# Patient Record
Sex: Female | Born: 1950 | Race: White | Hispanic: Yes | Marital: Single | Smoking: Never smoker
Health system: Southern US, Community
[De-identification: ages and names within clinical notes are randomized; demographics above are authoritative.]

## PROBLEM LIST (undated history)

## (undated) DIAGNOSIS — R7303 Prediabetes: Secondary | ICD-10-CM

## (undated) DIAGNOSIS — H269 Unspecified cataract: Secondary | ICD-10-CM

## (undated) DIAGNOSIS — E7800 Pure hypercholesterolemia, unspecified: Secondary | ICD-10-CM

## (undated) DIAGNOSIS — G4733 Obstructive sleep apnea (adult) (pediatric): Secondary | ICD-10-CM

## (undated) DIAGNOSIS — K5909 Other constipation: Secondary | ICD-10-CM

## (undated) DIAGNOSIS — K648 Other hemorrhoids: Secondary | ICD-10-CM

## (undated) DIAGNOSIS — K5790 Diverticulosis of intestine, part unspecified, without perforation or abscess without bleeding: Secondary | ICD-10-CM

## (undated) DIAGNOSIS — K759 Inflammatory liver disease, unspecified: Secondary | ICD-10-CM

## (undated) DIAGNOSIS — K5792 Diverticulitis of intestine, part unspecified, without perforation or abscess without bleeding: Secondary | ICD-10-CM

## (undated) DIAGNOSIS — K449 Diaphragmatic hernia without obstruction or gangrene: Secondary | ICD-10-CM

## (undated) DIAGNOSIS — K760 Fatty (change of) liver, not elsewhere classified: Secondary | ICD-10-CM

## (undated) DIAGNOSIS — K279 Peptic ulcer, site unspecified, unspecified as acute or chronic, without hemorrhage or perforation: Secondary | ICD-10-CM

## (undated) DIAGNOSIS — I10 Essential (primary) hypertension: Secondary | ICD-10-CM

## (undated) DIAGNOSIS — K219 Gastro-esophageal reflux disease without esophagitis: Secondary | ICD-10-CM

## (undated) DIAGNOSIS — G4730 Sleep apnea, unspecified: Secondary | ICD-10-CM

## (undated) DIAGNOSIS — M1990 Unspecified osteoarthritis, unspecified site: Secondary | ICD-10-CM

## (undated) DIAGNOSIS — K626 Ulcer of anus and rectum: Secondary | ICD-10-CM

## (undated) HISTORY — DX: Sleep apnea, unspecified: G47.30

## (undated) HISTORY — DX: Diverticulosis of intestine, part unspecified, without perforation or abscess without bleeding: K57.90

## (undated) HISTORY — DX: Obstructive sleep apnea (adult) (pediatric): G47.33

## (undated) HISTORY — DX: Fatty (change of) liver, not elsewhere classified: K76.0

## (undated) HISTORY — PX: ABDOMINAL HYSTERECTOMY: SHX81

## (undated) HISTORY — DX: Peptic ulcer, site unspecified, unspecified as acute or chronic, without hemorrhage or perforation: K27.9

## (undated) HISTORY — DX: Other constipation: K59.09

## (undated) HISTORY — DX: Unspecified cataract: H26.9

## (undated) HISTORY — PX: HEMORRHOID BANDING: SHX5850

## (undated) HISTORY — DX: Diverticulitis of intestine, part unspecified, without perforation or abscess without bleeding: K57.92

## (undated) HISTORY — DX: Gastro-esophageal reflux disease without esophagitis: K21.9

## (undated) HISTORY — DX: Diaphragmatic hernia without obstruction or gangrene: K44.9

## (undated) HISTORY — DX: Other hemorrhoids: K64.8

## (undated) HISTORY — DX: Unspecified osteoarthritis, unspecified site: M19.90

## (undated) HISTORY — DX: Ulcer of anus and rectum: K62.6

## (undated) HISTORY — PX: TUBAL LIGATION: SHX77

---

## 2003-10-01 ENCOUNTER — Ambulatory Visit (HOSPITAL_COMMUNITY): Admission: RE | Admit: 2003-10-01 | Discharge: 2003-10-01 | Payer: Self-pay | Admitting: Family Medicine

## 2003-10-14 ENCOUNTER — Ambulatory Visit: Payer: Self-pay | Admitting: Family Medicine

## 2003-10-15 ENCOUNTER — Ambulatory Visit: Payer: Self-pay | Admitting: *Deleted

## 2003-10-29 ENCOUNTER — Ambulatory Visit: Payer: Self-pay | Admitting: Family Medicine

## 2003-11-04 ENCOUNTER — Ambulatory Visit (HOSPITAL_COMMUNITY): Admission: RE | Admit: 2003-11-04 | Discharge: 2003-11-04 | Payer: Self-pay | Admitting: Family Medicine

## 2003-12-11 ENCOUNTER — Ambulatory Visit: Payer: Self-pay | Admitting: Family Medicine

## 2003-12-18 ENCOUNTER — Ambulatory Visit: Payer: Self-pay | Admitting: Family Medicine

## 2003-12-18 ENCOUNTER — Ambulatory Visit (HOSPITAL_COMMUNITY): Admission: RE | Admit: 2003-12-18 | Discharge: 2003-12-18 | Payer: Self-pay | Admitting: Family Medicine

## 2004-01-18 ENCOUNTER — Ambulatory Visit: Payer: Self-pay | Admitting: Family Medicine

## 2004-03-21 ENCOUNTER — Ambulatory Visit: Payer: Self-pay | Admitting: Family Medicine

## 2004-04-18 ENCOUNTER — Ambulatory Visit: Payer: Self-pay | Admitting: Family Medicine

## 2004-04-27 ENCOUNTER — Ambulatory Visit: Payer: Self-pay | Admitting: Family Medicine

## 2004-12-14 ENCOUNTER — Ambulatory Visit: Payer: Self-pay | Admitting: Internal Medicine

## 2005-01-01 ENCOUNTER — Emergency Department (HOSPITAL_COMMUNITY): Admission: EM | Admit: 2005-01-01 | Discharge: 2005-01-01 | Payer: Self-pay | Admitting: Emergency Medicine

## 2005-02-03 ENCOUNTER — Ambulatory Visit (HOSPITAL_COMMUNITY): Admission: RE | Admit: 2005-02-03 | Discharge: 2005-02-03 | Payer: Self-pay | Admitting: Family Medicine

## 2005-05-09 ENCOUNTER — Ambulatory Visit: Payer: Self-pay | Admitting: Family Medicine

## 2005-05-09 ENCOUNTER — Encounter (INDEPENDENT_AMBULATORY_CARE_PROVIDER_SITE_OTHER): Payer: Self-pay | Admitting: Family Medicine

## 2005-05-09 LAB — CONVERTED CEMR LAB: Pap Smear: NORMAL

## 2005-08-08 ENCOUNTER — Ambulatory Visit: Payer: Self-pay | Admitting: Family Medicine

## 2005-09-14 ENCOUNTER — Ambulatory Visit: Payer: Self-pay | Admitting: *Deleted

## 2005-12-05 ENCOUNTER — Ambulatory Visit: Payer: Self-pay | Admitting: Family Medicine

## 2006-01-26 ENCOUNTER — Ambulatory Visit: Payer: Self-pay | Admitting: Family Medicine

## 2006-03-09 ENCOUNTER — Ambulatory Visit: Payer: Self-pay | Admitting: Family Medicine

## 2006-05-19 ENCOUNTER — Emergency Department (HOSPITAL_COMMUNITY): Admission: EM | Admit: 2006-05-19 | Discharge: 2006-05-19 | Payer: Self-pay | Admitting: Emergency Medicine

## 2006-07-11 ENCOUNTER — Encounter (INDEPENDENT_AMBULATORY_CARE_PROVIDER_SITE_OTHER): Payer: Self-pay | Admitting: Family Medicine

## 2006-07-11 DIAGNOSIS — Z8619 Personal history of other infectious and parasitic diseases: Secondary | ICD-10-CM | POA: Insufficient documentation

## 2006-07-11 DIAGNOSIS — K219 Gastro-esophageal reflux disease without esophagitis: Secondary | ICD-10-CM

## 2006-07-11 DIAGNOSIS — E7800 Pure hypercholesterolemia, unspecified: Secondary | ICD-10-CM

## 2006-07-11 DIAGNOSIS — M545 Low back pain: Secondary | ICD-10-CM

## 2006-07-11 DIAGNOSIS — I10 Essential (primary) hypertension: Secondary | ICD-10-CM

## 2006-07-31 ENCOUNTER — Ambulatory Visit: Payer: Self-pay | Admitting: Family Medicine

## 2006-08-08 ENCOUNTER — Ambulatory Visit: Payer: Self-pay | Admitting: Family Medicine

## 2006-10-17 ENCOUNTER — Encounter (INDEPENDENT_AMBULATORY_CARE_PROVIDER_SITE_OTHER): Payer: Self-pay | Admitting: *Deleted

## 2007-01-15 ENCOUNTER — Ambulatory Visit: Payer: Self-pay | Admitting: Family Medicine

## 2007-01-15 LAB — CONVERTED CEMR LAB
ALT: 28 units/L (ref 0–35)
Cholesterol: 201 mg/dL — ABNORMAL HIGH (ref 0–200)
HDL: 54 mg/dL (ref >39)
LDL Cholesterol: 107 mg/dL — ABNORMAL HIGH (ref 0–99)
Total CHOL/HDL Ratio: 3.7
Triglycerides: 198 mg/dL — ABNORMAL HIGH (ref <150)
VLDL: 40 mg/dL (ref 0–40)

## 2007-01-16 ENCOUNTER — Ambulatory Visit (HOSPITAL_COMMUNITY): Admission: RE | Admit: 2007-01-16 | Discharge: 2007-01-16 | Payer: Self-pay | Admitting: Family Medicine

## 2007-07-11 ENCOUNTER — Ambulatory Visit: Payer: Self-pay | Admitting: Internal Medicine

## 2007-10-05 ENCOUNTER — Emergency Department (HOSPITAL_COMMUNITY): Admission: EM | Admit: 2007-10-05 | Discharge: 2007-10-05 | Payer: Self-pay | Admitting: Emergency Medicine

## 2007-12-03 ENCOUNTER — Ambulatory Visit: Payer: Self-pay | Admitting: Family Medicine

## 2007-12-13 ENCOUNTER — Encounter (INDEPENDENT_AMBULATORY_CARE_PROVIDER_SITE_OTHER): Payer: Self-pay | Admitting: Family Medicine

## 2007-12-13 ENCOUNTER — Ambulatory Visit: Payer: Self-pay | Admitting: Internal Medicine

## 2007-12-13 LAB — CONVERTED CEMR LAB
ALT: 28 units/L (ref 0–35)
AST: 20 units/L (ref 0–37)
Albumin: 4.7 g/dL (ref 3.5–5.2)
Alkaline Phosphatase: 71 units/L (ref 39–117)
BUN: 12 mg/dL (ref 6–23)
CO2: 25 meq/L (ref 19–32)
Calcium: 9.5 mg/dL (ref 8.4–10.5)
Chloride: 100 meq/L (ref 96–112)
Cholesterol: 173 mg/dL (ref 0–200)
Creatinine, Ser: 0.82 mg/dL (ref 0.40–1.20)
Glucose, Bld: 90 mg/dL (ref 70–99)
HDL: 61 mg/dL (ref >39)
LDL Cholesterol: 76 mg/dL (ref 0–99)
Potassium: 4.3 meq/L (ref 3.5–5.3)
Sodium: 139 meq/L (ref 135–145)
TSH: 1.517 microintl units/mL (ref 0.350–4.50)
Total Bilirubin: 0.5 mg/dL (ref 0.3–1.2)
Total CHOL/HDL Ratio: 2.8
Total Protein: 8.1 g/dL (ref 6.0–8.3)
Triglycerides: 180 mg/dL — ABNORMAL HIGH (ref <150)
VLDL: 36 mg/dL (ref 0–40)
Vit D, 1,25-Dihydroxy: 19 — ABNORMAL LOW (ref 30–89)

## 2008-03-19 ENCOUNTER — Ambulatory Visit (HOSPITAL_COMMUNITY): Admission: RE | Admit: 2008-03-19 | Discharge: 2008-03-19 | Payer: Self-pay | Admitting: Family Medicine

## 2008-03-26 ENCOUNTER — Encounter: Admission: RE | Admit: 2008-03-26 | Discharge: 2008-03-26 | Payer: Self-pay | Admitting: Family Medicine

## 2008-08-04 ENCOUNTER — Ambulatory Visit: Payer: Self-pay | Admitting: Family Medicine

## 2008-08-06 ENCOUNTER — Ambulatory Visit (HOSPITAL_COMMUNITY): Admission: RE | Admit: 2008-08-06 | Discharge: 2008-08-06 | Payer: Self-pay | Admitting: Family Medicine

## 2008-08-13 ENCOUNTER — Ambulatory Visit: Payer: Self-pay | Admitting: Family Medicine

## 2008-10-22 ENCOUNTER — Ambulatory Visit: Payer: Self-pay | Admitting: Family Medicine

## 2008-10-22 ENCOUNTER — Encounter (INDEPENDENT_AMBULATORY_CARE_PROVIDER_SITE_OTHER): Payer: Self-pay | Admitting: Family Medicine

## 2008-10-22 LAB — CONVERTED CEMR LAB
Basophils Absolute: 0.1 10*3/uL (ref 0.0–0.1)
Basophils Relative: 1 % (ref 0–1)
Cholesterol: 176 mg/dL (ref 0–200)
Eosinophils Absolute: 0.1 10*3/uL (ref 0.0–0.7)
Eosinophils Relative: 2 % (ref 0–5)
HCT: 41.5 % (ref 36.0–46.0)
HDL: 56 mg/dL (ref >39)
Hemoglobin: 13.5 g/dL (ref 12.0–15.0)
LDL Cholesterol: 84 mg/dL (ref 0–99)
Lymphocytes Relative: 39 % (ref 12–46)
Lymphs Abs: 2.8 10*3/uL (ref 0.7–4.0)
MCHC: 32.5 g/dL (ref 30.0–36.0)
MCV: 89.4 fL (ref 78.0–100.0)
Monocytes Absolute: 0.5 10*3/uL (ref 0.1–1.0)
Monocytes Relative: 7 % (ref 3–12)
Neutro Abs: 3.6 10*3/uL (ref 1.7–7.7)
Neutrophils Relative %: 51 % (ref 43–77)
Platelets: 242 10*3/uL (ref 150–400)
RBC: 4.64 M/uL (ref 3.87–5.11)
RDW: 13.5 % (ref 11.5–15.5)
Total CHOL/HDL Ratio: 3.1
Triglycerides: 181 mg/dL — ABNORMAL HIGH (ref <150)
VLDL: 36 mg/dL (ref 0–40)
Vit D, 25-Hydroxy: 28 ng/mL — ABNORMAL LOW (ref 30–89)
WBC: 7.1 10*3/uL (ref 4.0–10.5)

## 2008-11-17 ENCOUNTER — Ambulatory Visit: Payer: Self-pay | Admitting: Internal Medicine

## 2009-02-17 ENCOUNTER — Emergency Department (HOSPITAL_COMMUNITY): Admission: EM | Admit: 2009-02-17 | Discharge: 2009-02-17 | Payer: Self-pay | Admitting: Family Medicine

## 2009-03-05 ENCOUNTER — Ambulatory Visit: Payer: Self-pay | Admitting: Family Medicine

## 2009-03-05 LAB — CONVERTED CEMR LAB
ALT: 26 units/L (ref 0–35)
AST: 21 units/L (ref 0–37)
Albumin: 4.5 g/dL (ref 3.5–5.2)
Alkaline Phosphatase: 72 units/L (ref 39–117)
BUN: 14 mg/dL (ref 6–23)
Basophils Absolute: 0.1 10*3/uL (ref 0.0–0.1)
Basophils Relative: 1 % (ref 0–1)
CO2: 22 meq/L (ref 19–32)
Calcium: 9.5 mg/dL (ref 8.4–10.5)
Chloride: 102 meq/L (ref 96–112)
Creatinine, Ser: 0.8 mg/dL (ref 0.40–1.20)
Eosinophils Absolute: 0.2 10*3/uL (ref 0.0–0.7)
Eosinophils Relative: 2 % (ref 0–5)
Glucose, Bld: 104 mg/dL — ABNORMAL HIGH (ref 70–99)
HCT: 41.6 % (ref 36.0–46.0)
Hemoglobin: 13.7 g/dL (ref 12.0–15.0)
Lymphocytes Relative: 47 % — ABNORMAL HIGH (ref 12–46)
Lymphs Abs: 3.7 10*3/uL (ref 0.7–4.0)
MCHC: 32.9 g/dL (ref 30.0–36.0)
MCV: 88.1 fL (ref 78.0–100.0)
Monocytes Absolute: 0.5 10*3/uL (ref 0.1–1.0)
Monocytes Relative: 6 % (ref 3–12)
Neutro Abs: 3.5 10*3/uL (ref 1.7–7.7)
Neutrophils Relative %: 45 % (ref 43–77)
Platelets: 250 10*3/uL (ref 150–400)
Potassium: 4 meq/L (ref 3.5–5.3)
RBC: 4.72 M/uL (ref 3.87–5.11)
RDW: 12.5 % (ref 11.5–15.5)
Sed Rate: 30 mm/hr — ABNORMAL HIGH (ref 0–22)
Sodium: 138 meq/L (ref 135–145)
Total Bilirubin: 0.3 mg/dL (ref 0.3–1.2)
Total Protein: 7.9 g/dL (ref 6.0–8.3)
WBC: 7.9 10*3/uL (ref 4.0–10.5)

## 2009-03-10 ENCOUNTER — Ambulatory Visit (HOSPITAL_COMMUNITY): Admission: RE | Admit: 2009-03-10 | Discharge: 2009-03-10 | Payer: Self-pay | Admitting: Family Medicine

## 2009-03-12 ENCOUNTER — Ambulatory Visit: Payer: Self-pay | Admitting: Family Medicine

## 2009-04-19 ENCOUNTER — Ambulatory Visit: Payer: Self-pay | Admitting: Internal Medicine

## 2009-04-19 ENCOUNTER — Encounter (INDEPENDENT_AMBULATORY_CARE_PROVIDER_SITE_OTHER): Payer: Self-pay | Admitting: *Deleted

## 2009-04-19 DIAGNOSIS — R1031 Right lower quadrant pain: Secondary | ICD-10-CM

## 2009-04-19 DIAGNOSIS — R198 Other specified symptoms and signs involving the digestive system and abdomen: Secondary | ICD-10-CM

## 2009-04-19 DIAGNOSIS — K625 Hemorrhage of anus and rectum: Secondary | ICD-10-CM | POA: Insufficient documentation

## 2009-04-27 ENCOUNTER — Encounter (INDEPENDENT_AMBULATORY_CARE_PROVIDER_SITE_OTHER): Payer: Self-pay | Admitting: *Deleted

## 2009-04-27 ENCOUNTER — Ambulatory Visit: Payer: Self-pay | Admitting: Internal Medicine

## 2009-04-27 DIAGNOSIS — K626 Ulcer of anus and rectum: Secondary | ICD-10-CM

## 2009-04-27 HISTORY — DX: Ulcer of anus and rectum: K62.6

## 2009-04-27 HISTORY — PX: COLONOSCOPY: SHX174

## 2009-05-03 ENCOUNTER — Encounter: Payer: Self-pay | Admitting: Internal Medicine

## 2009-07-28 ENCOUNTER — Telehealth: Payer: Self-pay | Admitting: Internal Medicine

## 2009-07-28 DIAGNOSIS — R1011 Right upper quadrant pain: Secondary | ICD-10-CM | POA: Insufficient documentation

## 2009-07-30 ENCOUNTER — Ambulatory Visit (HOSPITAL_COMMUNITY): Admission: RE | Admit: 2009-07-30 | Discharge: 2009-07-30 | Payer: Self-pay | Admitting: Internal Medicine

## 2009-10-01 ENCOUNTER — Encounter (INDEPENDENT_AMBULATORY_CARE_PROVIDER_SITE_OTHER): Payer: Self-pay | Admitting: Internal Medicine

## 2009-10-01 ENCOUNTER — Ambulatory Visit: Payer: Self-pay | Admitting: Family Medicine

## 2009-10-01 LAB — CONVERTED CEMR LAB
ALT: 20 units/L (ref 0–35)
AST: 21 units/L (ref 0–37)
Albumin: 4.4 g/dL (ref 3.5–5.2)
Alkaline Phosphatase: 64 units/L (ref 39–117)
BUN: 13 mg/dL (ref 6–23)
Basophils Absolute: 0.1 10*3/uL (ref 0.0–0.1)
Basophils Relative: 1 % (ref 0–1)
Bilirubin, Direct: 0.1 mg/dL (ref 0.0–0.3)
CO2: 25 meq/L (ref 19–32)
Calcium: 9.6 mg/dL (ref 8.4–10.5)
Chloride: 103 meq/L (ref 96–112)
Creatinine, Ser: 0.93 mg/dL (ref 0.40–1.20)
Eosinophils Absolute: 0.1 10*3/uL (ref 0.0–0.7)
Eosinophils Relative: 2 % (ref 0–5)
Glucose, Bld: 110 mg/dL — ABNORMAL HIGH (ref 70–99)
HCT: 40.7 % (ref 36.0–46.0)
Hemoglobin: 13.1 g/dL (ref 12.0–15.0)
Indirect Bilirubin: 0.4 mg/dL (ref 0.0–0.9)
Lymphocytes Relative: 37 % (ref 12–46)
Lymphs Abs: 2.9 10*3/uL (ref 0.7–4.0)
MCHC: 32.2 g/dL (ref 30.0–36.0)
MCV: 88.7 fL (ref 78.0–100.0)
Monocytes Absolute: 0.4 10*3/uL (ref 0.1–1.0)
Monocytes Relative: 5 % (ref 3–12)
Neutro Abs: 4.5 10*3/uL (ref 1.7–7.7)
Neutrophils Relative %: 56 % (ref 43–77)
Platelets: 235 10*3/uL (ref 150–400)
Potassium: 4.2 meq/L (ref 3.5–5.3)
RBC: 4.59 M/uL (ref 3.87–5.11)
RDW: 13.7 % (ref 11.5–15.5)
Sodium: 140 meq/L (ref 135–145)
Total Bilirubin: 0.5 mg/dL (ref 0.3–1.2)
Total Protein: 7.6 g/dL (ref 6.0–8.3)
WBC: 8.1 10*3/uL (ref 4.0–10.5)

## 2009-10-07 ENCOUNTER — Emergency Department (HOSPITAL_COMMUNITY): Admission: EM | Admit: 2009-10-07 | Discharge: 2009-10-07 | Payer: Self-pay | Admitting: Family Medicine

## 2010-02-01 ENCOUNTER — Encounter (INDEPENDENT_AMBULATORY_CARE_PROVIDER_SITE_OTHER): Payer: Self-pay | Admitting: Family Medicine

## 2010-02-01 LAB — CONVERTED CEMR LAB
ALT: 21 units/L (ref 0–35)
Cholesterol: 210 mg/dL — ABNORMAL HIGH (ref 0–200)
HDL: 65 mg/dL (ref >39)
LDL Cholesterol: 120 mg/dL — ABNORMAL HIGH (ref 0–99)
TSH: 1.397 microintl units/mL (ref 0.350–4.500)
Total CHOL/HDL Ratio: 3.2
Triglycerides: 126 mg/dL (ref <150)
VLDL: 25 mg/dL (ref 0–40)

## 2010-03-02 NOTE — Letter (Signed)
Summary: [facility] Instructions  [facility]  [address], [place] [address]   Phone: [phone]  Fax: [phone]       Julie Dougherty    March 24, 1950    MRN: [id number]      Procedure Day Dorna Bloom: Julie Dougherty, 04/27/09     Arrival Time: 10:30 AM      Procedure Time: 11:30 AM    Location of Procedure:                    _X_   Endoscopy Center (4th Floor)                      PREPARATION FOR COLONOSCOPY WITH MOVIPREP   Starting 5 days prior to your procedure 04/22/09 do not eat nuts, seeds, popcorn, corn, beans, peas,  salads, or any raw vegetables.  Do not take any fiber supplements (e.g. Metamucil, Citrucel, and Benefiber).  THE DAY BEFORE YOUR PROCEDURE         MONDAY, 04/26/09  1.  Drink clear liquids the entire day-NO SOLID FOOD  2.  Do not drink anything colored red or purple.  Avoid juices with pulp.  No orange juice.  3.  Drink at least 64 oz. (8 glasses) of fluid/clear liquids during the day to prevent dehydration and help the prep work efficiently.  CLEAR LIQUIDS INCLUDE: Water Jello Ice Popsicles Tea (sugar ok, no milk/cream) Powdered fruit flavored drinks Coffee (sugar ok, no milk/cream) Gatorade Juice: apple, white grape, white cranberry  Lemonade Clear bullion, consomm, broth Carbonated beverages (any kind) Strained chicken noodle soup Hard Candy                             4.  In the morning, mix first dose of MoviPrep solution:    Empty 1 Pouch A and 1 Pouch B into the disposable container    Add lukewarm drinking water to the top line of the container. Mix to dissolve    Refrigerate (mixed solution should be used within 24 hrs)  5.  Begin drinking the prep at 5:00 p.m. The MoviPrep container is divided by 4 marks.   Every 15 minutes drink the solution down to the next mark (approximately 8 oz) until the full liter is complete.   6.  Follow completed prep with 16 oz of clear liquid of your choice (Nothing red or purple).   Continue to drink clear liquids until bedtime.  7.  Before going to bed, mix second dose of MoviPrep solution:    Empty 1 Pouch A and 1 Pouch B into the disposable container    Add lukewarm drinking water to the top line of the container. Mix to dissolve    Refrigerate  THE DAY OF YOUR PROCEDURE      TUESDAY, 04/27/09  Beginning at 6:30a.m. (5 hours before procedure):         1. Every 15 minutes, drink the solution down to the next mark (approx 8 oz) until the full liter is complete.  2. Follow completed prep with 16 oz. of clear liquid of your choice.    3. You may drink clear liquids until 9:30 AM (2 HOURS BEFORE PROCEDURE).   MEDICATION INSTRUCTIONS  Unless otherwise instructed, you should take regular prescription medications with a small sip of water   as early as possible the morning of your procedure.       OTHER INSTRUCTIONS  You will need a responsible adult at least 59 years of age to accompany you and drive you home.   This person must remain in the waiting room during your procedure.  Wear loose fitting clothing that is easily removed.  Leave jewelry and other valuables at home.  However, you may wish to bring a book to read or  an iPod/MP3 player to listen to music as you wait for your procedure to start.  Remove all body piercing jewelry and leave at home.  Total time from sign-in until discharge is approximately 2-3 hours.  You should go home directly after your procedure and rest.  You can resume normal activities the  day after your procedure.  The day of your procedure you should not:   Drive   Make legal decisions   Operate machinery   Drink alcohol   Return to work  You will receive specific instructions about eating, activities and medications before you leave.  The above instructions have been reviewed and explained to me by   _______________________  I fully understand and can verbalize these instructions  _____________________________ Date _________

## 2010-03-02 NOTE — Letter (Signed)
Summary: Patient Notice- Colon Biospy Results  [facility]  [address] [place], [place] [address]   Phone: [phone]  Fax: [phone]        May 03, 2009 MRN: [id number]    Julie Dougherty [address] [place], [place]  [address]    Dear [name],  I am pleased to inform you that the biopsies taken during your recent colonoscopy did not show any evidence of cancer upon pathologic examination.  The biopsy demonstrated inflammation changes in your rectum that are related to your constipation and straining to stool. Increasing intake of dietary fiber and using MiraLax as recommended should help this.   You should have a repeat colonoscopy examination to screen for colorectal cancer in 10 years.  In addition to repeating colonoscopy, changing health habits may reduce your risk of having colon polyps and possibly, colon cancer. You may lower your risk of future polyps and colon cancer by adopting healthy habits such as not smoking or using tobacco (if you do), being physically active, losing weight (if overweight), and eating a diet which includes fruits and vegetables and limits red meat.  Please call us if you are having persistent problems or have questions about your condition that have not been fully answered at this time.  Sincerely,  Iva Boop MD, [facility]   This letter has been electronically signed by your physician.  Appended Document: Patient Notice- Colon Biospy Results Letter mailed 4.5.11

## 2010-03-02 NOTE — Progress Notes (Signed)
Summary: triage  Phone Note Call from Patient   Caller: daughter in law ES Call For: Leone Payor Reason for Call: Talk to Nurse Summary of Call: Patient wants to know if she can get that ultrasound schedued that Dr Leone Payor had mentioned to her, states that she is still having right side pain that radiates to her back and she has been  doing the miralax and diet he had told her. Initial call taken by: Leanor Kail [facility],  July 28, 2009 8:49 AM  Follow-up for Phone Call        Dr Leone Payor per your colon note you were going to consider [place].  Please advise if ok to set up.   Follow-up by: Darcey Nora RN, CGRN,  July 28, 2009 9:00 AM  Additional Follow-up for Phone Call Additional follow up Details #1::        schedule abdominal US re: RUQ pain Additional Follow-up by: Iva Boop MD, [facility],  July 28, 2009 10:42 AM  New Problems: RUQ PAIN (ICD-789.01)   Additional Follow-up for Phone Call Additional follow up Details #2::    Patient  is scheduled for 07/30/09 8:00 .  ES is aware. Follow-up by: Darcey Nora RN, CGRN,  July 28, 2009 10:53 AM  New Problems: RUQ PAIN (ICD-789.01)

## 2010-03-02 NOTE — Procedures (Signed)
Summary: Colonoscopy  Patient: Julie Dougherty Note: All result statuses are Final unless otherwise noted.  Tests: (1) Colonoscopy (COL)   COL Colonoscopy           DONE     [facility]     [address]     [place], [place]  [address]           COLONOSCOPY PROCEDURE REPORT           PATIENT:  [name], [name]  MR#:  [id number]     BIRTHDATE:  30-Oct-1950, 59 yrs. old  GENDER:  female     ENDOSCOPIST:  Iva Boop, MD, [facility]     REF. BY:     PROCEDURE DATE:  04/27/2009     PROCEDURE:  Colonoscopy with biopsy     ASA CLASS:  Class II     INDICATIONS:  rectal bleeding, change in bowel habits     MEDICATIONS:   Fentanyl 50 mcg IV, Versed 5 mg IV           DESCRIPTION OF PROCEDURE:   After the risks benefits and     alternatives of the procedure were thoroughly explained, informed     consent was obtained.  Digital rectal exam was performed and     revealed no abnormalities.   The LB CF-[id number] endoscope     was introduced through the anus and advanced to the cecum, which     was identified by both the appendix and ileocecal valve, without     limitations.  The quality of the prep was excellent, using     MoviPrep.  The instrument was then slowly withdrawn as the colon     was fully examined. Insertion: 2:42 minutes Withdrawal: 8:46     minutes.     <<PROCEDUREIMAGES>>           FINDINGS:  Moderate diverticulosis was found in the sigmoid colon.     Abnormal appearing mucosa in the rectum. Irregular mucosa at     rectal-hemorrhoidal interface seen on retroflex. With standard     forceps, biopsy was obtained and sent to pathology.  This was     otherwise a normal examination of the colon.   Retroflexed views     in the rectum revealed internal hemorrhoids.   The scope was then     withdrawn from the patient and the procedure completed.           COMPLICATIONS:  None     ENDOSCOPIC IMPRESSION:     1) Moderate diverticulosis in the sigmoid colon     2) Abnormal  mucosa in the rectum - irregularity at     rectal-hemorrhoidal interface - biopsied     3) Internal hemorrhoids     4) Otherwise normal examination, excellent prep           RECOMMENDATIONS:     1) high fiber diet     2) MiraLax if high fiber is not helpful.     3) she could need a gallbladder ultrasound - await pathology I     will notify.     4) she may bleed a bit more than usual some from the rectal biopsy           REPEAT EXAM:  In for Colonoscopy, pending biopsy results.           Iva Boop, MD, [facility]           CC:  Dow Adolph, MD     The Patient           n.     eSIGNED:   Iva Boop at 04/27/2009 12:05 PM           Leta Jungling, [id number]  Note: An exclamation mark (!) indicates a result that was not dispersed into the flowsheet. Document Creation Date: 04/27/2009 12:06 PM _______________________________________________________________________  (1) Order result status: Final Collection or observation date-time: 04/27/2009 11:55 Requested date-time:  Receipt date-time:  Reported date-time:  Referring Physician:   Ordering Physician: Stan Head [id number]) Specimen Source:  Source: Launa Grill Order Number: [id number] Lab site:   Appended Document: Colonoscopy     Procedures Next Due Date:    Colonoscopy: 05/2019

## 2010-03-02 NOTE — Assessment & Plan Note (Signed)
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 She describes years of RUQ-RLQ pain radiating to back She also has thin bowel movements x yrs and bleeding x few yrs. Red blood into the toilet. Associates problems with prior hysterectomy, at least some. Also describes "alot of gas".  daughter in law interprets Jorene Guest)   GI Review of Systems    Reports abdominal pain and  bloating.     Location of  Abdominal pain: right side.    Denies acid reflux, belching, chest pain, dysphagia with liquids, dysphagia with solids, heartburn, loss of appetite, nausea, vomiting, vomiting blood, weight loss, and  weight gain.      Reports change in bowel habits and  rectal bleeding.     Denies anal fissure, black tarry stools, constipation, diarrhea, diverticulosis, fecal incontinence, heme positive stool, hemorrhoids, irritable bowel syndrome, jaundice, light color stool, liver problems, and  rectal pain.    Current Medications (verified): 1)  Protonix 40 Mg Tbec (Pantoprazole Sodium) .... One Tablet By Mouth Two Times A Day 2)  Avapro 150 Mg Tabs (Irbesartan) .Marland Kitchen.. 1 By Mouth Once Daily 3)  Zetia 10 Mg Tabs (Ezetimibe) .Marland Kitchen.. 1 By Mouth Every Pm 4)  Aspirin 325 Mg  Tabs (Aspirin) .... One Tablet By Mouth Once Daily 5)  Niaspan 1000 Mg Cr-Tabs (Niacin (Antihyperlipidemic)) .... One Tablet By Mouth At Bedtime  Allergies (verified): 1)  ! * Zocor, Pravastatin  Past History:  Past Medical History: Hyperlipidemia Hypertension Peptic Ulcer Disease  Past Surgical History: Reviewed history from 07/11/2006 and no changes required. S/P Hysterectomy (fibroids)   (1996)  Family History: No  FH of Colon Cancer:  Social History: Occupation: Part Time custodial Widowed  Three childern  Patient has never smoked.  Alcohol Use - no Daily Caffeine Use: 4 daily  Illicit Drug Use - no Smoking Status:  never Drug Use:  no  Review of Systems       The patient complains of back pain, change in vision, and cough.         All other ROS negative except as per HPI.   Vital Signs:  Patient profile:   60 year old female Height:      62 inches Weight:      191 pounds BMI:     35.06 BSA:     1.88 Pulse rate:   76 / minute Pulse rhythm:   regular BP sitting:   132 / 84  (left arm) Cuff size:   regular  Vitals Entered By: Ok Anis CMA (April 19, 2009 3:54 PM)  Physical Exam  General:  obese.   Eyes:  PERRLA, no icterus. Mouth:  No deformity or lesions, . Neck:  Supple; no masses or thyromegaly. Lungs:  Clear throughout to auscultation. Heart:  Regular rate and rhythm; no murmurs, rubs,  or bruits. Abdomen:  obese, soft and nontender without HSM/mass Rectal:  deferred until time of colonoscopy.   Extremities:  No clubbing, cyanosis, edema or deformities noted. Cervical Nodes:  No significant cervical or supraclavicular adenopathy.    Impression & Recommendations:  Problem # 1:  RECTAL BLEEDING (ICD-569.3) Assessment New Etilogy unclear. Ano-rectal likely but colorectal neoplasia is possible. AVM's also.  Has never had a colonoscopy. Orders: Colonoscopy (Colon)  Problem # 2:  CHANGE IN BOWELS (ZOX-096.04) Assessment: New Relatively chronic, which is reassuring, but no prior colonoscopy. Orders: Colonoscopy (Colon)  Problem # 3:  ABDOMINAL PAIN, RIGHT LOWER QUADRANT (ICD-789.03) Assessment: New ? relation to lumbar discs chronic and daily some better w bms so GI etiology also possible await colonoscopy ? antispasmodic needed  Patient Instructions: 1)  We will see you at your procedure on 04/27/09. 2)  The medication list was reviewed and reconciled.  All  changed / newly prescribed medications were explained.  A complete medication list was provided to the patient / caregiver. Prescriptions: MOVIPREP 100 GM  SOLR (PEG-KCL-NACL-NASULF-NA ASC-C) As per prep instructions.  #1 x 0   Entered by:   Francee Piccolo CMA (AAMA)   Authorized by:   Iva Boop MD, [facility]   Signed by:   Francee Piccolo CMA (AAMA) on 04/19/2009   Method used:   Samples Given   RxID:   [id number]

## 2010-03-02 NOTE — Letter (Signed)
Summary: Out of Work  [facility]  [address] [place], [place] [address]   Phone: [phone]  Fax: [phone]    April 27, 2009   Employee:  HERAN CAMPAU    To Whom It May Concern:   For Medical reasons, please excuse the above named employee from work for the following dates:  Start:   April 27, 2009  End:   April 28, 2009  Ms. Raoul Pitch may return to work on April 28, 2009.  If you need additional information, please feel free to contact our office.         Sincerely,    Francee Piccolo CMA (AAMA)

## 2010-04-18 LAB — WOUND CULTURE

## 2010-09-21 ENCOUNTER — Other Ambulatory Visit: Payer: Self-pay | Admitting: Internal Medicine

## 2010-09-21 DIAGNOSIS — R109 Unspecified abdominal pain: Secondary | ICD-10-CM

## 2010-09-26 ENCOUNTER — Ambulatory Visit (HOSPITAL_COMMUNITY)
Admission: RE | Admit: 2010-09-26 | Discharge: 2010-09-26 | Disposition: A | Discharge status: Home / Self Care | Payer: Self-pay | Source: Ambulatory Visit | Attending: Internal Medicine | Admitting: Internal Medicine

## 2010-09-26 ENCOUNTER — Encounter (HOSPITAL_COMMUNITY): Payer: Self-pay

## 2010-09-26 DIAGNOSIS — K5730 Diverticulosis of large intestine without perforation or abscess without bleeding: Secondary | ICD-10-CM | POA: Insufficient documentation

## 2010-09-26 DIAGNOSIS — K449 Diaphragmatic hernia without obstruction or gangrene: Secondary | ICD-10-CM | POA: Insufficient documentation

## 2010-09-26 DIAGNOSIS — R109 Unspecified abdominal pain: Secondary | ICD-10-CM | POA: Insufficient documentation

## 2010-09-26 DIAGNOSIS — R1011 Right upper quadrant pain: Secondary | ICD-10-CM | POA: Insufficient documentation

## 2010-09-26 HISTORY — DX: Pure hypercholesterolemia, unspecified: E78.00

## 2010-09-26 HISTORY — DX: Essential (primary) hypertension: I10

## 2010-09-26 MED ORDER — IOHEXOL 300 MG/ML  SOLN
80.0000 mL | Freq: Once | INTRAMUSCULAR | Status: AC | PRN
  Administered 2010-09-26: 80 mL via INTRAVENOUS

## 2011-03-28 ENCOUNTER — Encounter (HOSPITAL_COMMUNITY): Payer: Self-pay | Admitting: *Deleted

## 2011-03-28 ENCOUNTER — Emergency Department (HOSPITAL_COMMUNITY): Payer: Self-pay

## 2011-03-28 ENCOUNTER — Emergency Department (INDEPENDENT_AMBULATORY_CARE_PROVIDER_SITE_OTHER)
Admission: EM | Admit: 2011-03-28 | Discharge: 2011-03-28 | Disposition: A | Discharge status: Nursing Home (Includes ICF) | Payer: Self-pay | Source: Home / Self Care | Attending: Family Medicine | Admitting: Family Medicine

## 2011-03-28 ENCOUNTER — Emergency Department (HOSPITAL_COMMUNITY)
Admission: EM | Admit: 2011-03-28 | Discharge: 2011-03-28 | Disposition: A | Discharge status: Home / Self Care | Payer: Self-pay | Attending: Emergency Medicine | Admitting: Emergency Medicine

## 2011-03-28 ENCOUNTER — Encounter (HOSPITAL_COMMUNITY): Payer: Self-pay

## 2011-03-28 DIAGNOSIS — E7800 Pure hypercholesterolemia, unspecified: Secondary | ICD-10-CM | POA: Insufficient documentation

## 2011-03-28 DIAGNOSIS — K5732 Diverticulitis of large intestine without perforation or abscess without bleeding: Secondary | ICD-10-CM | POA: Insufficient documentation

## 2011-03-28 DIAGNOSIS — K5792 Diverticulitis of intestine, part unspecified, without perforation or abscess without bleeding: Secondary | ICD-10-CM

## 2011-03-28 DIAGNOSIS — R1032 Left lower quadrant pain: Secondary | ICD-10-CM

## 2011-03-28 DIAGNOSIS — I10 Essential (primary) hypertension: Secondary | ICD-10-CM | POA: Insufficient documentation

## 2011-03-28 DIAGNOSIS — Z79899 Other long term (current) drug therapy: Secondary | ICD-10-CM | POA: Insufficient documentation

## 2011-03-28 LAB — URINALYSIS, ROUTINE W REFLEX MICROSCOPIC
Bilirubin Urine: NEGATIVE
Glucose, UA: NEGATIVE mg/dL
Hgb urine dipstick: NEGATIVE
Ketones, ur: NEGATIVE mg/dL
Nitrite: NEGATIVE
Protein, ur: NEGATIVE mg/dL
Specific Gravity, Urine: 1.009 (ref 1.005–1.030)
Urobilinogen, UA: 0.2 mg/dL (ref 0.0–1.0)
pH: 7 (ref 5.0–8.0)

## 2011-03-28 LAB — COMPREHENSIVE METABOLIC PANEL
ALT: 18 U/L (ref 0–35)
AST: 19 U/L (ref 0–37)
Albumin: 3.8 g/dL (ref 3.5–5.2)
Alkaline Phosphatase: 72 U/L (ref 39–117)
BUN: 13 mg/dL (ref 6–23)
CO2: 26 mEq/L (ref 19–32)
Calcium: 9.9 mg/dL (ref 8.4–10.5)
Chloride: 102 mEq/L (ref 96–112)
Creatinine, Ser: 0.83 mg/dL (ref 0.50–1.10)
GFR calc Af Amer: 88 mL/min — ABNORMAL LOW (ref >90)
GFR calc non Af Amer: 76 mL/min — ABNORMAL LOW (ref >90)
Glucose, Bld: 98 mg/dL (ref 70–99)
Potassium: 4 mEq/L (ref 3.5–5.1)
Sodium: 138 mEq/L (ref 135–145)
Total Bilirubin: 0.3 mg/dL (ref 0.3–1.2)
Total Protein: 8.1 g/dL (ref 6.0–8.3)

## 2011-03-28 LAB — URINE MICROSCOPIC-ADD ON

## 2011-03-28 LAB — CBC
HCT: 38.5 % (ref 36.0–46.0)
Hemoglobin: 12.7 g/dL (ref 12.0–15.0)
MCH: 29.1 pg (ref 26.0–34.0)
MCHC: 33 g/dL (ref 30.0–36.0)
MCV: 88.1 fL (ref 78.0–100.0)
Platelets: 230 10*3/uL (ref 150–400)
RBC: 4.37 MIL/uL (ref 3.87–5.11)
RDW: 13 % (ref 11.5–15.5)
WBC: 8.8 10*3/uL (ref 4.0–10.5)

## 2011-03-28 LAB — DIFFERENTIAL
Basophils Absolute: 0.1 10*3/uL (ref 0.0–0.1)
Basophils Relative: 1 % (ref 0–1)
Eosinophils Absolute: 0.2 10*3/uL (ref 0.0–0.7)
Eosinophils Relative: 2 % (ref 0–5)
Lymphocytes Relative: 31 % (ref 12–46)
Lymphs Abs: 2.7 10*3/uL (ref 0.7–4.0)
Monocytes Absolute: 0.7 10*3/uL (ref 0.1–1.0)
Monocytes Relative: 8 % (ref 3–12)
Neutro Abs: 5.1 10*3/uL (ref 1.7–7.7)
Neutrophils Relative %: 58 % (ref 43–77)

## 2011-03-28 LAB — POCT URINALYSIS DIP (DEVICE)
Glucose, UA: NEGATIVE mg/dL
Nitrite: NEGATIVE
Protein, ur: NEGATIVE mg/dL
Urobilinogen, UA: 0.2 mg/dL (ref 0.0–1.0)

## 2011-03-28 MED ORDER — CIPROFLOXACIN HCL 500 MG PO TABS: 500.0000 mg | ORAL_TABLET | Freq: Two times a day (BID) | ORAL | Status: AC

## 2011-03-28 MED ORDER — SODIUM CHLORIDE 0.9 % IV BOLUS (SEPSIS)
1000.0000 mL | Freq: Once | INTRAVENOUS | Status: AC
  Administered 2011-03-28: 1000 mL via INTRAVENOUS

## 2011-03-28 MED ORDER — METRONIDAZOLE IN NACL 5-0.79 MG/ML-% IV SOLN
500.0000 mg | Freq: Once | INTRAVENOUS | Status: AC
  Administered 2011-03-28: 500 mg via INTRAVENOUS
  Filled 2011-03-28: qty 100

## 2011-03-28 MED ORDER — METRONIDAZOLE 500 MG PO TABS: 500.0000 mg | ORAL_TABLET | Freq: Two times a day (BID) | ORAL | Status: AC

## 2011-03-28 MED ORDER — IOHEXOL 300 MG/ML  SOLN
80.0000 mL | Freq: Once | INTRAMUSCULAR | Status: AC | PRN
  Administered 2011-03-28: 80 mL via INTRAVENOUS

## 2011-03-28 MED ORDER — CIPROFLOXACIN IN D5W 400 MG/200ML IV SOLN
400.0000 mg | Freq: Once | INTRAVENOUS | Status: AC
  Administered 2011-03-28: 400 mg via INTRAVENOUS
  Filled 2011-03-28: qty 200

## 2011-03-28 MED ORDER — HYDROCODONE-ACETAMINOPHEN 5-325 MG PO TABS: 1.0000 | ORAL_TABLET | Freq: Four times a day (QID) | ORAL | Status: DC | PRN

## 2011-03-28 NOTE — ED Provider Notes (Signed)
Medical screening examination/treatment/procedure(s) were performed by non-physician practitioner and as supervising physician I was immediately available for consultation/collaboration.   Glynn Octave, MD 03/28/11 [phone]

## 2011-03-28 NOTE — ED Provider Notes (Signed)
History     CSN: [id number]  Arrival date & time 03/28/11  4098   First MD Initiated Contact with Patient 03/28/11 0935      Chief Complaint  Patient presents with  . Abdominal Pain    (Consider location/radiation/quality/duration/timing/severity/associated sxs/prior treatment) Patient is a 61 y.o. female presenting with abdominal pain. The history is provided by the patient and a relative. The history is limited by a language barrier. Language Interpreter Used: daughter translated,  Abdominal Pain The primary symptoms of the illness include abdominal pain. The primary symptoms of the illness do not include fever, nausea, vomiting, diarrhea, dysuria, vaginal discharge or vaginal bleeding. The current episode started 2 days ago. The onset of the illness was sudden. The problem has been gradually worsening.  The illness is associated with recent travel. The patient states that she believes she is currently not pregnant. Risk factors: s/p tubal lig, and hysterectomy., still with ovaries. Additional symptoms associated with the illness include chills. Symptoms associated with the illness do not include anorexia, constipation, urgency, hematuria, frequency or back pain.    Past Medical History  Diagnosis Date  . Hypertension   . High cholesterol   . S/P partial hysterectomy     Past Surgical History  Procedure Date  . Abdominal hysterectomy     No family history on file.  History  Substance Use Topics  . Smoking status: Never Smoker   . Smokeless tobacco: Not on file  . Alcohol Use: No    OB History    Grav Para Term Preterm Abortions TAB SAB Ect Mult Living                  Review of Systems  Constitutional: Positive for chills. Negative for fever.  HENT: Negative.   Gastrointestinal: Positive for abdominal pain. Negative for nausea, vomiting, diarrhea, constipation and anorexia.  Genitourinary: Negative for dysuria, urgency, frequency, hematuria, vaginal bleeding and  vaginal discharge.  Musculoskeletal: Negative for back pain.    Allergies  Review of patient's allergies indicates no known allergies.  Home Medications   Current Outpatient Rx  Name Route Sig Dispense Refill  . ASPIRIN 325 MG PO TABS Oral Take 325 mg by mouth daily.    Marland Kitchen EZETIMIBE 10 MG PO TABS Oral Take 10 mg by mouth daily.    . IRBESARTAN 150 MG PO TABS Oral Take 150 mg by mouth at bedtime.    Marland Kitchen NIACIN ER (ANTIHYPERLIPIDEMIC) 1000 MG PO TBCR Oral Take 1,000 mg by mouth at bedtime.    Marland Kitchen PANTOPRAZOLE SODIUM 40 MG PO TBEC Oral Take 40 mg by mouth daily.    Marland Kitchen PRAVASTATIN SODIUM 20 MG PO TABS Oral Take 20 mg by mouth daily.      BP 138/96  Pulse 74  Temp(Src) 97.7 F (36.5 C) (Oral)  Resp 16  SpO2 98%  Physical Exam  Nursing note and vitals reviewed. Constitutional: She appears well-developed and well-nourished.  HENT:  Head: Normocephalic.  Neck: Normal range of motion. Neck supple.  Cardiovascular: Normal rate, regular rhythm, normal heart sounds and intact distal pulses.   Pulmonary/Chest: Effort normal and breath sounds normal.  Abdominal: Soft. Normal appearance and bowel sounds are normal. She exhibits no distension and no mass. There is no hepatosplenomegaly. There is tenderness in the left lower quadrant. There is no rebound, no guarding and no CVA tenderness. No hernia.    Lymphadenopathy:    She has no cervical adenopathy.    ED Course  Procedures (including  critical care time)  Labs Reviewed  POCT URINALYSIS DIP (DEVICE) - Abnormal; Notable for the following:    Leukocytes, UA SMALL (*) Biochemical Testing Only. Please order routine urinalysis from main lab if confirmatory testing is needed.   All other components within normal limits   No results found.   1. Abdominal pain, left lower quadrant       MDM  U/a neg.        Barkley Bruns, MD 03/28/11 1024

## 2011-03-28 NOTE — ED Notes (Signed)
Pt d/c home in NAD. Pt ambulated with quick steady gait.  

## 2011-03-28 NOTE — ED Notes (Signed)
C/o aching pain to LLQ abdomen since Sunday pm.  Denies n/v, diarrhea, constipation, fever or dysuria.  States urine has been dark.  Pt states last BM was yesterday and it was normal.  Reports she has had bloody stools for approx 3 years and has been seeing her PCP for this- states they have told her she has small hemorrhoid.

## 2011-03-28 NOTE — ED Notes (Signed)
CT notified that pt finished contrast dye

## 2011-03-28 NOTE — ED Provider Notes (Signed)
History     CSN: [id number]  Arrival date & time 03/28/11  1038   First MD Initiated Contact with Patient 03/28/11 1045      Chief Complaint  Patient presents with  . Abdominal Pain    (Consider location/radiation/quality/duration/timing/severity/associated sxs/prior treatment) HPI  Past Medical History  Diagnosis Date  . Hypertension   . High cholesterol   . S/P partial hysterectomy     Past Surgical History  Procedure Date  . Abdominal hysterectomy     No family history on file.  History  Substance Use Topics  . Smoking status: Never Smoker   . Smokeless tobacco: Not on file  . Alcohol Use: Yes    OB History    Grav Para Term Preterm Abortions TAB SAB Ect Mult Living                  Review of Systems  Allergies  Review of patient's allergies indicates no known allergies.  Home Medications   Current Outpatient Rx  Name Route Sig Dispense Refill  . ASPIRIN EC 325 MG PO TBEC Oral Take 325 mg by mouth daily.    Marland Kitchen EZETIMIBE 10 MG PO TABS Oral Take 10 mg by mouth daily.    . IRBESARTAN 150 MG PO TABS Oral Take 150 mg by mouth at bedtime.    Marland Kitchen NIACIN ER (ANTIHYPERLIPIDEMIC) 1000 MG PO TBCR Oral Take 1,000 mg by mouth at bedtime.    Marland Kitchen PANTOPRAZOLE SODIUM 40 MG PO TBEC Oral Take 40 mg by mouth daily.    Marland Kitchen PRAVASTATIN SODIUM 20 MG PO TABS Oral Take 20 mg by mouth daily.    Marland Kitchen CIPROFLOXACIN HCL 500 MG PO TABS Oral Take 1 tablet (500 mg total) by mouth 2 (two) times daily. 20 tablet 0  . HYDROCODONE-ACETAMINOPHEN 5-325 MG PO TABS Oral Take 1 tablet by mouth every 6 (six) hours as needed for pain. 15 tablet 0  . METRONIDAZOLE 500 MG PO TABS Oral Take 1 tablet (500 mg total) by mouth 2 (two) times daily. 20 tablet 0    BP 115/73  Pulse 82  Temp(Src) 98 F (36.7 C) (Oral)  Resp 18  SpO2 99%  Physical Exam  ED Course  Procedures (including critical care time)  Labs Reviewed  COMPREHENSIVE METABOLIC PANEL - Abnormal; Notable for the following:    GFR  calc non Af Amer 76 (*)    GFR calc Af Amer 88 (*)    All other components within normal limits  URINALYSIS, ROUTINE W REFLEX MICROSCOPIC - Abnormal; Notable for the following:    Leukocytes, UA TRACE (*)    All other components within normal limits  URINE MICROSCOPIC-ADD ON - Abnormal; Notable for the following:    Squamous Epithelial / LPF FEW (*)    All other components within normal limits  CBC  DIFFERENTIAL  LAB REPORT - SCANNED   No results found.   1. Diverticulitis       MDM          Barkley Bruns, MD 03/31/11 [phone]

## 2011-03-28 NOTE — ED Notes (Signed)
Provider at bedside

## 2011-03-28 NOTE — ED Notes (Signed)
Patient was transferred from urgent care for further eval.Patient reported to have onset of left lower abd pain on Sunday.  Patient denies n/v/d.  Denies diff voiding.  Reports her pain has worsened.

## 2011-03-28 NOTE — ED Notes (Signed)
Given pt sandwich and drink per provider Ebbie Ridge

## 2011-03-28 NOTE — ED Provider Notes (Signed)
History     CSN: [id number]  Arrival date & time 03/28/11  1038   First MD Initiated Contact with Patient 03/28/11 1045      Chief Complaint  Patient presents with  . Abdominal Pain    (Consider location/radiation/quality/duration/timing/severity/associated sxs/prior treatment) HPI Patient is a 61 yo hispanic female who presents with a 2 day history of left sided abdominal pain. The patient cannot speak english and the daughter in law is present and interprets. The pain is described as a heavy pressure that started suddenly on Sunday evening after returning from a trip to [place] and has been constant ever since. She has never had a problem like this before. She has taken ibuprofen 400 mg for pain with little relief. She states that the pain is worse when she moves around and is on her whole left side and left inguinal area.  Her last bowel movement was yesterday and it was normal. She reports a longstanding history of blood in her stool from hemorroids. Her last colonoscopy was approx 2 years ago with no significant findings. No family history of colon cancer or diverticulitis. She reports chills since Sunday but denies fevers and sweats. She denies chest pain, abdominal pain, dizziness, lightheadedness, N/V/D. She currently takes medication for HTN, hyperlipidemia. She no known drug allergies.  Past Medical History  Diagnosis Date  . Hypertension   . High cholesterol   . S/P partial hysterectomy     Past Surgical History  Procedure Date  . Abdominal hysterectomy     No family history on file.  History  Substance Use Topics  . Smoking status: Never Smoker   . Smokeless tobacco: Not on file  . Alcohol Use: Yes    OB History    Grav Para Term Preterm Abortions TAB SAB Ect Mult Living                  Review of Systems All pertinent positives and negatives reviewed in the history of present illness  Allergies  Review of patient's allergies indicates no known  allergies.  Home Medications   Current Outpatient Rx  Name Route Sig Dispense Refill  . ASPIRIN EC 325 MG PO TBEC Oral Take 325 mg by mouth daily.    Marland Kitchen EZETIMIBE 10 MG PO TABS Oral Take 10 mg by mouth daily.    . IRBESARTAN 150 MG PO TABS Oral Take 150 mg by mouth at bedtime.    Marland Kitchen NIACIN ER (ANTIHYPERLIPIDEMIC) 1000 MG PO TBCR Oral Take 1,000 mg by mouth at bedtime.    Marland Kitchen PANTOPRAZOLE SODIUM 40 MG PO TBEC Oral Take 40 mg by mouth daily.    Marland Kitchen PRAVASTATIN SODIUM 20 MG PO TABS Oral Take 20 mg by mouth daily.      BP 117/83  Pulse 84  Temp(Src) 98 F (36.7 C) (Oral)  Resp 16  SpO2 100%  Physical Exam  Constitutional: She is oriented to person, place, and time. She appears well-developed and well-nourished. No distress.  Cardiovascular: Normal rate, regular rhythm and normal heart sounds.   Pulmonary/Chest: Effort normal and breath sounds normal. No respiratory distress.  Abdominal: Soft. Bowel sounds are normal. She exhibits no distension and no mass. There is tenderness (LUQ, LLQ). There is no rebound and no guarding.  Neurological: She is alert and oriented to person, place, and time.  Skin: Skin is warm and dry. She is not diaphoretic.    ED Course  Procedures (including critical care time)    I spoke  with Dr. Delle Reining about the patient and he would like to follow up with her in his office.    The patient has been stable here in the ER and has been able to eat and drink.   MDM  MDM Reviewed: previous chart, vitals and nursing note Reviewed previous: labs Interpretation: labs and CT scan Consults: gastrointestinal           Carlyle Dolly, PA-C 03/28/11 1611

## 2011-04-17 ENCOUNTER — Encounter: Payer: Self-pay | Admitting: *Deleted

## 2011-04-18 ENCOUNTER — Ambulatory Visit: Payer: Self-pay | Admitting: Internal Medicine

## 2011-06-02 ENCOUNTER — Telehealth: Payer: Self-pay | Admitting: Internal Medicine

## 2011-06-02 NOTE — Telephone Encounter (Signed)
Patient saw [facility] today for abdominal pain and was given two antibiotics. Patient's daughter in law wants her to be seen before 06/29/11. Moved OV to 06/06/11 at 1:30 PM. She will have patient take antibiotics and understands to go to ED if has fever or gets worse.

## 2011-06-02 NOTE — Telephone Encounter (Signed)
Ok - she had diverticulitis in Feb and probably has a recurrence

## 2011-06-06 ENCOUNTER — Ambulatory Visit (INDEPENDENT_AMBULATORY_CARE_PROVIDER_SITE_OTHER)
Admission: RE | Admit: 2011-06-06 | Discharge: 2011-06-06 | Disposition: A | Discharge status: Home / Self Care | Payer: Medicaid Other | Source: Ambulatory Visit | Attending: Internal Medicine | Admitting: Internal Medicine

## 2011-06-06 ENCOUNTER — Other Ambulatory Visit (INDEPENDENT_AMBULATORY_CARE_PROVIDER_SITE_OTHER): Payer: Medicaid Other

## 2011-06-06 ENCOUNTER — Ambulatory Visit (INDEPENDENT_AMBULATORY_CARE_PROVIDER_SITE_OTHER): Payer: Medicaid Other | Admitting: Internal Medicine

## 2011-06-06 ENCOUNTER — Other Ambulatory Visit: Payer: Self-pay | Admitting: Internal Medicine

## 2011-06-06 ENCOUNTER — Encounter: Payer: Self-pay | Admitting: Internal Medicine

## 2011-06-06 ENCOUNTER — Telehealth: Payer: Self-pay | Admitting: *Deleted

## 2011-06-06 VITALS — BP 120/80 | HR 110 | Ht 62.0 in | Wt 189.9 lb

## 2011-06-06 DIAGNOSIS — R112 Nausea with vomiting, unspecified: Secondary | ICD-10-CM

## 2011-06-06 DIAGNOSIS — K5732 Diverticulitis of large intestine without perforation or abscess without bleeding: Secondary | ICD-10-CM

## 2011-06-06 DIAGNOSIS — K5792 Diverticulitis of intestine, part unspecified, without perforation or abscess without bleeding: Secondary | ICD-10-CM

## 2011-06-06 LAB — CBC WITH DIFFERENTIAL/PLATELET
Basophils Relative: 0.7 % (ref 0.0–3.0)
Eosinophils Absolute: 0 10*3/uL (ref 0.0–0.7)
Hemoglobin: 14.1 g/dL (ref 12.0–15.0)
Lymphocytes Relative: 20.6 % (ref 12.0–46.0)
MCHC: 33.3 g/dL (ref 30.0–36.0)
Monocytes Relative: 8.5 % (ref 3.0–12.0)
Neutrophils Relative %: 70 % (ref 43.0–77.0)
RBC: 4.79 Mil/uL (ref 3.87–5.11)
WBC: 12 10*3/uL — ABNORMAL HIGH (ref 4.5–10.5)

## 2011-06-06 LAB — COMPREHENSIVE METABOLIC PANEL
ALT: 53 U/L — ABNORMAL HIGH (ref 0–35)
AST: 44 U/L — ABNORMAL HIGH (ref 0–37)
Albumin: 4.1 g/dL (ref 3.5–5.2)
BUN: 13 mg/dL (ref 6–23)
CO2: 24 mEq/L (ref 19–32)
Calcium: 9.4 mg/dL (ref 8.4–10.5)
Chloride: 101 mEq/L (ref 96–112)
GFR: 60.77 mL/min (ref >60.00)
Potassium: 4.2 mEq/L (ref 3.5–5.1)

## 2011-06-06 MED ORDER — AMOXICILLIN-POT CLAVULANATE 875-125 MG PO TABS: 1.0000 | ORAL_TABLET | Freq: Two times a day (BID) | ORAL | Status: DC

## 2011-06-06 MED ORDER — HYDROCODONE-ACETAMINOPHEN 5-325 MG PO TABS: 1.0000 | ORAL_TABLET | Freq: Four times a day (QID) | ORAL | Status: AC | PRN

## 2011-06-06 MED ORDER — IOHEXOL 300 MG/ML  SOLN
100.0000 mL | Freq: Once | INTRAMUSCULAR | Status: AC | PRN
  Administered 2011-06-06: 100 mL via INTRAVENOUS

## 2011-06-06 MED ORDER — PROMETHAZINE HCL 25 MG PO TABS: 25.0000 mg | ORAL_TABLET | Freq: Four times a day (QID) | ORAL | Status: DC | PRN

## 2011-06-06 NOTE — Progress Notes (Signed)
Quick Note:  I called results to her daughter.  Will rx promethazine for nausea and vomiting and plan is to change treatment to Augmentin 875 mg bid x 10 days. Rx sent.  She needs follow-up with me later in May ______

## 2011-06-06 NOTE — Patient Instructions (Addendum)
Your physician has requested that you go to the basement for lab work before leaving today.   You have been scheduled for a CT scan of the abdomen and pelvis at [facility] ([address]---this is in the same building as [facility]).   You are scheduled on 06/06/2011 at 2:30pm. You should arrive 15 minutes prior to your appointment time for registration. Please follow the written instructions below on the day of your exam:  WARNING: IF YOU ARE ALLERGIC TO IODINE/X-RAY DYE, PLEASE NOTIFY RADIOLOGY IMMEDIATELY AT [phone]! YOU WILL BE GIVEN A 13 HOUR PREMEDICATION PREP.  1) Do not eat or drink anything after  (4 hours prior to your test) 2) You have been given 2 bottles of oral contrast to drink. The solution may taste  better if refrigerated, but do NOT add ice or any other liquid to this solution. Shake  well before drinking.    Drink 1 bottle of contrast @ 12:30pm (2 hours prior to your exam)  Drink 1 bottle of contrast @ 1:30pm (1 hour prior to your exam)  You may take any medications as prescribed with a small amount of water except for the following: Metformin, Glucophage, Glucovance, Avandamet, Riomet, Fortamet, Actoplus Met, Janumet, Glumetza or Metaglip. The above medications must be held the day of the exam AND 48 hours after the exam.  The purpose of you drinking the oral contrast is to aid in the visualization of your intestinal tract. The contrast solution may cause some diarrhea. Before your exam is started, you will be given a small amount of fluid to drink. Depending on your individual set of symptoms, you may also receive an intravenous injection of x-ray contrast/dye. Plan on being at [facility] for 30 minutes or long, depending on the type of exam you are having performed.  If you have any questions regarding your exam or if you need to reschedule, you may call the CT department at [phone] between the hours of 8:00 am and 5:00 pm,  Monday-Friday.  ________________________________________________________________________

## 2011-06-06 NOTE — Progress Notes (Signed)
  Subjective:    Patient ID: Julie Dougherty, female    DOB: 07/03/1950, 61 y.o.   MRN: [id number]  HPI This middle-aged Hispanic woman is here with her daughter-in-law, because of her current left lower quadrant pain. I know her from previous colonoscopy. She had diverticulitis diagnosed by CT scan in February 2013. An ER visit then, she was treated with Cipro and metronidazole and had resolution. There was diverticulitis at the sigmoid descending colon junction. Last week she developed recurrent left lower quadrant pain similar. She was started on Cipro and metronidazole in May 3. She's had the onset of nausea and vomiting since starting the antibiotics. The pain may be a bit more intense in February. There were some urinary frequency associated with this. She has had subjective fevers and chills. She may be a little bit better with respect to pain but overall is not improving that much. He is having difficulty keeping all food down no has kept her medicine down I believe. Bowel movements, are small, she has chronic constipation and chronic recurrent rectal bleeding. 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 He does strain to stool quite a bit.  Medications, allergies, past medical history, past surgical history, family history and social history are reviewed and updated in the EMR.   Review of Systems As per history of present illness.    Objective:   Physical Exam General:  NAD Eyes:   anicteric Lungs:  clear Heart:  S1S2 no rubs, murmurs or gallops Abdomen:  soft and moderately tender in the left lower quadrant and suprapubic area with slight guarding no rebound, BS+ Ext:   no edema    Data Reviewed:  February CT scan of abdomen and pelvis [facility] primary care note       Assessment & Plan:   1. Diverticulitis   I think she has a recurrent problem with this. Because of the signs and symptoms, I ordered a CT  scan of the abdomen and pelvis to look for possible abscess. She may need a change in her antibiotics. Followup will be arranged pending the results of the scan and clinical course.   2. Nausea and vomiting in adult   Question if related to the metronidazole or related to the abdominal pain and presumed diverticulitis. Hold metronidazole. Await CT scanning. Full liquid diet.    She also has chronic constipation, I think she'll need something like MiraLax on a regular basis, the contact was after she completes therapy for diverticulitis. There is also rectal bleeding and known hemorrhoids and solitary rectal ulcer syndrome. Treatment of the constipation and straining should help that.   CC: Yisroel Ramming, MD

## 2011-06-06 NOTE — Telephone Encounter (Signed)
Spoke with Rose and gave her Dr. Marvell Fuller recommendations.

## 2011-06-06 NOTE — Telephone Encounter (Signed)
Spoke with patient's family and patient is vomiting up the contrast. Spoke with Dr. Leone Payor and he wants to go ahead with the CT. He would like to have rectal contrast given if possible. He is checking her sigmoid diverticulitis. Call in to [place] [facility] and patient's family aware.

## 2011-06-29 ENCOUNTER — Ambulatory Visit: Payer: Self-pay | Admitting: Internal Medicine

## 2011-06-30 ENCOUNTER — Encounter: Payer: Self-pay | Admitting: Internal Medicine

## 2011-06-30 ENCOUNTER — Ambulatory Visit (INDEPENDENT_AMBULATORY_CARE_PROVIDER_SITE_OTHER): Payer: Medicaid Other | Admitting: Internal Medicine

## 2011-06-30 VITALS — BP 118/60 | HR 92 | Ht 62.0 in | Wt 185.0 lb

## 2011-06-30 DIAGNOSIS — K5900 Constipation, unspecified: Secondary | ICD-10-CM | POA: Diagnosis not present

## 2011-06-30 DIAGNOSIS — K648 Other hemorrhoids: Secondary | ICD-10-CM | POA: Diagnosis not present

## 2011-06-30 DIAGNOSIS — K5732 Diverticulitis of large intestine without perforation or abscess without bleeding: Secondary | ICD-10-CM

## 2011-06-30 DIAGNOSIS — K5792 Diverticulitis of intestine, part unspecified, without perforation or abscess without bleeding: Secondary | ICD-10-CM

## 2011-06-30 HISTORY — DX: Other hemorrhoids: K64.8

## 2011-06-30 MED ORDER — POLYETHYLENE GLYCOL 3350 17 GM/SCOOP PO POWD: 17.0000 g | Freq: Every day | ORAL | Status: AC

## 2011-06-30 MED ORDER — HYDROCORTISONE ACETATE 25 MG RE SUPP: 25.0000 mg | Freq: Every day | RECTAL | Status: AC

## 2011-06-30 NOTE — Progress Notes (Signed)
  Subjective:    Patient ID: Julie Dougherty, female    DOB: [birth year], 61 y.o.   MRN: [id number]  HPI The patient is a middle-aged woman from the [place] and returns with an interpreter. She was recently treated for a recurrent episode of diverticulitis, confirmed by CT scan, in the sigmoid colon. She's through with her antibiotic treatment. She is feeling better. However she still has symptoms.  She remains constipated, only moving her bowels every few days. When she has a large bowel movement and strains to stool she will see red blood. This is into the commode and on the toilet paper. She has known hemorrhoids from colonoscopy in 2011. There is also rectal pain when she defecates.  She still has some left lower quadrant pain but it is much better, when she bends over it occurs, it is clearly better when she has more regular bowel movements. Toco Magnesia has helped her bowel movements as has MiraLax though she is not using these regularly at this time. She is still on a restricted diet and is really only been eating soups according to her daughter-in-law.  Medications, allergies, past medical history, past surgical history, family history and social history are reviewed and updated in the EMR.   Review of Systems As above    Objective:   Physical Exam Well-developed elderly Hispanic woman in no acute distress, overweight.  Rectal exam with female staff present she is a small anal tag. There is normal resting sphincter tone. There is no rectal mass or rectocele. There is a good voluntary squeeze and there is a normal simulated defecation with appropriate consent and abdominal wall contraction.  Anoscopy is performed with female staff present. There are swollen inflamed internal hemorrhoids in the anal canal.  Abdominal exam shows that she has a soft and nontender, exam without mass.     Assessment & Plan:   1. Diverticulitis large intestine   I believe this is resolved. She  has had at least 2 episodes now. She may liberalize her diet with high fiber. Observe for recurrence. Her daughter-in-law informs that the patient hopes to go to the [place] later this summer, I think it would probably send her with a prescription of antibiotics to have just in case she has a recurrent attack. She could self treat.   2. Constipation  I think this is probably related to her significant diverticulosis. Start MiraLax 1 dose every day. High fiber diet.   3. Hemorrhoids, internal, with bleeding   Hydrocortisone suppository nightly, for 7 days and then as needed. Controlling bowel movement pattern with MiraLax should make a difference as well.    I have provided Spanish handouts on high fiber diet, diverticulosis and hemorrhoids.  CC: Yisroel Ramming, MD

## 2011-06-30 NOTE — Patient Instructions (Addendum)
We have sent the following medications to your pharmacy for you to pick up at your convenience: Rectal suppositories, use these for 1 week every night and then as needed.  Today we are giving you samples of Miralax, use this every day. This is over the counter. Use the coupons given today.  We are giving you information on Hemorrhoids and also on high fiber diet choices.  Follow-up with [place] in July 2013.

## 2011-07-17 ENCOUNTER — Telehealth: Payer: Self-pay | Admitting: Internal Medicine

## 2011-07-17 MED ORDER — AMOXICILLIN-POT CLAVULANATE 875-125 MG PO TABS: 1.0000 | ORAL_TABLET | Freq: Two times a day (BID) | ORAL | Status: AC

## 2011-07-17 NOTE — Telephone Encounter (Signed)
Dr Leone Payor she has follow up with you on 08/07/11.  Does she need to be seen prior to going to the [place] to take care of her sick father.  You stated in your note you would send a rx for diverticulitis for her to have on hand to travel.  Is it ok to cancel or do you want me to work her in the office one day next week.  She is very [place]

## 2011-07-17 NOTE — Telephone Encounter (Signed)
If she feels ok its ok for her to travel. I sent an Augmetin Rx into pharmacy for her to pick up.  If there are ?'s about if she is ok Amy could check her this week.

## 2011-07-18 ENCOUNTER — Ambulatory Visit (INDEPENDENT_AMBULATORY_CARE_PROVIDER_SITE_OTHER): Payer: Medicaid Other | Admitting: Physician Assistant

## 2011-07-18 ENCOUNTER — Encounter: Payer: Self-pay | Admitting: Physician Assistant

## 2011-07-18 VITALS — BP 120/70 | HR 66 | Ht 60.0 in | Wt 184.2 lb

## 2011-07-18 DIAGNOSIS — K5732 Diverticulitis of large intestine without perforation or abscess without bleeding: Secondary | ICD-10-CM | POA: Diagnosis not present

## 2011-07-18 DIAGNOSIS — K5792 Diverticulitis of intestine, part unspecified, without perforation or abscess without bleeding: Secondary | ICD-10-CM

## 2011-07-18 MED ORDER — ALIGN PO CAPS: 1.0000 | ORAL_CAPSULE | Freq: Every day | ORAL | Status: DC

## 2011-07-18 NOTE — Progress Notes (Signed)
Subjective:    Patient ID: Julie Dougherty, female    DOB: 14-Jul-1950, 61 y.o.   MRN: [id number]  HPI Julie Dougherty is a 61 year old Hispanic female known to Dr. Leone Payor who has history of diverticular disease and diverticulitis. She was recently seen by Dr. Leone Payor on 06/30/2011 at that time she was getting over an episode of diverticulitis. Apparently she had initially been treated with Cipro and Flagyl which made her very nauseated and she was switched to Augmentin 875 twice daily and tolerated that much better. She says at this time she is feeling significantly better but still has some mild discomfort in the left lower quadrant which has been intermittent. He has no complaint of fever or chills her appetite has been fine and she's been eating well. She is having normal bowel movements and no discomfort with bowel movements. He has not had any bleeding. She is concerned because she is anticipating a trip to the [place] within the next week. Her father is ill and she may be gone for 1-2 months.    Review of Systems  Constitutional: Negative.   Eyes: Negative.   Respiratory: Negative.   Cardiovascular: Negative.   Gastrointestinal: Positive for abdominal pain.  Genitourinary: Negative.   Musculoskeletal: Negative.   Neurological: Negative.   Hematological: Negative.   Psychiatric/Behavioral: Negative.    Outpatient Prescriptions Prior to Visit  Medication Sig Dispense Refill  . aspirin EC 325 MG tablet Take 325 mg by mouth daily.      Marland Kitchen ezetimibe (ZETIA) 10 MG tablet Take 10 mg by mouth daily.      . irbesartan (AVAPRO) 150 MG tablet Take 150 mg by mouth at bedtime.      . niacin (NIASPAN) 1000 MG CR tablet Take 1,000 mg by mouth at bedtime.      . pantoprazole (PROTONIX) 40 MG tablet Take 40 mg by mouth daily.      . pravastatin (PRAVACHOL) 20 MG tablet Take 20 mg by mouth daily.      . promethazine (PHENERGAN) 25 MG tablet Take 25 mg by mouth every 6 (six) hours as needed.      Marland Kitchen  amoxicillin-clavulanate (AUGMENTIN) 875-125 MG per tablet Take 1 tablet by mouth 2 (two) times daily. Start if symptoms of diverticulitis return and notify Dr. Leone Payor if so.Take with food.  20 tablet  0   Allergies  Allergen Reactions  . Ciprofloxacin     nausea  . Flagyl (Metronidazole)     nausea  . Statins    Patient Active Problem List  Diagnosis  . HYPERLIPIDEMIA  . HYPERTENSION, ESSENTIAL NOS  . GERD  . ABDOMINAL PAIN, RIGHT LOWER QUADRANT  . HEPATITIS B, HX OF  . Diverticulitis  . Hemorrhoids, internal, with bleeding   History   Social History  . Marital Status: Single    Spouse Name: N/A    Number of Children: 3  . Years of Education: N/A   Occupational History  . Not on file.   Social History Main Topics  . Smoking status: Never Smoker   . Smokeless tobacco: Never Used  . Alcohol Use: No  . Drug Use: No  . Sexually Active: Not on file   Other Topics Concern  . Not on file   Social History Narrative  . No narrative on file      Objective:   Physical Exam well-developed Hispanic female in no acute distress, accompanied by a family member and an interpreter. Blood pressure 120/70 pulse 66 height 5  foot weight 184. HEENT; nontraumatic normocephalic EOMI PERRLA sclera anicteric, Supple no JVD, Cardiovascular; regular rate and rhythm with S1-S2 no murmur gallop, Pulmonary; clear bilaterally, Abdomen soft she is very mildly tender in the left lower quadrant there is no guarding no rebound no palpable mass or hepatosplenomegaly bowel sounds are active, Rectal; exam not done, Extremities; no clubbing cyanosis or edema skin warm dry, Psych; mood and affect normal and appropriate        Assessment & Plan:  #60 61 year old female with recent episode of diverticulitis improved but not completely resolved with mild residual left lower quadrant discomfort. Plan; refill Augmentin 875 mg by mouth twice daily and patient advised to take for another 7 days. We  filled her  prescription for 60 tablets so that she will have medication available should she have another episode of diverticulitis while she is out of the country. She is advised that should she have a recurrent episode to start back on the Augmentin 875 mg  twice daily for 10 days. Start Align ,one  by mouth daily x30 days, samples were given. She will followup with Dr. Leone Payor as needed.

## 2011-07-18 NOTE — Telephone Encounter (Signed)
Patient is still having some pain.  She will come in and see Mike Gip PA today.

## 2011-07-18 NOTE — Patient Instructions (Addendum)
We sent prescription for Augmentin to [facility]. Take as directed.  If the pain comes back resetart the Augmention twice daily for 10 days. We gave samples of Align, 1 capsule daily for 28 days.

## 2011-07-19 NOTE — Progress Notes (Signed)
i agree with the plan outlined in this note 

## 2011-08-07 ENCOUNTER — Ambulatory Visit: Payer: Self-pay | Admitting: Internal Medicine

## 2011-10-25 ENCOUNTER — Emergency Department (INDEPENDENT_AMBULATORY_CARE_PROVIDER_SITE_OTHER)
Admission: EM | Admit: 2011-10-25 | Discharge: 2011-10-25 | Disposition: A | Discharge status: Home / Self Care | Payer: Medicaid Other | Source: Home / Self Care

## 2011-10-25 ENCOUNTER — Encounter (HOSPITAL_COMMUNITY): Payer: Self-pay | Admitting: Emergency Medicine

## 2011-10-25 DIAGNOSIS — L0291 Cutaneous abscess, unspecified: Secondary | ICD-10-CM

## 2011-10-25 DIAGNOSIS — L0390 Cellulitis, unspecified: Secondary | ICD-10-CM

## 2011-10-25 MED ORDER — HYDROCODONE-ACETAMINOPHEN 5-325 MG PO TABS: 1.0000 | ORAL_TABLET | ORAL | Status: DC | PRN

## 2011-10-25 MED ORDER — SULFAMETHOXAZOLE-TRIMETHOPRIM 800-160 MG PO TABS: 1.0000 | ORAL_TABLET | Freq: Two times a day (BID) | ORAL | Status: DC

## 2011-10-25 NOTE — ED Notes (Signed)
Abscess left axilla.  History of abscess >20 years ago.  Patient noticed this abscess 15 days ago.  Used vicks on site.

## 2011-10-25 NOTE — ED Provider Notes (Addendum)
History     CSN: [id number]  Arrival date & time 10/25/11  4098   None     Chief Complaint  Patient presents with  . Abscess    (Consider location/radiation/quality/duration/timing/severity/associated sxs/prior treatment) HPI Comments: 61-year-old Spanish female presents with a painful growing lesion in the left axilla started 2 weeks ago. Initially started as a pimple that drained a small amount of pus then closed. Following that the current structure beginning to grow larger.is approximately 7 cm x 4 cm in the left axilla. Is a 1 cm annular red lesion raised above the induration. The induration is the structure that it extends 7 cm.it is very tender. Denies associated systemic symptoms , no fever   Past Medical History  Diagnosis Date  . Hypertension   . High cholesterol   . Internal hemorrhoids   . Diverticulosis   . Hiatal hernia   . PUD (peptic ulcer disease)   . GERD (gastroesophageal reflux disease)   . Chronic constipation   . Diverticulitis     Past Surgical History  Procedure Date  . Abdominal hysterectomy   . Colonoscopy 04/27/09     POLYPOID FRAGMENT OF COLONIC MUCOSA WITH SURFACE    Family History  Problem Relation Age of Onset  . Colon cancer Neg Hx     History  Substance Use Topics  . Smoking status: Never Smoker   . Smokeless tobacco: Never Used  . Alcohol Use: No    OB History    Grav Para Term Preterm Abortions TAB SAB Ect Mult Living                  Review of Systems  Constitutional: Negative for fever, chills and activity change.  HENT: Negative.   Respiratory: Negative.   Cardiovascular: Negative.   Musculoskeletal: Negative.   Skin: Negative for pallor and rash.       As per history of present illness  Neurological: Negative.     Allergies  Ciprofloxacin; Flagyl; and Statins  Home Medications   Current Outpatient Rx  Name Route Sig Dispense Refill  . ASPIRIN EC 325 MG PO TBEC Oral Take 325 mg by mouth daily.    Marland Kitchen ALIGN PO  CAPS Oral Take 1 capsule by mouth daily. 28 capsule 0    Lot # [id number] Exp date: 10/2011  . EZETIMIBE 10 MG PO TABS Oral Take 10 mg by mouth daily.    Marland Kitchen HYDROCODONE-ACETAMINOPHEN 5-325 MG PO TABS Oral Take 1 tablet by mouth every 4 (four) hours as needed for pain. 12 tablet 0  . IRBESARTAN 150 MG PO TABS Oral Take 150 mg by mouth at bedtime.    Marland Kitchen NIACIN ER (ANTIHYPERLIPIDEMIC) 1000 MG PO TBCR Oral Take 1,000 mg by mouth at bedtime.    Marland Kitchen NIASPAN PO Oral Take 325 mg by mouth daily.    Marland Kitchen PANTOPRAZOLE SODIUM 40 MG PO TBEC Oral Take 40 mg by mouth daily.    Marland Kitchen PRAVASTATIN SODIUM 20 MG PO TABS Oral Take 20 mg by mouth daily.    Marland Kitchen PROMETHAZINE HCL 25 MG PO TABS Oral Take 25 mg by mouth every 6 (six) hours as needed.    . SULFAMETHOXAZOLE-TRIMETHOPRIM 800-160 MG PO TABS Oral Take 1 tablet by mouth 2 (two) times daily. X 7 days 14 tablet 0    BP 161/81  Pulse 63  Temp 97.6 F (36.4 C) (Oral)  Resp 20  SpO2 98%  Physical Exam  Constitutional: She is oriented to person, place, and  time. She appears well-developed and well-nourished. No distress.  HENT:  Head: Normocephalic and atraumatic.  Eyes: EOM are normal. Pupils are equal, round, and reactive to light.  Neck: Normal range of motion. Neck supple.  Musculoskeletal: Normal range of motion. She exhibits no edema.  Lymphadenopathy:    She has no cervical adenopathy.  Neurological: She is alert and oriented to person, place, and time. No cranial nerve deficit.  Skin: Skin is warm and dry.       As described in history of present illness there is a a structure that appears to be an abscess located in the left axilla. There is underlying induration and a small area of erythema above the induration. No current drainage, very tender    ED Course  INCISION AND DRAINAGE Date/Time: 10/25/2011 9:57 AM Performed by: Phineas Real, Caera Enwright Authorized by: Phineas Real, Jabron Weese Consent: Verbal consent obtained. Risks and benefits: risks, benefits and alternatives were  discussed Consent given by: patient Patient understanding: patient states understanding of the procedure being performed Type: abscess Body area: upper extremity Location details: left arm Anesthesia: local infiltration Local anesthetic: lidocaine 2% with epinephrine Patient sedated: no Incision type: single straight Complexity: simple Drainage: purulent Drainage amount: scant Wound treatment: wound left open Patient tolerance: Patient tolerated the procedure well with no immediate complications. Comments: Recheck with her doctor in 7-10 days, may return. Marland Kitchen Keep clean and dry for 24 hrs then can shower but keep water out of wound.    (including critical care time)   Labs Reviewed  CULTURE, ROUTINE-ABSCESS   No results found.   1. Cellulitis and abscess       MDM  Septra DS twice a day for 7 days Norco 5 mg one every 4 hours when necessary pain. On Culture obtained. Although the underlying induration is proximally 7 cm across there was only one drop of pus exuding from the wound. The underlying structures were not consistent with the usual type of aggressive MRSA abscess. It was more like a solid structure. If this lesion does not resolve within 10 days or so it is recommended that she followup with a surgeon or her doctor for further evaluation. This was explained to the significant other and he made the interpretation to the patient.        Hayden Rasmussen, NP 10/25/11 1000  Hayden Rasmussen, NP 10/27/11 2137

## 2011-10-26 NOTE — ED Provider Notes (Signed)
Medical screening examination/treatment/procedure(s) were performed by non-physician practitioner and as supervising physician I was immediately available for consultation/collaboration.   [facility]; MD   Sharin Grave, MD 10/26/11 1228

## 2011-10-28 LAB — CULTURE, ROUTINE-ABSCESS

## 2011-10-28 NOTE — ED Provider Notes (Signed)
Medical screening examination/treatment/procedure(s) were performed by resident physician or non-physician practitioner and as supervising physician I was immediately available for consultation/collaboration.   KINDL,JAMES DOUGLAS MD.    James D Kindl, MD 10/28/11 0902 

## 2011-11-08 ENCOUNTER — Telehealth (HOSPITAL_COMMUNITY): Payer: Self-pay | Admitting: *Deleted

## 2011-11-08 NOTE — ED Notes (Signed)
Abscess culture L axilla:  Few MRSA. Pt. adequately treated with Septra DS. I called pt.  Pt. does not speak [place]. I got pt.'s permission to give results to her daughter-in-law. She verified pt. X 2 and was given result.  I told her that she was  adequately treated with Septra and gave her the MRSA instructions. She voiced understanding and said she would tell her mother in law. Vassie Moselle 11/08/2011

## 2011-11-13 ENCOUNTER — Ambulatory Visit (INDEPENDENT_AMBULATORY_CARE_PROVIDER_SITE_OTHER): Payer: Self-pay | Admitting: Physician Assistant

## 2011-11-13 ENCOUNTER — Encounter: Payer: Self-pay | Admitting: Physician Assistant

## 2011-11-13 ENCOUNTER — Other Ambulatory Visit (INDEPENDENT_AMBULATORY_CARE_PROVIDER_SITE_OTHER): Payer: Medicaid Other

## 2011-11-13 VITALS — BP 118/74 | HR 104 | Ht 60.0 in | Wt 180.0 lb

## 2011-11-13 DIAGNOSIS — K5792 Diverticulitis of intestine, part unspecified, without perforation or abscess without bleeding: Secondary | ICD-10-CM

## 2011-11-13 DIAGNOSIS — K5732 Diverticulitis of large intestine without perforation or abscess without bleeding: Secondary | ICD-10-CM

## 2011-11-13 DIAGNOSIS — R1032 Left lower quadrant pain: Secondary | ICD-10-CM

## 2011-11-13 LAB — CBC WITH DIFFERENTIAL/PLATELET
Basophils Absolute: 0 10*3/uL (ref 0.0–0.1)
HCT: 40.7 % (ref 36.0–46.0)
Lymphs Abs: 2.2 10*3/uL (ref 0.7–4.0)
MCV: 90 fl (ref 78.0–100.0)
Monocytes Absolute: 0.8 10*3/uL (ref 0.1–1.0)
Monocytes Relative: 6.9 % (ref 3.0–12.0)
Neutrophils Relative %: 73.7 % (ref 43.0–77.0)
Platelets: 252 10*3/uL (ref 150.0–400.0)
RDW: 13.5 % (ref 11.5–14.6)

## 2011-11-13 LAB — BASIC METABOLIC PANEL
Calcium: 9.3 mg/dL (ref 8.4–10.5)
GFR: 68.62 mL/min (ref >60.00)
Sodium: 139 mEq/L (ref 135–145)

## 2011-11-13 NOTE — Patient Instructions (Addendum)
Please stay on Augmentin twice daily for fourteen days   Please take Tylenol every six hours as needed for pain   Please call Wednesday morning and tell Esmeralda and tell her how you feel, if you are not better then we will schedule a CT scan   Your physician has requested that you go to the basement for lab work before leaving today

## 2011-11-13 NOTE — Progress Notes (Signed)
Subjective:    Patient ID: Julie Dougherty, female    DOB: [birth year], 61 y.o.   MRN: [id number]  HPI Julie Dougherty is a pleasant 61 year old Hispanic female known to Dr. Leone Payor with history of diverticular disease and GERD she was last seen in the office in June of 2013 and at that time had been treated for an episode of diverticulitis initially with Cipro and Flagyl she did not tolerate and was then switched to Augmentin 875 twice daily. She seemed to tolerate this better and her symptoms resolved. She last had colonoscopy in March of 2011 noted to have moderate sigmoid diverticular disease and also irregular rectal mucosa which was biopsied improving to be mucosal prolapse. She says she's been doing well until about 3 days ago she developed recurrent left lower quadrant pain radiating around into her back and down into her leg. Says the pain has been fairly constant but not severe. She has not had any fever but has had chills no nausea or vomiting no dysuria urgency or frequency. She has had some constipation symptoms. She says this pain feels very similar to her prior diverticulitis  She had Augmentin at home from her previous episode and started on this 48 hours ago. She says she really does not feel better; certainly no worse.    Review of Systems  Constitutional: Positive for appetite change.  HENT: Negative.   Eyes: Negative.   Respiratory: Negative.   Cardiovascular: Negative.   Gastrointestinal: Positive for abdominal pain and abdominal distention.  Genitourinary: Negative.   Musculoskeletal: Positive for back pain.  Neurological: Negative.   Hematological: Negative.   Psychiatric/Behavioral: Negative.    Outpatient Prescriptions Prior to Visit  Medication Sig Dispense Refill  . ezetimibe (ZETIA) 10 MG tablet Take 10 mg by mouth daily.      . irbesartan (AVAPRO) 150 MG tablet Take 150 mg by mouth at bedtime.      . niacin (NIASPAN) 1000 MG CR tablet Take 1,000 mg by mouth at bedtime.        . pantoprazole (PROTONIX) 40 MG tablet Take 40 mg by mouth daily.      Marland Kitchen aspirin EC 325 MG tablet Take 325 mg by mouth daily.      . bifidobacterium infantis (ALIGN) capsule Take 1 capsule by mouth daily.  28 capsule  0  . HYDROcodone-acetaminophen (NORCO/VICODIN) 5-325 MG per tablet Take 1 tablet by mouth every 4 (four) hours as needed for pain.  12 tablet  0  . Niacin, Antihyperlipidemic, (NIASPAN PO) Take 325 mg by mouth daily.      . pravastatin (PRAVACHOL) 20 MG tablet Take 20 mg by mouth daily.      . promethazine (PHENERGAN) 25 MG tablet Take 25 mg by mouth every 6 (six) hours as needed.      . sulfamethoxazole-trimethoprim (SEPTRA DS) 800-160 MG per tablet Take 1 tablet by mouth 2 (two) times daily. X 7 days  14 tablet  0       Allergies  Allergen Reactions  . Ciprofloxacin     nausea  . Flagyl (Metronidazole)     nausea  . Statins    Patient Active Problem List  Diagnosis  . HYPERLIPIDEMIA  . HYPERTENSION, ESSENTIAL NOS  . GERD  . ABDOMINAL PAIN, RIGHT LOWER QUADRANT  . HEPATITIS B, HX OF  . Diverticulitis  . Hemorrhoids, internal, with bleeding   History   Social History  . Marital Status: Single    Spouse Name: N/A    Number  of Children: 3  . Years of Education: N/A   Occupational History  . Not on file.   Social History Main Topics  . Smoking status: Former Games developer  . Smokeless tobacco: Never Used   Comment: "when she was young"  . Alcohol Use: No  . Drug Use: No  . Sexually Active: Not on file   Other Topics Concern  . Not on file   Social History Narrative  . No narrative on file    Objective:   Physical Exam well-developed Hispanic female in no acute distress, accompanied by an interpreter blood pressure 118/74 pulse 104. HEENT; nontraumatic normocephalic EOMI PERRLA sclera, Neck;Supple no JVD, Cardiovascular; regular rate and rhythm with S1-S2 no murmur or gallop, Pulmonary; clear bilaterally, Abdomen ;soft bowel sounds are present she is  tender in the left mid quadrant and left lower quadrant no guarding no rebound no palpable mass or hepatosplenomegaly, Rectal; not done, Extremities; no clubbing cyanosis or edema, Psych; mood and affect normal and appropriate.        Assessment & Plan:  #80 61 year old female with recurrent sigmoid diverticulitis  Plan; continue Augmentin 875 mg by mouth twice daily to complete a 14 day course Offered an analgesic but she will use Tylenol as needed for pain Check CBC and BMET Lite more frequent feedings and push fluids She will call back in 48 hours with a progress report and at that time if no better we'll obtain CT of the abdomen and pelvis.

## 2011-11-13 NOTE — Progress Notes (Signed)
Agree with Ms. Esterwood's assessment and plan. Carl E. Gessner, MD, FACG   

## 2011-11-16 ENCOUNTER — Telehealth: Payer: Self-pay | Admitting: *Deleted

## 2011-11-16 ENCOUNTER — Other Ambulatory Visit: Payer: Self-pay | Admitting: *Deleted

## 2011-11-16 DIAGNOSIS — M549 Dorsalgia, unspecified: Secondary | ICD-10-CM

## 2011-11-16 DIAGNOSIS — R109 Unspecified abdominal pain: Secondary | ICD-10-CM

## 2011-11-16 DIAGNOSIS — K5790 Diverticulosis of intestine, part unspecified, without perforation or abscess without bleeding: Secondary | ICD-10-CM

## 2011-11-16 NOTE — Telephone Encounter (Signed)
You have been scheduled for a CT scan of the abdomen and pelvis at [facility] ([address]---this is in the same building as Architectural technologist).   You are scheduled on 11-17-2011 at 230 pm. You should arrive 15 minutes prior to your appointment time for registration. Please follow the written instructions below on the day of your exam:  WARNING: IF YOU ARE ALLERGIC TO IODINE/X-RAY DYE, PLEASE NOTIFY RADIOLOGY IMMEDIATELY AT [phone]! YOU WILL BE GIVEN A 13 HOUR PREMEDICATION PREP.  1) Do not eat or drink anything after 10:30 am (4 hours prior to your test) 2) You have been given 2 bottles of oral contrast to drink. The solution may taste better if refrigerated, but do NOT add ice or any other liquid to this solution. Shake well before drinking.    Drink 1 bottle of contrast @ 12:30pm (2 hours prior to your exam)  Drink 1 bottle of contrast @ 1:30 pm (1 hour prior to your exam)  You may take any medications as prescribed with a small amount of water except for the following: Metformin, Glucophage, Glucovance, Avandamet, Riomet, Fortamet, Actoplus Met, Janumet, Glumetza or Metaglip. The above medications must be held the day of the exam AND 48 hours after the exam.  The purpose of you drinking the oral contrast is to aid in the visualization of your intestinal tract. The contrast solution may cause some diarrhea. Before your exam is started, you will be given a small amount of fluid to drink. Depending on your individual set of symptoms, you may also receive an intravenous injection of x-ray contrast/dye. Plan on being at [facility] for 30 minutes or long, depending on the type of exam you are having performed.  If you have any questions regarding your exam or if you need to reschedule, you may call the CT department at [phone] between the hours of 8:00 am and 5:00 pm,  Monday-Friday.  _________________________________________________________________________________________________________________________________________  This is Julie Dougherty's mother in law. Julie Dougherty was notified and verbalized understanding of the above appointment for patient because patient needs interpretation

## 2011-11-17 ENCOUNTER — Ambulatory Visit (INDEPENDENT_AMBULATORY_CARE_PROVIDER_SITE_OTHER)
Admission: RE | Admit: 2011-11-17 | Discharge: 2011-11-17 | Disposition: A | Discharge status: Home / Self Care | Payer: Self-pay | Source: Ambulatory Visit | Attending: Physician Assistant | Admitting: Physician Assistant

## 2011-11-17 DIAGNOSIS — R109 Unspecified abdominal pain: Secondary | ICD-10-CM

## 2011-11-17 DIAGNOSIS — K5730 Diverticulosis of large intestine without perforation or abscess without bleeding: Secondary | ICD-10-CM

## 2011-11-17 DIAGNOSIS — K5790 Diverticulosis of intestine, part unspecified, without perforation or abscess without bleeding: Secondary | ICD-10-CM

## 2011-11-17 DIAGNOSIS — M549 Dorsalgia, unspecified: Secondary | ICD-10-CM

## 2011-11-17 MED ORDER — IOHEXOL 300 MG/ML  SOLN
100.0000 mL | Freq: Once | INTRAMUSCULAR | Status: AC | PRN
  Administered 2011-11-17: 100 mL via INTRAVENOUS

## 2011-11-22 ENCOUNTER — Encounter (HOSPITAL_COMMUNITY): Payer: Self-pay | Admitting: Emergency Medicine

## 2011-11-22 ENCOUNTER — Emergency Department (HOSPITAL_COMMUNITY)
Admission: EM | Admit: 2011-11-22 | Discharge: 2011-11-23 | Disposition: A | Discharge status: Home / Self Care | Payer: Medicaid Other | Attending: Emergency Medicine | Admitting: Emergency Medicine

## 2011-11-22 DIAGNOSIS — Z79899 Other long term (current) drug therapy: Secondary | ICD-10-CM | POA: Insufficient documentation

## 2011-11-22 DIAGNOSIS — R109 Unspecified abdominal pain: Secondary | ICD-10-CM | POA: Insufficient documentation

## 2011-11-22 DIAGNOSIS — L509 Urticaria, unspecified: Secondary | ICD-10-CM | POA: Insufficient documentation

## 2011-11-22 DIAGNOSIS — K219 Gastro-esophageal reflux disease without esophagitis: Secondary | ICD-10-CM | POA: Insufficient documentation

## 2011-11-22 DIAGNOSIS — K5730 Diverticulosis of large intestine without perforation or abscess without bleeding: Secondary | ICD-10-CM | POA: Insufficient documentation

## 2011-11-22 DIAGNOSIS — E7800 Pure hypercholesterolemia, unspecified: Secondary | ICD-10-CM | POA: Insufficient documentation

## 2011-11-22 DIAGNOSIS — Z7982 Long term (current) use of aspirin: Secondary | ICD-10-CM | POA: Insufficient documentation

## 2011-11-22 DIAGNOSIS — I10 Essential (primary) hypertension: Secondary | ICD-10-CM | POA: Insufficient documentation

## 2011-11-22 DIAGNOSIS — Z8711 Personal history of peptic ulcer disease: Secondary | ICD-10-CM | POA: Insufficient documentation

## 2011-11-22 DIAGNOSIS — Z87891 Personal history of nicotine dependence: Secondary | ICD-10-CM | POA: Insufficient documentation

## 2011-11-22 DIAGNOSIS — K5900 Constipation, unspecified: Secondary | ICD-10-CM | POA: Insufficient documentation

## 2011-11-22 DIAGNOSIS — K7689 Other specified diseases of liver: Secondary | ICD-10-CM | POA: Insufficient documentation

## 2011-11-22 LAB — POCT I-STAT, CHEM 8
BUN: 8 mg/dL (ref 6–23)
Chloride: 103 mEq/L (ref 96–112)
Sodium: 141 mEq/L (ref 135–145)
TCO2: 26 mmol/L (ref 0–100)

## 2011-11-22 LAB — CBC
Hemoglobin: 12.8 g/dL (ref 12.0–15.0)
RBC: 4.41 MIL/uL (ref 3.87–5.11)

## 2011-11-22 MED ORDER — FAMOTIDINE IN NACL 20-0.9 MG/50ML-% IV SOLN
20.0000 mg | Freq: Once | INTRAVENOUS | Status: AC
  Administered 2011-11-22: 20 mg via INTRAVENOUS
  Filled 2011-11-22: qty 50

## 2011-11-22 MED ORDER — SODIUM CHLORIDE 0.9 % IV SOLN
Freq: Once | INTRAVENOUS | Status: AC
  Administered 2011-11-22: via INTRAVENOUS

## 2011-11-22 NOTE — ED Notes (Signed)
Pt states she also has bleeding when she gores to the bathroom.  Pt states she has a hx of diverticulitis and hemroids.

## 2011-11-22 NOTE — ED Provider Notes (Signed)
History     CSN: [id number]  Arrival date & time 11/22/11  2158   First MD Initiated Contact with Patient 11/22/11 2225      Chief Complaint  Patient presents with  . Urticaria    (Consider location/radiation/quality/duration/timing/severity/associated sxs/prior treatment) HPI Comments: Or patient reports, that she's been taking Augmentin for a diverticular flare for the past, week.  She has CT scan on Monday, which showed diverticular disease without abscess.  She reports she still having some blood in her stool, but presents tonight with urticaria, which has been a problem for her in the past.  She has never been worked up for an allergic reaction.  She spontaneously will develop hives were resolved with alcohol and they will resolve.  Tonight.  They're lasting longer than normal, and she developed a chill.  She also reports, that she has some discomfort, when she urinates on the left side, where she has diverticular pain  Patient is a 61 y.o. female presenting with urticaria. The history is provided by the patient and a relative.  Urticaria This is a recurrent problem. The current episode started today. The problem occurs intermittently. Associated symptoms include abdominal pain, chills and a rash. Pertinent negatives include no coughing, fever, nausea or vomiting.    Past Medical History  Diagnosis Date  . Hypertension   . High cholesterol   . Internal hemorrhoids   . Diverticulosis   . Hiatal hernia   . PUD (peptic ulcer disease)   . GERD (gastroesophageal reflux disease)   . Chronic constipation   . Diverticulitis   . Fatty liver     Past Surgical History  Procedure Date  . Abdominal hysterectomy   . Colonoscopy 04/27/09     POLYPOID FRAGMENT OF COLONIC MUCOSA WITH SURFACE  . Tubal ligation     Family History  Problem Relation Age of Onset  . Colon cancer Neg Hx   . Diabetes Sister     History  Substance Use Topics  . Smoking status: Former Games developer  .  Smokeless tobacco: Never Used   Comment: "when she was young"  . Alcohol Use: No    OB History    Grav Para Term Preterm Abortions TAB SAB Ect Mult Living                  Review of Systems  Constitutional: Positive for chills. Negative for fever.  Respiratory: Negative for cough and shortness of breath.   Gastrointestinal: Positive for abdominal pain and blood in stool. Negative for nausea and vomiting.  Skin: Positive for rash. Negative for wound.  Neurological: Negative for dizziness.    Allergies  Ciprofloxacin; Flagyl; and Statins  Home Medications   Current Outpatient Rx  Name Route Sig Dispense Refill  . AMOXICILLIN-POT CLAVULANATE 875-125 MG PO TABS Oral Take 1 tablet by mouth 2 (two) times daily.    . ASPIRIN EC 81 MG PO TBEC Oral Take 81 mg by mouth daily.    Marland Kitchen EZETIMIBE 10 MG PO TABS Oral Take 10 mg by mouth daily.    . IRBESARTAN 150 MG PO TABS Oral Take 150 mg by mouth daily.     Marland Kitchen NIACIN ER (ANTIHYPERLIPIDEMIC) 1000 MG PO TBCR Oral Take 1,000 mg by mouth daily.     Marland Kitchen PANTOPRAZOLE SODIUM 40 MG PO TBEC Oral Take 40 mg by mouth daily.    Marland Kitchen FAMOTIDINE 40 MG PO TABS Oral Take 1 tablet (40 mg total) by mouth 2 (two) times daily as  needed for heartburn. 30 tablet 0    Hives/flushing    BP 129/72  Pulse 114  Temp 98.2 F (36.8 C) (Oral)  Resp 24  SpO2 100%  Physical Exam  Constitutional: She is oriented to person, place, and time. She appears well-developed.  HENT:  Head: Normocephalic.  Eyes: Pupils are equal, round, and reactive to light.  Neck: Normal range of motion.  Cardiovascular: Regular rhythm.  Tachycardia present.   Pulmonary/Chest: Effort normal. No respiratory distress. She has no wheezes.  Abdominal: Soft. Bowel sounds are normal. She exhibits no distension. There is tenderness in the left upper quadrant and left lower quadrant.  Musculoskeletal: She exhibits no edema and no tenderness.  Neurological: She is alert and oriented to person, place,  and time.  Skin: Rash noted.    ED Course  Procedures (including critical care time)  Labs Reviewed  URINALYSIS, ROUTINE W REFLEX MICROSCOPIC - Abnormal; Notable for the following:    Leukocytes, UA SMALL (*)     All other components within normal limits  URINE MICROSCOPIC-ADD ON - Abnormal; Notable for the following:    Squamous Epithelial / LPF FEW (*)     All other components within normal limits  CBC  POCT I-STAT, CHEM 8   No results found.   1. Full body hives   2. Abdominal pain       MDM           Arman Filter, NP 11/23/11 [phone]

## 2011-11-22 NOTE — ED Notes (Addendum)
Pt present to the ED complains of chest pain that presents as a burning sensation.  She states that she becomes red and diaphoretic several times in the last 24 hours.  Pt has high blood pressure. Upon further investigation patient states that she has diverticulitis and has been on an antibiotic regiment and that this coincides with the appearance of her rash and chills.

## 2011-11-23 LAB — URINALYSIS, ROUTINE W REFLEX MICROSCOPIC
Glucose, UA: NEGATIVE mg/dL
Specific Gravity, Urine: 1.013 (ref 1.005–1.030)
pH: 7 (ref 5.0–8.0)

## 2011-11-23 LAB — URINE MICROSCOPIC-ADD ON

## 2011-11-23 MED ORDER — FAMOTIDINE 40 MG PO TABS: 40.0000 mg | ORAL_TABLET | Freq: Two times a day (BID) | ORAL | Status: DC | PRN

## 2011-11-23 NOTE — ED Provider Notes (Signed)
Medical screening examination/treatment/procedure(s) were performed by non-physician practitioner and as supervising physician I was immediately available for consultation/collaboration.   [employer] L Linette Gunderson, MD 11/23/11 0634 

## 2012-01-30 ENCOUNTER — Telehealth: Payer: Self-pay | Admitting: Internal Medicine

## 2012-01-30 DIAGNOSIS — K921 Melena: Secondary | ICD-10-CM

## 2012-01-30 NOTE — Telephone Encounter (Signed)
CBC later today or earlier on 1/2 so have before visit

## 2012-01-30 NOTE — Telephone Encounter (Signed)
Patient has upper abdominal pain and black stools for 3 days.  She thought this was her diverticulitis and old food so she started herself on antibiotics she had on hand.  i spoke with her daughter-in-law Es and she is advised to have her double up on omeprazole and come for an appt on 02/01/11 1:45 with Dr. Leone Payor.  She denies recent bismuth products or iron supplements.  She does not feel lightheaded or dizzy.  Dr. Leone Payor do you have any additional orders until Thursday?

## 2012-01-30 NOTE — Telephone Encounter (Signed)
Es will bring her for CBC today

## 2012-02-01 ENCOUNTER — Encounter: Payer: Self-pay | Admitting: Internal Medicine

## 2012-02-01 ENCOUNTER — Ambulatory Visit (INDEPENDENT_AMBULATORY_CARE_PROVIDER_SITE_OTHER): Payer: Medicaid Other | Admitting: Internal Medicine

## 2012-02-01 ENCOUNTER — Other Ambulatory Visit (INDEPENDENT_AMBULATORY_CARE_PROVIDER_SITE_OTHER): Payer: Self-pay

## 2012-02-01 VITALS — BP 114/80 | HR 72 | Ht 60.0 in | Wt 186.4 lb

## 2012-02-01 DIAGNOSIS — K648 Other hemorrhoids: Secondary | ICD-10-CM

## 2012-02-01 DIAGNOSIS — R195 Other fecal abnormalities: Secondary | ICD-10-CM

## 2012-02-01 DIAGNOSIS — K5900 Constipation, unspecified: Secondary | ICD-10-CM

## 2012-02-01 DIAGNOSIS — K5732 Diverticulitis of large intestine without perforation or abscess without bleeding: Secondary | ICD-10-CM

## 2012-02-01 DIAGNOSIS — K921 Melena: Secondary | ICD-10-CM

## 2012-02-01 LAB — CBC WITH DIFFERENTIAL/PLATELET
Basophils Absolute: 0.1 10*3/uL (ref 0.0–0.1)
Hemoglobin: 13 g/dL (ref 12.0–15.0)
Lymphocytes Relative: 44.8 % (ref 12.0–46.0)
Monocytes Relative: 8.4 % (ref 3.0–12.0)
Neutro Abs: 3 10*3/uL (ref 1.4–7.7)
RBC: 4.45 Mil/uL (ref 3.87–5.11)
RDW: 13 % (ref 11.5–14.6)

## 2012-02-01 MED ORDER — POLYETHYLENE GLYCOL 3350 17 GM/SCOOP PO POWD: 17.0000 g | Freq: Every day | ORAL | Status: DC

## 2012-02-01 MED ORDER — HYDROCORTISONE 2.5 % RE CREA: TOPICAL_CREAM | Freq: Two times a day (BID) | RECTAL | Status: DC

## 2012-02-01 MED ORDER — AMOXICILLIN-POT CLAVULANATE 875-125 MG PO TABS: 1.0000 | ORAL_TABLET | Freq: Two times a day (BID) | ORAL | Status: DC

## 2012-02-01 NOTE — Patient Instructions (Addendum)
Hemorroides  (Hemorrhoids) Las hemorroides son venas agrandadas (dilatadas) alrededor del recto. Hay 2 tipos de hemorroides, que se determina por su ubicacin. Las hemorroides internas, que son las que se encuentran dentro del recto.Generalmente no duelen, Charity fundraiser.Sin embargo, pueden hincharse y salir por el recto, entonces se irritan y duelen. Las hemorroides externas incluyen las venas externas del ano, y se sienten como un bulto duro y que duele, cerca del ano.Muchas veces pican, y pueden romperse y Geophysicist/field seismologist. En algunos casos se forman cogulos en las venas. Esto hace que se hinchen y duelan. Esto se suele denominar trombosis hemorroidal.  CAUSAS  Las causas de hemorroides son:   Vanetta Mulders. El embarazo aumenta la presin en las venas hemorroidales.  Constipacin.  Dificultad para mover el intestino.  Obesidad.  Levantar pesas u otras actividades que impliquen esfuerzo. TRATAMIENTO  La mayora de los casos de hemorroides mejoran en 1 a 2 semanas. Sin embargo, si los sntomas no mejoran o tiene Runner, broadcasting/film/video, el mdico puede Education officer, environmental un procedimiento para disminuir las hemorroides o extirparlas completamente.Los tratamientos posibles son:   Abigail Butts con Neomia Dear banda de goma. Se coloca una banda de goma en la base de la hemorroides para cortar la circulacin.  Escleroterapia Se inyecta una sustancia qumica para disminuir el tamao de la hemorroides.  Terapia con luz infrarroja. Se utiliza un instrumento para quemar la hemorroides.  Hemorroidectoma. Es la remocin [place] de la hemorroides. INSTRUCCIONES PARA EL CUIDADO EN EL HOGAR   Agregue fibra a su dieta. Consulte con su mdico acerca del uso de suplementos con fibras.  Beba gran cantidad de lquido para mantener la orina de tono claro o color amarillo plido.  Haga ejercicios regularmente.  Vaya al bao cuando sienta la necesidad de mover el intestino. No espere.  Evite hacer fuerza al mover el  intestino.  Mantenga la zona anal limpia y seca.  Solo tome medicamentos que se pueden comprar sin receta o recetados para Chief Technology Officer, Dentist o fiebre, como le indica el mdico. Si la hemorroides est trombosada:   Tome baos de asiento calientes durante 20 a 30 minutos, 3 a 4 veces por da.  Si le duele y est hinchada, coloque compresas con hielo en la zona, segn la tolerancia. Usar las compresas de [employer] baos de asiento puede ser [place]. Llene una bolsa plstica con hielo. Coloque una toalla entre la bolsa de hielo y la piel.  Puede usar o Contractor segn las indicaciones algunas cremas especiales y supositorios.  No utilice una almohada en forma de aro ni se siente en el inodoro durante perodos prolongados. Esto aumenta la afluencia de sangre y Chief Technology Officer. SOLICITE ATENCIN MDICA SI:   Aumenta el dolor y la hinchazn, y no puede controlarlo con Tourist information centre manager.  Tiene un sangrado que no puede parar.  No puede mover el intestino.  Siente dolor o tiene inflamacin fuera de la zona de las hemorroides.  Tiene escalofros o una temperatura oral mayor a 102 F (38.9 C). ASEGRESE DE QUE:   Comprende estas instrucciones.  Controlar su enfermedad.  Solicitar ayuda de inmediato si no mejora o si empeora. Document Released: 01/16/2005 Document Revised: 07/18/2011 [facility] Patient Information 2013 [place], [place].  Diverticulitis (Diverticulitis)  Un divertculo es una pequea bolsa o saco en el colon. La diverticulosis es la presencia de divertculos en el colon. Es la irritacin (inflamacin) o infeccin de los divertculos.  CAUSAS Ciertos grmenes (bacterias) colonizan el colon y los divertculos. Si algunas partculas de alimento obstruyen  la pequea abertura de un divertculo, las bacterias que existen en su interior se multiplican y causan un aumento de la presin. Esto produce la infeccin y la inflamacin denominada diverticulitis. SNTOMAS  Dolor y Associate Professor  en el vientre (abdomen). Por lo general el dolor se ubica en la zona inferior izquierda del abdomen. Sin embargo, podra Sales promotion account executive.  Grant Ruts.  Hinchazn.  Ganas de vomitar (nuseas).  Vmitos.  Heces anormales. DIAGNSTICO La historia clnica y el examen fsico ayudarn a diagnosticar la enfermedad. Ser necesario realizar otras pruebas ya que muchas cosas pueden causar dolor abdominal. Estas pruebas son:  Arna Snipe.  Pruebas de [place].  Radiografas de abdomen.  Tomografas computarizadas de abdomen. En algunos casos se indicar ciruga para determinar si los sntomas se deben a diverticulitis o a otros trastornos. TRATAMIENTO En la mayor parte de los casos se trata sin [place]. El tratamiento incluye:  Reposo del intestino restringiendo la alimentacin slo a lquidos durante [employer]. Cuando mejore, deber seguir una dieta baja en fibras.  Si est deshidratado le administrarn lquidos por va endovenosa.  Le indicarn antibiticos para tratar la infeccin.  Medicamentos para Chief Technology Officer y las nuseas en caso de ser necesario.  Se indica la ciruga si el divertculo inflamado se ha perforado. INSTRUCCIONES PARA EL CUIDADO DOMICILIARIO  Siga una dieta lquida (caldo, t o agua durante el tiempo que se lo indique su mdico). Podr gradualmente comenzar una dieta baja en fibras segn lo que tolere. Una dieta baja en fibras es la que contiene menos de 10 gramos de [place]. Elija entre los alimentos a continuacin para reducir la cantidad de fibra de la dieta:  Pan blanco, cereales, arroz y pastas.  Nils Pyle y vegetales cocidos o frutas frescas blandas y vegetales sin piel.  Carne molida o un bife tierno bien cocido, jamn, ternera, cordero, cerdo o aves.  Huevos y frutos de mar.  Luego que los sntomas de la diverticulitis hayan mejorado, el mdico podr indicarle una dieta con ms cantidad [place]. Este tipo de dieta incluye 14 gramos de fibra  cada 1000 caloras consumidas. Para una dieta estndar de 2000 caloras se necesitan 28 gramos de [place]. Siga las pautas para esta dieta para aumentar la ingesta de [place]. Es importante que aumente lentamente la cantidad de fibra para evitar los gases, la constipacin y la hinchazn.  Elija panes, cereales, pastas con salvado y Cristino Martes.  Elija frutas y vegetales frescos con su piel. No cocine demasiado los vegetales porque cunto ms los cocine, ms fibra pierden.  Consuma ms frutos secos, semillas, legumbres, arvejas secas, lentejas.  Busque en las etiquetas de Informacin Nutricional, los productos que tengan ms de 3 gramos de fibra por porcin.  Tome todos los medicamentos tal como se lo indic el profesional que lo asiste.  Si el mdico le ha dado fecha para una visita de control, es importante que concurra. No cumplir con este control puede dar como resultado que el dao, el dolor o la discapacidad sean permanentes(crnicos) . Si hay algn problema para cumplir con los controles, comunquelo a su mdico. SOLICITE ATENCIN MDICA SI:  El dolor no desaparece segn lo esperado.  No puede pasar de la dieta lquida.  Los movimientos intestinales no vuelven a la normalidad. SOLICITE ATENCIN MDICA DE INMEDIATO SI:  El dolor se agrava.  Usted tiene una temperatura oral de ms de 102 F (38.9 C) y no puede controlarla con medicamentos.  Presenta vmitos repetidas veces.  Observa la  materia fecal tiene aspecto negro alquitranado o contiene sangre de color rojo brillante.  Si los sntomas que lo trajeron a Stage manager o no mejoran. EST SEGURO QUE:   Comprende las instrucciones para el alta mdica.  Controlar su enfermedad.  Solicitar atencin mdica de inmediato segn las indicaciones. Document Released: 10/26/2004 Document Revised: 04/10/2011 [facility] Patient Information 2013 [place], [place].  Finish current course of Augmentin.  We have sent a refill to your  pharmacy to have if you need it.  If you have to take this call and let us know.  Start Miralax, rx sent to pharmacy.  Thank you for choosing me and [facility].  Iva Boop, M.D., [facility]

## 2012-02-01 NOTE — Progress Notes (Signed)
  Subjective:    Patient ID: Julie Dougherty, female    DOB: [birth year], 62 y.o.   MRN: [id number]  HPI Recurrent LLQ pain like diverticulitis Dark stools and some rectal bleeding also Started Augmentin which was on hand and better   Review of Systems L sciatica also, increased    Objective:   Physical Exam General:  NAD Eyes:   anicteric Lungs:  clear Heart:  S1S2 no rubs, murmurs or gallops Abdomen:  soft and mildly tender LLQ, BS+ Ext:   no edema Musck  + straight leg raise on left    Data Reviewed:   Lab Results  Component Value Date   WBC 7.0 02/01/2012   HGB 13.0 02/01/2012   HCT 38.6 02/01/2012   MCV 86.8 02/01/2012   PLT 251.0 02/01/2012          Assessment & Plan:   1. Diverticulitis large intestine   2. Hemorrhoids, internal, with bleeding   3. Constipation    1. Finish Augmentin 2. Refill of Augmentin to have on hand 3. Miralax qd 4. Proctocream HC 5. See me 1 month 6. Did discuss possible elective colon resection but not interested at this time

## 2012-03-04 ENCOUNTER — Ambulatory Visit: Payer: Medicaid Other | Admitting: Internal Medicine

## 2012-05-10 ENCOUNTER — Ambulatory Visit (INDEPENDENT_AMBULATORY_CARE_PROVIDER_SITE_OTHER): Payer: Self-pay | Admitting: Internal Medicine

## 2012-05-10 ENCOUNTER — Encounter: Payer: Self-pay | Admitting: Internal Medicine

## 2012-05-10 VITALS — BP 128/64 | HR 86 | Ht 60.0 in | Wt 189.0 lb

## 2012-05-10 DIAGNOSIS — M1990 Unspecified osteoarthritis, unspecified site: Secondary | ICD-10-CM

## 2012-05-10 DIAGNOSIS — R0789 Other chest pain: Secondary | ICD-10-CM

## 2012-05-10 DIAGNOSIS — K5732 Diverticulitis of large intestine without perforation or abscess without bleeding: Secondary | ICD-10-CM

## 2012-05-10 DIAGNOSIS — R071 Chest pain on breathing: Secondary | ICD-10-CM

## 2012-05-10 MED ORDER — POLYETHYLENE GLYCOL 3350 17 GM/SCOOP PO POWD: 17.0000 g | Freq: Every day | ORAL | Status: DC

## 2012-05-10 MED ORDER — NAPROXEN 500 MG PO TABS: 500.0000 mg | ORAL_TABLET | Freq: Two times a day (BID) | ORAL | Status: DC

## 2012-05-10 MED ORDER — AMOXICILLIN-POT CLAVULANATE 875-125 MG PO TABS: 1.0000 | ORAL_TABLET | Freq: Two times a day (BID) | ORAL | Status: AC

## 2012-05-10 NOTE — Patient Instructions (Addendum)
We have sent the following medications to your pharmacy for you to pick up at your convenience: Naprosyn ( Take for 2 weeks if better may stop if not take for another 2 weeks),  miralax   Today we are giving you a printed rx for Augmentin.  Thank you for choosing me and  Gastroenterology.  Iva Boop, M.D., [facility]

## 2012-05-10 NOTE — Progress Notes (Signed)
  Subjective:    Patient ID: Julie Dougherty, female    DOB: 08-Mar-1950, 62 y.o.   MRN: [id number]  HPI The patient presents with an interpreter and her daughter-in-law. She's had several days of bilateral upper quadrant flank pain worse on the right. She feels like her side is more distended than the left. She is moving her bowels well wheezing MiraLax. Her diverticulitis has not flared again. No Nausea or vomiting. No injury or trauma. No urinary problem He does still have occasional rectal bleeding with known hemorrhoids.  Medications, allergies, past medical history, past surgical history, family history and social history are reviewed and updated in the EMR.  Review of Systems As above    Objective:   Physical Exam General:  NAD Eyes:   anicteric Lungs:  clear  Abdomen:  soft and nontender, BS+  chest wall is tender laterally, she has bilateral right greater than left CVA tenderness as well. Ext:   no edema       Assessment & Plan:   1. Chest wall pain   2. Osteoarthritis   3. Diverticulitis of colon without hemorrhage    1. The proximal and 500 mg twice a day for chest wall pain 2-4 weeks therapy 2. Prescription for Augmentin 875 twice a day #20 to have on hand in case her diverticulitis returns. She knows to call me if she needs to take this medication. 3. Return visit as needed otherwise.

## 2013-01-03 ENCOUNTER — Ambulatory Visit (INDEPENDENT_AMBULATORY_CARE_PROVIDER_SITE_OTHER): Payer: Medicaid Other | Admitting: Physician Assistant

## 2013-01-03 ENCOUNTER — Telehealth: Payer: Self-pay | Admitting: Internal Medicine

## 2013-01-03 ENCOUNTER — Other Ambulatory Visit (INDEPENDENT_AMBULATORY_CARE_PROVIDER_SITE_OTHER): Payer: Medicaid Other

## 2013-01-03 ENCOUNTER — Encounter: Payer: Self-pay | Admitting: *Deleted

## 2013-01-03 VITALS — BP 116/72 | HR 84 | Ht 62.0 in | Wt 196.0 lb

## 2013-01-03 DIAGNOSIS — K648 Other hemorrhoids: Secondary | ICD-10-CM

## 2013-01-03 DIAGNOSIS — K5732 Diverticulitis of large intestine without perforation or abscess without bleeding: Secondary | ICD-10-CM

## 2013-01-03 DIAGNOSIS — R109 Unspecified abdominal pain: Secondary | ICD-10-CM

## 2013-01-03 LAB — CBC WITH DIFFERENTIAL/PLATELET
Basophils Relative: 1 % (ref 0.0–3.0)
Eosinophils Relative: 1.6 % (ref 0.0–5.0)
HCT: 39.6 % (ref 36.0–46.0)
Hemoglobin: 13.1 g/dL (ref 12.0–15.0)
Lymphs Abs: 2.9 10*3/uL (ref 0.7–4.0)
Monocytes Relative: 8 % (ref 3.0–12.0)
Neutro Abs: 4.3 10*3/uL (ref 1.4–7.7)
RBC: 4.55 Mil/uL (ref 3.87–5.11)
RDW: 13.4 % (ref 11.5–14.6)
WBC: 8.1 10*3/uL (ref 4.5–10.5)

## 2013-01-03 MED ORDER — AMOXICILLIN-POT CLAVULANATE 875-125 MG PO TABS: 1.0000 | ORAL_TABLET | Freq: Two times a day (BID) | ORAL | Status: DC

## 2013-01-03 MED ORDER — HYDROCORTISONE ACETATE 25 MG RE SUPP: 25.0000 mg | Freq: Every day | RECTAL | Status: DC

## 2013-01-03 NOTE — Telephone Encounter (Signed)
Patient's daughter in law reports abdominal pain for several days.  They will come in and see Amy Esterwood PA today at 3:00

## 2013-01-03 NOTE — Patient Instructions (Addendum)
We have sent the following medications to your pharmacy for you to pick up at your convenience: Augmentin, Anusol  Make sure to take Augmentin with food Take tylenol as needed Your physician has requested that you go to the basement for the following lab work before leaving today: CBC  Call if no better by Wednesday. CC:  Yisroel Ramming MD

## 2013-01-03 NOTE — Progress Notes (Signed)
Subjective:    Patient ID: Julie Dougherty, female    DOB: [birth year], 62 y.o.   MRN: [id number]  HPI  Raelynne is a pleasant 62 year old Hispanic female known to Dr. Leone Payor. She does not speak much English and her daughter-in-law interprets  for her.She has history of diverticulosis and prior episodes of diverticulitis. History is also positive for hypertension, hyperlipidemia, and internal hemorrhoids. She last underwent colonoscopy in March of 2011 with finding of moderate diverticulosis and irregular rectal mucosa as well as internal hemorrhoids. 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 Patient comes in today with complaints of onset of left-sided abdominal pain on Sunday, November 30. She says she is hurting up higher in her left side than she usually does with diverticulitis. Pain has persisted over the past 6 days and has been associated with bloating and a decrease in appetite. She has had some mild nausea no vomiting. She apparently started seeing bright red blood in her stool on Sunday and saw blood with bowel movements over the next 3 days. She says yesterday and today she has not noticed any blood. She has had some mild internal rectal discomfort with bowel movements. It is difficult to get a sense of how much blood she's passing but she has  seen bright red blood on the tissue and in the commode. Again no blood over the past 2 daysThere's been no fever or chills.  Review of Systems  Constitutional: Positive for appetite change.  HENT: Negative.   Eyes: Negative.   Respiratory: Negative.   Cardiovascular: Negative.   Gastrointestinal: Positive for abdominal pain, blood in stool, abdominal distention and rectal pain.  Endocrine: Negative.   Genitourinary: Negative.   Musculoskeletal: Negative.   Allergic/Immunologic: Negative.   Neurological: Negative.   Hematological: Negative.   Psychiatric/Behavioral: Negative.    Outpatient  Prescriptions Prior to Visit  Medication Sig Dispense Refill  . aspirin EC 81 MG tablet Take 81 mg by mouth daily.      . hydrocortisone (ANUSOL-HC) 2.5 % rectal cream Place rectally 2 (two) times daily as needed. Use regularly for 1 week then as needed      . irbesartan (AVAPRO) 150 MG tablet Take 150 mg by mouth daily.       Marland Kitchen losartan (COZAAR) 100 MG tablet Take 100 mg by mouth daily.      . naproxen (NAPROSYN) 500 MG tablet Take 1 tablet (500 mg total) by mouth 2 (two) times daily with a meal.  60 tablet  0  . pantoprazole (PROTONIX) 40 MG tablet Take 40 mg by mouth daily.      . polyethylene glycol powder (MIRALAX) powder Take 17 g by mouth daily.  255 g  11  . pravastatin (PRAVACHOL) 20 MG tablet Take 20 mg by mouth daily.       No facility-administered medications prior to visit.   Allergies  Allergen Reactions  . Ciprofloxacin Hives    nausea  . Flagyl [Metronidazole] Hives    nausea      Patient Active Problem List   Diagnosis Date Noted  . Hemorrhoids, internal, with bleeding 06/30/2011  . Diverticulitis 06/06/2011  . ABDOMINAL PAIN, RIGHT LOWER QUADRANT 04/19/2009  . HYPERLIPIDEMIA 07/11/2006  . HYPERTENSION, ESSENTIAL NOS 07/11/2006  . GERD 07/11/2006  . HEPATITIS B, HX OF 07/11/2006   History  Substance Use Topics  . Smoking status: Former Games developer  . Smokeless tobacco: Never Used     Comment: "when  she was young"  . Alcohol Use: No   family history includes Diabetes in her sister. There is no history of Colon cancer.    . Objective:   Physical Exam  Wd older Hispanic female in no acute distress, pleasant- her daughter-in-law interprets. Blood pressure 116/72 pulse 84 height 5 foot 2 weight 196. HEENT;nontraumatic normocephalic EOMI PERRLA sclera anicteric, Supple; no JVD, Cardiovascular; regular rate and rhythm with S1-S2 no murmur or gallop, Pulmonary; clear bilaterally, Abdomen;  Large, soft, she is tender in the left upper quadrant and left lower quadrant is  no guarding or rebound no palpable mass or hepatosplenomegaly bowel sounds are active, Rectal ;exam shows an external hemorrhoidal tag and on anoscopy he has a friable internal hemorrhoid with slight oozing of heme, Extremities; no clubbing cyanosis or edema skin warm and dry, Psych ;mood and affect normal and appropriate        Assessment & Plan:  #76  62 year old Hispanic female with recurrent left-sided abdominal pain x6 days. Patient has also been seen bright red blood with her bowel movements History is somewhat difficult due to language barrier, I do not think she has had an episode of ischemic colitis though that is possible History exam or consistent with recurrent diverticulitis and bleeding secondary to internal hemorrhoids.  Plan; CBC today Patient's daughter-in-law is cautioned to call should she have recurrent bleeding especially in larger amounts. Start Augmentin 875 twice daily with food x10 days Tylenol as needed for abdominal pain Anusol-HC suppositories each bedtime x7 days then as needed for recurrent bleeding. Follow up with Dr. Leone Payor is needed, patient and daughter in law aware to call if she's not significantly improved in the next 4-5 days.

## 2013-01-03 NOTE — Progress Notes (Signed)
Agree with Ms. Esterwood's assessment and plan. Korbyn Chopin E. Aretha Levi, MD, FACG   

## 2013-03-07 ENCOUNTER — Emergency Department (HOSPITAL_COMMUNITY)
Admission: EM | Admit: 2013-03-07 | Discharge: 2013-03-07 | Disposition: A | Discharge status: Home / Self Care | Payer: Medicaid Other | Source: Home / Self Care

## 2013-03-07 ENCOUNTER — Encounter (HOSPITAL_COMMUNITY): Payer: Self-pay | Admitting: Emergency Medicine

## 2013-03-07 DIAGNOSIS — J111 Influenza due to unidentified influenza virus with other respiratory manifestations: Secondary | ICD-10-CM

## 2013-03-07 DIAGNOSIS — R509 Fever, unspecified: Secondary | ICD-10-CM

## 2013-03-07 MED ORDER — ACETAMINOPHEN 325 MG PO TABS
ORAL_TABLET | ORAL | Status: AC
  Filled 2013-03-07: qty 3

## 2013-03-07 MED ORDER — ACETAMINOPHEN 325 MG PO TABS
650.0000 mg | ORAL_TABLET | Freq: Once | ORAL | Status: AC
  Administered 2013-03-07: 1000 mg via ORAL

## 2013-03-07 MED ORDER — OSELTAMIVIR PHOSPHATE 75 MG PO CAPS: 75.0000 mg | ORAL_CAPSULE | Freq: Two times a day (BID) | ORAL | Status: DC

## 2013-03-07 NOTE — Discharge Instructions (Signed)
Julie Dougherty, Adultos  (Fever, Adult) La fiebre es la temperatura superior a la normal del cuerpo. En el adulto, una temperatura normal es de alrededor de 98,6 F (37 C). Cuando la temperatura es de 100,4 F (38 C) o ms, se considera fiebre. Una fiebre entre leve y moderada generalmente no presenta efectos a largo plazo y no requiere TEFL teachertratamiento. Una fiebre extrema (mayor o igual a 106 F o 41,1 C) puede causar convulsiones. La sudoracin que ocurre en la fiebre repetida o prolongada puede causar deshidratacin. Los MGM MIRAGEancianos pueden presentar confusin Energy Transfer Partnersdurante los episodios de Modalefiebre.  La medicin de la temperatura puede variar con:   La edad.  El momento del da.  El modo en que se mide (boca, axila, recto u odo). La fiebre se confirma tomando la temperatura con un termmetro. La temperatura puede tomarse de diferentes modos. Algunos mtodos son precisos y otros no lo son.   Generalmente se toma la temperatura oral. Los termmetros electrnicos son rpidos y Insurance claims handlerprecisos.  La temperatura tomada en el odo solo ser precisa si el termmetro se ubica segn las indicaciones del fabricante.  La temperatura rectal es precisa y se toma en aquellos adultos que tienen una enfermedad en la que no puede tomarse la temperatura oral.  La temperatura que se toma debajo del brazo Administrator, Civil Service(axilar) no es precisa y no se recomienda. La fiebre es un sntoma, no es una enfermedad.  CAUSAS   Generalmente las infecciones causan fiebre.  Las causas de fiebre sin infeccin pueden ser:  General Electriclgunos casos de artritis.  Ciertas enfermedades de la glndula tiroides o suprarrenal.  Algunas enfermedades del sistema inmunolgico.  Ciertos tipos de cncer.  La reaccin a un medicamento.  Consumir dosis elevadas de ciertas drogas como la metamfetamina.  Deshidratacin.  Exposicin a temperaturas elevadas en el exterior o en un ambiente.  En ocasiones, la causa de la fiebre no puede determinarse. En estos casos se denomina  "fiebre de origen desconocido" (FOD).  Algunas situaciones pueden llevar a un aumento temporario de Therapist, nutritionalla temperatura corporal, que puede desaparecer sin tratamiento. Algunos ejemplos son:  El parto  Bos[place] and Herzegovinana ciruga.  Actividad fsica intensa. INSTRUCCIONES PARA EL CUIDADO EN EL HOGAR   CenterPoint Energyome los medicamentos adecuados para la fiebre. Siga atentamente las instrucciones relacionadas con la dosis. Si utiliza acetaminofeno para bajar la fiebre, tenga la precaucin de Automotive engineerevitar tomar otros medicamentos que tambin contengan acetaminofeno. Los menores de 19 aos no deben tomar aspirina para Oncologistbajar la fiebre. Hay una asociacin con el sndrome de Re[place]. El sndrome de [place] es una enfermedad rara pero potencialmente fatal.  Si sufre una infeccin y le han recetado antibiticos, tmelos como se le ha indicado. Tmelos todos, aunque se sienta mejor.  Descanse todo lo que pueda.  Mantenga una adecuada ingesta de lquidos. Para evitar la deshidratacin durante una enfermedad con fiebre prolongada o recurrente, puede necesitar tomar lquidos extra.Debe ingerir la cantidad de lquido suficiente como para Pharmacologistmantener la orina de tono claro o color amarillo plido.  Pasar una esponja o un bao con agua a temperatura ambiente puede ayudar a reducir Therapist, nutritionalla temperatura corporal. No use agua con hielo ni pase esponjas con alcohol fino.  Vstase de manera cmoda pero no se abrigue mucho. SOLICITE ATENCIN MDICA SI:   No puede retener lquidos.  Tiene vmitos o diarrea.  No mejora, aunque sea parcialmente, luego de 3 das.  Desarrolla nuevos e inexplicables sntomas o problemas. SOLICITE ATENCIN MDICA DE INMEDIATO SI:   Le falta el aire o tiene dificultad  para respirar.  Se siente cada vez ms dbil.  Se siente mareado o se desmaya.  Tiene mucha sed, orina poco o no orina en absoluto.  Siente nuevos dolores que no haba sentido antes (como dolor de [place], [place], pecho, espalda o abdomen).  Tiene vmitos  persistentes y diarrea durante ms de 1  2 das.  Siente rigidez en el cuello o sus ojos tienen sensibilidad a Naval architect.  Aparece una erupcin cutnea.  Tiene fiebre o sntomas que persisten durante ms de 2  3 das.  Tiene fiebre y los sntomas empeoran de manera repentina. ASEGRESE DE QUE:   Comprende estas instrucciones.  Controlar su enfermedad.  Solicitar ayuda de inmediato si no mejora o si empeora. Document Released: 10/26/2004 Document Revised: 04/10/2011 [facility] Patient Information 2014 [place], [place].  Gripe en el adulto  (Influenza, Adult)  La gripe es una infeccin viral del tracto respiratorio. Ocurre con ms frecuencia en los meses de invierno, ya que las personas pasan ms tiempo en contacto cercano. La gripe puede enfermarlo considerablemente. Se transmite de [place] persona a otra (es contagiosa). CAUSAS  La causa es un virus que infecta el tracto respiratorio. Puede contagiarse el virus al aspirar las gotitas que una persona infectada elimina al toser o Brewing technologist. Tambin puede contagiarse al tocar algo que fue recientemente contaminado con el virus y [employer] mano a la boca, la nariz o los ojos.  SNTOMAS  Los sntomas pueden durar entre 4 y 39 das y pueden ser:   Cristy Hilts.  Escalofros.  Dolor de [place], dolores en el cuerpo y musculares.  Dolor de Investment banker, operational.  Molestias en el pecho y tos.  Prdida del apetito.  Debilidad o cansancio.  Mareos.  Nuseas o vmitos. DIAGNSTICO  El diagnstico se realiza segn su historia clnica y el examen fsico. Es necesario realizar un anlisis de secreciones de la nariz y la garganta para confirmar el diagnstico.  RIESGOS Y COMPLICACIONES  Tendr mayor riesgo de sufrir un resfro grave si consume cigarrillos, es diabtico, sufre una enfermedad cardaca (como insuficiencia cardaca) o pulmonar crnica (como asma) o si tiene debilitado el sistema inmunolgico. Los ancianos y las mujeres embarazadas tienen  ms riesgo de sufrir infecciones graves. La complicacin ms frecuente de la gripe es la infeccin pulmonar (pneumonia). En algunos casos esta complicacin puede requerir asistencia mdica de emergencia y puede poner en peligro su vida.  PREVENCIN  La vacunacin anual contra la gripe es la mejor manera de evitar enfermarse. Se recomienda ahora de manera rutinaria una vacuna anual contra la gripe a todos los [employer].  TRATAMIENTO  En los casos leves, la gripe se cura sin tratamiento. El tratamiento est dirigido a Herbalist sntomas. En los casos ms graves, el mdico podr recetar medicamentos antivirales para acortar el curso de la enfermedad. Los antibiticos no son eficaces, ya que esta infeccin la causa un virus y no una bacteria.  INSTRUCCIONES PARA EL CUIDADO EN EL HOGAR   Slo tome medicamentos de venta libre o recetados para Conservation officer, historic buildings, Tree surgeon o fiebre, segn las indicaciones del mdico.  Utilice un humidificador de niebla fra para Museum/gallery exhibitions officer respiracin.  Haga reposo hasta que la temperatura sea normal: Generalmente esto lleva entre 3 y 4 das.  Debe ingerir gran cantidad de lquido para mantener la orina de tono claro o color amarillo plido.  Reunion su boca y su nariz al toser o Brewing technologist, y Autoliv las manos muy bien para evitar que se propague el virus.  Foy [place]  en su casa y no concurra al [place] o a la escuela hasta que la fiebre haya desaparecido al menos por 1 da completo. SOLICITE ATENCIN MDICA SI:   Siente dolor en el pecho o una tos profunda que empeora o produce ms mucosidad.  Tiene nuseas, vmitos o diarrea. SOLICITE ATENCIN [place] DE INMEDIATO SI:   Tiene dificultad para respirar, le falta la respiracin o la piel o las uas se tornan azulados.  Presenta dolor intenso o rigidez en el cuello.  Comienza a sentir dolor de cabeza, en el rostro o en los odos.  Tiene fiebre recurrente o esta empeora.  Presenta nuseas o vmitos que no  puede controlar. ASEGRESE DE QUE:   Comprende estas instrucciones.  Controlar su enfermedad.  Solicitar ayuda de inmediato si no mejora o si empeora. Document Released: 10/26/2004 Document Revised: 07/18/2011 [facility] Patient Information 2014 [place], [place].   May use Delsym as directed for cough, drink a lot of fluids and get some rest. Motrin 800mg  every 8 hours for fever. If worsens please f/u.

## 2013-03-07 NOTE — ED Notes (Signed)
Reported fever , ST, body aches, congestion since yesterday; several others in home ill w similar symptoms

## 2013-03-07 NOTE — ED Provider Notes (Signed)
CSN: [id number]     Arrival date & time 03/07/13  1848 History   None    Chief Complaint  Patient presents with  . Fever   (Consider location/radiation/quality/duration/timing/severity/associated sxs/prior Treatment) HPI Comments: Patient's daughter reports an acute onset of high fevers (103) severe body aches, dry cough and fatigue that started abruptly last evening. No head congestion, rhinnorhea, abdominal pain or N, V.   Patient is a 63 y.o. female presenting with fever. The history is provided by a relative. The history is limited by a language barrier.  Fever   Past Medical History  Diagnosis Date  . Hypertension   . High cholesterol   . Internal hemorrhoids   . Diverticulosis   . Hiatal hernia   . PUD (peptic ulcer disease)   . GERD (gastroesophageal reflux disease)   . Chronic constipation   . Diverticulitis   . Fatty liver   . 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 Abdominal hysterectomy    . Colonoscopy  04/27/09     POLYPOID FRAGMENT OF COLONIC MUCOSA WITH SURFACE  . Tubal ligation     Family History  Problem Relation Age of Onset  . Colon cancer Neg Hx   . Diabetes Sister    History  Substance Use Topics  . Smoking status: Former Research scientist (life sciences)  . Smokeless tobacco: Never Used     Comment: "when she was young"  . Alcohol Use: No   OB History   Grav Para Term Preterm Abortions TAB SAB Ect Mult Living                 Review of Systems  Constitutional: Positive for fever.  All other systems reviewed and are negative.    Allergies  Ciprofloxacin and Flagyl  Home Medications   Current Outpatient Rx  Name  Route  Sig  Dispense  Refill  . aspirin EC 81 MG tablet   Oral   Take 81 mg by mouth daily.         Marland Kitchen losartan (COZAAR) 100 MG tablet   Oral   Take 100 mg by mouth daily.         .  pantoprazole (PROTONIX) 40 MG tablet   Oral   Take 40 mg by mouth daily.         . polyethylene glycol powder (MIRALAX) powder   Oral   Take 17 g by mouth daily.   255 g   11   . pravastatin (PRAVACHOL) 20 MG tablet   Oral   Take 20 mg by mouth daily.         Marland Kitchen amoxicillin-clavulanate (AUGMENTIN) 875-125 MG per tablet   Oral   Take 1 tablet by mouth 2 (two) times daily.   20 tablet   0   . hydrocortisone (ANUSOL-HC) 2.5 % rectal cream   Rectal   Place rectally 2 (two) times daily as needed. Use regularly for 1 week then as needed         . hydrocortisone (ANUSOL-HC) 25 MG suppository   Rectal   Place 1 suppository (25 mg total) rectally at bedtime. For 7 days then as needed   30 suppository   1   . irbesartan (AVAPRO) 150 MG tablet   Oral   Take 150 mg by mouth daily.          . naproxen (NAPROSYN) 500 MG tablet  Oral   Take 1 tablet (500 mg total) by mouth 2 (two) times daily with a meal.   60 tablet   0   . oseltamivir (TAMIFLU) 75 MG capsule   Oral   Take 1 capsule (75 mg total) by mouth every 12 (twelve) hours.   10 capsule   0    BP 148/97  Pulse 140  Temp(Src) 103 F (39.4 C) (Oral)  Resp 22  SpO2 98% Physical Exam  Nursing note and vitals reviewed. Constitutional: She is oriented to person, place, and time. She appears well-developed and well-nourished. No distress.  Appears fatigued and ill  HENT:  Head: Normocephalic and atraumatic.  Right Ear: External ear normal.  Left Ear: External ear normal.  Mouth/Throat: Oropharynx is clear and moist. No oropharyngeal exudate.  Eyes: Pupils are equal, round, and reactive to light.  Neck: Normal range of motion.  Cardiovascular: Normal rate, regular rhythm and normal heart sounds.   Pulmonary/Chest: Effort normal and breath sounds normal. No respiratory distress. She has no wheezes. She has no rales.  Lymphadenopathy:    She has no cervical adenopathy.  Neurological: She is alert and oriented  to person, place, and time. No cranial nerve deficit. Coordination normal.  Skin: Skin is warm and dry. She is not diaphoretic.  Psychiatric: Her behavior is normal.    ED Course  Procedures (including critical care time) Labs Review Labs Reviewed - No data to display Imaging Review No results found.    MDM   1. Influenza   2. Fever    Tamiflu window. Symptomatic care. F/U if worsens.     Bjorn Pippin, PA-C 03/07/13 2017

## 2013-03-07 NOTE — ED Provider Notes (Signed)
Medical screening examination/treatment/procedure(s) were performed by non-physician practitioner and as supervising physician I was immediately available for consultation/collaboration.  Philipp Deputy, M.D.   Harden Mo, MD 03/07/13 2108

## 2013-10-16 DIAGNOSIS — Z1231 Encounter for screening mammogram for malignant neoplasm of breast: Secondary | ICD-10-CM | POA: Diagnosis not present

## 2013-10-21 DIAGNOSIS — N6009 Solitary cyst of unspecified breast: Secondary | ICD-10-CM | POA: Diagnosis not present

## 2013-10-21 DIAGNOSIS — N63 Unspecified lump in unspecified breast: Secondary | ICD-10-CM | POA: Diagnosis not present

## 2014-01-19 DIAGNOSIS — H01025 Squamous blepharitis left lower eyelid: Secondary | ICD-10-CM | POA: Diagnosis not present

## 2014-01-19 DIAGNOSIS — H2513 Age-related nuclear cataract, bilateral: Secondary | ICD-10-CM | POA: Diagnosis not present

## 2014-01-19 DIAGNOSIS — H35341 Macular cyst, hole, or pseudohole, right eye: Secondary | ICD-10-CM | POA: Diagnosis not present

## 2014-01-19 DIAGNOSIS — H01021 Squamous blepharitis right upper eyelid: Secondary | ICD-10-CM | POA: Diagnosis not present

## 2014-01-19 DIAGNOSIS — H01022 Squamous blepharitis right lower eyelid: Secondary | ICD-10-CM | POA: Diagnosis not present

## 2014-01-19 DIAGNOSIS — H5711 Ocular pain, right eye: Secondary | ICD-10-CM | POA: Diagnosis not present

## 2014-01-19 DIAGNOSIS — H01024 Squamous blepharitis left upper eyelid: Secondary | ICD-10-CM | POA: Diagnosis not present

## 2014-01-21 ENCOUNTER — Encounter (HOSPITAL_COMMUNITY): Payer: Self-pay | Admitting: Emergency Medicine

## 2014-01-21 ENCOUNTER — Emergency Department (HOSPITAL_COMMUNITY): Payer: Medicare Other

## 2014-01-21 ENCOUNTER — Emergency Department (INDEPENDENT_AMBULATORY_CARE_PROVIDER_SITE_OTHER)
Admission: EM | Admit: 2014-01-21 | Discharge: 2014-01-21 | Disposition: A | Discharge status: Home / Self Care | Payer: Medicare Other | Source: Home / Self Care | Attending: Emergency Medicine | Admitting: Emergency Medicine

## 2014-01-21 ENCOUNTER — Emergency Department (HOSPITAL_COMMUNITY)
Admission: EM | Admit: 2014-01-21 | Discharge: 2014-01-21 | Disposition: A | Discharge status: Home / Self Care | Payer: Medicare Other | Attending: Emergency Medicine | Admitting: Emergency Medicine

## 2014-01-21 ENCOUNTER — Encounter (HOSPITAL_COMMUNITY): Payer: Self-pay | Admitting: *Deleted

## 2014-01-21 DIAGNOSIS — K219 Gastro-esophageal reflux disease without esophagitis: Secondary | ICD-10-CM | POA: Diagnosis not present

## 2014-01-21 DIAGNOSIS — K648 Other hemorrhoids: Secondary | ICD-10-CM | POA: Diagnosis not present

## 2014-01-21 DIAGNOSIS — K5900 Constipation, unspecified: Secondary | ICD-10-CM | POA: Diagnosis not present

## 2014-01-21 DIAGNOSIS — Z791 Long term (current) use of non-steroidal anti-inflammatories (NSAID): Secondary | ICD-10-CM | POA: Diagnosis not present

## 2014-01-21 DIAGNOSIS — Z8711 Personal history of peptic ulcer disease: Secondary | ICD-10-CM | POA: Diagnosis not present

## 2014-01-21 DIAGNOSIS — Z7982 Long term (current) use of aspirin: Secondary | ICD-10-CM | POA: Insufficient documentation

## 2014-01-21 DIAGNOSIS — R079 Chest pain, unspecified: Secondary | ICD-10-CM

## 2014-01-21 DIAGNOSIS — Z87891 Personal history of nicotine dependence: Secondary | ICD-10-CM | POA: Insufficient documentation

## 2014-01-21 DIAGNOSIS — Z79899 Other long term (current) drug therapy: Secondary | ICD-10-CM | POA: Diagnosis not present

## 2014-01-21 DIAGNOSIS — R0789 Other chest pain: Secondary | ICD-10-CM | POA: Insufficient documentation

## 2014-01-21 DIAGNOSIS — Z792 Long term (current) use of antibiotics: Secondary | ICD-10-CM | POA: Insufficient documentation

## 2014-01-21 DIAGNOSIS — I10 Essential (primary) hypertension: Secondary | ICD-10-CM | POA: Diagnosis not present

## 2014-01-21 DIAGNOSIS — E780 Pure hypercholesterolemia: Secondary | ICD-10-CM | POA: Diagnosis not present

## 2014-01-21 LAB — I-STAT TROPONIN, ED
Troponin i, poc: 0 ng/mL (ref 0.00–0.08)
Troponin i, poc: 0 ng/mL (ref 0.00–0.08)

## 2014-01-21 LAB — BASIC METABOLIC PANEL
Anion gap: 6 (ref 5–15)
BUN: 9 mg/dL (ref 6–23)
CO2: 30 mmol/L (ref 19–32)
CREATININE: 0.94 mg/dL (ref 0.50–1.10)
Calcium: 9.2 mg/dL (ref 8.4–10.5)
Chloride: 102 mEq/L (ref 96–112)
GFR calc non Af Amer: 64 mL/min — ABNORMAL LOW (ref >90)
GFR, EST AFRICAN AMERICAN: 74 mL/min — AB (ref >90)
Glucose, Bld: 121 mg/dL — ABNORMAL HIGH (ref 70–99)
Potassium: 4.4 mmol/L (ref 3.5–5.1)
Sodium: 138 mmol/L (ref 135–145)

## 2014-01-21 LAB — CBC
HCT: 37.7 % (ref 36.0–46.0)
Hemoglobin: 12.2 g/dL (ref 12.0–15.0)
MCH: 28.1 pg (ref 26.0–34.0)
MCHC: 32.4 g/dL (ref 30.0–36.0)
MCV: 86.9 fL (ref 78.0–100.0)
Platelets: 231 10*3/uL (ref 150–400)
RBC: 4.34 MIL/uL (ref 3.87–5.11)
RDW: 13 % (ref 11.5–15.5)
WBC: 6.1 10*3/uL (ref 4.0–10.5)

## 2014-01-21 MED ORDER — ASPIRIN 81 MG PO CHEW: 324.0000 mg | CHEWABLE_TABLET | Freq: Once | ORAL | Status: DC

## 2014-01-21 MED ORDER — MORPHINE SULFATE 4 MG/ML IJ SOLN
4.0000 mg | Freq: Once | INTRAMUSCULAR | Status: AC
  Administered 2014-01-21: 4 mg via INTRAVENOUS
  Filled 2014-01-21: qty 1

## 2014-01-21 MED ORDER — IBUPROFEN 800 MG PO TABS: 800.0000 mg | ORAL_TABLET | Freq: Three times a day (TID) | ORAL | Status: DC | PRN

## 2014-01-21 MED ORDER — HYDROCODONE-ACETAMINOPHEN 5-325 MG PO TABS: 1.0000 | ORAL_TABLET | Freq: Four times a day (QID) | ORAL | Status: DC | PRN

## 2014-01-21 MED ORDER — ASPIRIN 81 MG PO CHEW
CHEWABLE_TABLET | ORAL | Status: AC
  Filled 2014-01-21: qty 3

## 2014-01-21 MED ORDER — ASPIRIN 81 MG PO CHEW
243.0000 mg | CHEWABLE_TABLET | Freq: Once | ORAL | Status: AC
  Administered 2014-01-21: 243 mg via ORAL

## 2014-01-21 NOTE — Discharge Instructions (Signed)
Return here as needed.  Follow-up with the cardiologist provided as needed.  Your testing here today was normal

## 2014-01-21 NOTE — ED Notes (Signed)
See Suzanne's Y, note

## 2014-01-21 NOTE — ED Provider Notes (Signed)
CSN: [id number]     Arrival date & time 01/21/14  1548 History   First MD Initiated Contact with Patient 01/21/14 1618     Chief Complaint  Patient presents with  . Chest Pain   (Consider location/radiation/quality/duration/timing/severity/associated sxs/prior Treatment) HPI Comments: PCP: Dr. Jimmye Norman No reported personal or family history of heart disease  Patient is a 63 y.o. female presenting with chest pain. The history is provided by the patient and a relative. The history is limited by a language barrier. A language interpreter was used.  Chest Pain Pain location:  Substernal area Pain quality: sharp   Pain radiates to:  R jaw, R shoulder, R arm and mid back Pain radiates to the back: yes   Pain severity:  Moderate Onset quality:  Gradual Duration:  24 hours Timing:  Intermittent Progression:  Waxing and waning Chronicity:  New Relieved by: cannot identify alleviating factors. Exacerbated by: +lying supine. Associated symptoms: back pain and shortness of breath   Associated symptoms: no abdominal pain, no anorexia, no anxiety, no claudication, no cough, no diaphoresis, no dizziness, no dysphagia, no fatigue, no fever, no headache, no heartburn, no lower extremity edema, no nausea, no near-syncope, no numbness, no orthopnea, no palpitations, no PND, no syncope, not vomiting and no weakness   Risk factors: high cholesterol and hypertension   Risk factors: no smoking     Past Medical History  Diagnosis Date  . Hypertension   . High cholesterol   . Internal hemorrhoids   . Diverticulosis   . Hiatal hernia   . PUD (peptic ulcer disease)   . GERD (gastroesophageal reflux disease)   . Chronic constipation   . Diverticulitis   . Fatty liver   . 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 Abdominal hysterectomy    .  Colonoscopy  04/27/09     POLYPOID FRAGMENT OF COLONIC MUCOSA WITH SURFACE  . Tubal ligation     Family History  Problem Relation Age of Onset  . Colon cancer Neg Hx   . Diabetes Sister    History  Substance Use Topics  . Smoking status: Former Research scientist (life sciences)  . Smokeless tobacco: Never Used     Comment: "when she was young"  . Alcohol Use: No   OB History    No data available     Review of Systems  Constitutional: Negative for fever, diaphoresis and fatigue.  HENT: Negative.  Negative for trouble swallowing.   Eyes: Negative.   Respiratory: Positive for shortness of breath. Negative for cough.   Cardiovascular: Positive for chest pain. Negative for palpitations, orthopnea, claudication, leg swelling, syncope, PND and near-syncope.  Gastrointestinal: Negative for heartburn, nausea, vomiting, abdominal pain and anorexia.  Genitourinary: Negative.   Musculoskeletal: Positive for back pain.  Neurological: Negative for dizziness, weakness, numbness and headaches.    Allergies  Ciprofloxacin and Flagyl  Home Medications   Prior to Admission medications   Medication Sig Start Date End Date Taking? Authorizing Provider  aspirin EC 81 MG tablet Take 81 mg by mouth daily.   Yes Historical Provider, MD  irbesartan (AVAPRO) 150 MG tablet Take 150 mg by mouth daily.    Yes Historical Provider, MD  losartan (COZAAR) 100 MG tablet Take 100 mg by mouth daily.   Yes Historical Provider, MD  pantoprazole (PROTONIX) 40 MG tablet Take 40 mg by mouth daily.   Yes Historical  Provider, MD  amoxicillin-clavulanate (AUGMENTIN) 875-125 MG per tablet Take 1 tablet by mouth 2 (two) times daily. 01/03/13   Amy S Esterwood, PA-C  hydrocortisone (ANUSOL-HC) 2.5 % rectal cream Place rectally 2 (two) times daily as needed. Use regularly for 1 week then as needed 02/01/12   [name], MD  hydrocortisone (ANUSOL-HC) 25 MG suppository Place 1 suppository (25 mg total) rectally at bedtime. For 7 days then as  needed 01/03/13   Amy S Esterwood, PA-C  naproxen (NAPROSYN) 500 MG tablet Take 1 tablet (500 mg total) by mouth 2 (two) times daily with a meal. 05/10/12   [name], MD  oseltamivir (TAMIFLU) 75 MG capsule Take 1 capsule (75 mg total) by mouth every 12 (twelve) hours. 03/07/13   Bjorn Pippin, PA-C  polyethylene glycol powder (MIRALAX) powder Take 17 g by mouth daily. 05/10/12   [name], MD  pravastatin (PRAVACHOL) 20 MG tablet Take 20 mg by mouth daily.    Historical Provider, MD   BP 102/59 mmHg  Pulse 77  Temp(Src) 98.3 F (36.8 C) (Oral)  Resp 20  SpO2 98% Physical Exam  Constitutional: She is oriented to person, place, and time. She appears well-developed and well-nourished. No distress.  HENT:  Head: Normocephalic and atraumatic.  Eyes: Conjunctivae are normal. No scleral icterus.  Cardiovascular: Normal rate, regular rhythm and normal heart sounds.  Exam reveals no gallop and no friction rub.   No murmur heard. Pulmonary/Chest: Effort normal and breath sounds normal. No respiratory distress. She has no wheezes.  Abdominal: Soft. Bowel sounds are normal. She exhibits no distension. There is no tenderness.  Musculoskeletal: Normal range of motion. She exhibits no edema.  Neurological: She is alert and oriented to person, place, and time.  Skin: Skin is warm and dry. No rash noted. No erythema.  Psychiatric: She has a normal mood and affect. Her behavior is normal.  Nursing note and vitals reviewed.   ED Course  Procedures (including critical care time) Labs Review Labs Reviewed - No data to display  Imaging Review No results found.   MDM   1. Chest pain, unspecified chest pain type   Patient reports only mild substernal discomfort at time of exam, however, SL NTG not given as BP 102/59. IV NS placed, given aspirin to chew and swallow, patient placed on cardiac monitor, placed on O2 and EMS transfer to [facility] ER arranged. ECG: NSR at 73 bpm with no ectopy  or acute ST/T wave changes. Normal Intervals. No change other than rate when compared to ECG from 10/2011. No evidence of STEMI.     Lutricia Feil, [place] 01/21/14 [phone]

## 2014-01-21 NOTE — ED Notes (Signed)
Per [facility] pt was at urgent care being seen for chest pain that started yesterday. Pt stated that it feels like epigastric pain that radiates to her chest, back, and right side of her neck.  The pain gets worst when she moves.  Urgent care thought it would be best to bring her to the ED for further evaluation.  Pt took 1 81mg  aspirin at home, and was given 3 more to total 324mg  at the urgent care. She was also given 1 nitro.  VS are as follows: B/P:101/60 HR:85, R:18, O2 98% on RA

## 2014-01-21 NOTE — ED Notes (Signed)
Reports she had her 81 mg of Asprin this am

## 2014-01-21 NOTE — ED Provider Notes (Signed)
CSN: [id number]     Arrival date & time 01/21/14  1730 History   First MD Initiated Contact with Patient 01/21/14 [place]     Chief Complaint  Patient presents with  . Chest Pain     (Consider location/radiation/quality/duration/timing/severity/associated sxs/prior Treatment) HPI Patient presents to the emergency department with chest pain that started yesterday.  The patient states that movement and palpation make the pain worse.  The patient states that the pain has been constant since the onset.  The patient denies shortness breath, nausea, vomiting, abdominal pain, fever, cough, rhinorrhea, sore throat, back pain, dysuria, rash, near syncope, lightheadedness or syncope.  The patient, states she has also had some right-sided neck pain seems to go into her right side of her chest.  The pain is worse with movement and palpation.  Patient states she did not take any medications prior to arrival for her symptoms Past Medical History  Diagnosis Date  . Hypertension   . High cholesterol   . Internal hemorrhoids   . Diverticulosis   . Hiatal hernia   . PUD (peptic ulcer disease)   . GERD (gastroesophageal reflux disease)   . Chronic constipation   . Diverticulitis   . Fatty liver   . 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 Abdominal hysterectomy    . Colonoscopy  04/27/09     POLYPOID FRAGMENT OF COLONIC MUCOSA WITH SURFACE  . Tubal ligation     Family History  Problem Relation Age of Onset  . Colon cancer Neg Hx   . Diabetes Sister    History  Substance Use Topics  . Smoking status: Former Research scientist (life sciences)  . Smokeless tobacco: Never Used     Comment: "when she was young"  . Alcohol Use: No   OB History    No data available     Review of Systems   All other systems negative except as documented in the HPI. All pertinent positives  and negatives as reviewed in the HPI.  Allergies  Ciprofloxacin and Flagyl  Home Medications   Prior to Admission medications   Medication Sig Start Date End Date Taking? Authorizing Provider  amoxicillin-clavulanate (AUGMENTIN) 875-125 MG per tablet Take 1 tablet by mouth 2 (two) times daily. 01/03/13   Amy S Esterwood, PA-C  aspirin EC 81 MG tablet Take 81 mg by mouth daily.    Historical Provider, MD  hydrocortisone (ANUSOL-HC) 2.5 % rectal cream Place rectally 2 (two) times daily as needed. Use regularly for 1 week then as needed 02/01/12   [name], MD  hydrocortisone (ANUSOL-HC) 25 MG suppository Place 1 suppository (25 mg total) rectally at bedtime. For 7 days then as needed 01/03/13   Amy S Esterwood, PA-C  irbesartan (AVAPRO) 150 MG tablet Take 150 mg by mouth daily.     Historical Provider, MD  losartan (COZAAR) 100 MG tablet Take 100 mg by mouth daily.    Historical Provider, MD  naproxen (NAPROSYN) 500 MG tablet Take 1 tablet (500 mg total) by mouth 2 (two) times daily with a meal. 05/10/12   [name], MD  oseltamivir (TAMIFLU) 75 MG capsule Take 1 capsule (75 mg total) by mouth every 12 (twelve) hours. 03/07/13   Bjorn Pippin, PA-C  pantoprazole (PROTONIX) 40 MG tablet Take 40 mg by mouth daily.    Historical Provider, MD  polyethylene glycol powder (MIRALAX) powder  Take 17 g by mouth daily. 05/10/12   [name], MD  pravastatin (PRAVACHOL) 20 MG tablet Take 20 mg by mouth daily.    Historical Provider, MD   BP 111/76 mmHg  Pulse 65  Resp 17  SpO2 100% Physical Exam  Constitutional: She is oriented to person, place, and time. She appears well-developed and well-nourished.  HENT:  Head: Normocephalic and atraumatic.  Mouth/Throat: Oropharynx is clear and moist.  Eyes: Pupils are equal, round, and reactive to light.  Neck: Normal range of motion. Neck supple. No tracheal tenderness present. No rigidity. No edema and no erythema present.    Cardiovascular:  Normal rate, regular rhythm and normal heart sounds.  Exam reveals no gallop and no friction rub.   No murmur heard. Pulmonary/Chest: Effort normal and breath sounds normal. No stridor. No respiratory distress. She exhibits tenderness.  Abdominal: Soft. Bowel sounds are normal. She exhibits no distension. There is no tenderness.  Musculoskeletal: Normal range of motion. She exhibits no edema.  Neurological: She is alert and oriented to person, place, and time. She exhibits normal muscle tone. Coordination normal.  Skin: Skin is warm and dry. No rash noted. No erythema.  Nursing note and vitals reviewed.   ED Course  Procedures (including critical care time) Labs Review Labs Reviewed  BASIC METABOLIC PANEL - Abnormal; Notable for the following:    Glucose, Bld 121 (*)    GFR calc non Af Amer 64 (*)    GFR calc Af Amer 74 (*)    All other components within normal limits  CBC  I-STAT TROPOININ, ED  Randolm Idol, ED    Imaging Review Dg Chest Port 1 View  01/21/2014   CLINICAL DATA:  Chest pain radiating to back and RIGHT neck.  EXAM: PORTABLE CHEST - 1 VIEW  COMPARISON:  None.  FINDINGS: Cardiomediastinal silhouette is unremarkable for this low inspiratory portable examination with crowded vasculature markings. The lungs are clear without pleural effusions or focal consolidations. Trachea projects midline and there is no pneumothorax. Included soft tissue planes and osseous structures are non-suspicious. Mild degenerative change of the LEFT shoulder.  IMPRESSION: No acute cardiopulmonary process for this low inspiratory examination.   Electronically Signed   By: Elon Alas   On: 01/21/2014 19:28     EKG Interpretation   Date/Time:  Wednesday January 21 2014 17:39:05 EST Ventricular Rate:  84 PR Interval:  156 QRS Duration: 102 QT Interval:  400 QTC Calculation: 473 R Axis:   -14 Text Interpretation:  Sinus rhythm Low voltage, precordial leads since  last tracing no  significant change Confirmed by Eulis Foster  MD, Vira Agar ([id number])  on 01/21/2014 8:24:35 PM      MDM   Final diagnoses:  None   patient has had constant pain for 24 hours and has 2 sets of negative enzymes with a normal EKG the patient is feeling better at this time.  This seems atypical for cardiac chest pain.  She is advised to follow-up with her primary care doctor      Brent General, PA-C 01/22/14 0105  Richarda Blade, MD 01/22/14 1158

## 2014-01-21 NOTE — ED Notes (Signed)
C/o mid chest pain that radiates through to her back onset yesterday. Pain comes and goes. Has had 4 episodes since yesterday. Also c/o pain in R side of her neck x 1 week.  States she feels this pain when she turns her head.  C/o SOB when she has the sharp chest pain.  No nausea or sweating.  Hx. HTN and GERD and has been taking her medicines for this.  Pt. moved to room 5 by W/C.  EMT notified to do EKG.

## 2014-02-13 ENCOUNTER — Ambulatory Visit (INDEPENDENT_AMBULATORY_CARE_PROVIDER_SITE_OTHER): Payer: Medicare Other | Admitting: Internal Medicine

## 2014-02-13 ENCOUNTER — Encounter: Payer: Self-pay | Admitting: Internal Medicine

## 2014-02-13 VITALS — BP 118/86 | HR 84 | Ht 60.0 in | Wt 193.0 lb

## 2014-02-13 DIAGNOSIS — R0789 Other chest pain: Secondary | ICD-10-CM | POA: Diagnosis not present

## 2014-02-13 DIAGNOSIS — K219 Gastro-esophageal reflux disease without esophagitis: Secondary | ICD-10-CM | POA: Diagnosis not present

## 2014-02-13 DIAGNOSIS — M542 Cervicalgia: Secondary | ICD-10-CM

## 2014-02-13 MED ORDER — SUCRALFATE 1 G PO TABS: 1.0000 g | ORAL_TABLET | Freq: Three times a day (TID) | ORAL | Status: DC

## 2014-02-13 MED ORDER — METHOCARBAMOL 500 MG PO TABS: 500.0000 mg | ORAL_TABLET | Freq: Four times a day (QID) | ORAL | Status: DC | PRN

## 2014-02-13 MED ORDER — MELOXICAM 15 MG PO TABS: 15.0000 mg | ORAL_TABLET | Freq: Every day | ORAL | Status: DC

## 2014-02-13 NOTE — Progress Notes (Signed)
   Subjective:    Patient ID: Julie Dougherty, female    DOB: 07/09/1950, 64 y.o.   MRN: [id number]  HPI The patient is here because of diffuse chest pain, increasing heartburn, and epigastric pain. For a couple of months she's had these problems. She was in the emergency room in urgent care area where she was ruled out for MI. She has right neck pain when she turns her head to the right, she cannot get comfortable in bed, she is sore all over the thorax and upper back. She does not remember any injury. Hurts to breathe sometimes. Chest x-ray has been okay. There is no real cough. She is not having any dysphagia. She is wondering about whether she should have another upper endoscopy because she is having heartburn problems.  She remains on her pantoprazole. She is not taking any anti-inflammatories or any medication specific to treat this problem.  Interpreter is present and age with a history as the patient only speaks Spanish Medications, allergies, past medical history, past surgical history, family history and social history are reviewed and updated in the EMR.   Review of Systems As above    Objective:   Physical Exam Well-developed well-nourished overweight to obese Hispanic woman in no acute distress There is tightness and spasm in the right trapezius it is tender. She has pain in that area and her neck when she turns to the right but not to the left. There is fairly good range of motion in the neck. Chest wall is diffusely tender including the xiphoid. Right more than the left but both including the sternum. This is mild to moderate at best. The spine is not tender. The lungs are clear Heart sounds normal The abdomen is soft and nontender except for the epigastric area where it is tender to some degree. I have reviewed the ER notes x-ray reports and labs from 01/21/2014    Assessment & Plan:   1. Chest wall pain   2. Neck pain   3. Gastroesophageal reflux disease,  esophagitis presence not specified    1. I'm going to treat her with Bobek 15 mg daily, Robaxin 500 mg when necessary at bedtime, and add Carafate 1 g 3 times a day to her medication regimen. 2. I've explained neck pain Exercises and try to do those in the shower 3. I think she should try using a neck pillow to sleep with 4. I will see her back in 2 months sooner if needed but I think this is most likely musculoskeletal and not much of a reflux problem. We might need to change PPI depending upon how she responds to the Carafate

## 2014-02-13 NOTE — Patient Instructions (Signed)
We have sent the following medications to your pharmacy for you to pick up at your convenience: Robaxin and Mobic  Follow up with Dr Carlean Purl in 2 months.  I appreciate the opportunity to care for you. Silvano Rusk, M.D., [facility]

## 2014-03-20 DIAGNOSIS — M810 Age-related osteoporosis without current pathological fracture: Secondary | ICD-10-CM | POA: Diagnosis not present

## 2014-04-03 DIAGNOSIS — H01025 Squamous blepharitis left lower eyelid: Secondary | ICD-10-CM | POA: Diagnosis not present

## 2014-04-03 DIAGNOSIS — H0014 Chalazion left upper eyelid: Secondary | ICD-10-CM | POA: Diagnosis not present

## 2014-04-03 DIAGNOSIS — H5711 Ocular pain, right eye: Secondary | ICD-10-CM | POA: Diagnosis not present

## 2014-04-03 DIAGNOSIS — H01024 Squamous blepharitis left upper eyelid: Secondary | ICD-10-CM | POA: Diagnosis not present

## 2014-04-03 DIAGNOSIS — H01022 Squamous blepharitis right lower eyelid: Secondary | ICD-10-CM | POA: Diagnosis not present

## 2014-04-03 DIAGNOSIS — H01021 Squamous blepharitis right upper eyelid: Secondary | ICD-10-CM | POA: Diagnosis not present

## 2014-04-03 DIAGNOSIS — H2513 Age-related nuclear cataract, bilateral: Secondary | ICD-10-CM | POA: Diagnosis not present

## 2014-04-17 DIAGNOSIS — R5382 Chronic fatigue, unspecified: Secondary | ICD-10-CM | POA: Diagnosis not present

## 2014-04-17 DIAGNOSIS — I10 Essential (primary) hypertension: Secondary | ICD-10-CM | POA: Diagnosis not present

## 2014-04-17 DIAGNOSIS — E785 Hyperlipidemia, unspecified: Secondary | ICD-10-CM | POA: Diagnosis not present

## 2014-04-17 DIAGNOSIS — E669 Obesity, unspecified: Secondary | ICD-10-CM | POA: Diagnosis not present

## 2014-05-07 DIAGNOSIS — I10 Essential (primary) hypertension: Secondary | ICD-10-CM | POA: Diagnosis not present

## 2014-05-07 DIAGNOSIS — R5382 Chronic fatigue, unspecified: Secondary | ICD-10-CM | POA: Diagnosis not present

## 2014-05-07 DIAGNOSIS — E785 Hyperlipidemia, unspecified: Secondary | ICD-10-CM | POA: Diagnosis not present

## 2014-05-07 DIAGNOSIS — E669 Obesity, unspecified: Secondary | ICD-10-CM | POA: Diagnosis not present

## 2014-07-31 DIAGNOSIS — H01024 Squamous blepharitis left upper eyelid: Secondary | ICD-10-CM | POA: Diagnosis not present

## 2014-07-31 DIAGNOSIS — H01021 Squamous blepharitis right upper eyelid: Secondary | ICD-10-CM | POA: Diagnosis not present

## 2014-07-31 DIAGNOSIS — H5711 Ocular pain, right eye: Secondary | ICD-10-CM | POA: Diagnosis not present

## 2014-07-31 DIAGNOSIS — H2513 Age-related nuclear cataract, bilateral: Secondary | ICD-10-CM | POA: Diagnosis not present

## 2014-07-31 DIAGNOSIS — H35341 Macular cyst, hole, or pseudohole, right eye: Secondary | ICD-10-CM | POA: Diagnosis not present

## 2014-07-31 DIAGNOSIS — H01022 Squamous blepharitis right lower eyelid: Secondary | ICD-10-CM | POA: Diagnosis not present

## 2014-07-31 DIAGNOSIS — H10413 Chronic giant papillary conjunctivitis, bilateral: Secondary | ICD-10-CM | POA: Diagnosis not present

## 2014-07-31 DIAGNOSIS — H01025 Squamous blepharitis left lower eyelid: Secondary | ICD-10-CM | POA: Diagnosis not present

## 2014-07-31 DIAGNOSIS — H0014 Chalazion left upper eyelid: Secondary | ICD-10-CM | POA: Diagnosis not present

## 2014-09-04 ENCOUNTER — Encounter: Payer: Self-pay | Admitting: Internal Medicine

## 2014-12-03 DIAGNOSIS — E785 Hyperlipidemia, unspecified: Secondary | ICD-10-CM | POA: Diagnosis not present

## 2014-12-03 DIAGNOSIS — R05 Cough: Secondary | ICD-10-CM | POA: Diagnosis not present

## 2014-12-03 DIAGNOSIS — J019 Acute sinusitis, unspecified: Secondary | ICD-10-CM | POA: Diagnosis not present

## 2014-12-03 DIAGNOSIS — I10 Essential (primary) hypertension: Secondary | ICD-10-CM | POA: Diagnosis not present

## 2014-12-03 DIAGNOSIS — Z1389 Encounter for screening for other disorder: Secondary | ICD-10-CM | POA: Diagnosis not present

## 2014-12-03 DIAGNOSIS — K640 First degree hemorrhoids: Secondary | ICD-10-CM | POA: Diagnosis not present

## 2014-12-07 DIAGNOSIS — N6002 Solitary cyst of left breast: Secondary | ICD-10-CM | POA: Diagnosis not present

## 2015-10-21 ENCOUNTER — Ambulatory Visit (INDEPENDENT_AMBULATORY_CARE_PROVIDER_SITE_OTHER): Payer: Medicare Other | Admitting: Physician Assistant

## 2015-10-21 ENCOUNTER — Ambulatory Visit (HOSPITAL_BASED_OUTPATIENT_CLINIC_OR_DEPARTMENT_OTHER)
Admission: RE | Admit: 2015-10-21 | Discharge: 2015-10-21 | Disposition: A | Discharge status: Home / Self Care | Payer: Medicare Other | Source: Ambulatory Visit | Attending: Physician Assistant | Admitting: Physician Assistant

## 2015-10-21 VITALS — BP 122/80 | HR 93 | Temp 97.9°F | Resp 17 | Ht 60.0 in | Wt 200.0 lb

## 2015-10-21 DIAGNOSIS — M7989 Other specified soft tissue disorders: Secondary | ICD-10-CM | POA: Diagnosis not present

## 2015-10-21 DIAGNOSIS — M79604 Pain in right leg: Secondary | ICD-10-CM | POA: Insufficient documentation

## 2015-10-21 NOTE — Progress Notes (Signed)
10/21/2015 6:04 PM   DOB: 04-Mar-1950 / MRN: [id number]  SUBJECTIVE:  Julie Dougherty is a 65 y.o. female presenting for right sided flank pain that started about 2 months ago and is worsening. She associates difficulty breathing only with "deep breathing."  She is moving her bowels, however she needs to take miralax.  Reports this pain has never been evaluated.  She has simply been living with the pain however it has become worse since the right sided foot swelling started (see below).  She also here for right sided leg swelling that started about two weeks ago.  The leg does hurt when she walks.  The pain is located in the midfoot and goes all the way up the back of her leg.  She denies a history of DVT/PE.  Does not take estrogen and is a non smoker.     She is allergic to ciprofloxacin and flagyl [metronidazole].   She  has a past medical history of Chronic constipation; Diverticulitis; Diverticulosis; Fatty liver; GERD (gastroesophageal reflux disease); Hiatal hernia; High cholesterol; Hypertension; Internal hemorrhoids; PUD (peptic ulcer disease); and Rectal ulceration (04/27/2009).    She  reports that she has quit smoking. She has never used smokeless tobacco. She reports that she does not drink alcohol or use drugs. She  has no sexual activity history on file. The patient  has a past surgical history that includes Abdominal hysterectomy; Colonoscopy (04/27/09); and Tubal ligation.  Her family history includes Diabetes in her sister.  Review of Systems  Constitutional: Negative for fever.  Respiratory: Negative for hemoptysis.   Cardiovascular: Negative for chest pain.  Gastrointestinal: Positive for constipation. Negative for abdominal pain and diarrhea.  Genitourinary: Positive for flank pain. Negative for dysuria, frequency, hematuria and urgency.  Skin: Negative for rash.  Neurological: Negative for dizziness.    The problem list and medications were reviewed and  updated by myself where necessary and exist elsewhere in the encounter.   OBJECTIVE:  BP 122/80 (BP Location: Left Arm, Patient Position: Sitting, Cuff Size: Large)   Pulse 93   Temp 97.9 F (36.6 C) (Oral)   Resp 17   Ht 5' (1.524 m)   Wt 200 lb (90.7 kg)   SpO2 96%   BMI 39.06 kg/m   Physical Exam  Constitutional: She is oriented to person, place, and time. She appears well-developed and well-nourished.  Cardiovascular: Normal rate and regular rhythm.   Pulmonary/Chest: Effort normal and breath sounds normal.  Musculoskeletal: Normal range of motion. She exhibits edema (right sided) and tenderness (mild tender about the posterior calf). She exhibits no deformity.  Neurological: She is alert and oriented to person, place, and time. She has normal reflexes.  Skin: Skin is warm and dry.    No results found for this or any previous visit (from the past 72 hour(s)).  No results found.  ASSESSMENT AND PLAN  Julie Dougherty was seen today for flank pain and leg swelling.  Diagnoses and all orders for this visit:  Right leg swelling: She has multiple complaints today and I do worry that her pleuritic pain could be secondary to a blood clot in her right leg. While this seems unlikely it is far from impossible.  Will scan her tonight and if positive will advise she go to the ED given her pleuritic pain. If she is negative for DVT will plan to see her back to create a more full picture of her symptoms.   -  US Venous Img Lower Unilateral Right; Future    The patient is advised to call or return to clinic if she does not see an improvement in symptoms, or to seek the care of the closest emergency department if she worsens with the above plan.   Philis Fendt, MHS, PA-C [facility] 10/21/2015 6:04 PM

## 2015-10-21 NOTE — Patient Instructions (Addendum)
  Your appointment is at 630pm for your doppler at [facility]. Address: [address], [place], [place] [address].  [phone]    IF you received an x-ray today, you will receive an invoice from [facility]. Please contact [facility] at [phone] with questions or concerns regarding your invoice.   IF you received labwork today, you will receive an invoice from [employer]. Please contact [facility] at [phone] with questions or concerns regarding your invoice.   Our billing staff will not be able to assist you with questions regarding bills from these companies.  You will be contacted with the lab results as soon as they are available. The fastest way to get your results is to activate your My Chart account. Instructions are located on the last page of this paperwork. If you have not heard from [place] regarding the results in 2 weeks, please contact this office.     We recommend that you schedule a mammogram for breast cancer screening. Typically, you do not need a referral to do this. Please contact a local imaging center to schedule your mammogram.  [facility] - [phone]  *ask for the Radiology Department [facility] - [phone] or [phone]  [facility] - [phone] [place] [phone] [facility] - [phone]  *ask for the [facility] - [phone]  *ask for the Radiology Department [facility] - [phone]  *ask for the [place] - [phone]

## 2015-10-22 ENCOUNTER — Ambulatory Visit (INDEPENDENT_AMBULATORY_CARE_PROVIDER_SITE_OTHER): Payer: Medicare Other

## 2015-10-22 ENCOUNTER — Ambulatory Visit (INDEPENDENT_AMBULATORY_CARE_PROVIDER_SITE_OTHER): Payer: Medicare Other | Admitting: Physician Assistant

## 2015-10-22 VITALS — BP 116/98 | HR 80 | Temp 98.2°F | Resp 16 | Ht 60.0 in | Wt 199.0 lb

## 2015-10-22 DIAGNOSIS — Z131 Encounter for screening for diabetes mellitus: Secondary | ICD-10-CM

## 2015-10-22 DIAGNOSIS — M79671 Pain in right foot: Secondary | ICD-10-CM

## 2015-10-22 DIAGNOSIS — R0602 Shortness of breath: Secondary | ICD-10-CM | POA: Diagnosis not present

## 2015-10-22 DIAGNOSIS — R0781 Pleurodynia: Secondary | ICD-10-CM

## 2015-10-22 DIAGNOSIS — Z8619 Personal history of other infectious and parasitic diseases: Secondary | ICD-10-CM

## 2015-10-22 DIAGNOSIS — M7989 Other specified soft tissue disorders: Secondary | ICD-10-CM | POA: Diagnosis not present

## 2015-10-22 DIAGNOSIS — R1010 Upper abdominal pain, unspecified: Secondary | ICD-10-CM | POA: Diagnosis not present

## 2015-10-22 DIAGNOSIS — R700 Elevated erythrocyte sedimentation rate: Secondary | ICD-10-CM

## 2015-10-22 DIAGNOSIS — K5900 Constipation, unspecified: Secondary | ICD-10-CM

## 2015-10-22 DIAGNOSIS — K5909 Other constipation: Secondary | ICD-10-CM

## 2015-10-22 DIAGNOSIS — R82998 Other abnormal findings in urine: Secondary | ICD-10-CM

## 2015-10-22 DIAGNOSIS — R109 Unspecified abdominal pain: Secondary | ICD-10-CM

## 2015-10-22 DIAGNOSIS — N390 Urinary tract infection, site not specified: Secondary | ICD-10-CM

## 2015-10-22 LAB — POCT SEDIMENTATION RATE: POCT SED RATE: 60 mm/h — AB (ref 0–22)

## 2015-10-22 LAB — POCT URINALYSIS DIP (MANUAL ENTRY)
Bilirubin, UA: NEGATIVE
Blood, UA: NEGATIVE
Glucose, UA: NEGATIVE
Ketones, POC UA: NEGATIVE
Nitrite, UA: NEGATIVE
PH UA: 6.5
PROTEIN UA: NEGATIVE
Spec Grav, UA: 1.025
UROBILINOGEN UA: 0.2

## 2015-10-22 LAB — COMPLETE METABOLIC PANEL WITH GFR
ALT: 13 U/L (ref 6–29)
AST: 16 U/L (ref 10–35)
Albumin: 4.2 g/dL (ref 3.6–5.1)
Alkaline Phosphatase: 63 U/L (ref 33–130)
BILIRUBIN TOTAL: 0.4 mg/dL (ref 0.2–1.2)
BUN: 15 mg/dL (ref 7–25)
CO2: 25 mmol/L (ref 20–31)
Calcium: 9.1 mg/dL (ref 8.6–10.4)
Chloride: 104 mmol/L (ref 98–110)
Creat: 1.04 mg/dL — ABNORMAL HIGH (ref 0.50–0.99)
GFR, Est African American: 66 mL/min (ref >=60)
GFR, Est Non African American: 57 mL/min — ABNORMAL LOW (ref >=60)
GLUCOSE: 98 mg/dL (ref 65–99)
Potassium: 4.3 mmol/L (ref 3.5–5.3)
Sodium: 138 mmol/L (ref 135–146)
TOTAL PROTEIN: 7.4 g/dL (ref 6.1–8.1)

## 2015-10-22 LAB — POCT CBC
Granulocyte percent: 54.5 %G (ref 37–80)
HCT, POC: 39.9 % (ref 37.7–47.9)
Hemoglobin: 13.7 g/dL (ref 12.2–16.2)
LYMPH, POC: 3 (ref 0.6–3.4)
MCH, POC: 29.7 pg (ref 27–31.2)
MCHC: 34.3 g/dL (ref 31.8–35.4)
MCV: 86.5 fL (ref 80–97)
MID (cbc): 0.5 (ref 0–0.9)
MPV: 8.1 fL (ref 0–99.8)
POC Granulocyte: 4.1 (ref 2–6.9)
POC LYMPH PERCENT: 39.3 %L (ref 10–50)
POC MID %: 6.2 % (ref 0–12)
Platelet Count, POC: 242 10*3/uL (ref 142–424)
RBC: 4.61 M/uL (ref 4.04–5.48)
RDW, POC: 13.6 %
WBC: 7.6 10*3/uL (ref 4.6–10.2)

## 2015-10-22 LAB — POC MICROSCOPIC URINALYSIS (UMFC): Mucus: ABSENT

## 2015-10-22 LAB — BRAIN NATRIURETIC PEPTIDE: Brain Natriuretic Peptide: 4.1 pg/mL (ref <100)

## 2015-10-22 LAB — POCT GLYCOSYLATED HEMOGLOBIN (HGB A1C): Hemoglobin A1C: 5.7

## 2015-10-22 LAB — URIC ACID: URIC ACID, SERUM: 6.5 mg/dL (ref 2.5–7.0)

## 2015-10-22 MED ORDER — CEPHALEXIN 500 MG PO CAPS: 500.0000 mg | ORAL_CAPSULE | Freq: Three times a day (TID) | ORAL | 0 refills | Status: DC

## 2015-10-22 MED ORDER — MELOXICAM 7.5 MG PO TABS: 7.5000 mg | ORAL_TABLET | Freq: Every day | ORAL | 0 refills | Status: DC

## 2015-10-22 MED ORDER — DOCUSATE SODIUM 100 MG PO CAPS: ORAL_CAPSULE | ORAL | 11 refills | Status: DC

## 2015-10-22 NOTE — Patient Instructions (Addendum)
     IF you received an x-ray today, you will receive an invoice from [facility]. Please contact [facility] at [phone] with questions or concerns regarding your invoice.   IF you received labwork today, you will receive an invoice from [facility]. Please contact [facility] at [phone] with questions or concerns regarding your invoice.   Our billing staff will not be able to assist you with questions regarding bills from these companies.  You will be contacted with the lab results as soon as they are available. The fastest way to get your results is to activate your My Chart account. Instructions are located on the last page of this paperwork. If you have not heard from us regarding the results in 2 weeks, please contact this office.      

## 2015-10-22 NOTE — Progress Notes (Signed)
10/22/2015 11:31 AM   DOB: 08/28/1950 / MRN: [id number]  SUBJECTIVE:  Julie Dougherty is an anicteric 65 y.o. female presenting for multiple complaints and problems.  I met her yesterday and her primary concern was leg swelling.  DVT was a plausible dx at that time and a scan of her right leg was negative.  She is here today for a follow up of her right sided flank pain that has been chronic in nature.  CHL reveals a history of Hep B, and last US of the abdomen showed coarsening of the liver in 2011.  Oddly enough hepatitis screenings do not readily exist in [facility].   She has a history of constipation and diverticulitis.  Her last colonoscopy was about 6 years ago per daughter.   She is allergic to ciprofloxacin and flagyl [metronidazole].   She  has a past medical history of Chronic constipation; Diverticulitis; Diverticulosis; Fatty liver; GERD (gastroesophageal reflux disease); Hiatal hernia; High cholesterol; Hypertension; Internal hemorrhoids; PUD (peptic ulcer disease); and Rectal ulceration (04/27/2009).    She  reports that she has quit smoking. She has never used smokeless tobacco. She reports that she does not drink alcohol or use drugs. She  has no sexual activity history on file. The patient  has a past surgical history that includes Abdominal hysterectomy; Colonoscopy (04/27/09); and Tubal ligation.  Her family history includes Diabetes in her sister.  Review of Systems  Constitutional: Negative for chills and fever.  Respiratory: Negative for cough and shortness of breath.   Cardiovascular: Positive for chest pain (right, lower, only with inspiration) and leg swelling (right greater than left, negative for DVT on 10/21/15). Negative for claudication and PND.  Gastrointestinal: Positive for blood in stool (chronic, history of hemorrhoids and diverticulosis) and constipation (chronic).  Genitourinary: Positive for flank pain. Negative for dysuria, frequency, hematuria and  urgency.  Skin: Negative for itching and rash.  Neurological: Negative for headaches.    The problem list and medications were reviewed and updated by myself where necessary and exist elsewhere in the encounter.   OBJECTIVE:  BP (!) 116/98   Pulse 80   Temp 98.2 F (36.8 C) (Oral)   Resp 16   Ht 5' (1.524 m)   Wt 199 lb (90.3 kg)   SpO2 98%   BMI 38.86 kg/m   Physical Exam  Constitutional: She is oriented to person, place, and time. She appears well-developed and well-nourished. No distress.  Cardiovascular: Normal rate, regular rhythm and normal heart sounds.   Pulmonary/Chest: Effort normal and breath sounds normal. She has no wheezes. She exhibits no tenderness.  Abdominal: Soft. Bowel sounds are normal. She exhibits no distension and no mass. There is tenderness (about the right flank and into the posterior lateral aspect of the liver. Negative Murphey's. ). There is no rebound and no guarding.  Musculoskeletal: Normal range of motion.       Right ankle: She exhibits swelling.  Neurological: She is alert and oriented to person, place, and time.  Skin: Skin is warm and dry. She is not diaphoretic.  Psychiatric: She has a normal mood and affect.    Results for orders placed or performed in visit on 10/22/15 (from the past 72 hour(s))  POCT CBC     Status: None   Collection Time: 10/22/15  9:36 AM  Result Value Ref Range   WBC 7.6 4.6 - 10.2 K/uL   Lymph, poc 3.0 0.6 - 3.4   POC LYMPH PERCENT 39.3  10 - 50 %L   MID (cbc) 0.5 0 - 0.9   POC MID % 6.2 0 - 12 %M   POC Granulocyte 4.1 2 - 6.9   Granulocyte percent 54.5 37 - 80 %G   RBC 4.61 4.04 - 5.48 M/uL   Hemoglobin 13.7 12.2 - 16.2 g/dL   HCT, POC 39.9 37.7 - 47.9 %   MCV 86.5 80 - 97 fL   MCH, POC 29.7 27 - 31.2 pg   MCHC 34.3 31.8 - 35.4 g/dL   RDW, POC 13.6 %   Platelet Count, POC 242 142 - 424 K/uL   MPV 8.1 0 - 99.8 fL  POCT glycosylated hemoglobin (Hb A1C)     Status: None   Collection Time: 10/22/15  9:42  AM  Result Value Ref Range   Hemoglobin A1C 5.7   POCT urinalysis dipstick     Status: Abnormal   Collection Time: 10/22/15 10:02 AM  Result Value Ref Range   Color, UA yellow yellow   Clarity, UA clear clear   Glucose, UA negative negative   Bilirubin, UA negative negative   Ketones, POC UA negative negative   Spec Grav, UA 1.025    Blood, UA negative negative   pH, UA 6.5    Protein Ur, POC negative negative   Urobilinogen, UA 0.2    Nitrite, UA Negative Negative   Leukocytes, UA small (1+) (A) Negative  POCT Microscopic Urinalysis ([facility])     Status: Abnormal   Collection Time: 10/22/15 10:03 AM  Result Value Ref Range   WBC,UR,HPF,POC Few (A) None WBC/hpf   RBC,UR,HPF,POC None None RBC/hpf   Bacteria Few (A) None, Too numerous to count   Mucus Absent Absent   Epithelial Cells, UR Per Microscopy None None, Too numerous to count cells/hpf  POCT SEDIMENTATION RATE     Status: Abnormal   Collection Time: 10/22/15 11:07 AM  Result Value Ref Range   POCT SED RATE 60 (A) 0 - 22 mm/hr    Dg Chest 2 View  Result Date: 10/22/2015 CLINICAL DATA:  Right-sided rib and flank pain for 3 months EXAM: CHEST  2 VIEW COMPARISON:  01/21/2014 FINDINGS: Normal heart size and mediastinal contours. No acute infiltrate or edema. No effusion or pneumothorax. Mid thoracic spondylosis. No acute osseous findings. IMPRESSION: No active disease or change compared to 2015. Electronically Signed   By: Monte Fantasia M.D.   On: 10/22/2015 09:27   US Venous Img Lower Unilateral Right  Result Date: 10/21/2015 CLINICAL DATA:  Subacute onset of right lower extremity swelling and pain. Initial encounter. EXAM: RIGHT LOWER EXTREMITY VENOUS DOPPLER ULTRASOUND TECHNIQUE: Gray-scale sonography with graded compression, as well as color Doppler and duplex ultrasound were performed to evaluate the lower extremity deep venous systems from the level of the common femoral vein and including the common femoral, femoral,  profunda femoral, popliteal and calf veins including the posterior tibial, peroneal and gastrocnemius veins when visible. The superficial great saphenous vein was also interrogated. Spectral Doppler was utilized to evaluate flow at rest and with distal augmentation maneuvers in the common femoral, femoral and popliteal veins. COMPARISON:  Right knee radiographs from 08/06/2008 FINDINGS: Contralateral Common Femoral Vein: Respiratory phasicity is normal and symmetric with the symptomatic side. No evidence of thrombus. Normal compressibility. Common Femoral Vein: No evidence of thrombus. Normal compressibility, respiratory phasicity and response to augmentation. Saphenofemoral Junction: No evidence of thrombus. Normal compressibility and flow on color Doppler imaging. Profunda Femoral Vein: No evidence of thrombus.  Normal compressibility and flow on color Doppler imaging. Femoral Vein: No evidence of thrombus. Normal compressibility, respiratory phasicity and response to augmentation. Popliteal Vein: No evidence of thrombus. Normal compressibility, respiratory phasicity and response to augmentation. Calf Veins: No evidence of thrombus. Normal compressibility and flow on color Doppler imaging. The peroneal vein is not visualized. Superficial Great Saphenous Vein: No evidence of thrombus. Normal compressibility and flow on color Doppler imaging. Venous Reflux:  None. Other Findings:  None. IMPRESSION: No evidence of deep venous thrombosis. Electronically Signed   By: Garald Balding M.D.   On: 10/21/2015 19:52   Dg Foot Complete Right  Result Date: 10/22/2015 CLINICAL DATA:  Right foot pain.  Swelling. EXAM: RIGHT FOOT COMPLETE - 3+ VIEW COMPARISON:  08/06/2008. FINDINGS: No acute bony or joint abnormality identified. No evidence of fracture dislocation. Diffuse soft tissue swelling noted. IMPRESSION: Diffuse soft tissue swelling.  No acute bony or joint abnormality. Electronically Signed   By: Marcello Moores  Register   On:  10/22/2015 09:28    ASSESSMENT AND PLAN  Lab Results  Component Value Date   NA 138 01/21/2014   K 4.4 01/21/2014   CL 102 01/21/2014   CO2 30 01/21/2014   Lab Results  Component Value Date   ALT 53 (H) 06/06/2011   AST 44 (H) 06/06/2011   ALKPHOS 66 06/06/2011   BILITOT 0.6 06/06/2011   Lab Results  Component Value Date   CREATININE 0.94 01/21/2014   Lab Results  Component Value Date   WBC 7.6 10/22/2015   HGB 13.7 10/22/2015   HCT 39.9 10/22/2015   MCV 86.5 10/22/2015   PLT 231 01/21/2014   No results found for: HAV, HEPAIGM, HEPBIGM, HEPBCAB, HBEAG, [place] was seen today for follow-up.  Diagnoses and all orders for this visit:  History of hepatitis B -     COMPLETE METABOLIC PANEL WITH GFR -     Hepatitis B surface antigen -     Hepatitis B surface antibody  Pleuritic pain: EKG and chest normal. No evidence of blood clot. See the 5 th problem.   -     EKG 12-Lead -     DG Chest 2 View; Future -     POCT CBC  Leg swelling: Unlikely CHF.  It is not a blood clot.  This may be gout and I will start her on meloxicam, however the most likely explanation is venous insufficiency.  Will advise ted hose if uric acid level is negative.   -     Brain natriuretic peptide  Screening for diabetes mellitus -     POCT glycosylated hemoglobin (Hb A1C)  Flank pain, acute: It appears she has a UTI.  CBC pointing away from pyelo.  Will treat with po keflex. I do question her history of Hep B as a possibility here.  I am defining this with labs.  She does have an echo from 2011 that showed abnormal liver parenchyma.   -     POCT Microscopic Urinalysis ([facility]) -     POCT urinalysis dipstick  Right foot pain: See the 3rd problem.  Starting meloxicam. Sed rate significantly elevated. Will add on RA and lupus screen.  -     DG Foot omplete Right; Future -     Uric Acid -     POCT SEDIMENTATION RATE  Chronic constipation: -     docusate sodium (COLACE) 100 MG  capsule; Take one to three every eight hour.tabs daily.  Use less if stools are loose, use more if constipated.    The patient is advised to call or return to clinic if she does not see an improvement in symptoms, or to seek the care of the closest emergency department if she worsens with the above plan.   Philis Fendt, MHS, PA-C [facility] 10/22/2015 11:31 AM

## 2015-10-23 LAB — URINE CULTURE: Organism ID, Bacteria: NO GROWTH

## 2015-10-23 LAB — HEPATITIS B SURFACE ANTIGEN: Hepatitis B Surface Ag: NEGATIVE

## 2015-10-23 LAB — HEPATITIS B SURFACE ANTIBODY, QUANTITATIVE: Hepatitis B-Post: 1000 m[IU]/mL

## 2015-10-23 LAB — RHEUMATOID FACTOR: Rhuematoid fact SerPl-aCnc: <14 IU/mL (ref <14)

## 2015-10-25 LAB — CYCLIC CITRUL PEPTIDE ANTIBODY, IGG: Cyclic Citrullin Peptide Ab: <16 Units

## 2015-10-25 LAB — ANA: ANA: NEGATIVE

## 2015-10-26 ENCOUNTER — Other Ambulatory Visit: Payer: Self-pay | Admitting: Physician Assistant

## 2015-10-26 MED ORDER — CYCLOBENZAPRINE HCL 10 MG PO TABS
5.0000 mg | ORAL_TABLET | Freq: Three times a day (TID) | ORAL | 0 refills | Status: DC | PRN
Start: 2015-10-26 — End: 2016-03-17

## 2015-10-26 NOTE — Progress Notes (Signed)
Her leg swelling is not caused by inflammatory arthritis, her heart, kidneys or liver.  Please advise that she go to a medical supply store and purchase some compression stockings.  With regard to her side pain, I am sending in for a low dose muscle relaxer.

## 2015-11-16 DIAGNOSIS — H5711 Ocular pain, right eye: Secondary | ICD-10-CM | POA: Diagnosis not present

## 2015-11-16 DIAGNOSIS — H01024 Squamous blepharitis left upper eyelid: Secondary | ICD-10-CM | POA: Diagnosis not present

## 2015-11-16 DIAGNOSIS — H2513 Age-related nuclear cataract, bilateral: Secondary | ICD-10-CM | POA: Diagnosis not present

## 2015-11-16 DIAGNOSIS — H0014 Chalazion left upper eyelid: Secondary | ICD-10-CM | POA: Diagnosis not present

## 2015-11-16 DIAGNOSIS — H01025 Squamous blepharitis left lower eyelid: Secondary | ICD-10-CM | POA: Diagnosis not present

## 2015-11-16 DIAGNOSIS — H10413 Chronic giant papillary conjunctivitis, bilateral: Secondary | ICD-10-CM | POA: Diagnosis not present

## 2015-11-16 DIAGNOSIS — H01022 Squamous blepharitis right lower eyelid: Secondary | ICD-10-CM | POA: Diagnosis not present

## 2015-11-16 DIAGNOSIS — H01021 Squamous blepharitis right upper eyelid: Secondary | ICD-10-CM | POA: Diagnosis not present

## 2015-12-01 ENCOUNTER — Ambulatory Visit (INDEPENDENT_AMBULATORY_CARE_PROVIDER_SITE_OTHER): Payer: Medicare Other

## 2015-12-01 DIAGNOSIS — Z23 Encounter for immunization: Secondary | ICD-10-CM | POA: Diagnosis not present

## 2015-12-14 DIAGNOSIS — Z1231 Encounter for screening mammogram for malignant neoplasm of breast: Secondary | ICD-10-CM | POA: Diagnosis not present

## 2016-03-14 ENCOUNTER — Telehealth: Payer: Self-pay | Admitting: Internal Medicine

## 2016-03-14 DIAGNOSIS — Z5181 Encounter for therapeutic drug level monitoring: Secondary | ICD-10-CM | POA: Diagnosis not present

## 2016-03-14 DIAGNOSIS — Z79899 Other long term (current) drug therapy: Secondary | ICD-10-CM | POA: Diagnosis not present

## 2016-03-14 DIAGNOSIS — Z0000 Encounter for general adult medical examination without abnormal findings: Secondary | ICD-10-CM | POA: Diagnosis not present

## 2016-03-14 NOTE — Telephone Encounter (Signed)
Left message for patient to call back  

## 2016-03-15 NOTE — Telephone Encounter (Signed)
I spoke with Lesly Rubenstein her daughter-in-law.  She will come for an office visit tomorrow with Alonza Bogus, PA

## 2016-03-16 ENCOUNTER — Ambulatory Visit: Payer: Medicare Other | Admitting: Gastroenterology

## 2016-03-17 ENCOUNTER — Ambulatory Visit (INDEPENDENT_AMBULATORY_CARE_PROVIDER_SITE_OTHER): Payer: Medicare Other | Admitting: Gastroenterology

## 2016-03-17 ENCOUNTER — Encounter: Payer: Self-pay | Admitting: Gastroenterology

## 2016-03-17 VITALS — BP 106/72 | HR 82 | Ht 60.0 in | Wt 203.4 lb

## 2016-03-17 DIAGNOSIS — K5732 Diverticulitis of large intestine without perforation or abscess without bleeding: Secondary | ICD-10-CM

## 2016-03-17 DIAGNOSIS — K625 Hemorrhage of anus and rectum: Secondary | ICD-10-CM | POA: Diagnosis not present

## 2016-03-17 MED ORDER — AMOXICILLIN-POT CLAVULANATE 875-125 MG PO TABS: 1.0000 | ORAL_TABLET | Freq: Two times a day (BID) | ORAL | 0 refills | Status: DC

## 2016-03-17 MED ORDER — HYDROCORTISONE ACETATE 25 MG RE SUPP: 25.0000 mg | Freq: Two times a day (BID) | RECTAL | 0 refills | Status: DC

## 2016-03-17 NOTE — Patient Instructions (Signed)
You have been scheduled for a flexible sigmoidoscopy. Please follow the written instructions given to you at your visit today. If you use inhalers (even only as needed), please bring them with you on the day of your procedure.   We will send in Augmentin and hydrocortisone suppositories to your pharmacy

## 2016-03-17 NOTE — Progress Notes (Addendum)
03/17/2016 timeko tidrick [id number] 1950/05/21   HISTORY OF PRESENT ILLNESS:  This is a pleasant 66 year old female who is known to Dr. Carlean Purl. She speaks primarily for Spanish so her daughter-in-law who is an employee in our office translated for her. She has a history of recurrent diverticulitis as documented on multiple CT scans. She had several episodes in 2013, but has not had any issues recently. She comes in today with complaints of left lower quadrant abdominal pain that began about 6 days ago and has not subsided/resolved. Says that it feels similar to her previous diverticulitis episodes.  No nausea, vomiting, fevers.  She also complains of rectal bleeding. She says that this occurs independent of this acute episode of diverticulitis/pain. She's had bleeding on and off for years. Does report some rectal/anal pain at times as well.   Past Medical History:  Diagnosis Date  . Chronic constipation   . Diverticulitis   . Diverticulosis   . Fatty liver   . GERD (gastroesophageal reflux disease)   . Hiatal hernia   . High cholesterol   . Hypertension   . Internal hemorrhoids   . PUD (peptic ulcer disease)   . 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 ABDOMINAL HYSTERECTOMY    . COLONOSCOPY  04/27/09    POLYPOID FRAGMENT OF COLONIC MUCOSA WITH SURFACE  . TUBAL LIGATION      reports that she has quit smoking. She has never used smokeless tobacco. She reports that she does not drink alcohol or use drugs. family history includes Diabetes in her sister. Allergies  Allergen Reactions  . Ciprofloxacin Hives    nausea  . Flagyl [Metronidazole] Hives    nausea      Outpatient Encounter Prescriptions as of 03/17/2016  Medication Sig  . aspirin EC 81 MG tablet Take 81 mg by mouth daily.  Marland Kitchen docusate sodium  (COLACE) 100 MG capsule Take one to three every eight hour.tabs daily.  Use less if stools are loose, use more if constipated.  . irbesartan (AVAPRO) 150 MG tablet Take 150 mg by mouth daily.   Marland Kitchen losartan (COZAAR) 100 MG tablet Take 100 mg by mouth daily.  . meloxicam (MOBIC) 7.5 MG tablet Take 1 tablet (7.5 mg total) by mouth daily.  . methocarbamol (ROBAXIN) 500 MG tablet Take 1 tablet (500 mg total) by mouth every 6 (six) hours as needed for muscle spasms. Every night regular  . pantoprazole (PROTONIX) 40 MG tablet Take 40 mg by mouth daily.  . polyethylene glycol powder (MIRALAX) powder Take 17 g by mouth daily.  . pravastatin (PRAVACHOL) 20 MG tablet Take 20 mg by mouth daily.  . sucralfate (CARAFATE) 1 G tablet Take 1 tablet (1 g total) by mouth 4 (four) times daily -  with meals and at bedtime.  . [DISCONTINUED] cephALEXin (KEFLEX) 500 MG capsule Take 1 capsule (500 mg total) by mouth 3 (three) times daily. (Patient not taking: Reported on 03/17/2016)  . [DISCONTINUED] cyclobenzaprine (FLEXERIL) 10 MG tablet Take 0.5-1 tablets (5-10 mg total) by mouth 3 (three) times daily as needed for muscle spasms. Take Tylenol 1000 mg along with this medication. (Patient not taking: Reported on 03/17/2016)  . [DISCONTINUED] meloxicam (MOBIC) 15 MG tablet Take 1 tablet (15 mg total) by mouth daily. (Patient not taking: Reported on 03/17/2016)   No facility-administered encounter medications on file as  of 03/17/2016.      REVIEW OF SYSTEMS  : All other systems reviewed and negative except where noted in the History of Present Illness.   PHYSICAL EXAM: BP 106/72   Pulse 82   Ht 5' (1.524 m)   Wt 203 lb 6.4 oz (92.3 kg)   BMI 39.72 kg/m  General: Well developed Hispanic female in no acute distress Head: Normocephalic and atraumatic Eyes:  Sclerae anicteric, conjunctiva pink. Ears: Normal auditory acuity Lungs: Clear throughout to auscultation Heart: Regular rate and rhythm Abdomen: Soft,  non-distended.  Normal bowel sounds.  LLQ TTP. Rectal:  External hemorrhoid noted.  There was a different appearance of the hemorrhoid, almost like a separate area that was prolapsing from the rectum that looked very irritated/ulcerated.  I am unsure if this is part of the hemorrhoid or if it was something different.  Anoscopy performed and revealed internal hemorrhoids but still unsure what that specific area is. Musculoskeletal: Symmetrical with no gross deformities  Skin: No lesions on visible extremities Extremities: No edema  Neurological: Alert oriented x 4, grossly non-focal Psychological:  Alert and cooperative. Normal mood and affect  ASSESSMENT AND PLAN: -LLQ abdominal pain:  Has history of recurrent diverticulitis but no issues for quite some time.  I suspect that she may have recurrence.  Will treat with Augmentin 875 mg BID for 10 days.   -Rectal bleeding/pain:  Has history of internal hemorrhoids.  Had internal and and external hemorrhoid on exam today.  There was an odd appearance to the external hemorrhoid as if it were either very irritated/ulcerated or there was a different piece (? Polyp) prolapsing near it.  I am going to treat it with hydrocortisone suppositories BID for 7 days.  I am also going to have her scheduled for a flex sig with Dr. Carlean Purl to evaluate it further, but we will wait a few weeks out and let her get over this suspected diverticulitis first.  The risks, benefits, and alternatives to flex sig were discussed with the patient and she consents to proceed.   Agree with Ms. Alphia Kava management.  [name], MD, [facility]

## 2016-03-20 ENCOUNTER — Other Ambulatory Visit (HOSPITAL_BASED_OUTPATIENT_CLINIC_OR_DEPARTMENT_OTHER): Payer: Self-pay

## 2016-03-20 DIAGNOSIS — G4700 Insomnia, unspecified: Secondary | ICD-10-CM

## 2016-03-20 DIAGNOSIS — R454 Irritability and anger: Secondary | ICD-10-CM

## 2016-03-20 DIAGNOSIS — G4730 Sleep apnea, unspecified: Secondary | ICD-10-CM

## 2016-03-20 DIAGNOSIS — R5383 Other fatigue: Secondary | ICD-10-CM

## 2016-03-20 DIAGNOSIS — G4710 Hypersomnia, unspecified: Secondary | ICD-10-CM

## 2016-03-20 DIAGNOSIS — R0683 Snoring: Secondary | ICD-10-CM

## 2016-03-29 DIAGNOSIS — M8589 Other specified disorders of bone density and structure, multiple sites: Secondary | ICD-10-CM | POA: Diagnosis not present

## 2016-04-03 ENCOUNTER — Ambulatory Visit (HOSPITAL_BASED_OUTPATIENT_CLINIC_OR_DEPARTMENT_OTHER): Payer: Medicare Other | Attending: Nurse Practitioner | Admitting: Internal Medicine

## 2016-04-03 VITALS — Ht 60.0 in | Wt 203.0 lb

## 2016-04-03 DIAGNOSIS — R454 Irritability and anger: Secondary | ICD-10-CM

## 2016-04-03 DIAGNOSIS — G4710 Hypersomnia, unspecified: Secondary | ICD-10-CM

## 2016-04-03 DIAGNOSIS — R5383 Other fatigue: Secondary | ICD-10-CM

## 2016-04-03 DIAGNOSIS — R0683 Snoring: Secondary | ICD-10-CM | POA: Diagnosis present

## 2016-04-03 DIAGNOSIS — G4730 Sleep apnea, unspecified: Secondary | ICD-10-CM

## 2016-04-03 DIAGNOSIS — G4736 Sleep related hypoventilation in conditions classified elsewhere: Secondary | ICD-10-CM | POA: Diagnosis not present

## 2016-04-03 DIAGNOSIS — G4700 Insomnia, unspecified: Secondary | ICD-10-CM

## 2016-04-03 DIAGNOSIS — G4733 Obstructive sleep apnea (adult) (pediatric): Secondary | ICD-10-CM | POA: Insufficient documentation

## 2016-04-08 DIAGNOSIS — G4733 Obstructive sleep apnea (adult) (pediatric): Secondary | ICD-10-CM | POA: Diagnosis not present

## 2016-04-08 NOTE — Procedures (Signed)
  Patient Name: Julie Dougherty, Julie Dougherty Study Date: 04/03/2016 Gender: Female D.O.B: 1950-05-23 Age (years): 67 Referring Provider: Rubbie Battiest NP Height (inches): 74 Interpreting Physician: Baird Lyons MD, ABSM Weight (lbs): 203 RPSGT: Zadie Rhine BMI: 40 MRN: [id number] Neck Size: 15.00 CLINICAL INFORMATION Sleep Study Type: NPSG  Indication for sleep study: Fatigue, Insomnia, Snoring, Witnesses Apnea / Gasping During Sleep  Epworth Sleepiness Score: 6  SLEEP STUDY TECHNIQUE As per the AASM Manual for the Scoring of Sleep and Associated Events v2.3 (April 2016) with a hypopnea requiring 4% desaturations.  The channels recorded and monitored were frontal, central and occipital EEG, electrooculogram (EOG), submentalis EMG (chin), nasal and oral airflow, thoracic and abdominal wall motion, anterior tibialis EMG, snore microphone, electrocardiogram, and pulse oximetry.  MEDICATIONS Medications self-administered by patient taken the night of the study : none reported  SLEEP ARCHITECTURE The study was initiated at 9:16:34 PM and ended at 4:34:45 AM.  Sleep onset time was 94.5 minutes and the sleep efficiency was 34.1%. The total sleep time was 149.5 minutes.  Stage REM latency was 274.0 minutes.  The patient spent 17.39% of the night in stage N1 sleep, 64.21% in stage N2 sleep, 0.00% in stage N3 and 18.39% in REM.  Alpha intrusion was absent.  Supine sleep was 64.55%.  RESPIRATORY PARAMETERS The overall apnea/hypopnea index (AHI) was 12.0 per hour. There were 29 total apneas, including 29 obstructive, 0 central and 0 mixed apneas. There were 1 hypopneas and 77 RERAs.  The AHI during Stage REM sleep was 39.3 per hour.  AHI while supine was 14.9 per hour.  The mean oxygen saturation was 92.86%. The minimum SpO2 during sleep was 64.00%.  Moderate snoring was noted during this study.  CARDIAC DATA The 2 lead EKG demonstrated sinus rhythm. The mean heart rate was  74.76 beats per minute. Other EKG findings include: PVCs.  LEG MOVEMENT DATA The total PLMS were 0 with a resulting PLMS index of 0.00. Associated arousal with leg movement index was 0.0 .  IMPRESSIONS - Mild obstructive sleep apnea occurred during this study (AHI = 12.0/h). - No significant central sleep apnea occurred during this study (CAI = 0.0/h). - Severe oxygen desaturation was noted during this study (Min O2 = 64.00%). - The patient snored with Moderate snoring volume. - EKG findings include PVCs. - Clinically significant periodic limb movements did not occur during sleep. No significant associated arousals. - Patient had difficulty initiating and maintaining sleep before 1:00 AM.  DIAGNOSIS - Obstructive Sleep Apnea (327.23 [G47.33 ICD-10]) - Nocturnal Hypoxemia (327.26 [G47.36 ICD-10])  RECOMMENDATIONS - Therapeutic CPAP titration to determine optimal pressure required to alleviate sleep disordered breathing. - Avoid alcohol, sedatives and other CNS depressants that may worsen sleep apnea and disrupt normal sleep architecture. - Sleep hygiene should be reviewed to assess factors that may improve sleep quality. - Weight management and regular exercise should be initiated or continued if appropriate.  [Electronically signed] 04/08/2016 02:03 PM  Baird Lyons MD, [place], American Board of Sleep Medicine   NPI: [id number]  [place], American Board of Sleep Medicine  ELECTRONICALLY SIGNED ON:  04/08/2016, 2:01 PM [place] PH: [phone]   FX: [phone] [place]

## 2016-05-04 ENCOUNTER — Encounter: Payer: Self-pay | Admitting: Internal Medicine

## 2016-05-04 ENCOUNTER — Ambulatory Visit (AMBULATORY_SURGERY_CENTER): Payer: Medicare Other | Admitting: Internal Medicine

## 2016-05-04 VITALS — BP 106/75 | HR 60 | Temp 96.8°F | Resp 20 | Ht 60.0 in | Wt 203.0 lb

## 2016-05-04 DIAGNOSIS — K625 Hemorrhage of anus and rectum: Secondary | ICD-10-CM | POA: Diagnosis not present

## 2016-05-04 DIAGNOSIS — I10 Essential (primary) hypertension: Secondary | ICD-10-CM | POA: Diagnosis not present

## 2016-05-04 DIAGNOSIS — K626 Ulcer of anus and rectum: Secondary | ICD-10-CM | POA: Diagnosis not present

## 2016-05-04 DIAGNOSIS — K219 Gastro-esophageal reflux disease without esophagitis: Secondary | ICD-10-CM | POA: Diagnosis not present

## 2016-05-04 DIAGNOSIS — G4733 Obstructive sleep apnea (adult) (pediatric): Secondary | ICD-10-CM | POA: Diagnosis not present

## 2016-05-04 DIAGNOSIS — K644 Residual hemorrhoidal skin tags: Secondary | ICD-10-CM

## 2016-05-04 DIAGNOSIS — K649 Unspecified hemorrhoids: Secondary | ICD-10-CM | POA: Diagnosis not present

## 2016-05-04 MED ORDER — SODIUM CHLORIDE 0.9 % IV SOLN: 500.0000 mL | INTRAVENOUS | Status: DC

## 2016-05-04 NOTE — Op Note (Signed)
[facility] Patient Name: Julie Dougherty Procedure Date: 05/04/2016 12:03 PM MRN: [id number] Endoscopist: [name] , MD Age: 66 Referring MD:  Date of Birth: 07/20/1950 Gender: Female Account #: [id number] Procedure:                Flexible Sigmoidoscopy Indications:              Rectal hemorrhage Medicines:                Propofol per Anesthesia, Monitored Anesthesia Care Procedure:                Pre-Anesthesia Assessment:                           - Prior to the procedure, a History and Physical                            was performed, and patient medications and                            allergies were reviewed. The patient's tolerance of                            previous anesthesia was also reviewed. The risks                            and benefits of the procedure and the sedation                            options and risks were discussed with the patient.                            All questions were answered, and informed consent                            was obtained. Prior Anticoagulants: The patient                            last took aspirin 1 day prior to the procedure. ASA                            Grade Assessment: II - A patient with mild systemic                            disease. After reviewing the risks and benefits,                            the patient was deemed in satisfactory condition to                            undergo the procedure.                           After obtaining informed consent, the scope was  passed under direct vision. The Endoscope was                            introduced through the anus and advanced to the the                            sigmoid colon. The flexible sigmoidoscopy was                            accomplished without difficulty. The patient                            tolerated the procedure well. The quality of the                            bowel preparation was  good. Scope In: 12:11:15 PM Scope Out: 12:14:21 PM Total Procedure Duration: 0 hours 3 minutes 6 seconds  Findings:                 The perianal exam findings include a skin tag.                           External and internal hemorrhoids were found during                            retroflexion. Biopsies were taken with a cold                            forceps for histology. ?leuoplakia like changes                            Verification of patient identification for the                            specimen was done. Estimated blood loss was minimal.                           The rectum and sigmoid colon appeared normal. Complications:            No immediate complications. Estimated Blood Loss:     Estimated blood loss was minimal. Impression:               - Perianal skin tag found on perianal exam.                           - External and internal hemorrhoids. Biopsied. ?                            leukoplakia changes vs inflammation                           - The rectum and sigmoid colon are normal. Recommendation:           - Patient has a contact number available for  emergencies. The signs and symptoms of potential                            delayed complications were discussed with the                            patient. Return to normal activities tomorrow.                            Written discharge instructions were provided to the                            patient.                           - Resume previous diet. [name], MD 05/04/2016 12:20:41 PM This report has been signed electronically.

## 2016-05-04 NOTE — Progress Notes (Signed)
Report to PACU, RN, vss, BBS= Clear.  

## 2016-05-04 NOTE — Progress Notes (Signed)
Called to room to assist during endoscopic procedure.  Patient ID and intended procedure confirmed with present staff. Received instructions for my participation in the procedure from the performing physician.  

## 2016-05-04 NOTE — Patient Instructions (Addendum)
   I think you have an irritated and inflamed hemorrhoid situation - I did take some biopsies to be sure. I expect you will see a bit more bleeding - try not to worry about that.  I appreciate the opportunity to care for you. [name], MD, FACG YOU HAD AN ENDOSCOPIC PROCEDURE TODAY: Refer to the procedure report and other information in the discharge instructions given to you for any specific questions about what was found during the examination. If this information does not answer your questions, please call [place] office at [phone] to clarify.   YOU SHOULD EXPECT: Some feelings of bloating in the abdomen. Passage of more gas than usual. Walking can help get rid of the air that was put into your GI tract during the procedure and reduce the bloating. If you had a lower endoscopy (such as a colonoscopy or flexible sigmoidoscopy) you may notice spotting of blood in your stool or on the toilet paper. Some abdominal soreness may be present for a day or two, also.  DIET: Your first meal following the procedure should be a light meal and then it is ok to progress to your normal diet. A half-sandwich or bowl of soup is an example of a good first meal. Heavy or fried foods are harder to digest and may make you feel nauseous or bloated. Drink plenty of fluids but you should avoid alcoholic beverages for 24 hours. If you had a esophageal dilation, please see attached instructions for diet.    ACTIVITY: Your care partner should take you home directly after the procedure. You should plan to take it easy, moving slowly for the rest of the day. You can resume normal activity the day after the procedure however YOU SHOULD NOT DRIVE, use power tools, machinery or perform tasks that involve climbing or major physical exertion for 24 hours (because of the sedation medicines used during the test).   SYMPTOMS TO REPORT IMMEDIATELY: A gastroenterologist can be reached at any hour. Please call [phone]   for any of the following symptoms:  Following lower endoscopy (colonoscopy, flexible sigmoidoscopy) Excessive amounts of blood in the stool  Significant tenderness, worsening of abdominal pains  Swelling of the abdomen that is new, acute  Fever of 100 or higher    FOLLOW UP:  If any biopsies were taken you will be contacted by phone or by letter within the next 1-3 weeks. Call [phone]  if you have not heard about the biopsies in 3 weeks.  Please also call with any specific questions about appointments or follow up tests.

## 2016-05-05 ENCOUNTER — Telehealth: Payer: Self-pay | Admitting: *Deleted

## 2016-05-05 NOTE — Telephone Encounter (Signed)
  Follow up Call-  Call back number 05/04/2016  Post procedure Call Back phone  # [phone]  Permission to leave phone message Yes  Some recent data might be hidden     Patient questions:  Do you have a fever, pain , or abdominal swelling? No. Pain Score  0 *  Have you tolerated food without any problems? Yes.    Have you been able to return to your normal activities? Yes.    Do you have any questions about your discharge instructions: Diet   No. Medications  No. Follow up visit  No.  Do you have questions or concerns about your Care? No.  Actions: * If pain score is 4 or above: No action needed, pain <4.

## 2016-05-11 NOTE — Progress Notes (Signed)
No cancer in bxs - think hemorrhoids are the problem Please explain to daughter-in-law Arnette Norris and arrange hemorrhoid banding  No recall or letter

## 2016-05-30 ENCOUNTER — Encounter: Payer: Self-pay | Admitting: Internal Medicine

## 2016-05-30 ENCOUNTER — Ambulatory Visit (INDEPENDENT_AMBULATORY_CARE_PROVIDER_SITE_OTHER): Payer: Medicare Other | Admitting: Internal Medicine

## 2016-05-30 VITALS — BP 98/60 | HR 74 | Ht 60.0 in | Wt 203.8 lb

## 2016-05-30 DIAGNOSIS — K648 Other hemorrhoids: Secondary | ICD-10-CM | POA: Diagnosis not present

## 2016-05-30 DIAGNOSIS — M7122 Synovial cyst of popliteal space [Baker], left knee: Secondary | ICD-10-CM

## 2016-05-30 NOTE — Assessment & Plan Note (Addendum)
Banded RA Stay on MiraLAx RTC 2-3 weeks band 1-2 more clumns

## 2016-05-30 NOTE — Patient Instructions (Addendum)
HEMORRHOID BANDING PROCEDURE    FOLLOW-UP CARE   1. The procedure you have had should have been relatively painless since the banding of the area involved does not have nerve endings and there is no pain sensation.  The rubber band cuts off the blood supply to the hemorrhoid and the band may fall off as soon as 48 hours after the banding (the band may occasionally be seen in the toilet bowl following a bowel movement). You may notice a temporary feeling of fullness in the rectum which should respond adequately to plain Tylenol or Motrin.  2. Following the banding, avoid strenuous exercise that evening and resume full activity the next day.  A sitz bath (soaking in a warm tub) or bidet is soothing, and can be useful for cleansing the area after bowel movements.     3. To avoid constipation, take two tablespoons of natural wheat bran, natural oat bran, flax, Benefiber or any over the counter fiber supplement and increase your water intake to 7-8 glasses daily.    4. Unless you have been prescribed anorectal medication, do not put anything inside your rectum for two weeks: No suppositories, enemas, fingers, etc.  5. Occasionally, you may have more bleeding than usual after the banding procedure.  This is often from the untreated hemorrhoids rather than the treated one.  Don't be concerned if there is a tablespoon or so of blood.  If there is more blood than this, lie flat with your bottom higher than your head and apply an ice pack to the area. If the bleeding does not stop within a half an hour or if you feel faint, call our office at [phone] or go to the emergency room.  6. Problems are not common; however, if there is a substantial amount of bleeding, severe pain, chills, fever or difficulty passing urine (very rare) or other problems, you should call us at [phone] or report to the nearest emergency room.  7. Do not stay seated continuously for more than 2-3 hours for a day or two  after the procedure.  Tighten your buttock muscles 10-15 times every two hours and take 10-15 deep breaths every 1-2 hours.  Do not spend more than a few minutes on the toilet if you cannot empty your bowel; instead re-visit the toilet at a later time.    Stay on your miralax per Dr Carlean Purl.   We have made you an appointment with Dr Zollie Beckers for them to check your left knee for a bakers cyst.  Their phone # is [phone] , [address], [place].  The appointment date is 06/12/16 at 2:30pm   I appreciate the opportunity to care for you. Silvano Rusk, MD, [facility]

## 2016-05-30 NOTE — Progress Notes (Signed)
    Hemorrhoid sxs: bleeding, protrusion, soreness  Patti [place], CMA present.  Rectal: Mildly tender no fissure, anoderm intact, sl erythema  Anoscopy: Gr 2 inflamed internal hemorrhoids all positions  PROCEDURE NOTE: The patient presents with symptomatic grade 2  hemorrhoids, requesting rubber band ligation of his/her hemorrhoidal disease.  All risks, benefits and alternative forms of therapy were described and informed consent was obtained.   The anorectum was pre-medicated with 0.125% NTG and 5% lidocaine The decision was made to band the RA internal hemorrhoid, and the [place] was used to perform band ligation without complication.  Digital anorectal examination was then performed to assure proper positioning of the band, and to adjust the banded tissue as required.  The patient was discharged home without pain or other issues.  Dietary and behavioral recommendations were given and along with follow-up instructions.     The following adjunctive treatments were recommended:  Stay on MiraLax for constipation  The patient will return in 2-3 weeks for  follow-up and possible additional banding as required. No complications were encountered and the patient tolerated the procedure well.   She was also c/o pain in left popliteal fossa and exam suggests Baker's Cyst which is mildly tender - will refer to ortho Handout about Baker's Cyst in Spanish provided and daughter-in-law was present to interpret today

## 2016-06-12 ENCOUNTER — Ambulatory Visit (INDEPENDENT_AMBULATORY_CARE_PROVIDER_SITE_OTHER): Payer: Medicare Other

## 2016-06-12 ENCOUNTER — Ambulatory Visit (INDEPENDENT_AMBULATORY_CARE_PROVIDER_SITE_OTHER): Payer: Medicare Other | Admitting: Physician Assistant

## 2016-06-12 ENCOUNTER — Encounter (INDEPENDENT_AMBULATORY_CARE_PROVIDER_SITE_OTHER): Payer: Self-pay | Admitting: Physician Assistant

## 2016-06-12 DIAGNOSIS — M25562 Pain in left knee: Secondary | ICD-10-CM | POA: Diagnosis not present

## 2016-06-12 MED ORDER — METHYLPREDNISOLONE ACETATE 40 MG/ML IJ SUSP
40.0000 mg | INTRAMUSCULAR | Status: AC | PRN
  Administered 2016-06-12: 40 mg via INTRA_ARTICULAR

## 2016-06-12 MED ORDER — LIDOCAINE HCL 1 % IJ SOLN
5.0000 mL | INTRAMUSCULAR | Status: AC | PRN
  Administered 2016-06-12: 5 mL

## 2016-06-12 NOTE — Progress Notes (Signed)
Office Visit Note   Patient: Julie Dougherty           Date of Birth: May 29, 1950           MRN: [id number] Visit Date: 06/12/2016              Requested by: No referring provider defined for this encounter. PCP: System, Provider Not In   Assessment & Plan: Visit Diagnoses:  1. Acute pain of left knee     Plan: Discussed with her quad strengthening exercises. Would not place her on any NSAIDs due to her history of GERD and rectal bleeding. See her back in 2 weeks check her progress. If she continues to have pain and mechanical symptoms  despite conservative treatment recommend MRI to rule out meniscal tear.  Follow-Up Instructions: Return in about 2 weeks (around 06/26/2016).   Orders:  Orders Placed This Encounter  Procedures  . XR KNEE 3 VIEW LEFT   No orders of the defined types were placed in this encounter.     Procedures: Large Joint Inj Date/Time: 06/12/2016 4:46 PM Performed by: Pete Pelt Authorized by: Pete Pelt   Consent Given by:  Patient Indications:  Pain Location:  Knee Site:  L knee Needle Size:  22 G Approach:  Superolateral Ultrasound Guidance: No   Fluoroscopic Guidance: No   Medications:  40 mg methylPREDNISolone acetate 40 MG/ML; 5 mL lidocaine 1 % Aspiration Attempted: No   Aspirate amount (mL):  10 Aspirate:  Yellow Patient tolerance:  Patient tolerated the procedure well with no immediate complications   GERD rectal bleeding  Clinical Data: No additional findings.   Subjective: Chief Complaint  Patient presents with  . Left Knee - Pain    HPI Julie Dougherty is a 66 year old female comes in today for left knee pain and swelling for a month. An interpreter is present as patient does not speak fluent [place]. She's had no known injury to the left knee. Pain is in the back of the knee and down into the shin area. She has mechanical symptoms for giving way and locking or painful popping. She's taken no  medications for this had no treatment for it. She was referred by Dr. Carlean Purl her gastroenterologist for possible Baker's cyst source of her pain. Review of Systems Please see history of present illness otherwise negative  Objective: Vital Signs: There were no vitals taken for this visit.  Physical Exam  Constitutional: She is oriented to person, place, and time. She appears well-developed and well-nourished. No distress.  Pulmonary/Chest: Effort normal.  Neurological: She is alert and oriented to person, place, and time.  Psychiatric: She has a normal mood and affect. Her behavior is normal.    Ortho Exam Bilateral knee she has full extension flexion to 115 on the right proximal nail 110 on the left but this is painful. She has tenderness along medial joint line of the left knee. Slight effusion left knee. No instability valgus varus stressing either knee. Anterior drawer is negative bilaterally. Positive McMurray's on the left negative on the right. Specialty Comments:  No specialty comments available.  Imaging: Xr Knee 3 View Left  Result Date: 06/12/2016 3 views bilateral knees: No acute fracture. Medial lateral joint lines well-preserved both knees. Sunrise view both knees shows slight lateralization of the left patella. Otherwise patellofemoral joint is well-preserved.    PMFS History: Patient Active Problem List   Diagnosis Date Noted  . Baker's cyst of knee,  left - suspected 05/30/2016  . Diverticulitis of colon 03/17/2016  . Hemorrhoids, internal, with bleeding Gr 2 prolapsed 06/30/2011  . Diverticulitis 06/06/2011  . Rectal bleeding 04/19/2009  . ABDOMINAL PAIN, RIGHT LOWER QUADRANT 04/19/2009  . HYPERLIPIDEMIA 07/11/2006  . HYPERTENSION, ESSENTIAL NOS 07/11/2006  . GERD 07/11/2006  . HEPATITIS B, HX OF 07/11/2006   Past Medical History:  Diagnosis Date  . Arthritis   . Cataract   . Chronic constipation   . Diverticulitis   . Diverticulosis   . Fatty liver     . GERD (gastroesophageal reflux disease)   . Hemorrhoids, internal, with bleeding Gr 2 prolapsed 06/30/2011   Found on colonoscopy 2011 and anoscopy May 2013   . Hiatal hernia   . High cholesterol   . Hypertension   . Internal hemorrhoids   . OSA (obstructive sleep apnea)   . PUD (peptic ulcer disease)   . Rectal ulceration 04/27/2009   Associated reactive/regenerative changes and fibromuscular extensions into lamina propria/Mucosal Prolapse  . Sleep apnea    Waiting on CPAP machine    Family History  Problem Relation Age of Onset  . Diabetes Sister   . Colon cancer Neg Hx     Past Surgical History:  Procedure Laterality Date  . ABDOMINAL HYSTERECTOMY    . COLONOSCOPY  04/27/09    POLYPOID FRAGMENT OF COLONIC MUCOSA WITH SURFACE  . TUBAL LIGATION     Social History   Occupational History  . Not on file.   Social History Main Topics  . Smoking status: Former Research scientist (life sciences)  . Smokeless tobacco: Never Used     Comment: "when she was young"  . Alcohol use No  . Drug use: No  . Sexual activity: Not on file

## 2016-06-19 ENCOUNTER — Encounter: Payer: Self-pay | Admitting: Internal Medicine

## 2016-06-19 ENCOUNTER — Ambulatory Visit (INDEPENDENT_AMBULATORY_CARE_PROVIDER_SITE_OTHER): Payer: Medicare Other | Admitting: Internal Medicine

## 2016-06-19 DIAGNOSIS — K648 Other hemorrhoids: Secondary | ICD-10-CM

## 2016-06-19 NOTE — Progress Notes (Signed)
   HEMORRHOID BANDING  SXS: Less bleeding  PROCEDURE NOTE: The patient presents with symptomatic grade 2 to 3 hemorrhoids, requesting rubber band ligation of his/her hemorrhoidal disease.  All risks, benefits and alternative forms of therapy were described and informed consent was obtained.   The anorectum was pre-medicated with 0.125% nitroglycerin and 5% lidocaine The decision was made to band the RP and LL internal hemorrhoids, and the [place] was used to perform band ligation without complication.  Digital anorectal examination was then performed to assure proper positioning of the band, and to adjust the banded tissue as required.  The patient was discharged home without pain or other issues.  Dietary and behavioral recommendations were given and along with follow-up instructions.     The following adjunctive treatments were recommended:  MiraLAX  The patient will return in as needed for  follow-up and possible additional banding as required. No complications were encountered and the patient tolerated the procedure well.

## 2016-06-19 NOTE — Patient Instructions (Signed)
   We banded 2 hemorrhoids today. If still having problems after July could need another treatment.  Let me know.  I appreciate the opportunity to care for you. [name], MD, FACG  HEMORRHOID BANDING PROCEDURE    FOLLOW-UP CARE   1. The procedure you have had should have been relatively painless since the banding of the area involved does not have nerve endings and there is no pain sensation.  The rubber band cuts off the blood supply to the hemorrhoid and the band may fall off as soon as 48 hours after the banding (the band may occasionally be seen in the toilet bowl following a bowel movement). You may notice a temporary feeling of fullness in the rectum which should respond adequately to plain Tylenol or Motrin.  2. Following the banding, avoid strenuous exercise that evening and resume full activity the next day.  A sitz bath (soaking in a warm tub) or bidet is soothing, and can be useful for cleansing the area after bowel movements.     3. To avoid constipation, take two tablespoons of natural wheat bran, natural oat bran, flax, Benefiber or any over the counter fiber supplement and increase your water intake to 7-8 glasses daily.    4. Unless you have been prescribed anorectal medication, do not put anything inside your rectum for two weeks: No suppositories, enemas, fingers, etc.  5. Occasionally, you may have more bleeding than usual after the banding procedure.  This is often from the untreated hemorrhoids rather than the treated one.  Don't be concerned if there is a tablespoon or so of blood.  If there is more blood than this, lie flat with your bottom higher than your head and apply an ice pack to the area. If the bleeding does not stop within a half an hour or if you feel faint, call our office at [phone] or go to the emergency room.  6. Problems are not common; however, if there is a substantial amount of bleeding, severe pain, chills, fever or difficulty  passing urine (very rare) or other problems, you should call us at [phone] or report to the nearest emergency room.  7. Do not stay seated continuously for more than 2-3 hours for a day or two after the procedure.  Tighten your buttock muscles 10-15 times every two hours and take 10-15 deep breaths every 1-2 hours.  Do not spend more than a few minutes on the toilet if you cannot empty your bowel; instead re-visit the toilet at a later time.

## 2016-06-23 NOTE — Assessment & Plan Note (Signed)
Left lateral right posterior banded return as needed

## 2016-06-28 ENCOUNTER — Ambulatory Visit (INDEPENDENT_AMBULATORY_CARE_PROVIDER_SITE_OTHER): Payer: Medicare Other | Admitting: Physician Assistant

## 2016-06-28 DIAGNOSIS — M25562 Pain in left knee: Secondary | ICD-10-CM | POA: Diagnosis not present

## 2016-06-28 DIAGNOSIS — M25561 Pain in right knee: Secondary | ICD-10-CM | POA: Insufficient documentation

## 2016-06-28 NOTE — Progress Notes (Signed)
[name] returns today follow-up of her left knee pain. She continues to have pain in the knee she states her knee pain is actually worse than it was prior to the injection. A family member interprets for her today. She's continues to have giving way and painful popping in the knee.  Physical exam: Left knee positive effusion and positive McMurray's. Tenderness along medial joint line. She has full extension and flexion beyond 90. No instability valgus varus stressing. Calf is nontender.  Plan: MRI left knee to rule out medial meniscal tear. Patient has failed conservative treatment and continues to have pain and mechanical symptoms of the knee. She'll follow up with [place] after the MRI to go over the results and discuss further treatment.

## 2016-06-30 ENCOUNTER — Other Ambulatory Visit (INDEPENDENT_AMBULATORY_CARE_PROVIDER_SITE_OTHER): Payer: Self-pay

## 2016-06-30 DIAGNOSIS — M25562 Pain in left knee: Secondary | ICD-10-CM

## 2016-07-14 ENCOUNTER — Ambulatory Visit
Admission: RE | Admit: 2016-07-14 | Discharge: 2016-07-14 | Disposition: A | Discharge status: Home / Self Care | Payer: Medicare Other | Source: Ambulatory Visit | Attending: Physician Assistant | Admitting: Physician Assistant

## 2016-07-14 DIAGNOSIS — M25562 Pain in left knee: Secondary | ICD-10-CM | POA: Diagnosis not present

## 2016-07-17 ENCOUNTER — Ambulatory Visit (INDEPENDENT_AMBULATORY_CARE_PROVIDER_SITE_OTHER): Payer: Medicare Other | Admitting: Orthopaedic Surgery

## 2016-07-17 DIAGNOSIS — M1712 Unilateral primary osteoarthritis, left knee: Secondary | ICD-10-CM | POA: Insufficient documentation

## 2016-07-17 DIAGNOSIS — M25562 Pain in left knee: Secondary | ICD-10-CM

## 2016-07-17 NOTE — Progress Notes (Signed)
Office Visit Note   Patient: Julie Dougherty           Date of Birth: [birth year]           MRN: [id number] Visit Date: 07/17/2016              Requested by: No referring provider defined for this encounter. PCP: System, Provider Not In   Assessment & Plan: Visit Diagnoses:  1. Acute pain of left knee   2. Unilateral primary osteoarthritis, left knee     Plan: The arthritis of her left knee is quite severe and her pain is quite severe. There is really no other options other than considering knee replacement surgery at this point. I talked her and her family in length showing her knee model and going over the MRI results as well showing her knee replacement model. Given the significant cartilage loss in the knee combined with the continued effusion or worsening pain and is detrimental effect his cataract is daily living, her quality of life, and her mobility we are recommending a knee replacement. We talked in detail ball of the surgery involves including having a thorough discussion about risks and benefits of surgery. I talked about what her intraoperative and postoperative course with involved. All questions were encouraged and answered. She and her family and shouldn't having the set up in the near future.  Follow-Up Instructions: Return for 2 weeks post-op.   Orders:  No orders of the defined types were placed in this encounter.  No orders of the defined types were placed in this encounter.     Procedures: No procedures performed   Clinical Data: No additional findings.   Subjective: No chief complaint on file. Patient is a very pleasant 66 year old who is return for follow-up after MRI of her left knee. She's had such severe pain in that knee that she does not want to put weight on it this point. She was referred to [place] and x-rays were obtained showing some only mild to moderate arthritic changes. The knee is remain swollen and we place a steroid injection in her  knee and her knee pain actually got worse. She is here for follow-up today after having an MRI of that knee and to further evaluate and treat what may be going on with her knee. Her family is with her interpreter's for her since she does not speak any English at all. They report that her pain is still worsening and it is detrimentally affected her activities daily living, her mobility, and her quality of life.  HPI  Review of Systems She currently denies any headache, chest pain, short of breath, fever, chills, nausea, vomiting  Objective: Vital Signs: There were no vitals taken for this visit.  Physical Exam She is alert and oriented 3 and in no acute distress but obvious discomfort she walks with significant guarding of her left knee Ortho Exam Examination of her left knee shows warmth of the knee joint. She has a moderate effusion. She has pain along the medial joint line and good range of motion. She has significant patellofemoral crepitation and global tenderness. Specialty Comments:  No specialty comments available.  Imaging: No results found. Both plain films and the MRI independently reviewed again and surprisingly shows severe arthritic changes throughout her left knee. It's mainly the medial femoral condyle that has complete denuding of cartilage over wide area. They're stress changes in the medial femoral condyle and the medial tibial plateau. There is meniscal  tearing with extrusion of the meniscus. There is also tearing of the medial patellofemoral retinacular ligament and obvious ligamentous disruption knee in general.  PMFS History: Patient Active Problem List   Diagnosis Date Noted  . Unilateral primary osteoarthritis, left knee 07/17/2016  . Acute pain of left knee 06/28/2016  . Baker's cyst of knee, left - suspected 05/30/2016  . Diverticulitis of colon 03/17/2016  . Hemorrhoids, internal, with bleeding Gr 2 prolapsed 06/30/2011  . Diverticulitis 06/06/2011  . Rectal  bleeding 04/19/2009  . ABDOMINAL PAIN, RIGHT LOWER QUADRANT 04/19/2009  . HYPERLIPIDEMIA 07/11/2006  . HYPERTENSION, ESSENTIAL NOS 07/11/2006  . GERD 07/11/2006  . HEPATITIS B, HX OF 07/11/2006   Past Medical History:  Diagnosis Date  . Arthritis   . Cataract   . Chronic constipation   . Diverticulitis   . Diverticulosis   . Fatty liver   . GERD (gastroesophageal reflux disease)   . Hemorrhoids, internal, with bleeding Gr 2 prolapsed 06/30/2011   Found on colonoscopy 2011 and anoscopy May 2013   . Hiatal hernia   . High cholesterol   . Hypertension   . Internal hemorrhoids   . OSA (obstructive sleep apnea)   . PUD (peptic ulcer disease)   . Rectal ulceration 04/27/2009   Associated reactive/regenerative changes and fibromuscular extensions into lamina propria/Mucosal Prolapse  . Sleep apnea    Waiting on CPAP machine    Family History  Problem Relation Age of Onset  . Diabetes Sister   . Colon cancer Neg Hx     Past Surgical History:  Procedure Laterality Date  . ABDOMINAL HYSTERECTOMY    . COLONOSCOPY  04/27/09    POLYPOID FRAGMENT OF COLONIC MUCOSA WITH SURFACE  . HEMORRHOID BANDING    . TUBAL LIGATION     Social History   Occupational History  . Not on file.   Social History Main Topics  . Smoking status: Former Research scientist (life sciences)  . Smokeless tobacco: Never Used     Comment: "when she was young"  . Alcohol use No  . Drug use: No  . Sexual activity: Not on file

## 2016-08-17 ENCOUNTER — Other Ambulatory Visit (INDEPENDENT_AMBULATORY_CARE_PROVIDER_SITE_OTHER): Payer: Self-pay | Admitting: Orthopaedic Surgery

## 2016-08-17 DIAGNOSIS — M1712 Unilateral primary osteoarthritis, left knee: Secondary | ICD-10-CM

## 2016-08-18 ENCOUNTER — Other Ambulatory Visit (INDEPENDENT_AMBULATORY_CARE_PROVIDER_SITE_OTHER): Payer: Self-pay | Admitting: Orthopaedic Surgery

## 2016-08-21 NOTE — Pre-Procedure Instructions (Signed)
Julie Dougherty  08/21/2016      [place], [place] [address] Phone: [phone] Fax: [phone]    Your procedure is scheduled on    Tuesday  08/29/16  Report to [facility] Admitting at 1030 A.M.  Call this number if you have problems the morning of surgery:  [phone]   Remember:  Do not eat food or drink liquids after midnight.  Take these medicines the morning of surgery with A SIP OF WATER    PANTOPRAZOLE  7 days prior to surgery STOP taking any Aspirin, Aleve, Naproxen, Ibuprofen, Motrin, Advil, Goody's, BC's, all herbal medications, fish oil, and all vitamins   Do not wear jewelry, make-up or nail polish.  Do not wear lotions, powders, or perfumes, or deoderant.  Do not shave 48 hours prior to surgery.  Men may shave face and neck.  Do not bring valuables to the hospital.  [facility] is not responsible for any belongings or valuables.  Contacts, dentures or bridgework may not be worn into surgery.  Leave your suitcase in the car.  After surgery it may be brought to your room.  For patients admitted to the hospital, discharge time will be determined by your treatment team.  Patients discharged the day of surgery will not be allowed to drive home.   Name and phone number of your driver:    Special instructions:  [place] - Preparing for Surgery  Before surgery, you can play an important role.  Because skin is not sterile, your skin needs to be as free of germs as possible.  You can reduce the number of germs on you skin by washing with CHG (chlorahexidine gluconate) soap before surgery.  CHG is an antiseptic cleaner which kills germs and bonds with the skin to continue killing germs even after washing.  Please DO NOT use if you have an allergy to CHG or antibacterial soaps.  If your skin becomes reddened/irritated stop using the CHG and inform your nurse  when you arrive at Short Stay.  Do not shave (including legs and underarms) for at least 48 hours prior to the first CHG shower.  You may shave your face.  Please follow these instructions carefully:   1.  Shower with CHG Soap the night before surgery and the                                morning of Surgery.  2.  If you choose to wash your hair, wash your hair first as usual with your       normal shampoo.  3.  After you shampoo, rinse your hair and body thoroughly to remove the                      Shampoo.  4.  Use CHG as you would any other liquid soap.  You can apply chg directly       to the skin and wash gently with scrungie or a clean washcloth.  5.  Apply the CHG Soap to your body ONLY FROM THE NECK DOWN.        Do not use on open wounds or open sores.  Avoid contact with your eyes,       ears, mouth and genitals (private parts).  Wash genitals (private parts)  with your normal soap.  6.  Wash thoroughly, paying special attention to the area where your surgery        will be performed.  7.  Thoroughly rinse your body with warm water from the neck down.  8.  DO NOT shower/wash with your normal soap after using and rinsing off       the CHG Soap.  9.  Pat yourself dry with a clean towel.            10.  Wear clean pajamas.            11.  Place clean sheets on your bed the night of your first shower and do not        sleep with pets.  Day of Surgery  Do not apply any lotions/deoderants the morning of surgery.  Please wear clean clothes to the hospital/surgery center.    Please read over the following fact sheets that you were given. Pain Booklet, Coughing and Deep Breathing, MRSA Information and Surgical Site Infection Prevention

## 2016-08-22 ENCOUNTER — Encounter (HOSPITAL_COMMUNITY)
Admission: RE | Admit: 2016-08-22 | Discharge: 2016-08-22 | Disposition: A | Discharge status: Home / Self Care | Payer: Medicare Other | Source: Ambulatory Visit | Attending: Orthopaedic Surgery | Admitting: Orthopaedic Surgery

## 2016-08-22 ENCOUNTER — Encounter (HOSPITAL_COMMUNITY): Payer: Self-pay

## 2016-08-22 DIAGNOSIS — Z01812 Encounter for preprocedural laboratory examination: Secondary | ICD-10-CM | POA: Insufficient documentation

## 2016-08-22 HISTORY — DX: Inflammatory liver disease, unspecified: K75.9

## 2016-08-22 LAB — CBC
HCT: 46.2 % — ABNORMAL HIGH (ref 36.0–46.0)
HEMOGLOBIN: 14.7 g/dL (ref 12.0–15.0)
MCH: 28.2 pg (ref 26.0–34.0)
MCHC: 31.8 g/dL (ref 30.0–36.0)
MCV: 88.5 fL (ref 78.0–100.0)
Platelets: 231 10*3/uL (ref 150–400)
RBC: 5.22 MIL/uL — AB (ref 3.87–5.11)
RDW: 13.9 % (ref 11.5–15.5)
WBC: 6.2 10*3/uL (ref 4.0–10.5)

## 2016-08-22 LAB — COMPREHENSIVE METABOLIC PANEL
ALBUMIN: 4.1 g/dL (ref 3.5–5.0)
ALK PHOS: 79 U/L (ref 38–126)
ALT: 19 U/L (ref 14–54)
AST: 24 U/L (ref 15–41)
Anion gap: 10 (ref 5–15)
BILIRUBIN TOTAL: 0.9 mg/dL (ref 0.3–1.2)
BUN: 9 mg/dL (ref 6–20)
CALCIUM: 9.4 mg/dL (ref 8.9–10.3)
CO2: 24 mmol/L (ref 22–32)
CREATININE: 0.93 mg/dL (ref 0.44–1.00)
Chloride: 105 mmol/L (ref 101–111)
GFR calc Af Amer: >60 mL/min (ref >60)
GFR calc non Af Amer: >60 mL/min (ref >60)
GLUCOSE: 89 mg/dL (ref 65–99)
Potassium: 4.1 mmol/L (ref 3.5–5.1)
SODIUM: 139 mmol/L (ref 135–145)
TOTAL PROTEIN: 7.9 g/dL (ref 6.5–8.1)

## 2016-08-22 LAB — SURGICAL PCR SCREEN
MRSA, PCR: NEGATIVE
Staphylococcus aureus: NEGATIVE

## 2016-08-22 NOTE — Pre-Procedure Instructions (Addendum)
Julie Dougherty  08/22/2016      [address] [place], [place] [address] Phone: [phone] Fax: [phone]    Your procedure is scheduled on    Tuesday  08/29/16  Report to [facility] Admitting at 1030 A.M.  Call this number if you have problems the morning of surgery:  [phone]   Remember:  Do not eat food or drink liquids after midnight.  Take these medicines the morning of surgery with A SIP OF WATER    PANTOPRAZOLE(protonix)  7 days prior to surgery starting today 08/22/16 STOP taking any Aspirin, Aleve, Naproxen, Ibuprofen, Motrin, Advil, Goody's, BC's, all herbal medications, fish oil, and all vitamins   Do not wear jewelry, make-up or nail polish.  Do not wear lotions, powders, or perfumes, or deoderant.  Do not shave 48 hours prior to surgery.  Men may shave face and neck.  Do not bring valuables to the hospital.  [facility] is not responsible for any belongings or valuables.  Contacts, dentures or bridgework may not be worn into surgery.  Leave your suitcase in the car.  After surgery it may be brought to your room.  For patients admitted to the hospital, discharge time will be determined by your treatment team.  Patients discharged the day of surgery will not be allowed to drive home.   Name and phone number of your driver:    Special instructions:  [place] - Preparing for Surgery  Before surgery, you can play an important role.  Because skin is not sterile, your skin needs to be as free of germs as possible.  You can reduce the number of germs on you skin by washing with CHG (chlorahexidine gluconate) soap before surgery.  CHG is an antiseptic cleaner which kills germs and bonds with the skin to continue killing germs even after washing.  Please DO NOT use if you have an allergy to CHG or antibacterial soaps.  If your skin becomes reddened/irritated stop using  the CHG and inform your nurse when you arrive at Short Stay.  Do not shave (including legs and underarms) for at least 48 hours prior to the first CHG shower.  You may shave your face.  Please follow these instructions carefully:   1.  Shower with CHG Soap the night before surgery and the                                morning of Surgery.  2.  If you choose to wash your hair, wash your hair first as usual with your       normal shampoo.  3.  After you shampoo, rinse your hair and body thoroughly to remove the                      Shampoo.  4.  Use CHG as you would any other liquid soap.  You can apply chg directly       to the skin and wash gently with scrungie or a clean washcloth.  5.  Apply the CHG Soap to your body ONLY FROM THE NECK DOWN.        Do not use on open wounds or open sores.  Avoid contact with your eyes,       ears, mouth and genitals (private parts).  Wash  genitals (private parts)       with your normal soap.  6.  Wash thoroughly, paying special attention to the area where your surgery        will be performed.  7.  Thoroughly rinse your body with warm water from the neck down.  8.  DO NOT shower/wash with your normal soap after using and rinsing off       the CHG Soap.  9.  Pat yourself dry with a clean towel.            10.  Wear clean pajamas.            11.  Place clean sheets on your bed the night of your first shower and do not        sleep with pets.  Day of Surgery  Do not apply any lotions/deoderants the morning of surgery.  Please wear clean clothes to the hospital/surgery center.    Please read over the  fact sheets that you were given.

## 2016-08-28 MED ORDER — TRANEXAMIC ACID 1000 MG/10ML IV SOLN
1000.0000 mg | INTRAVENOUS | Status: DC
  Filled 2016-08-28: qty 10

## 2016-08-29 ENCOUNTER — Encounter (HOSPITAL_COMMUNITY)
Admission: RE | Disposition: A | Discharge status: Home Health | Payer: Self-pay | Source: Ambulatory Visit | Attending: Orthopaedic Surgery

## 2016-08-29 ENCOUNTER — Ambulatory Visit (HOSPITAL_COMMUNITY): Payer: Medicare Other | Admitting: Anesthesiology

## 2016-08-29 ENCOUNTER — Encounter (HOSPITAL_COMMUNITY): Payer: Self-pay | Admitting: Anesthesiology

## 2016-08-29 ENCOUNTER — Inpatient Hospital Stay (HOSPITAL_COMMUNITY)
Admission: RE | Admit: 2016-08-29 | Discharge: 2016-08-31 | DRG: 470 | Disposition: A | Discharge status: Home Health | Payer: Medicare Other | Source: Ambulatory Visit | Attending: Orthopaedic Surgery | Admitting: Orthopaedic Surgery

## 2016-08-29 ENCOUNTER — Inpatient Hospital Stay (HOSPITAL_COMMUNITY): Payer: Medicare Other

## 2016-08-29 DIAGNOSIS — I10 Essential (primary) hypertension: Secondary | ICD-10-CM | POA: Diagnosis present

## 2016-08-29 DIAGNOSIS — K5909 Other constipation: Secondary | ICD-10-CM | POA: Diagnosis present

## 2016-08-29 DIAGNOSIS — E785 Hyperlipidemia, unspecified: Secondary | ICD-10-CM | POA: Diagnosis present

## 2016-08-29 DIAGNOSIS — K760 Fatty (change of) liver, not elsewhere classified: Secondary | ICD-10-CM | POA: Diagnosis present

## 2016-08-29 DIAGNOSIS — K219 Gastro-esophageal reflux disease without esophagitis: Secondary | ICD-10-CM | POA: Diagnosis present

## 2016-08-29 DIAGNOSIS — M659 Synovitis and tenosynovitis, unspecified: Secondary | ICD-10-CM | POA: Diagnosis present

## 2016-08-29 DIAGNOSIS — Z888 Allergy status to other drugs, medicaments and biological substances status: Secondary | ICD-10-CM | POA: Diagnosis not present

## 2016-08-29 DIAGNOSIS — Z881 Allergy status to other antibiotic agents status: Secondary | ICD-10-CM | POA: Diagnosis not present

## 2016-08-29 DIAGNOSIS — Z8711 Personal history of peptic ulcer disease: Secondary | ICD-10-CM

## 2016-08-29 DIAGNOSIS — Z471 Aftercare following joint replacement surgery: Secondary | ICD-10-CM | POA: Diagnosis not present

## 2016-08-29 DIAGNOSIS — G8918 Other acute postprocedural pain: Secondary | ICD-10-CM | POA: Diagnosis not present

## 2016-08-29 DIAGNOSIS — M1712 Unilateral primary osteoarthritis, left knee: Secondary | ICD-10-CM | POA: Diagnosis not present

## 2016-08-29 DIAGNOSIS — M25462 Effusion, left knee: Secondary | ICD-10-CM | POA: Diagnosis present

## 2016-08-29 DIAGNOSIS — G4733 Obstructive sleep apnea (adult) (pediatric): Secondary | ICD-10-CM | POA: Diagnosis present

## 2016-08-29 DIAGNOSIS — Z96652 Presence of left artificial knee joint: Secondary | ICD-10-CM | POA: Diagnosis not present

## 2016-08-29 DIAGNOSIS — Z90710 Acquired absence of both cervix and uterus: Secondary | ICD-10-CM

## 2016-08-29 DIAGNOSIS — M25562 Pain in left knee: Secondary | ICD-10-CM | POA: Diagnosis not present

## 2016-08-29 DIAGNOSIS — Z87891 Personal history of nicotine dependence: Secondary | ICD-10-CM | POA: Diagnosis not present

## 2016-08-29 DIAGNOSIS — Z7982 Long term (current) use of aspirin: Secondary | ICD-10-CM | POA: Diagnosis not present

## 2016-08-29 DIAGNOSIS — Z833 Family history of diabetes mellitus: Secondary | ICD-10-CM | POA: Diagnosis not present

## 2016-08-29 DIAGNOSIS — Z79899 Other long term (current) drug therapy: Secondary | ICD-10-CM

## 2016-08-29 HISTORY — PX: TOTAL KNEE ARTHROPLASTY: SHX125

## 2016-08-29 SURGERY — ARTHROPLASTY, KNEE, TOTAL
Anesthesia: Spinal | Site: Knee | Laterality: Left

## 2016-08-29 MED ORDER — MENTHOL 3 MG MT LOZG: 1.0000 | LOZENGE | OROMUCOSAL | Status: DC | PRN

## 2016-08-29 MED ORDER — SODIUM CHLORIDE 0.9 % IR SOLN
Status: DC | PRN
  Administered 2016-08-29: 3000 mL

## 2016-08-29 MED ORDER — POLYETHYLENE GLYCOL 3350 17 G PO PACK: 17.0000 g | PACK | Freq: Every day | ORAL | Status: DC | PRN

## 2016-08-29 MED ORDER — FUROSEMIDE 20 MG PO TABS
20.0000 mg | ORAL_TABLET | Freq: Every day | ORAL | Status: DC
  Administered 2016-08-29 – 2016-08-31 (×3): 20 mg via ORAL
  Filled 2016-08-29 (×3): qty 1

## 2016-08-29 MED ORDER — PROPOFOL 10 MG/ML IV BOLUS
INTRAVENOUS | Status: AC
  Filled 2016-08-29: qty 20

## 2016-08-29 MED ORDER — ACETAMINOPHEN 325 MG PO TABS: 650.0000 mg | ORAL_TABLET | Freq: Four times a day (QID) | ORAL | Status: DC | PRN

## 2016-08-29 MED ORDER — AMLODIPINE BESY-BENAZEPRIL HCL 5-20 MG PO CAPS: 1.0000 | ORAL_CAPSULE | Freq: Every day | ORAL | Status: DC

## 2016-08-29 MED ORDER — PRAVASTATIN SODIUM 20 MG PO TABS
20.0000 mg | ORAL_TABLET | Freq: Every day | ORAL | Status: DC
  Administered 2016-08-29 – 2016-08-31 (×3): 20 mg via ORAL
  Filled 2016-08-29 (×3): qty 1

## 2016-08-29 MED ORDER — AMLODIPINE BESYLATE 5 MG PO TABS
5.0000 mg | ORAL_TABLET | Freq: Every day | ORAL | Status: DC
  Administered 2016-08-30 – 2016-08-31 (×2): 5 mg via ORAL
  Filled 2016-08-29 (×2): qty 1

## 2016-08-29 MED ORDER — ROPIVACAINE HCL 5 MG/ML IJ SOLN
INTRAMUSCULAR | Status: DC | PRN
  Administered 2016-08-29: 30 mL via PERINEURAL

## 2016-08-29 MED ORDER — METHOCARBAMOL 500 MG PO TABS
500.0000 mg | ORAL_TABLET | Freq: Four times a day (QID) | ORAL | Status: DC | PRN
  Administered 2016-08-29 – 2016-08-30 (×3): 500 mg via ORAL
  Filled 2016-08-29 (×3): qty 1

## 2016-08-29 MED ORDER — PROPOFOL 500 MG/50ML IV EMUL
INTRAVENOUS | Status: DC | PRN
  Administered 2016-08-29: 25 ug/kg/min via INTRAVENOUS
  Administered 2016-08-29: 50 ug/kg/min via INTRAVENOUS

## 2016-08-29 MED ORDER — MIDAZOLAM HCL 5 MG/5ML IJ SOLN
INTRAMUSCULAR | Status: DC | PRN
  Administered 2016-08-29 (×4): .5 mg via INTRAVENOUS

## 2016-08-29 MED ORDER — METOCLOPRAMIDE HCL 5 MG/ML IJ SOLN: 5.0000 mg | Freq: Three times a day (TID) | INTRAMUSCULAR | Status: DC | PRN

## 2016-08-29 MED ORDER — SODIUM CHLORIDE 0.9 % IV SOLN
INTRAVENOUS | Status: DC
  Administered 2016-08-29: via INTRAVENOUS

## 2016-08-29 MED ORDER — CEFAZOLIN SODIUM-DEXTROSE 1-4 GM/50ML-% IV SOLN
1.0000 g | Freq: Four times a day (QID) | INTRAVENOUS | Status: AC
  Administered 2016-08-29 – 2016-08-30 (×2): 1 g via INTRAVENOUS
  Filled 2016-08-29 (×2): qty 50

## 2016-08-29 MED ORDER — BENAZEPRIL HCL 20 MG PO TABS
20.0000 mg | ORAL_TABLET | Freq: Every day | ORAL | Status: DC
  Administered 2016-08-30 – 2016-08-31 (×2): 20 mg via ORAL
  Filled 2016-08-29 (×2): qty 1

## 2016-08-29 MED ORDER — FENTANYL CITRATE (PF) 100 MCG/2ML IJ SOLN
INTRAMUSCULAR | Status: AC
  Filled 2016-08-29: qty 2

## 2016-08-29 MED ORDER — HYDROMORPHONE HCL 1 MG/ML IJ SOLN
1.0000 mg | INTRAMUSCULAR | Status: DC | PRN
  Administered 2016-08-29: 1 mg via INTRAVENOUS
  Filled 2016-08-29 (×2): qty 1

## 2016-08-29 MED ORDER — ONDANSETRON HCL 4 MG/2ML IJ SOLN
INTRAMUSCULAR | Status: AC
  Filled 2016-08-29: qty 2

## 2016-08-29 MED ORDER — FENTANYL CITRATE (PF) 250 MCG/5ML IJ SOLN
INTRAMUSCULAR | Status: AC
  Filled 2016-08-29: qty 5

## 2016-08-29 MED ORDER — PANTOPRAZOLE SODIUM 40 MG PO TBEC
40.0000 mg | DELAYED_RELEASE_TABLET | Freq: Every day | ORAL | Status: DC
  Administered 2016-08-30 – 2016-08-31 (×2): 40 mg via ORAL
  Filled 2016-08-29 (×2): qty 1

## 2016-08-29 MED ORDER — CEFAZOLIN SODIUM-DEXTROSE 2-4 GM/100ML-% IV SOLN
2.0000 g | INTRAVENOUS | Status: AC
  Administered 2016-08-29: 2 g via INTRAVENOUS
  Filled 2016-08-29: qty 100

## 2016-08-29 MED ORDER — MIDAZOLAM HCL 2 MG/2ML IJ SOLN
INTRAMUSCULAR | Status: AC
  Filled 2016-08-29: qty 2

## 2016-08-29 MED ORDER — KETOROLAC TROMETHAMINE 15 MG/ML IJ SOLN
7.5000 mg | Freq: Four times a day (QID) | INTRAMUSCULAR | Status: AC
  Administered 2016-08-29 – 2016-08-30 (×4): 7.5 mg via INTRAVENOUS
  Filled 2016-08-29 (×4): qty 1

## 2016-08-29 MED ORDER — ONDANSETRON HCL 4 MG/2ML IJ SOLN
INTRAMUSCULAR | Status: DC | PRN
  Administered 2016-08-29: 4 mg via INTRAVENOUS

## 2016-08-29 MED ORDER — DEXTROSE 5 % IV SOLN: 500.0000 mg | Freq: Four times a day (QID) | INTRAVENOUS | Status: DC | PRN

## 2016-08-29 MED ORDER — PHENOL 1.4 % MT LIQD: 1.0000 | OROMUCOSAL | Status: DC | PRN

## 2016-08-29 MED ORDER — 0.9 % SODIUM CHLORIDE (POUR BTL) OPTIME
TOPICAL | Status: DC | PRN
  Administered 2016-08-29: 1000 mL

## 2016-08-29 MED ORDER — METOCLOPRAMIDE HCL 5 MG PO TABS: 5.0000 mg | ORAL_TABLET | Freq: Three times a day (TID) | ORAL | Status: DC | PRN

## 2016-08-29 MED ORDER — FENTANYL CITRATE (PF) 100 MCG/2ML IJ SOLN
50.0000 ug | Freq: Once | INTRAMUSCULAR | Status: AC
  Administered 2016-08-29: 50 ug via INTRAVENOUS

## 2016-08-29 MED ORDER — ONDANSETRON HCL 4 MG PO TABS: 4.0000 mg | ORAL_TABLET | Freq: Four times a day (QID) | ORAL | Status: DC | PRN

## 2016-08-29 MED ORDER — SODIUM CHLORIDE 0.9 % IV SOLN
INTRAVENOUS | Status: DC | PRN
  Administered 2016-08-29: 14:00:00 via INTRAVENOUS

## 2016-08-29 MED ORDER — ONDANSETRON HCL 4 MG/2ML IJ SOLN: 4.0000 mg | Freq: Four times a day (QID) | INTRAMUSCULAR | Status: DC | PRN

## 2016-08-29 MED ORDER — SODIUM CHLORIDE 0.9 % IV SOLN
INTRAVENOUS | Status: DC | PRN
  Administered 2016-08-29: 1000 mg via INTRAVENOUS

## 2016-08-29 MED ORDER — LACTATED RINGERS IV SOLN
INTRAVENOUS | Status: DC
  Administered 2016-08-29 (×2): via INTRAVENOUS

## 2016-08-29 MED ORDER — VITAMIN C 500 MG PO TABS: 1000.0000 mg | ORAL_TABLET | Freq: Every day | ORAL | Status: DC

## 2016-08-29 MED ORDER — OXYCODONE HCL 5 MG PO TABS
5.0000 mg | ORAL_TABLET | ORAL | Status: DC | PRN
Start: 2016-08-29 — End: 2016-08-31
  Administered 2016-08-29 – 2016-08-31 (×5): 10 mg via ORAL
  Filled 2016-08-29 (×5): qty 2

## 2016-08-29 MED ORDER — ALUM & MAG HYDROXIDE-SIMETH 200-200-20 MG/5ML PO SUSP: 30.0000 mL | ORAL | Status: DC | PRN

## 2016-08-29 MED ORDER — ACETAMINOPHEN 650 MG RE SUPP: 650.0000 mg | Freq: Four times a day (QID) | RECTAL | Status: DC | PRN

## 2016-08-29 MED ORDER — DOCUSATE SODIUM 100 MG PO CAPS
100.0000 mg | ORAL_CAPSULE | Freq: Two times a day (BID) | ORAL | Status: DC
  Administered 2016-08-29 – 2016-08-31 (×4): 100 mg via ORAL
  Filled 2016-08-29 (×4): qty 1

## 2016-08-29 MED ORDER — VITAMIN C 500 MG PO TABS
1000.0000 mg | ORAL_TABLET | Freq: Every day | ORAL | Status: DC
  Administered 2016-08-30 – 2016-08-31 (×2): 1000 mg via ORAL
  Filled 2016-08-29 (×2): qty 2

## 2016-08-29 MED ORDER — ASPIRIN EC 325 MG PO TBEC
325.0000 mg | DELAYED_RELEASE_TABLET | Freq: Two times a day (BID) | ORAL | Status: DC
  Administered 2016-08-29 – 2016-08-31 (×4): 325 mg via ORAL
  Filled 2016-08-29 (×4): qty 1

## 2016-08-29 MED ORDER — DEXAMETHASONE SODIUM PHOSPHATE 10 MG/ML IJ SOLN
INTRAMUSCULAR | Status: AC
  Filled 2016-08-29: qty 1

## 2016-08-29 MED ORDER — DIPHENHYDRAMINE HCL 12.5 MG/5ML PO ELIX: 12.5000 mg | ORAL_SOLUTION | ORAL | Status: DC | PRN

## 2016-08-29 MED ORDER — PHENYLEPHRINE HCL 10 MG/ML IJ SOLN
INTRAMUSCULAR | Status: DC | PRN
  Administered 2016-08-29: 40 ug via INTRAVENOUS
  Administered 2016-08-29: 80 ug via INTRAVENOUS
  Administered 2016-08-29: 40 ug via INTRAVENOUS

## 2016-08-29 MED ORDER — FENTANYL CITRATE (PF) 100 MCG/2ML IJ SOLN
INTRAMUSCULAR | Status: DC | PRN
  Administered 2016-08-29 (×3): 25 ug via INTRAVENOUS

## 2016-08-29 MED ORDER — MIDAZOLAM HCL 2 MG/2ML IJ SOLN
2.0000 mg | Freq: Once | INTRAMUSCULAR | Status: AC
  Administered 2016-08-29: 2 mg via INTRAVENOUS

## 2016-08-29 SURGICAL SUPPLY — 67 items
APL SKNCLS STERI-STRIP NONHPOA (GAUZE/BANDAGES/DRESSINGS) ×1
BANDAGE ACE 6X5 VEL STRL LF (GAUZE/BANDAGES/DRESSINGS) ×6 IMPLANT
BANDAGE ELASTIC 6 VELCRO ST LF (GAUZE/BANDAGES/DRESSINGS) ×2 IMPLANT
BANDAGE ESMARK 6X9 LF (GAUZE/BANDAGES/DRESSINGS) ×1 IMPLANT
BENZOIN TINCTURE PRP APPL 2/3 (GAUZE/BANDAGES/DRESSINGS) ×2 IMPLANT
BLADE 10 SAFETY STRL DISP (BLADE) ×4 IMPLANT
BLADE SAG 18X100X1.27 (BLADE) ×3 IMPLANT
BNDG CMPR 9X6 STRL LF SNTH (GAUZE/BANDAGES/DRESSINGS) ×1
BNDG ESMARK 6X9 LF (GAUZE/BANDAGES/DRESSINGS) ×3
BOWL SMART MIX CTS (DISPOSABLE) ×3 IMPLANT
CAPT KNEE TOTAL 3 ×2 IMPLANT
CEMENT BONE SIMPLEX SPEEDSET (Cement) ×6 IMPLANT
CLOSURE WOUND 1/2 X4 (GAUZE/BANDAGES/DRESSINGS) ×2
COVER SURGICAL LIGHT HANDLE (MISCELLANEOUS) ×3 IMPLANT
CUFF TOURNIQUET SINGLE 34IN LL (TOURNIQUET CUFF) ×3 IMPLANT
CUFF TOURNIQUET SINGLE 44IN (TOURNIQUET CUFF) IMPLANT
DRAPE EXTREMITY T 121X128X90 (DRAPE) ×3 IMPLANT
DRAPE HALF SHEET 40X57 (DRAPES) ×3 IMPLANT
DRAPE U-SHAPE 47X51 STRL (DRAPES) ×3 IMPLANT
DRSG PAD ABDOMINAL 8X10 ST (GAUZE/BANDAGES/DRESSINGS) ×3 IMPLANT
DURAPREP 26ML APPLICATOR (WOUND CARE) ×3 IMPLANT
ELECT CAUTERY BLADE 6.4 (BLADE) ×3 IMPLANT
ELECT REM PT RETURN 9FT ADLT (ELECTROSURGICAL) ×3
ELECTRODE REM PT RTRN 9FT ADLT (ELECTROSURGICAL) ×1 IMPLANT
FACESHIELD WRAPAROUND (MASK) ×6 IMPLANT
FACESHIELD WRAPAROUND OR TEAM (MASK) ×2 IMPLANT
GAUZE SPONGE 4X4 12PLY STRL (GAUZE/BANDAGES/DRESSINGS) ×3 IMPLANT
GAUZE SPONGE 4X4 12PLY STRL LF (GAUZE/BANDAGES/DRESSINGS) ×2 IMPLANT
GAUZE XEROFORM 1X8 LF (GAUZE/BANDAGES/DRESSINGS) ×3 IMPLANT
GLOVE BIOGEL PI IND STRL 8 (GLOVE) ×2 IMPLANT
GLOVE BIOGEL PI INDICATOR 8 (GLOVE) ×4
GLOVE ORTHO TXT STRL SZ7.5 (GLOVE) ×3 IMPLANT
GLOVE SURG ORTHO 8.0 STRL STRW (GLOVE) ×3 IMPLANT
GOWN STRL REUS W/ TWL LRG LVL3 (GOWN DISPOSABLE) IMPLANT
GOWN STRL REUS W/ TWL XL LVL3 (GOWN DISPOSABLE) ×2 IMPLANT
GOWN STRL REUS W/TWL LRG LVL3 (GOWN DISPOSABLE)
GOWN STRL REUS W/TWL XL LVL3 (GOWN DISPOSABLE) ×6
HANDPIECE INTERPULSE COAX TIP (DISPOSABLE) ×3
IMMOBILIZER KNEE 22 UNIV (SOFTGOODS) ×3 IMPLANT
KIT BASIN OR (CUSTOM PROCEDURE TRAY) ×3 IMPLANT
KIT ROOM TURNOVER OR (KITS) ×3 IMPLANT
MANIFOLD NEPTUNE II (INSTRUMENTS) ×3 IMPLANT
NDL SAFETY ECLIPSE 18X1.5 (NEEDLE) IMPLANT
NEEDLE HYPO 18GX1.5 SHARP (NEEDLE)
NS IRRIG 1000ML POUR BTL (IV SOLUTION) ×3 IMPLANT
PACK TOTAL JOINT (CUSTOM PROCEDURE TRAY) ×3 IMPLANT
PAD ABD 8X10 STRL (GAUZE/BANDAGES/DRESSINGS) ×2 IMPLANT
PAD ARMBOARD 7.5X6 YLW CONV (MISCELLANEOUS) ×3 IMPLANT
PADDING CAST COTTON 6X4 STRL (CAST SUPPLIES) ×3 IMPLANT
SET HNDPC FAN SPRY TIP SCT (DISPOSABLE) ×1 IMPLANT
SET PAD KNEE POSITIONER (MISCELLANEOUS) ×3 IMPLANT
STAPLER VISISTAT 35W (STAPLE) IMPLANT
STRIP CLOSURE SKIN 1/2X4 (GAUZE/BANDAGES/DRESSINGS) ×2 IMPLANT
SUCTION FRAZIER HANDLE 10FR (MISCELLANEOUS) ×2
SUCTION TUBE FRAZIER 10FR DISP (MISCELLANEOUS) ×1 IMPLANT
SUT MNCRL AB 4-0 PS2 18 (SUTURE) IMPLANT
SUT VIC AB 0 CT1 27 (SUTURE) ×3
SUT VIC AB 0 CT1 27XBRD ANBCTR (SUTURE) ×1 IMPLANT
SUT VIC AB 1 CT1 27 (SUTURE) ×9
SUT VIC AB 1 CT1 27XBRD ANBCTR (SUTURE) ×2 IMPLANT
SUT VIC AB 2-0 CT1 27 (SUTURE) ×9
SUT VIC AB 2-0 CT1 TAPERPNT 27 (SUTURE) ×2 IMPLANT
SYR 50ML LL SCALE MARK (SYRINGE) IMPLANT
TOWEL OR 17X24 6PK STRL BLUE (TOWEL DISPOSABLE) ×3 IMPLANT
TOWEL OR 17X26 10 PK STRL BLUE (TOWEL DISPOSABLE) ×3 IMPLANT
TRAY CATH 16FR W/PLASTIC CATH (SET/KITS/TRAYS/PACK) IMPLANT
WRAP KNEE MAXI GEL POST OP (GAUZE/BANDAGES/DRESSINGS) ×3 IMPLANT

## 2016-08-29 NOTE — Anesthesia Procedure Notes (Signed)
Procedure Name: MAC Date/Time: 08/29/2016 12:56 PM Performed by: Scheryl Darter Pre-anesthesia Checklist: Patient identified, Emergency Drugs available, Suction available, Patient being monitored and Timeout performed Patient Re-evaluated:Patient Re-evaluated prior to induction Oxygen Delivery Method: Nasal cannula Placement Confirmation: positive ETCO2

## 2016-08-29 NOTE — Transfer of Care (Signed)
Immediate Anesthesia Transfer of Care Note  Patient: Julie Dougherty  Procedure(s) Performed: Procedure(s): LEFT TOTAL KNEE ARTHROPLASTY (Left)  Patient Location: PACU  Anesthesia Type:Spinal  Level of Consciousness: awake  Airway & Oxygen Therapy: Patient Spontanous Breathing  Post-op Assessment: Report given to RN and Post -op Vital signs reviewed and stable  Post vital signs: Reviewed and stable  Last Vitals:  Vitals:   08/29/16 1145 08/29/16 1150  BP: 109/78 119/75  Pulse: 86 81  Resp: (!) 23 20  Temp:      Last Pain:  Vitals:   08/29/16 1025  TempSrc: Oral         Complications: No apparent anesthesia complications

## 2016-08-29 NOTE — Anesthesia Preprocedure Evaluation (Addendum)
Anesthesia Evaluation  Patient identified by MRN, date of birth, ID band Patient awake    Reviewed: Allergy & Precautions, NPO status , Patient's Chart, lab work & pertinent test results  Airway Mallampati: III  TM Distance: >3 FB Neck ROM: Full    Dental  (+) Teeth Intact, Dental Advisory Given   Pulmonary sleep apnea , former smoker,    breath sounds clear to auscultation- rhonchi       Cardiovascular hypertension, Pt. on medications negative cardio ROS   Rhythm:Regular Rate:Normal     Neuro/Psych  Neuromuscular disease    GI/Hepatic hiatal hernia, PUD, GERD  Medicated,(+) Hepatitis -  Endo/Other  negative endocrine ROS  Renal/GU negative Renal ROS     Musculoskeletal negative musculoskeletal ROS (+) Arthritis ,   Abdominal   Peds  Hematology negative hematology ROS (+)   Anesthesia Other Findings Day of surgery medications reviewed with the patient.  Reproductive/Obstetrics                            Lab Results  Component Value Date   WBC 6.2 08/22/2016   HGB 14.7 08/22/2016   HCT 46.2 (H) 08/22/2016   MCV 88.5 08/22/2016   PLT 231 08/22/2016     Anesthesia Physical Anesthesia Plan  ASA: III  Anesthesia Plan: Spinal   Post-op Pain Management:  Regional for Post-op pain   Induction:   PONV Risk Score and Plan: 3 and Ondansetron, Dexamethasone, Midazolam and Propofol infusion  Airway Management Planned: Nasal Cannula  Additional Equipment:   Intra-op Plan:   Post-operative Plan:   Informed Consent: I have reviewed the patients History and Physical, chart, labs and discussed the procedure including the risks, benefits and alternatives for the proposed anesthesia with the patient or authorized representative who has indicated his/her understanding and acceptance.     Plan Discussed with: CRNA  Anesthesia Plan Comments:        Anesthesia Quick Evaluation

## 2016-08-29 NOTE — Op Note (Signed)
NAME:  Julie Dougherty, Julie Dougherty   ACCOUNT NO.:  [id number]  MEDICAL RECORD NO.:  [id number]  LOCATION:  [facility]                         FACILITY:  [facility]  PHYSICIAN:  Lind Guest. Ninfa Linden, M.D.DATE OF BIRTH:  1950/11/25  DATE OF PROCEDURE:  08/29/2016 DATE OF DISCHARGE:                              OPERATIVE REPORT   PREOPERATIVE DIAGNOSIS:  Left knee severe arthritis and degenerative joint disease.  POSTOPERATIVE DIAGNOSIS:  Left knee severe arthritis and degenerative joint disease.  PROCEDURE:  Left total knee arthroplasty.  IMPLANTS:  Stryker Triathlon knee with size 3 femur, size 3 tibial tray, 11-mm fix-bearing polyethylene insert, size 32 patellar button.  SURGEON:  Lind Guest. Ninfa Linden, M.D.  ASSISTANT:  Erskine Emery, PA-C.  ANESTHESIA: 1. Left lower extremity regional adductor canal block. 2. Spinal.  TOURNIQUET TIME:  Under 1 hour.  ANTIBIOTICS:  2 g of IV Ancef.  BLOOD LOSS:  Less than 200 mL.  COMPLICATIONS:  None.  INDICATIONS:  The patient is a very pleasant 66 year old, non-English- speaking Hispanic female with debilitating arthritis involving her left knee.  Other x-rays, the arthritis does not look severe on plain films. We did obtain an MRI of her knee.  We found a large joint effusion, significant edema in the medial femoral condyle and medial tibial plateau, and there was an extensive full-thickness cartilage loss on the medial compartment of the knee as well as the patellofemoral joint. With those findings and the failure of conservative treatment, an arthroscopic intervention would not be warranted.  We talked to her and her family in length and they agreed to proceed with the total knee arthroplasty given her daily pain, being at 10/10.  Also, she has had a detrimental effect on her activities of daily living, her quality of life, and her mobility.  She understands the risk of acute blood loss anemia, nerve and vessel injury, fracture,  infection, and DVT.  She understands our goals are to decrease pain, improve mobility, and overall improved quality of life.  PROCEDURE DESCRIPTION:  After informed consent was obtained, appropriate left knee was marked.  She was brought to the operating room after an adductor canal block was obtained in the holding room.  In the operating room, spinal anesthesia was obtained.  She was placed supine on the operating table.  A nonsterile tourniquet was placed around her upper left thigh and her left leg was prepped and draped from the thigh down the toes with DuraPrep and sterile drapes.  Time-out was called and she was identified as correct patient and correct left knee.  We then used an Esmarch to wrap out the leg and tourniquet was inflated to 300 mm of pressure.  I then made a direct midline incision of the patella and carried this proximally and distally.  We dissected down the knee joint and carried out a medial parapatellar arthrotomy finding a very large joint effusion and significant synovitis throughout the knee.  We found areas of full-thickness cartilage loss throughout the knee.  We removed remnants of the ACL, PCL, medial and lateral meniscus, and with the knee in a flexed position, we set our extramedullary cutting guide for the tibia taking 9 mm off the high side correcting for varus and valgus, and neutral slope.  We made the tibial cut without difficulty.  We then went to the femur and used the intramedullary guide through the notch to make our distal femoral cut.  We set our distal femoral cutting block to take 8 mm off the distal femur setting the cutting guide for left knee at 5 degrees externally rotated.  We made our distal femoral cut without difficulty and brought the knee back down to full extension with a 9-mm extension block, we achieved full extension.  We then went back to the femur and put our femoral sizing guide based off the epicondylar axis. We chose a  size 3 femur and we put a 4-in-1 cutting block for 3 femur. We made our anterior and posterior cuts followed by our chamfer cuts and then our femoral box cut.  With the knee in 90 degrees, we placed a 9-mm extension block and we had achieved our flexion and extension gap balancing as well.  I did feel that we should go with an 11-mm poly and the trial correlated with a gap balancing.  We then went to the tibia and made our size 3 tibial tray, we made a keel cut off this.  With the size 3 trial tibia and the size 3 trial femur, we trialed an 11-mm fix- bearing polyethylene insert and we were pleased with range of motion and stability.  We then made our patellar cut and drilled 3 holes for size 32 patellar button.  We then removed all trial components and irrigated the knee with normal saline solution using pulsatile lavage.  We were then able to mixed our cement and cemented the real Stryker Triathlon tibial tray size 3, followed by the real size 3 femur.  We cleaned the cement debris from the knee and placed our 11-mm fix-bearing polyethylene insert for a size 3 tibial tray.  We then cemented our patellar button as well.  Once the cement had hardened, hemostasis was obtained with electrocautery.  Once we let the tourniquet down, we irrigated the knee again with normal saline solution and then we closed our arthrotomy with interrupted #1 Vicryl suture followed by 0 Vicryl in the deep tissue, 2-0 Vicryl in the subcutaneous tissue, 4-0 Monocryl subcuticular stitch and Steri-Strips on the skin.  Well-padded sterile dressing was applied.  She was taken to the recovery room in stable condition.  All final counts were correct.  There were no complications noted.  Of note, Erskine Emery, PA-C assisted in the entire case, his assistance was crucial for facilitating all aspects of this case.     Lind Guest. Ninfa Linden, M.D.     CYB/MEDQ  D:  08/29/2016  T:  08/29/2016  Job:  [id number]

## 2016-08-29 NOTE — Anesthesia Procedure Notes (Addendum)
Spinal  Patient location during procedure: OR Start time: 08/29/2016 1:07 PM End time: 08/29/2016 1:10 PM Staffing Anesthesiologist: Suella Broad D Performed: anesthesiologist  Preanesthetic Checklist Completed: patient identified, site marked, surgical consent, pre-op evaluation, timeout performed, IV checked, risks and benefits discussed and monitors and equipment checked Spinal Block Patient position: sitting Prep: Betadine Patient monitoring: heart rate, continuous pulse ox, blood pressure and cardiac monitor Approach: midline Location: L4-5 Injection technique: single-shot Needle Needle type: Introducer and Pencan  Needle gauge: 24 G Needle length: 9 cm Additional Notes Negative paresthesia. Negative blood return. Positive free-flowing CSF. Expiration date of kit checked and confirmed. Patient tolerated procedure well, without complications.

## 2016-08-29 NOTE — Brief Op Note (Signed)
08/29/2016  2:38 PM  PATIENT:  Julie Dougherty  66 y.o. female  PRE-OPERATIVE DIAGNOSIS:  osteoarthritis left knee  POST-OPERATIVE DIAGNOSIS:  osteoarthritis left knee  PROCEDURE:  Procedure(s): LEFT TOTAL KNEE ARTHROPLASTY (Left)  SURGEON:  Surgeon(s) and Role:    Mcarthur Rossetti, MD - Primary  PHYSICIAN ASSISTANT: Benita Stabile, PA-C  ANESTHESIA:   regional and spinal  EBL:  Total I/O In: 1000 [I.V.:1000] Out: -   COUNTS:  YES  TOURNIQUET:   Total Tourniquet Time Documented: Thigh (Left) - 52 minutes Total: Thigh (Left) - 52 minutes   DICTATION: .Other Dictation: Dictation Number [id number]  PLAN OF CARE: Admit to inpatient   PATIENT DISPOSITION:  PACU - hemodynamically stable.   Delay start of Pharmacological VTE agent (>24hrs) due to surgical blood loss or risk of bleeding: no

## 2016-08-29 NOTE — Anesthesia Procedure Notes (Signed)
Anesthesia Regional Block: Adductor canal block   Pre-Anesthetic Checklist: ,, timeout performed, Correct Patient, Correct Site, Correct Laterality, Correct Procedure, Correct Position, site marked, Risks and benefits discussed,  Surgical consent,  Pre-op evaluation,  At surgeon's request and post-op pain management  Laterality: Left  Prep: chloraprep       Needles:  Injection technique: Single-shot  Needle Type: Echogenic Needle     Needle Length: 9cm  Needle Gauge: 21     Additional Needles:   Procedures: ultrasound guided,,,,,,,,  Narrative:  Start time: 08/29/2016 11:45 AM End time: 08/29/2016 11:50 AM Injection made incrementally with aspirations every 5 mL.  Performed by: Personally  Anesthesiologist: Suella Broad D  Additional Notes: Tolerated well. No issues.

## 2016-08-29 NOTE — Anesthesia Postprocedure Evaluation (Signed)
Anesthesia Post Note  Patient: Julie Dougherty  Procedure(s) Performed: Procedure(s) (LRB): LEFT TOTAL KNEE ARTHROPLASTY (Left)     Patient location during evaluation: PACU Anesthesia Type: Spinal Level of consciousness: oriented and awake and alert Pain management: pain level controlled Vital Signs Assessment: post-procedure vital signs reviewed and stable Respiratory status: spontaneous breathing, respiratory function stable and patient connected to nasal cannula oxygen Cardiovascular status: blood pressure returned to baseline and stable Postop Assessment: no headache, no backache, spinal receding and patient able to bend at knees Anesthetic complications: no    Last Vitals:  Vitals:   08/29/16 1630 08/29/16 1700  BP: 115/84 117/77  Pulse: (!) 56 66  Resp: 16 (!) 25  Temp: (!) 36.2 C                  Effie Berkshire

## 2016-08-29 NOTE — H&P (Signed)
TOTAL KNEE ADMISSION H&P  Patient is being admitted for left total knee arthroplasty.  Subjective:  Chief Complaint:left knee pain.  HPI: Julie Dougherty, 66 y.o. female, has a history of pain and functional disability in the left knee due to arthritis and has failed non-surgical conservative treatments for greater than 12 weeks to includeNSAID's and/or analgesics, corticosteriod injections, use of assistive devices and activity modification.  Onset of symptoms was abrupt, starting 1 years ago with rapidlly worsening course since that time. The patient noted no past surgery on the left knee(s).  Patient currently rates pain in the left knee(s) at 10 out of 10 with activity. Patient has night pain, worsening of pain with activity and weight bearing, pain that interferes with activities of daily living, pain with passive range of motion, crepitus and joint swelling.  Patient has evidence of subchondral cysts, subchondral sclerosis, periarticular osteophytes and joint space narrowing by imaging studies. There is no active infection.  Patient Active Problem List   Diagnosis Date Noted  . Unilateral primary osteoarthritis, left knee 07/17/2016  . Acute pain of left knee 06/28/2016  . Baker's cyst of knee, left - suspected 05/30/2016  . Diverticulitis of colon 03/17/2016  . Hemorrhoids, internal, with bleeding Gr 2 prolapsed 06/30/2011  . Diverticulitis 06/06/2011  . Rectal bleeding 04/19/2009  . ABDOMINAL PAIN, RIGHT LOWER QUADRANT 04/19/2009  . HYPERLIPIDEMIA 07/11/2006  . HYPERTENSION, ESSENTIAL NOS 07/11/2006  . GERD 07/11/2006  . HEPATITIS B, HX OF 07/11/2006   Past Medical History:  Diagnosis Date  . Arthritis   . Cataract   . Chronic constipation   . Diverticulitis   . Diverticulosis   . Fatty liver   . GERD (gastroesophageal reflux disease)   . Hemorrhoids, internal, with bleeding Gr 2 prolapsed 06/30/2011   Found on colonoscopy 2011 and anoscopy May 2013   .  Hepatitis    "B"    no rx >10 yrs  . Hiatal hernia   . High cholesterol   . Hypertension   . Internal hemorrhoids   . OSA (obstructive sleep apnea)   . PUD (peptic ulcer disease)   . Rectal ulceration 04/27/2009   Associated reactive/regenerative changes and fibromuscular extensions into lamina propria/Mucosal Prolapse  . Sleep apnea    Waiting on CPAP machine    Past Surgical History:  Procedure Laterality Date  . ABDOMINAL HYSTERECTOMY    . COLONOSCOPY  04/27/09    POLYPOID FRAGMENT OF COLONIC MUCOSA WITH SURFACE  . HEMORRHOID BANDING    . TUBAL LIGATION      Facility-Administered Medications Prior to Admission  Medication Dose Route Frequency Provider Last Rate Last Dose  . 0.9 %  sodium chloride infusion  500 mL Intravenous Continuous [name], MD       Prescriptions Prior to Admission  Medication Sig Dispense Refill Last Dose  . amLODipine-benazepril (LOTREL) 5-20 MG capsule Take 1 capsule by mouth daily.   08/28/2016 at Unknown time  . Ascorbic Acid (VITAMIN C) 1000 MG tablet Take 1,000 mg by mouth daily.   Past Week at Unknown time  . aspirin EC 81 MG tablet Take 81 mg by mouth daily.   Past Week at Unknown time  . furosemide (LASIX) 20 MG tablet Take 20 mg by mouth daily.   08/28/2016 at Unknown time  . omega-3 fish oil (MAXEPA) 1000 MG CAPS capsule Take 1 mg by mouth.   Past Week at Unknown time  . pantoprazole (PROTONIX) 40 MG tablet Take 40  mg by mouth daily.   08/29/2016 at 0900  . polyethylene glycol powder (MIRALAX) powder Take 17 g by mouth daily. (Patient taking differently: Take 17 g by mouth daily as needed for moderate constipation. ) 255 g 11 Past Week at Unknown time  . pravastatin (PRAVACHOL) 20 MG tablet Take 20 mg by mouth daily.   08/28/2016 at Unknown time   Allergies  Allergen Reactions  . Ciprofloxacin Hives    nausea  . Flagyl [Metronidazole] Hives    nausea    Social History  Substance Use Topics  . Smoking status: Former Smoker    Quit  date: 08/22/1996  . Smokeless tobacco: Never Used     Comment: "when she was young"  . Alcohol use No    Family History  Problem Relation Age of Onset  . Diabetes Sister   . Colon cancer Neg Hx      Review of Systems  Musculoskeletal: Positive for joint pain.  All other systems reviewed and are negative.   Objective:  Physical Exam  Constitutional: She is oriented to person, place, and time. She appears well-developed and well-nourished.  HENT:  Head: Normocephalic and atraumatic.  Eyes: Pupils are equal, round, and reactive to light. EOM are normal.  Neck: Normal range of motion. Neck supple.  Cardiovascular: Normal rate and regular rhythm.   Respiratory: Effort normal and breath sounds normal.  GI: Soft. Bowel sounds are normal.  Musculoskeletal:       Left knee: She exhibits decreased range of motion, swelling, effusion and abnormal alignment. Tenderness found. Medial joint line and lateral joint line tenderness noted.  Neurological: She is alert and oriented to person, place, and time.  Skin: Skin is warm and dry.  Psychiatric: She has a normal mood and affect.    Vital signs in last 24 hours: Temp:  [97.6 F (36.4 C)] 97.6 F (36.4 C) (07/31 1025) Pulse Rate:  [81-89] 81 (07/31 1150) Resp:  [17-24] 20 (07/31 1150) BP: (109-129)/(70-85) 119/75 (07/31 1150) SpO2:  [98 %-100 %] 99 % (07/31 1150) Weight:  [196 lb (88.9 kg)] 196 lb (88.9 kg) (07/31 1119)  Labs:   Estimated body mass index is 38.28 kg/m as calculated from the following:   Height as of this encounter: 5' (1.524 m).   Weight as of this encounter: 196 lb (88.9 kg).   Imaging Review Plain radiographs demonstrate severe degenerative joint disease of the left knee(s). The overall alignment ismild varus. The bone quality appears to be excellent for age and reported activity level.  Assessment/Plan:  End stage arthritis, left knee   The patient history, physical examination, clinical judgment of the  provider and imaging studies are consistent with end stage degenerative joint disease of the left knee(s) and total knee arthroplasty is deemed medically necessary. The treatment options including medical management, injection therapy arthroscopy and arthroplasty were discussed at length. The risks and benefits of total knee arthroplasty were presented and reviewed. The risks due to aseptic loosening, infection, stiffness, patella tracking problems, thromboembolic complications and other imponderables were discussed. The patient acknowledged the explanation, agreed to proceed with the plan and consent was signed. Patient is being admitted for inpatient treatment for surgery, pain control, PT, OT, prophylactic antibiotics, VTE prophylaxis, progressive ambulation and ADL's and discharge planning. The patient is planning to be discharged home with home health services

## 2016-08-30 ENCOUNTER — Encounter (HOSPITAL_COMMUNITY): Payer: Self-pay | Admitting: Orthopaedic Surgery

## 2016-08-30 LAB — CBC
HEMATOCRIT: 35.3 % — AB (ref 36.0–46.0)
Hemoglobin: 11.7 g/dL — ABNORMAL LOW (ref 12.0–15.0)
MCH: 28.3 pg (ref 26.0–34.0)
MCHC: 33.1 g/dL (ref 30.0–36.0)
MCV: 85.3 fL (ref 78.0–100.0)
PLATELETS: 255 10*3/uL (ref 150–400)
RBC: 4.14 MIL/uL (ref 3.87–5.11)
RDW: 13.1 % (ref 11.5–15.5)
WBC: 13.1 10*3/uL — AB (ref 4.0–10.5)

## 2016-08-30 LAB — BASIC METABOLIC PANEL
ANION GAP: 9 (ref 5–15)
BUN: 14 mg/dL (ref 6–20)
CALCIUM: 8.6 mg/dL — AB (ref 8.9–10.3)
CO2: 23 mmol/L (ref 22–32)
CREATININE: 1.06 mg/dL — AB (ref 0.44–1.00)
Chloride: 102 mmol/L (ref 101–111)
GFR, EST NON AFRICAN AMERICAN: 54 mL/min — AB (ref >60)
GLUCOSE: 136 mg/dL — AB (ref 65–99)
Potassium: 4.3 mmol/L (ref 3.5–5.1)
Sodium: 134 mmol/L — ABNORMAL LOW (ref 135–145)

## 2016-08-30 NOTE — Progress Notes (Signed)
Subjective: 1 Day Post-Op Procedure(s) (LRB): LEFT TOTAL KNEE ARTHROPLASTY (Left) Patient reports pain as moderate.    Objective: Vital signs in last 24 hours: Temp:  [96.6 F (35.9 C)-97.9 F (36.6 C)] 97.8 F (36.6 C) (08/01 0530) Pulse Rate:  [54-114] 100 (08/01 0530) Resp:  [14-25] 18 (08/01 0530) BP: (95-132)/(70-91) 122/85 (08/01 0530) SpO2:  [97 %-100 %] 97 % (08/01 0530) Weight:  [196 lb (88.9 kg)] 196 lb (88.9 kg) (07/31 1119)  Intake/Output from previous day: 07/31 0701 - 08/01 0700 In: 1670 [P.O.:120; I.V.:1550] Out: 100 [Urine:100] Intake/Output this shift: No intake/output data recorded.   Recent Labs  08/30/16 0410  HGB 11.7*    Recent Labs  08/30/16 0410  WBC 13.1*  RBC 4.14  HCT 35.3*  PLT 255    Recent Labs  08/30/16 0410  NA 134*  K 4.3  CL 102  CO2 23  BUN 14  CREATININE 1.06*  GLUCOSE 136*  CALCIUM 8.6*   No results for input(s): LABPT, INR in the last 72 hours.  Sensation intact distally Intact pulses distally Dorsiflexion/Plantar flexion intact Incision: dressing C/D/I Compartment soft  Assessment/Plan: 1 Day Post-Op Procedure(s) (LRB): LEFT TOTAL KNEE ARTHROPLASTY (Left) Up with therapy  Mcarthur Rossetti 08/30/2016, 6:58 AM

## 2016-08-30 NOTE — Plan of Care (Signed)
Problem: Education: Goal: Knowledge of [facility] Education information/materials will improve Outcome: Progressing POC reviewed with pt. and family- pt. speaks little [place]; her children speak [place].

## 2016-08-30 NOTE — Evaluation (Signed)
Occupational Therapy Evaluation Patient Details Name: Julie Dougherty MRN: [id number] DOB: 1950/10/01 Today's Date: 08/30/2016    History of Present Illness Pt is a 67 yo female admitted on 08/29/16 for L TKA. PMH significant for diverticulitis, rectal bleed, HLD, HTN, GERD.    Clinical Impression   Pt with decline in function and safety with ADLs and ADL mobility with decreased strength, balance and endurance. Pt will benefit from acute OT services to address impairments to increase level of function and safety    Follow Up Recommendations  DC plan and follow up therapy as arranged by surgeon    Equipment Recommendations  None recommended by OT    Recommendations for Other Services       Precautions / Restrictions Precautions Precautions: Knee Precaution Booklet Issued: Yes (comment) Precaution Comments: reviewed no pillow under knee Required Braces or Orthoses: Knee Immobilizer - Left Knee Immobilizer - Left: Other (comment) (in room, no order) Restrictions Weight Bearing Restrictions: Yes LLE Weight Bearing: Weight bearing as tolerated      Mobility Bed Mobility Overal bed mobility: Needs Assistance Bed Mobility: Supine to Sit     Supine to sit: Min assist     General bed mobility comments: pt up in recliner upon arrival  Transfers Overall transfer level: Needs assistance Equipment used: Rolling walker (2 wheeled) Transfers: Sit to/from Stand Sit to Stand: Min guard         General transfer comment: Min gaurd for safety from EOB. Cues for hand placement    Balance Overall balance assessment: Needs assistance Sitting-balance support: No upper extremity supported;Feet supported Sitting balance-Leahy Scale: Good     Standing balance support: Bilateral upper extremity supported;During functional activity Standing balance-Leahy Scale: Poor Standing balance comment: reliant on RW for stability in standing                           ADL  either performed or assessed with clinical judgement   ADL Overall ADL's : Needs assistance/impaired     Grooming: Wash/dry hands;Wash/dry face;Standing;Min guard   Upper Body Bathing: Set up   Lower Body Bathing: With caregiver independent assisting;Minimal assistance   Upper Body Dressing : Set up   Lower Body Dressing: With caregiver independent assisting;Minimal assistance   Toilet Transfer: Min guard;RW;Ambulation;Comfort height toilet;With caregiver independent assisting   Toileting- Water quality scientist and Hygiene: Min guard;Sit to/from stand   Tub/ Banker: Min guard;3 in [employer] walker   Functional mobility during ADLs: Min guard;Cueing for safety;Caregiver able to provide necessary level of assistance;Rolling walker General ADL Comments: Pt and family educated on ADL A/E for home use     Vision Baseline Vision/History: Wears glasses Wears Glasses: Reading only Patient Visual Report: No change from baseline                  Pertinent Vitals/Pain Pain Assessment: 0-10 Pain Score: 3  Pain Location: L knee Pain Descriptors / Indicators: Grimacing;Guarding;Sore;Aching Pain Intervention(s): Monitored during session;Premedicated before session;Repositioned;Ice applied     Hand Dominance Right   Extremity/Trunk Assessment Upper Extremity Assessment Upper Extremity Assessment: Generalized weakness   Lower Extremity Assessment Lower Extremity Assessment: Defer to PT evaluation LLE Deficits / Details: pt with post op pain and weakness. At least 3/5 ankle and 2/5 knee and hip per gross functional assessment   Cervical / Trunk Assessment Cervical / Trunk Assessment: Normal   Communication Communication Communication: Prefers language other than English (Speaks [place], family interprets)  Cognition Arousal/Alertness: Awake/alert Behavior During Therapy: WFL for tasks assessed/performed Overall Cognitive Status: Within Functional Limits for tasks  assessed                                     General Comments  Family willing to translate              Home Living Family/patient expects to be discharged to:: Private residence Living Arrangements: Children Available Help at Discharge: Family;Available 24 hours/day Type of Home: House Home Access: Stairs to enter [employer] of Steps: 2   Home Layout: One level     Bathroom Shower/Tub: Corporate investment banker: Standard     Home Equipment: Wheelchair - manual   Additional Comments: 3 in 1 and RW have been delivered to pt's room      Prior Functioning/Environment Level of Independence: Independent        Comments: completely independent and doing everything for herself prior to surgery        OT Problem List: Decreased strength;Impaired balance (sitting and/or standing);Pain;Decreased activity tolerance;Decreased knowledge of use of DME or AE      OT Treatment/Interventions: Self-care/ADL training;DME and/or AE instruction;Therapeutic activities;Patient/family education    OT Goals(Current goals can be found in the care plan section) Acute Rehab OT Goals Patient Stated Goal: to have less pain OT Goal Formulation: With patient/family Time For Goal Achievement: 09/06/16 Potential to Achieve Goals: Good ADL Goals Pt Will Perform Grooming: with supervision;with set-up;with caregiver independent in assisting;standing Pt Will Perform Lower Body Bathing: with min guard assist;with caregiver independent in assisting;sitting/lateral leans;sit to/from stand Pt Will Perform Lower Body Dressing: with min guard assist;with caregiver independent in assisting;sitting/lateral leans;sit to/from stand Pt Will Transfer to Toilet: with supervision;ambulating Pt Will Perform Toileting - Clothing Manipulation and hygiene: with supervision;sit to/from stand;with caregiver independent in assisting Pt Will Perform Tub/Shower Transfer: with  supervision;3 in 1;rolling walker;with caregiver independent in assisting  OT Frequency: Min 2X/week   Barriers to D/C:    no barriers                     AM-PAC PT "6 Clicks" Daily Activity     Outcome Measure Help from another person eating meals?: None Help from another person taking care of personal grooming?: A Little Help from another person toileting, which includes using toliet, bedpan, or urinal?: A Little Help from another person bathing (including washing, rinsing, drying)?: A Little Help from another person to put on and taking off regular upper body clothing?: A Little Help from another person to put on and taking off regular lower body clothing?: A Little 6 Click Score: 19   End of Session Equipment Utilized During Treatment: Gait belt;Rolling walker;Other (comment) (3 in 1)  Activity Tolerance: Patient tolerated treatment well Patient left: in chair;with call bell/phone within reach  OT Visit Diagnosis: Unsteadiness on feet (R26.81);Pain;Other abnormalities of gait and mobility (R26.89) Pain - Right/Left: Left Pain - part of body: Knee                Time: 2353-6144 OT Time Calculation (min): 24 min Charges:  OT General Charges $OT Visit: 1 Procedure OT Evaluation $OT Eval Moderate Complexity: 1 Procedure OT Treatments $Therapeutic Activity: 8-22 mins G-Codes: OT G-codes **NOT FOR INPATIENT CLASS** Functional Assessment Tool Used: AM-PAC 6 Clicks Daily Activity     Britt Bottom 08/30/2016, 1:07 PM

## 2016-08-30 NOTE — Progress Notes (Signed)
Physical Therapy Treatment Patient Details Name: Julie Dougherty MRN: [id number] DOB: 07-11-1950 Today's Date: 08/30/2016    History of Present Illness Pt is a 66 yo female admitted on 08/29/16 for L TKA. PMH significant for diverticulitis, rectal bleed, HLD, HTN, GERD.     PT Comments    Pt presents with the improved tolerance for gait and exercises this session. Pt reports mild pain with mobility and only pain with weight bearing through LLE. Pt is very motivated and will perform all exercises requested with good adherence to HEP.     Follow Up Recommendations  DC plan and follow up therapy as arranged by surgeon     Equipment Recommendations  Rolling walker with 5" wheels;3in1 (PT)    Recommendations for Other Services       Precautions / Restrictions Precautions Precautions: Knee Precaution Booklet Issued: Yes (comment) Precaution Comments: reviewed no pillow under knee Required Braces or Orthoses: Knee Immobilizer - Left Knee Immobilizer - Left: Other (comment) (in room, no order) Restrictions Weight Bearing Restrictions: Yes LLE Weight Bearing: Weight bearing as tolerated    Mobility  Bed Mobility               General bed mobility comments: pt up in recliner upon arrival  Transfers Overall transfer level: Needs assistance Equipment used: Rolling walker (2 wheeled) Transfers: Sit to/from Stand Sit to Stand: Supervision         General transfer comment: supervision this session from recliner  Ambulation/Gait Ambulation/Gait assistance: Min guard;Supervision Ambulation Distance (Feet): 350 Feet Assistive device: Rolling walker (2 wheeled) Gait Pattern/deviations: Step-through pattern;Decreased step length - right;Decreased stance time - left;Antalgic Gait velocity: decreased Gait velocity interpretation: Below normal speed for age/gender General Gait Details: Moderate antalgic gait with cues for sequencing and proximity to Julie Dougherty    Modified Rankin (Stroke Patients Only)       Balance Overall balance assessment: Needs assistance Sitting-balance support: No upper extremity supported;Feet supported Sitting balance-Leahy Scale: Good     Standing balance support: Bilateral upper extremity supported;During functional activity Standing balance-Leahy Scale: Poor Standing balance comment: reliant on RW for stability in standing                            Cognition Arousal/Alertness: Awake/alert Behavior During Therapy: WFL for tasks assessed/performed Overall Cognitive Status: Within Functional Limits for tasks assessed                                        Exercises Total Joint Exercises Ankle Circles/Pumps: AROM;Left;20 reps;Supine Quad Sets: AROM;Left;10 reps;Supine Heel Slides: AAROM;Left;10 reps;Supine Hip ABduction/ADduction: AROM;Left;10 reps;Supine Straight Leg Raises: AAROM;Left;10 reps;Supine Goniometric ROM: 0-90    General Comments        Pertinent Vitals/Pain Pain Assessment: 0-10 Pain Score: 3  Pain Location: L knee Pain Descriptors / Indicators: Grimacing;Guarding;Sore;Aching Pain Intervention(s): Monitored during session;Premedicated before session;Repositioned;Ice applied    Home Living Family/patient expects to be discharged to:: Private residence Living Arrangements: Children Available Help at Discharge: Family;Available 24 hours/day Type of Home: House Home Access: Stairs to enter   Home Layout: One level Home Equipment: Wheelchair - manual Additional Comments: 3 in 1 and RW have been delivered to pt's room    Prior Function Level of Independence:  Independent      Comments: completely independent and doing everything for herself prior to surgery   PT Goals (current goals can now be found in the care plan section) Acute Rehab PT Goals Patient Stated Goal: to have less pain Progress towards PT goals:  Progressing toward goals    Frequency    7X/week      PT Plan Current plan remains appropriate    Co-evaluation              AM-PAC PT "6 Clicks" Daily Activity  Outcome Measure  Difficulty turning over in bed (including adjusting bedclothes, sheets and blankets)?: None Difficulty moving from lying on back to sitting on the side of the bed? : Total Difficulty sitting down on and standing up from a chair with arms (e.g., wheelchair, bedside commode, etc,.)?: A Lot Help needed moving to and from a bed to chair (including a wheelchair)?: A Little Help needed walking in hospital room?: A Little Help needed climbing 3-5 steps with a railing? : A Lot 6 Click Score: 15    End of Session Equipment Utilized During Treatment: Gait belt Activity Tolerance: Patient tolerated treatment well Patient left: in chair;with call bell/phone within reach;with family/visitor present Nurse Communication: Mobility status PT Visit Diagnosis: Muscle weakness (generalized) (M62.81);Difficulty in walking, not elsewhere classified (R26.2);Pain Pain - Right/Left: Left Pain - part of body: Knee     Time: 3491-7915 PT Time Calculation (min) (ACUTE ONLY): 24 min  Charges:  $Gait Training: 8-22 mins $Therapeutic Exercise: 8-22 mins                    G Codes:       Julie Dougherty PT, DPT  [phone]    Julie Dougherty 08/30/2016, 4:01 PM

## 2016-08-30 NOTE — Evaluation (Signed)
Physical Therapy Evaluation Patient Details Name: Julie Dougherty MRN: [id number] DOB: [birth year] Today's Date: 08/30/2016   History of Present Illness  Pt is a 66 yo female admitted on 08/29/16 for L TKA. PMH significant for diverticulitis, rectal bleed, HLD, HTN, GERD.   Clinical Impression  Pt is POD 1 following the above procedure. Prior to admission, pt lived with her family in a single level home and will return home with her son at discharge. Pt was completely independent. Pt requires Min A for bed mobility and Min guard for a majority of all mobility this session. Pt will benefit from continued acute PT follow-up in order to address the below deficits prior to DC.     Follow Up Recommendations DC plan and follow up therapy as arranged by surgeon    Equipment Recommendations  Rolling walker with 5" wheels;3in1 (PT)    Recommendations for Other Services       Precautions / Restrictions Precautions Precautions: Knee Precaution Booklet Issued: Yes (comment) Precaution Comments: reviewed no pillow under knee Required Braces or Orthoses: Knee Immobilizer - Left Knee Immobilizer - Left: Other (comment) (in room, no order) Restrictions Weight Bearing Restrictions: Yes LLE Weight Bearing: Weight bearing as tolerated      Mobility  Bed Mobility Overal bed mobility: Needs Assistance Bed Mobility: Supine to Sit     Supine to sit: Min assist     General bed mobility comments: Min A to bring LLE EOB  Transfers Overall transfer level: Needs assistance Equipment used: Rolling walker (2 wheeled) Transfers: Sit to/from Stand Sit to Stand: Min guard         General transfer comment: Min gaurd for safety from EOB. Cues for hand placement  Ambulation/Gait Ambulation/Gait assistance: Min guard Ambulation Distance (Feet): 250 Feet Assistive device: Rolling walker (2 wheeled) Gait Pattern/deviations: Step-through pattern;Decreased step length - right;Decreased stance  time - left;Antalgic Gait velocity: decreased Gait velocity interpretation: Below normal speed for age/gender General Gait Details: Moderate antalgic gait with cues for sequencing and proximity to [employer]    Modified Rankin (Stroke Patients Only)       Balance Overall balance assessment: Needs assistance Sitting-balance support: No upper extremity supported;Feet supported Sitting balance-Leahy Scale: Good     Standing balance support: Bilateral upper extremity supported Standing balance-Leahy Scale: Poor Standing balance comment: reliant on RW for stability in standing                             Pertinent Vitals/Pain Pain Assessment: 0-10 Pain Score: 5  Pain Location: left knee Pain Descriptors / Indicators: Grimacing;Guarding;Sore Pain Intervention(s): Monitored during session;Premedicated before session;Repositioned;Ice applied    Home Living Family/patient expects to be discharged to:: Private residence Living Arrangements: Children (son) Available Help at Discharge: Family;Available 24 hours/day Type of Home: House Home Access: Stairs to enter   [employer] of Steps: 2 Home Layout: One level Home Equipment: Wheelchair - manual      Prior Function Level of Independence: Independent         Comments: completely independent and doing everything for herself prior to surgery     Hand Dominance   Dominant Hand: Right    Extremity/Trunk Assessment   Upper Extremity Assessment Upper Extremity Assessment: Defer to OT evaluation    Lower Extremity Assessment Lower Extremity Assessment: LLE deficits/detail LLE Deficits / Details: pt with  post op pain and weakness. At least 3/5 ankle and 2/5 knee and hip per gross functional assessment    Cervical / Trunk Assessment Cervical / Trunk Assessment: Normal  Communication   Communication: Prefers language other than English (spanish, family  translates)  Cognition Arousal/Alertness: Awake/alert Behavior During Therapy: WFL for tasks assessed/performed Overall Cognitive Status: Within Functional Limits for tasks assessed                                        General Comments General comments (skin integrity, edema, etc.): Family willing to translate    Exercises Total Joint Exercises Ankle Circles/Pumps: AROM;Left;20 reps;Supine Quad Sets: AROM;Left;10 reps;Supine Heel Slides: AAROM;Left;10 reps;Supine   Assessment/Plan    PT Assessment Patient needs continued PT services  PT Problem List Decreased strength;Decreased range of motion;Decreased activity tolerance;Decreased balance;Decreased mobility;Decreased knowledge of use of DME;Pain       PT Treatment Interventions DME instruction;Gait training;Stair training;Functional mobility training;Therapeutic activities;Therapeutic exercise;Balance training;Patient/family education    PT Goals (Current goals can be found in the Care Plan section)  Acute Rehab PT Goals Patient Stated Goal: to have less pain PT Goal Formulation: With patient/family Time For Goal Achievement: 09/06/16 Potential to Achieve Goals: Good    Frequency 7X/week   Barriers to discharge        Co-evaluation               AM-PAC PT "6 Clicks" Daily Activity  Outcome Measure Difficulty turning over in bed (including adjusting bedclothes, sheets and blankets)?: None Difficulty moving from lying on back to sitting on the side of the bed? : Total Difficulty sitting down on and standing up from a chair with arms (e.g., wheelchair, bedside commode, etc,.)?: Total Help needed moving to and from a bed to chair (including a wheelchair)?: A Little Help needed walking in hospital room?: A Little Help needed climbing 3-5 steps with a railing? : A Lot 6 Click Score: 14    End of Session Equipment Utilized During Treatment: Gait belt Activity Tolerance: Patient tolerated treatment  well Patient left: in chair;with call bell/phone within reach;with family/visitor present Nurse Communication: Mobility status PT Visit Diagnosis: Muscle weakness (generalized) (M62.81);Difficulty in walking, not elsewhere classified (R26.2);Pain Pain - Right/Left: Left Pain - part of body: Knee    Time: 0935-1010 PT Time Calculation (min) (ACUTE ONLY): 35 min   Charges:   PT Evaluation $PT Eval Moderate Complexity: 1 Mod PT Treatments $Gait Training: 8-22 mins   PT G Codes:        Scheryl Marten PT, DPT  [phone]   Jacqulyn Liner Sloan Leiter 08/30/2016, 11:44 AM

## 2016-08-31 MED ORDER — ASPIRIN 325 MG PO TBEC: 325.0000 mg | DELAYED_RELEASE_TABLET | Freq: Two times a day (BID) | ORAL | 0 refills | Status: DC

## 2016-08-31 MED ORDER — OXYCODONE-ACETAMINOPHEN 5-325 MG PO TABS: 1.0000 | ORAL_TABLET | ORAL | 0 refills | Status: DC | PRN

## 2016-08-31 MED ORDER — METHOCARBAMOL 500 MG PO TABS: 500.0000 mg | ORAL_TABLET | Freq: Four times a day (QID) | ORAL | 1 refills | Status: DC | PRN

## 2016-08-31 NOTE — Progress Notes (Signed)
Occupational Therapy Treatment Patient Details Name: Julie Dougherty MRN: [id number] DOB: March 09, 1950 Today's Date: 08/31/2016    History of present illness Pt is a 66 yo female admitted on 08/29/16 for L TKA. PMH significant for diverticulitis, rectal bleed, HLD, HTN, GERD.    OT comments  Pt progressing towards acute OT goals. Focus of session was tub transfer using 3n1 as shower seat. D/c plan remains appropriate.    Follow Up Recommendations  DC plan and follow up therapy as arranged by surgeon    Equipment Recommendations  None recommended by OT    Recommendations for Other Services      Precautions / Restrictions Precautions Precautions: Knee Precaution Booklet Issued: Yes (comment) Precaution Comments: reviewed no pillow under knee Required Braces or Orthoses: Knee Immobilizer - Left Knee Immobilizer - Left: Other (comment) (no order, did not use this session) Restrictions Weight Bearing Restrictions: Yes LLE Weight Bearing: Weight bearing as tolerated       Mobility Bed Mobility Overal bed mobility: Needs Assistance Bed Mobility: Supine to Sit     Supine to sit: Min assist     General bed mobility comments: up in chair  Transfers Overall transfer level: Needs assistance Equipment used: Rolling walker (2 wheeled) Transfers: Sit to/from Stand Sit to Stand: Supervision         General transfer comment: from recliner and 3n1    Balance Overall balance assessment: Needs assistance Sitting-balance support: No upper extremity supported;Feet supported Sitting balance-Leahy Scale: Good     Standing balance support: No upper extremity supported Standing balance-Leahy Scale: Fair Standing balance comment: able to stand without UE support statically in order for therapist to adjust RW                           ADL either performed or assessed with clinical judgement   ADL Overall ADL's : Needs assistance/impaired                         Toilet Transfer: Min guard;Ambulation;RW;BSC       Tub/ Shower Transfer: Tub transfer;Min guard;Ambulation;Rolling walker;3 in 1 Tub/Shower Transfer Details (indicate cue type and reason): Handout, demostrated and pt practiced 2x with simulated setup in room. Family member present and involved in session. Discussed technique, setup, and safety.   General ADL Comments: Pt completed simulated tub transfer in room. Provided corresponding handout.      Vision       Perception     Praxis      Cognition Arousal/Alertness: Awake/alert Behavior During Therapy: WFL for tasks assessed/performed Overall Cognitive Status: Within Functional Limits for tasks assessed                                          Exercises     Shoulder Instructions       General Comments      Pertinent Vitals/ Pain       Pain Assessment: Faces Faces Pain Scale: Hurts a little bit Pain Location: L knee Pain Descriptors / Indicators: Aching;Sore Pain Intervention(s): Monitored during session;Repositioned  Home Living  Prior Functioning/Environment              Frequency  Min 2X/week        Progress Toward Goals  OT Goals(current goals can now be found in the care plan section)  Progress towards OT goals: Progressing toward goals  Acute Rehab OT Goals Patient Stated Goal: to have less pain OT Goal Formulation: With patient/family Time For Goal Achievement: 09/06/16 Potential to Achieve Goals: Good ADL Goals Pt Will Perform Grooming: with supervision;with set-up;with caregiver independent in assisting;standing Pt Will Perform Lower Body Bathing: with min guard assist;with caregiver independent in assisting;sitting/lateral leans;sit to/from stand Pt Will Perform Lower Body Dressing: with min guard assist;with caregiver independent in assisting;sitting/lateral leans;sit to/from stand Pt Will Transfer  to Toilet: with supervision;ambulating Pt Will Perform Toileting - Clothing Manipulation and hygiene: with supervision;sit to/from stand;with caregiver independent in assisting Pt Will Perform Tub/Shower Transfer: with supervision;3 in 1;rolling walker;with caregiver independent in assisting  Plan Discharge plan remains appropriate    Co-evaluation                 AM-PAC PT "6 Clicks" Daily Activity     Outcome Measure   Help from another person eating meals?: None Help from another person taking care of personal grooming?: None Help from another person toileting, which includes using toliet, bedpan, or urinal?: A Little Help from another person bathing (including washing, rinsing, drying)?: A Little Help from another person to put on and taking off regular upper body clothing?: None Help from another person to put on and taking off regular lower body clothing?: A Little 6 Click Score: 21    End of Session Equipment Utilized During Treatment: Gait belt;Rolling walker;Other (comment)  OT Visit Diagnosis: Unsteadiness on feet (R26.81);Pain;Other abnormalities of gait and mobility (R26.89) Pain - Right/Left: Left Pain - part of body: Knee   Activity Tolerance Patient tolerated treatment well   Patient Left in chair;with call bell/phone within reach;with family/visitor present   Nurse Communication          Time: 8329-1916 OT Time Calculation (min): 12 min  Charges: OT General Charges $OT Visit: 1 Procedure OT Treatments $Self Care/Home Management : 8-22 mins     Hortencia Pilar 08/31/2016, 12:47 PM

## 2016-08-31 NOTE — Care Management Note (Signed)
Case Management Note  Patient Details  Name: Julie Dougherty MRN: [id number] Date of Birth: 21-Sep-1950  Subjective/Objective:    66 yr old female s/p left total knee arthroplasty.               Action/Plan: Case manager spoke with patient and her daughter, mainly with daughter who speaks english, concerning discharge plan and DME needs. Patient was preoperatively setup with [facility], no changes. CM has ordered RW and 3in1. Patient will be going to her daughters home to recover, [address] [place].   Expected Discharge Date:  08/31/16               Expected Discharge Plan:  [place]  In-House Referral:  NA  Discharge planning Services  CM Consult  Post Acute Care Choice:  Home Health, Durable Medical Equipment Choice offered to:  Patient, Adult Children  DME Arranged:  3-N-1, Walker rolling DME Agency:  [place]:  PT [place] Agency:  [facility] (formerly [facility])  Status of Service:  Completed, signed off  If discussed at [employer], dates discussed:    Additional Comments:  Ninfa Meeker, RN 08/31/2016, 2:30 PM

## 2016-08-31 NOTE — Discharge Summary (Signed)
Patient ID: Julie Dougherty MRN: [id number] DOB/AGE: [birth year] 66 y.o.  Admit date: 08/29/2016 Discharge date: 08/31/2016  Admission Diagnoses:  Principal Problem:   Unilateral primary osteoarthritis, left knee Active Problems:   Status post total knee replacement, left   Discharge Diagnoses:  Same  Past Medical History:  Diagnosis Date  . Arthritis   . Cataract   . Chronic constipation   . Diverticulitis   . Diverticulosis   . Fatty liver   . GERD (gastroesophageal reflux disease)   . Hemorrhoids, internal, with bleeding Gr 2 prolapsed 06/30/2011   Found on colonoscopy 2011 and anoscopy May 2013   . Hepatitis    "B"    no rx >10 yrs  . Hiatal hernia   . High cholesterol   . Hypertension   . Internal hemorrhoids   . OSA (obstructive sleep apnea)   . PUD (peptic ulcer disease)   . Rectal ulceration 04/27/2009   Associated reactive/regenerative changes and fibromuscular extensions into lamina propria/Mucosal Prolapse  . Sleep apnea    Waiting on CPAP machine    Surgeries: Procedure(s): LEFT TOTAL KNEE ARTHROPLASTY on 08/29/2016   Consultants:   Discharged Condition: Improved  Hospital Course: Julie Dougherty is an 66 y.o. female who was admitted 08/29/2016 for operative treatment ofUnilateral primary osteoarthritis, left knee. Patient has severe unremitting pain that affects sleep, daily activities, and work/hobbies. After pre-op clearance the patient was taken to the operating room on 08/29/2016 and underwent  Procedure(s): LEFT TOTAL KNEE ARTHROPLASTY.    Patient was given perioperative antibiotics: Anti-infectives    Start     Dose/Rate Route Frequency Ordered Stop   08/29/16 1900  ceFAZolin (ANCEF) IVPB 1 g/50 mL premix     1 g 100 mL/hr over 30 Minutes Intravenous Every 6 hours 08/29/16 1732 08/30/16 0144   08/29/16 1048  ceFAZolin (ANCEF) IVPB 2g/100 mL premix     2 g 200 mL/hr over 30 Minutes Intravenous On call to O.R. 08/29/16 1048  08/29/16 1315       Patient was given sequential compression devices, early ambulation, and chemoprophylaxis to prevent DVT.  Patient benefited maximally from hospital stay and there were no complications.    Recent vital signs: Patient Vitals for the past 24 hrs:  BP Temp Temp src Pulse Resp SpO2  08/31/16 0530 (!) 141/76 98.3 F (36.8 C) Oral 95 16 100 %  08/30/16 2050 114/73 98 F (36.7 C) Oral 100 18 99 %  08/30/16 1642 109/72 98 F (36.7 C) Oral (!) 107 18 97 %     Recent laboratory studies:  Recent Labs  08/30/16 0410  WBC 13.1*  HGB 11.7*  HCT 35.3*  PLT 255  NA 134*  K 4.3  CL 102  CO2 23  BUN 14  CREATININE 1.06*  GLUCOSE 136*  CALCIUM 8.6*     Discharge Medications:   Allergies as of 08/31/2016      Reactions   Ciprofloxacin Hives   nausea   Flagyl [metronidazole] Hives   nausea      Medication List    TAKE these medications   amLODipine-benazepril 5-20 MG capsule Commonly known as:  LOTREL Take 1 capsule by mouth daily.   aspirin 325 MG EC tablet Take 1 tablet (325 mg total) by mouth 2 (two) times daily after a meal. What changed:  medication strength  how much to take  when to take this   furosemide 20 MG tablet Commonly known as:  LASIX  Take 20 mg by mouth daily.   methocarbamol 500 MG tablet Commonly known as:  ROBAXIN Take 1 tablet (500 mg total) by mouth every 6 (six) hours as needed for muscle spasms.   omega-3 fish oil 1000 MG Caps capsule Commonly known as:  MAXEPA Take 1 mg by mouth.   oxyCODONE-acetaminophen 5-325 MG tablet Commonly known as:  ROXICET Take 1-2 tablets by mouth every 4 (four) hours as needed for severe pain.   pantoprazole 40 MG tablet Commonly known as:  PROTONIX Take 40 mg by mouth daily.   polyethylene glycol powder powder Commonly known as:  MIRALAX Take 17 g by mouth daily. What changed:  when to take this  reasons to take this   pravastatin 20 MG tablet Commonly known as:   PRAVACHOL Take 20 mg by mouth daily.   vitamin C 1000 MG tablet Take 1,000 mg by mouth daily.            Durable Medical Equipment        Start     Ordered   08/29/16 1733  DME 3 n 1  Once     08/29/16 1732   08/29/16 1733  DME Walker rolling  Once    Question:  Patient needs a walker to treat with the following condition  Answer:  Status post total knee replacement, left   08/29/16 1732      Diagnostic Studies: Dg Knee 1-2 Views Left  Result Date: 08/29/2016 CLINICAL DATA:  Total knee arthroplasty EXAM: LEFT KNEE - 1-2 VIEW COMPARISON:  08/06/2008 FINDINGS: Total knee arthroplasty is well seated. No periprosthetic fracture or dislocation. Expected soft tissue swelling and gas. IMPRESSION: No unexpected finding after total knee arthroplasty. Electronically Signed   By: Monte Fantasia M.D.   On: 08/29/2016 15:45    Disposition: 01-Home or Self Care    Follow-up Information    Mcarthur Rossetti, MD. Call in 2 week(s).   Specialty:  Orthopedic Surgery Contact information: [place] [address] [phone]        Mcarthur Rossetti, MD .   Specialty:  Orthopedic Surgery Contact information: [address] [place] [address] [phone]            Signed: Erskine Emery 08/31/2016, 12:06 PM

## 2016-08-31 NOTE — Discharge Instructions (Signed)

## 2016-08-31 NOTE — Progress Notes (Signed)
Physical Therapy Treatment Patient Details Name: Julie Dougherty MRN: [id number] DOB: 1950/09/22 Today's Date: 08/31/2016    History of Present Illness Pt is a 66 yo female admitted on 08/29/16 for L TKA. PMH significant for diverticulitis, rectal bleed, HLD, HTN, GERD.     PT Comments    Pt demonstrates slower cadence this session and reports increased discomfort as compared to previous sessions. Continues to demonstrate good mobility with RW and good sequencing. Pt will need to perform stair negotiation prior to D/C.    Follow Up Recommendations  DC plan and follow up therapy as arranged by surgeon     Equipment Recommendations  Rolling walker with 5" wheels;3in1 (PT)    Recommendations for Other Services       Precautions / Restrictions Precautions Precautions: Knee Precaution Booklet Issued: Yes (comment) Precaution Comments: reviewed no pillow under knee Required Braces or Orthoses: Knee Immobilizer - Left Knee Immobilizer - Left: Other (comment) (no order, did not use this session) Restrictions Weight Bearing Restrictions: Yes LLE Weight Bearing: Weight bearing as tolerated    Mobility  Bed Mobility Overal bed mobility: Needs Assistance Bed Mobility: Supine to Sit     Supine to sit: Min assist     General bed mobility comments: Min A to bring RLE EOB  Transfers Overall transfer level: Needs assistance Equipment used: Rolling walker (2 wheeled) Transfers: Sit to/from Stand Sit to Stand: Supervision         General transfer comment: supervision this session from EOB  Ambulation/Gait Ambulation/Gait assistance: Min guard;Supervision Ambulation Distance (Feet): 250 Feet Assistive device: Rolling walker (2 wheeled) Gait Pattern/deviations: Step-through pattern;Decreased step length - right;Decreased stance time - left;Antalgic Gait velocity: decreased Gait velocity interpretation: Below normal speed for age/gender General Gait Details: Moderate  antalgic gait with cues for sequencing and proximity to [employer]    Modified Rankin (Stroke Patients Only)       Balance Overall balance assessment: Needs assistance Sitting-balance support: No upper extremity supported;Feet supported Sitting balance-Leahy Scale: Good     Standing balance support: No upper extremity supported Standing balance-Leahy Scale: Fair Standing balance comment: able to stand without UE support statically in order for therapist to adjust RW                            Cognition Arousal/Alertness: Awake/alert Behavior During Therapy: WFL for tasks assessed/performed Overall Cognitive Status: Within Functional Limits for tasks assessed                                        Exercises      General Comments        Pertinent Vitals/Pain Pain Assessment: Faces Faces Pain Scale: Hurts even more Pain Location: L knee Pain Descriptors / Indicators: Grimacing;Guarding;Sore;Aching Pain Intervention(s): Monitored during session;Premedicated before session;Repositioned;Ice applied    Home Living                      Prior Function            PT Goals (current goals can now be found in the care plan section) Acute Rehab PT Goals Patient Stated Goal: to have less pain Progress towards PT goals: Progressing toward goals    Frequency  7X/week      PT Plan Current plan remains appropriate    Co-evaluation              AM-PAC PT "6 Clicks" Daily Activity  Outcome Measure  Difficulty turning over in bed (including adjusting bedclothes, sheets and blankets)?: None Difficulty moving from lying on back to sitting on the side of the bed? : Total Difficulty sitting down on and standing up from a chair with arms (e.g., wheelchair, bedside commode, etc,.)?: A Little Help needed moving to and from a bed to chair (including a wheelchair)?: A Little Help needed walking  in hospital room?: A Little Help needed climbing 3-5 steps with a railing? : A Lot 6 Click Score: 16    End of Session Equipment Utilized During Treatment: Gait belt Activity Tolerance: Patient tolerated treatment well Patient left: in chair;with call bell/phone within reach;with family/visitor present Nurse Communication: Mobility status PT Visit Diagnosis: Muscle weakness (generalized) (M62.81);Difficulty in walking, not elsewhere classified (R26.2);Pain Pain - Right/Left: Left Pain - part of body: Knee     Time: 0926-0953 PT Time Calculation (min) (ACUTE ONLY): 27 min  Charges:  $Gait Training: 23-37 mins                    G Codes:       Scheryl Marten PT, DPT  [phone]    Jacqulyn Liner Sloan Leiter 08/31/2016, 10:45 AM

## 2016-08-31 NOTE — Progress Notes (Signed)
Subjective: 2 Days Post-Op Procedure(s) (LRB): LEFT TOTAL KNEE ARTHROPLASTY (Left) Patient reports pain as moderate.  Doing well no complaints.   Objective: Vital signs in last 24 hours: Temp:  [98 F (36.7 C)-98.3 F (36.8 C)] 98.3 F (36.8 C) (08/02 0530) Pulse Rate:  [95-107] 95 (08/02 0530) Resp:  [16-18] 16 (08/02 0530) BP: (109-141)/(72-76) 141/76 (08/02 0530) SpO2:  [97 %-100 %] 100 % (08/02 0530)  Intake/Output from previous day: 08/01 0701 - 08/02 0700 In: 720 [P.O.:720] Out: -  Intake/Output this shift: No intake/output data recorded.   Recent Labs  08/30/16 0410  HGB 11.7*    Recent Labs  08/30/16 0410  WBC 13.1*  RBC 4.14  HCT 35.3*  PLT 255    Recent Labs  08/30/16 0410  NA 134*  K 4.3  CL 102  CO2 23  BUN 14  CREATININE 1.06*  GLUCOSE 136*  CALCIUM 8.6*   No results for input(s): LABPT, INR in the last 72 hours.  Sensation intact distally Intact pulses distally Dorsiflexion/Plantar flexion intact Incision: dressing C/D/I Compartment soft  Assessment/Plan: 2 Days Post-Op Procedure(s) (LRB): LEFT TOTAL KNEE ARTHROPLASTY (Left) Discharge home with home health  Ok to discharge home after completes steps with PT  Erskine Emery 08/31/2016, 12:07 PM

## 2016-08-31 NOTE — Progress Notes (Signed)
Removed IV, provided discharge education/instructions, all questions and concerns addressed, patient not in distress, discharged home accompanied by family members.

## 2016-08-31 NOTE — Progress Notes (Signed)
Physical Therapy Treatment on 08/31/16  Clinical Impression: Pt presents with improved mobility this session and is able to progress with stair negotiation. Good sequencing and adherence to therapy instructions. Pt continues to have antalgic gait, but this is improving with RW. Pt will benefit from continued HHPT at discharge in order to maximize her outcomes at D/C.    08/31/16 1300  PT Visit Information  Last PT Received On 08/31/16  Assistance Needed +1  History of Present Illness Pt is a 66 yo female admitted on 08/29/16 for L TKA. PMH significant for diverticulitis, rectal bleed, HLD, HTN, GERD.   Subjective Data  Subjective pt states she is feeling good and ready to go  Patient Stated Goal to have less pain  Precautions  Precautions Knee  Precaution Booklet Issued Yes (comment)  Precaution Comments reviewed no pillow under knee  Restrictions  Weight Bearing Restrictions Yes  LLE Weight Bearing WBAT  Pain Assessment  Pain Assessment Faces  Faces Pain Scale 2  Pain Location L knee  Pain Descriptors / Indicators Aching;Sore  Pain Intervention(s) Monitored during session;Premedicated before session;Repositioned;Ice applied  Cognition  Arousal/Alertness Awake/alert  Behavior During Therapy WFL for tasks assessed/performed  Overall Cognitive Status Within Functional Limits for tasks assessed  Bed Mobility  General bed mobility comments up in chair  Transfers  Overall transfer level Needs assistance  Equipment used Rolling walker (2 wheeled)  Transfers Sit to/from Stand  Sit to Stand Supervision  General transfer comment supervision for safety. Progressing to Mod I  Ambulation/Gait  Ambulation/Gait assistance Supervision  Ambulation Distance (Feet) 300 Feet  Assistive device Rolling walker (2 wheeled)  Gait Pattern/deviations Step-through pattern;Decreased step length - right;Decreased stance time - left;Antalgic  General Gait Details Mild antalgic gait. Improved sequencing and  weight bearing through LLE with gait. Cues for keeping RW on the ground throughout gait.   Gait velocity decreased  Gait velocity interpretation Below normal speed for age/gender  Stairs Yes  Stairs assistance Supervision  Stair Management No rails;Step to pattern;Forwards;With walker  Number of Stairs 1  General stair comments step up onto platform and back down with supervision following visual and verbal instruction. Good adherence to sequencing  Balance  Overall balance assessment Needs assistance  Sitting-balance support No upper extremity supported;Feet supported  Sitting balance-Leahy Scale Good  Standing balance support No upper extremity supported  Standing balance-Leahy Scale Fair  PT - End of Session  Equipment Utilized During Treatment Gait belt  Activity Tolerance Patient tolerated treatment well  Patient left in chair;with call bell/phone within reach;with family/visitor present  Nurse Communication Mobility status  PT - Assessment/Plan  PT Plan Current plan remains appropriate  PT Visit Diagnosis Muscle weakness (generalized) (M62.81);Difficulty in walking, not elsewhere classified (R26.2);Pain  Pain - Right/Left Left  Pain - part of body Knee  PT Frequency (ACUTE ONLY) 7X/week  Follow Up Recommendations DC plan and follow up therapy as arranged by surgeon  PT equipment Rolling walker with 5" wheels;3in1 (PT)  AM-PAC PT "6 Clicks" Daily Activity Outcome Measure  Difficulty turning over in bed (including adjusting bedclothes, sheets and blankets)? 4  Difficulty moving from lying on back to sitting on the side of the bed?  4  Difficulty sitting down on and standing up from a chair with arms (e.g., wheelchair, bedside commode, etc,.)? 3  Help needed moving to and from a bed to chair (including a wheelchair)? 3  Help needed walking in hospital room? 3  Help needed climbing 3-5 steps with a railing?  3  6 Click Score 20  Mobility G Code  CJ  PT Goal Progression   Progress towards PT goals Progressing toward goals  PT Time Calculation  PT Start Time (ACUTE ONLY) 1258  PT Stop Time (ACUTE ONLY) 1316  PT Time Calculation (min) (ACUTE ONLY) 18 min  PT General Charges  $$ ACUTE PT VISIT 1 Visit  PT Treatments  $Gait Training 8-22 mins   Scheryl Marten PT, DPT  [phone]

## 2016-09-01 DIAGNOSIS — I10 Essential (primary) hypertension: Secondary | ICD-10-CM | POA: Diagnosis not present

## 2016-09-01 DIAGNOSIS — Z87891 Personal history of nicotine dependence: Secondary | ICD-10-CM | POA: Diagnosis not present

## 2016-09-01 DIAGNOSIS — Z96652 Presence of left artificial knee joint: Secondary | ICD-10-CM | POA: Diagnosis not present

## 2016-09-01 DIAGNOSIS — Z471 Aftercare following joint replacement surgery: Secondary | ICD-10-CM | POA: Diagnosis not present

## 2016-09-04 ENCOUNTER — Telehealth (INDEPENDENT_AMBULATORY_CARE_PROVIDER_SITE_OTHER): Payer: Self-pay | Admitting: Orthopaedic Surgery

## 2016-09-04 DIAGNOSIS — Z471 Aftercare following joint replacement surgery: Secondary | ICD-10-CM | POA: Diagnosis not present

## 2016-09-04 DIAGNOSIS — I10 Essential (primary) hypertension: Secondary | ICD-10-CM | POA: Diagnosis not present

## 2016-09-04 DIAGNOSIS — Z87891 Personal history of nicotine dependence: Secondary | ICD-10-CM | POA: Diagnosis not present

## 2016-09-04 DIAGNOSIS — Z96652 Presence of left artificial knee joint: Secondary | ICD-10-CM | POA: Diagnosis not present

## 2016-09-04 NOTE — Telephone Encounter (Signed)
MARIA WITH [facility] STATED PT HAS BEEN SEEN Friday AND REQUEST 1 WK 1 3 WK 2  [phone]

## 2016-09-05 NOTE — Telephone Encounter (Signed)
Verbal orders given  

## 2016-09-06 DIAGNOSIS — Z87891 Personal history of nicotine dependence: Secondary | ICD-10-CM | POA: Diagnosis not present

## 2016-09-06 DIAGNOSIS — Z96652 Presence of left artificial knee joint: Secondary | ICD-10-CM | POA: Diagnosis not present

## 2016-09-06 DIAGNOSIS — Z471 Aftercare following joint replacement surgery: Secondary | ICD-10-CM | POA: Diagnosis not present

## 2016-09-06 DIAGNOSIS — I10 Essential (primary) hypertension: Secondary | ICD-10-CM | POA: Diagnosis not present

## 2016-09-07 DIAGNOSIS — Z87891 Personal history of nicotine dependence: Secondary | ICD-10-CM | POA: Diagnosis not present

## 2016-09-07 DIAGNOSIS — I10 Essential (primary) hypertension: Secondary | ICD-10-CM | POA: Diagnosis not present

## 2016-09-07 DIAGNOSIS — Z96652 Presence of left artificial knee joint: Secondary | ICD-10-CM | POA: Diagnosis not present

## 2016-09-07 DIAGNOSIS — Z471 Aftercare following joint replacement surgery: Secondary | ICD-10-CM | POA: Diagnosis not present

## 2016-09-11 DIAGNOSIS — I10 Essential (primary) hypertension: Secondary | ICD-10-CM | POA: Diagnosis not present

## 2016-09-11 DIAGNOSIS — Z87891 Personal history of nicotine dependence: Secondary | ICD-10-CM | POA: Diagnosis not present

## 2016-09-11 DIAGNOSIS — Z471 Aftercare following joint replacement surgery: Secondary | ICD-10-CM | POA: Diagnosis not present

## 2016-09-11 DIAGNOSIS — Z96652 Presence of left artificial knee joint: Secondary | ICD-10-CM | POA: Diagnosis not present

## 2016-09-12 ENCOUNTER — Ambulatory Visit (INDEPENDENT_AMBULATORY_CARE_PROVIDER_SITE_OTHER): Payer: Medicare Other | Admitting: Orthopaedic Surgery

## 2016-09-12 ENCOUNTER — Other Ambulatory Visit (INDEPENDENT_AMBULATORY_CARE_PROVIDER_SITE_OTHER): Payer: Self-pay

## 2016-09-12 DIAGNOSIS — Z96652 Presence of left artificial knee joint: Secondary | ICD-10-CM

## 2016-09-12 MED ORDER — OXYCODONE-ACETAMINOPHEN 5-325 MG PO TABS: 1.0000 | ORAL_TABLET | Freq: Four times a day (QID) | ORAL | 0 refills | Status: DC | PRN

## 2016-09-12 NOTE — Progress Notes (Signed)
The patient is 2 weeks today status post a left total knee arthroplasty. Who family who is interpreting for they said she's been able to flex her knee to 19.  On examination her calf is soft. Her knee incision looks great. Removed the old Steri-Strips and placed new Steri-Strips. The knee feels stable ligamentously. Her foot is well perfused.  We'll transition her to outpatient physical therapy to work on continued range of motion and strengthening as well as gait training balance and coordination. We'll see her back in 4 weeks to see how she doing overall. I did refill her Percocet.

## 2016-09-13 DIAGNOSIS — Z96652 Presence of left artificial knee joint: Secondary | ICD-10-CM | POA: Diagnosis not present

## 2016-09-13 DIAGNOSIS — Z87891 Personal history of nicotine dependence: Secondary | ICD-10-CM | POA: Diagnosis not present

## 2016-09-13 DIAGNOSIS — Z471 Aftercare following joint replacement surgery: Secondary | ICD-10-CM | POA: Diagnosis not present

## 2016-09-13 DIAGNOSIS — I10 Essential (primary) hypertension: Secondary | ICD-10-CM | POA: Diagnosis not present

## 2016-09-15 DIAGNOSIS — I10 Essential (primary) hypertension: Secondary | ICD-10-CM | POA: Diagnosis not present

## 2016-09-15 DIAGNOSIS — Z96652 Presence of left artificial knee joint: Secondary | ICD-10-CM | POA: Diagnosis not present

## 2016-09-15 DIAGNOSIS — Z471 Aftercare following joint replacement surgery: Secondary | ICD-10-CM | POA: Diagnosis not present

## 2016-09-15 DIAGNOSIS — Z87891 Personal history of nicotine dependence: Secondary | ICD-10-CM | POA: Diagnosis not present

## 2016-09-20 ENCOUNTER — Ambulatory Visit: Payer: Medicare Other | Attending: Orthopaedic Surgery | Admitting: Physical Therapy

## 2016-09-20 ENCOUNTER — Encounter: Payer: Self-pay | Admitting: Physical Therapy

## 2016-09-20 DIAGNOSIS — R262 Difficulty in walking, not elsewhere classified: Secondary | ICD-10-CM | POA: Diagnosis not present

## 2016-09-20 DIAGNOSIS — R2242 Localized swelling, mass and lump, left lower limb: Secondary | ICD-10-CM | POA: Diagnosis not present

## 2016-09-20 DIAGNOSIS — M25662 Stiffness of left knee, not elsewhere classified: Secondary | ICD-10-CM | POA: Diagnosis not present

## 2016-09-20 DIAGNOSIS — M25562 Pain in left knee: Secondary | ICD-10-CM

## 2016-09-20 NOTE — Therapy (Signed)
[address] [place], [place], [address] Phone: [phone]   Fax:  [phone]  Physical Therapy Evaluation  Patient Details  Name: Julie Dougherty MRN: [id number] Date of Birth: 08-11-1950 Referring Provider: Ninfa Linden  Encounter Date: 09/20/2016      PT End of Session - 09/20/16 1640    Visit Number 1   Date for PT Re-Evaluation 11/20/16   PT Start Time 3295   PT Stop Time 1710   PT Time Calculation (min) 54 min   Activity Tolerance Patient tolerated treatment well   Behavior During Therapy [facility] for tasks assessed/performed      Past Medical History:  Diagnosis Date  . Arthritis   . Cataract   . Chronic constipation   . Diverticulitis   . Diverticulosis   . Fatty liver   . GERD (gastroesophageal reflux disease)   . Hemorrhoids, internal, with bleeding Gr 2 prolapsed 06/30/2011   Found on colonoscopy 2011 and anoscopy May 2013   . Hepatitis    "B"    no rx >10 yrs  . Hiatal hernia   . High cholesterol   . Hypertension   . Internal hemorrhoids   . OSA (obstructive sleep apnea)   . PUD (peptic ulcer disease)   . Rectal ulceration 04/27/2009   Associated reactive/regenerative changes and fibromuscular extensions into lamina propria/Mucosal Prolapse  . Sleep apnea    Waiting on CPAP machine    Past Surgical History:  Procedure Laterality Date  . ABDOMINAL HYSTERECTOMY    . COLONOSCOPY  04/27/09    POLYPOID FRAGMENT OF COLONIC MUCOSA WITH SURFACE  . HEMORRHOID BANDING    . TOTAL KNEE ARTHROPLASTY Left 08/29/2016   Procedure: LEFT TOTAL KNEE ARTHROPLASTY;  Surgeon: Mcarthur Rossetti, MD;  Location: [facility];  Service: Orthopedics;  Laterality: Left;  . TUBAL LIGATION      There were no vitals filed for this visit.       Subjective Assessment - 09/20/16 1623    Subjective Patient reports that she had been having left knee pain about 4-6 months, no known cause.  She  underwent left TKR on 08/29/16.  She had home PT until Friday.   Limitations Lifting;Standing;Walking;House hold activities   Patient Stated Goals have no pain, good motions and walk without difficulty   Currently in Pain? Yes   Pain Score 2    Pain Location Knee   Pain Orientation Left   Pain Descriptors / Indicators Aching   Pain Type Surgical pain   Pain Onset 1 to 4 weeks ago   Pain Frequency Constant   Aggravating Factors  bending, walking, pain a 5/10 but she is on pain medication   Pain Relieving Factors pain medication and rest, can be 2/10   Effect of Pain on Daily Activities limits all ADL's and walking            [facility] PT Assessment - 09/20/16 0001      Assessment   Medical Diagnosis s/p left TKR   Referring Provider Ninfa Linden   Onset Date/Surgical Date 08/29/16   Prior Therapy at home     Precautions   Precautions None     Balance Screen   Has the patient fallen in the past 6 months No   Has the patient had a decrease in activity level because of a fear of falling?  No   Is the patient reluctant to leave their home because of a fear  of falling?  No     Home Environment   Additional Comments has stairs, does housework     Prior Function   Level of Independence Independent   Vocation Retired   Leisure has grand kids that she watches     Observation/Other Assessments-Edema    Edema Circumferential     Circumferential Edema   Circumferential - Right 27   Circumferential - Left  50 cm     ROM / Strength   AROM / PROM / Strength AROM;PROM;Strength     AROM   AROM Assessment Site Knee   Right/Left Knee Left   Left Knee Extension 19   Left Knee Flexion 84     PROM   PROM Assessment Site Knee   Right/Left Knee Left   Left Knee Extension 5   Left Knee Flexion 95     Strength   Overall Strength Comments 4-/5     Palpation   Palpation comment pain and tenderness around the patella and along the joint ling, scar is well healed.  Mild warmth, some  puckering of the scar proximally     Ambulation/Gait   Gait Comments walks with a SBQC, slow, antalgic on the left            Objective measurements completed on examination: See above findings.          [facility] Adult PT Treatment/Exercise - 09/20/16 0001      Exercises   Exercises Knee/Hip     Knee/Hip Exercises: Aerobic   Nustep level 4 x 6 minutes     Knee/Hip Exercises: Machines for Strengthening   Cybex Knee Extension 5# 2x10   Cybex Knee Flexion 20# 2x10     Modalities   Modalities --     Vasopneumatic   Number Minutes Vasopneumatic  --   Vasopnuematic Location  --   Vasopneumatic Pressure --   Vasopneumatic Temperature  --                PT Education - 09/20/16 1639    Education provided Yes   Education Details gave HEP for low load long duration stretches   Person(s) Educated Patient   Methods Explanation;Demonstration;Handout   Comprehension Verbalized understanding          PT Short Term Goals - 09/20/16 1643      PT SHORT TERM GOAL #1   Title independent with initial HEP   Time 2   Period Weeks   Status New           PT Long Term Goals - 09/20/16 1643      PT LONG TERM GOAL #1   Title decrease pain 50%   Time 8   Period Weeks   Status New     PT LONG TERM GOAL #2   Title walk without device and minimal deviation   Time 8   Period Weeks   Status New     PT LONG TERM GOAL #3   Title increase AROM to 0-110 degrees flexion   Time 8   Period Weeks   Status New     PT LONG TERM GOAL #4   Title decrease swelling 50%   Period Weeks   Status New                Plan - 09/20/16 1640    Clinical Impression Statement Patient had left TKR on 08/29/16, she is doing well with gait using a quad cane, slow, her AROM was 19-84  degrees flexion.  Has 3 cm swelling of the left > right   Clinical Presentation Evolving   Clinical Presentation due to: recent suregery   Clinical Decision Making Low   Rehab Potential Good       Patient will benefit from skilled therapeutic intervention in order to improve the following deficits and impairments:  Abnormal gait, Decreased activity tolerance, Decreased balance, Decreased mobility, Decreased strength, Increased edema, Pain, Decreased endurance, Difficulty walking, Decreased range of motion  Visit Diagnosis: Acute pain of left knee - Plan: PT plan of care cert/re-cert  Difficulty in walking, not elsewhere classified - Plan: PT plan of care cert/re-cert  Stiffness of left knee, not elsewhere classified - Plan: PT plan of care cert/re-cert  Localized swelling, mass and lump, left lower limb - Plan: PT plan of care cert/re-cert      G-Codes - 20/94/70 1703    Functional Assessment Tool Used (Outpatient Only) foto 66% limitation   Functional Limitation Mobility: Walking and moving around   Mobility: Walking and Moving Around Current Status [phone]) At least 60 percent but less than 80 percent impaired, limited or restricted   Mobility: Walking and Moving Around Goal Status [phone]) At least 40 percent but less than 60 percent impaired, limited or restricted       Problem List Patient Active Problem List   Diagnosis Date Noted  . Status post total knee replacement, left 08/29/2016  . Unilateral primary osteoarthritis, left knee 07/17/2016  . Acute pain of left knee 06/28/2016  . Baker's cyst of knee, left - suspected 05/30/2016  . Diverticulitis of colon 03/17/2016  . Hemorrhoids, internal, with bleeding Gr 2 prolapsed 06/30/2011  . Diverticulitis 06/06/2011  . Rectal bleeding 04/19/2009  . ABDOMINAL PAIN, RIGHT LOWER QUADRANT 04/19/2009  . HYPERLIPIDEMIA 07/11/2006  . HYPERTENSION, ESSENTIAL NOS 07/11/2006  . GERD 07/11/2006  . HEPATITIS B, HX OF 07/11/2006    Sumner Boast., PT 09/20/2016, 5:05 PM  [address] [place], [place], [address] Phone: [phone]   Fax:   [phone]  Name: Julie Dougherty MRN: [id number] Date of Birth: 07/26/1950

## 2016-09-26 ENCOUNTER — Ambulatory Visit: Payer: Medicare Other | Admitting: Physical Therapy

## 2016-09-26 ENCOUNTER — Encounter: Payer: Self-pay | Admitting: Physical Therapy

## 2016-09-26 DIAGNOSIS — R2242 Localized swelling, mass and lump, left lower limb: Secondary | ICD-10-CM

## 2016-09-26 DIAGNOSIS — M25562 Pain in left knee: Secondary | ICD-10-CM

## 2016-09-26 DIAGNOSIS — R262 Difficulty in walking, not elsewhere classified: Secondary | ICD-10-CM

## 2016-09-26 DIAGNOSIS — M25662 Stiffness of left knee, not elsewhere classified: Secondary | ICD-10-CM | POA: Diagnosis not present

## 2016-09-26 NOTE — Therapy (Signed)
[address] [place], [place], [address] Phone: [phone]   Fax:  [phone]  Physical Therapy Treatment  Patient Details  Name: Julie Dougherty MRN: [id number] Date of Birth: 1950/10/29 Referring Provider: Ninfa Linden  Encounter Date: 09/26/2016      PT End of Session - 09/26/16 1141    Visit Number 2   Date for PT Re-Evaluation 11/20/16   PT Start Time 1111   PT Stop Time 6063   PT Time Calculation (min) 38 min      Past Medical History:  Diagnosis Date  . Arthritis   . Cataract   . Chronic constipation   . Diverticulitis   . Diverticulosis   . Fatty liver   . GERD (gastroesophageal reflux disease)   . Hemorrhoids, internal, with bleeding Gr 2 prolapsed 06/30/2011   Found on colonoscopy 2011 and anoscopy May 2013   . Hepatitis    "B"    no rx >10 yrs  . Hiatal hernia   . High cholesterol   . Hypertension   . Internal hemorrhoids   . OSA (obstructive sleep apnea)   . PUD (peptic ulcer disease)   . Rectal ulceration 04/27/2009   Associated reactive/regenerative changes and fibromuscular extensions into lamina propria/Mucosal Prolapse  . Sleep apnea    Waiting on CPAP machine    Past Surgical History:  Procedure Laterality Date  . ABDOMINAL HYSTERECTOMY    . COLONOSCOPY  04/27/09    POLYPOID FRAGMENT OF COLONIC MUCOSA WITH SURFACE  . HEMORRHOID BANDING    . TOTAL KNEE ARTHROPLASTY Left 08/29/2016   Procedure: LEFT TOTAL KNEE ARTHROPLASTY;  Surgeon: Mcarthur Rossetti, MD;  Location: [facility];  Service: Orthopedics;  Laterality: Left;  . TUBAL LIGATION      There were no vitals filed for this visit.      Subjective Assessment - 09/26/16 1112    Subjective Pt reports that she is doing fine with only a little pain   Patient is accompained by: Family member  as interpreter    Currently in Pain? Yes   Pain Score 2    Pain Location Knee   Pain Orientation Left                          [facility] Adult PT Treatment/Exercise - 09/26/16 0001      Knee/Hip Exercises: Aerobic   Nustep level 4 x 6 minutes     Knee/Hip Exercises: Seated   Long Arc Quad Left;2 sets;10 reps   Hamstring Curl 2 sets;Left;Strengthening;10 reps   Hamstring Limitations red tband    Sit to Sand 2 sets;10 reps;without UE support     Knee/Hip Exercises: Supine   Quad Sets 1 set;Left;10 reps  mamual resistance     Modalities   Modalities Cryotherapy     Cryotherapy   Number Minutes Cryotherapy 10 Minutes   Cryotherapy Location Knee   Type of Cryotherapy Ice pack     Manual Therapy   Manual Therapy Passive ROM   Passive ROM L knee flex and ext                  PT Short Term Goals - 09/20/16 1643      PT SHORT TERM GOAL #1   Title independent with initial HEP   Time 2   Period Weeks   Status New  PT Long Term Goals - 09/20/16 1643      PT LONG TERM GOAL #1   Title decrease pain 50%   Time 8   Period Weeks   Status New     PT LONG TERM GOAL #2   Title walk without device and minimal deviation   Time 8   Period Weeks   Status New     PT LONG TERM GOAL #3   Title increase AROM to 0-110 degrees flexion   Time 8   Period Weeks   Status New     PT LONG TERM GOAL #4   Title decrease swelling 50%   Period Weeks   Status New               Plan - 09/26/16 1142    Clinical Impression Statement Pt ~ 11 minutes late for today's treatment session, she does reports some pain at end range of MT. All exercises performed well, she continues to do well with gait using SPC.   Rehab Potential Good   PT Frequency 2x / week   PT Duration 8 weeks   PT Next Visit Plan L knee strenght and ROM      Patient will benefit from skilled therapeutic intervention in order to improve the following deficits and impairments:  Abnormal gait, Decreased activity tolerance, Decreased balance, Decreased mobility, Decreased strength,  Increased edema, Pain, Decreased endurance, Difficulty walking, Decreased range of motion  Visit Diagnosis: Acute pain of left knee  Difficulty in walking, not elsewhere classified  Stiffness of left knee, not elsewhere classified  Localized swelling, mass and lump, left lower limb     Problem List Patient Active Problem List   Diagnosis Date Noted  . Status post total knee replacement, left 08/29/2016  . Unilateral primary osteoarthritis, left knee 07/17/2016  . Acute pain of left knee 06/28/2016  . Baker's cyst of knee, left - suspected 05/30/2016  . Diverticulitis of colon 03/17/2016  . Hemorrhoids, internal, with bleeding Gr 2 prolapsed 06/30/2011  . Diverticulitis 06/06/2011  . Rectal bleeding 04/19/2009  . ABDOMINAL PAIN, RIGHT LOWER QUADRANT 04/19/2009  . HYPERLIPIDEMIA 07/11/2006  . HYPERTENSION, ESSENTIAL NOS 07/11/2006  . GERD 07/11/2006  . HEPATITIS B, HX OF 07/11/2006    Scot Jun, PTA 09/26/2016, 11:46 AM  [address] [place], [place], [address] Phone: [phone]   Fax:  [phone]  Name: Julie Dougherty MRN: [id number] Date of Birth: August 20, 1950

## 2016-09-28 ENCOUNTER — Encounter: Payer: Self-pay | Admitting: Physical Therapy

## 2016-09-28 ENCOUNTER — Ambulatory Visit: Payer: Medicare Other | Admitting: Physical Therapy

## 2016-09-28 DIAGNOSIS — R2242 Localized swelling, mass and lump, left lower limb: Secondary | ICD-10-CM | POA: Diagnosis not present

## 2016-09-28 DIAGNOSIS — R262 Difficulty in walking, not elsewhere classified: Secondary | ICD-10-CM | POA: Diagnosis not present

## 2016-09-28 DIAGNOSIS — M25662 Stiffness of left knee, not elsewhere classified: Secondary | ICD-10-CM | POA: Diagnosis not present

## 2016-09-28 DIAGNOSIS — M25562 Pain in left knee: Secondary | ICD-10-CM | POA: Diagnosis not present

## 2016-09-28 NOTE — Therapy (Signed)
[address] [place], [place], [address] Phone: [phone]   Fax:  [phone]  Physical Therapy Treatment  Patient Details  Name: Julie Dougherty MRN: [id number] Date of Birth: May 13, 1950 Referring Provider: Ninfa Linden  Encounter Date: 09/28/2016      PT End of Session - 09/28/16 0962    Visit Number 3   Date for PT Re-Evaluation 11/20/16   PT Start Time 1111   PT Stop Time 1153   PT Time Calculation (min) 42 min   Activity Tolerance Patient tolerated treatment well   Behavior During Therapy [facility] for tasks assessed/performed      Past Medical History:  Diagnosis Date  . Arthritis   . Cataract   . Chronic constipation   . Diverticulitis   . Diverticulosis   . Fatty liver   . GERD (gastroesophageal reflux disease)   . Hemorrhoids, internal, with bleeding Gr 2 prolapsed 06/30/2011   Found on colonoscopy 2011 and anoscopy May 2013   . Hepatitis    "B"    no rx >10 yrs  . Hiatal hernia   . High cholesterol   . Hypertension   . Internal hemorrhoids   . OSA (obstructive sleep apnea)   . PUD (peptic ulcer disease)   . Rectal ulceration 04/27/2009   Associated reactive/regenerative changes and fibromuscular extensions into lamina propria/Mucosal Prolapse  . Sleep apnea    Waiting on CPAP machine    Past Surgical History:  Procedure Laterality Date  . ABDOMINAL HYSTERECTOMY    . COLONOSCOPY  04/27/09    POLYPOID FRAGMENT OF COLONIC MUCOSA WITH SURFACE  . HEMORRHOID BANDING    . TOTAL KNEE ARTHROPLASTY Left 08/29/2016   Procedure: LEFT TOTAL KNEE ARTHROPLASTY;  Surgeon: Mcarthur Rossetti, MD;  Location: [facility];  Service: Orthopedics;  Laterality: Left;  . TUBAL LIGATION      There were no vitals filed for this visit.      Subjective Assessment - 09/28/16 1111    Subjective Pt reports a little pain, not much   Currently in Pain? Yes   Pain Score 2                           [facility] Adult PT Treatment/Exercise - 09/28/16 0001      Knee/Hip Exercises: Aerobic   Nustep level 4 x 7 minutes     Knee/Hip Exercises: Machines for Strengthening   Cybex Knee Extension 5lb 2x10   Cybex Knee Flexion 20# 2x10 LLE 15lb x10   Cybex Leg Press 30lb 2x10, LLE only no weight x4     Knee/Hip Exercises: Standing   Lateral Step Up Left;1 set;10 reps;Hand Hold: 0;Step Height: 4"   Forward Step Up Left;1 set;Hand Hold: 0;Step Height: 4";10 reps     Cryotherapy   Number Minutes Cryotherapy 10 Minutes   Cryotherapy Location Knee   Type of Cryotherapy Ice pack                  PT Short Term Goals - 09/20/16 1643      PT SHORT TERM GOAL #1   Title independent with initial HEP   Time 2   Period Weeks   Status New           PT Long Term Goals - 09/20/16 1643      PT LONG TERM GOAL #1   Title decrease pain 50%   Time  8   Period Weeks   Status New     PT LONG TERM GOAL #2   Title walk without device and minimal deviation   Time 8   Period Weeks   Status New     PT LONG TERM GOAL #3   Title increase AROM to 0-110 degrees flexion   Time 8   Period Weeks   Status New     PT LONG TERM GOAL #4   Title decrease swelling 50%   Period Weeks   Status New               Plan - 09/28/16 1145    Clinical Impression Statement Again pt ~ 11 minutes late for today's session. Pt able to complete all of today's exercises well. Does reports some pain with SL leg press during the stretching phase. Able to perform step ups without compensation.    Rehab Potential Good   PT Frequency 2x / week   PT Duration 8 weeks   PT Treatment/Interventions Cryotherapy;Electrical Stimulation;Gait training;Stair training;Functional mobility training;Patient/family education;Neuromuscular re-education;Balance training;Therapeutic exercise;Therapeutic activities;Manual techniques;Vasopneumatic Device   PT Next Visit Plan L knee  strenght and ROM      Patient will benefit from skilled therapeutic intervention in order to improve the following deficits and impairments:  Abnormal gait, Decreased activity tolerance, Decreased balance, Decreased mobility, Decreased strength, Increased edema, Pain, Decreased endurance, Difficulty walking, Decreased range of motion  Visit Diagnosis: Acute pain of left knee  Difficulty in walking, not elsewhere classified  Stiffness of left knee, not elsewhere classified  Localized swelling, mass and lump, left lower limb     Problem List Patient Active Problem List   Diagnosis Date Noted  . Status post total knee replacement, left 08/29/2016  . Unilateral primary osteoarthritis, left knee 07/17/2016  . Acute pain of left knee 06/28/2016  . Baker's cyst of knee, left - suspected 05/30/2016  . Diverticulitis of colon 03/17/2016  . Hemorrhoids, internal, with bleeding Gr 2 prolapsed 06/30/2011  . Diverticulitis 06/06/2011  . Rectal bleeding 04/19/2009  . ABDOMINAL PAIN, RIGHT LOWER QUADRANT 04/19/2009  . HYPERLIPIDEMIA 07/11/2006  . HYPERTENSION, ESSENTIAL NOS 07/11/2006  . GERD 07/11/2006  . HEPATITIS B, HX OF 07/11/2006    Scot Jun, PTA 09/28/2016, 11:46 AM  [address] [place], [place], [address] Phone: [phone]   Fax:  [phone]  Name: Julie Dougherty MRN: [id number] Date of Birth: 21-Jun-1950

## 2016-10-03 ENCOUNTER — Ambulatory Visit: Payer: Medicare Other | Attending: Orthopaedic Surgery | Admitting: Physical Therapy

## 2016-10-03 ENCOUNTER — Encounter: Payer: Self-pay | Admitting: Physical Therapy

## 2016-10-03 DIAGNOSIS — R262 Difficulty in walking, not elsewhere classified: Secondary | ICD-10-CM | POA: Insufficient documentation

## 2016-10-03 DIAGNOSIS — M25662 Stiffness of left knee, not elsewhere classified: Secondary | ICD-10-CM | POA: Diagnosis not present

## 2016-10-03 DIAGNOSIS — R2242 Localized swelling, mass and lump, left lower limb: Secondary | ICD-10-CM

## 2016-10-03 DIAGNOSIS — M25562 Pain in left knee: Secondary | ICD-10-CM | POA: Insufficient documentation

## 2016-10-03 NOTE — Therapy (Signed)
[place], [place], [address] Phone: [phone]   Fax:  [phone]  Physical Therapy Treatment  Patient Details  Name: Julie Dougherty MRN: [id number] Date of Birth: 02/06/1950 Referring Provider: Ninfa Linden  Encounter Date: 10/03/2016      PT End of Session - 10/03/16 0936    Visit Number 4   Date for PT Re-Evaluation 11/20/16   PT Start Time 0910   PT Stop Time 1005   PT Time Calculation (min) 55 min      Past Medical History:  Diagnosis Date  . Arthritis   . Cataract   . Chronic constipation   . Diverticulitis   . Diverticulosis   . Fatty liver   . GERD (gastroesophageal reflux disease)   . Hemorrhoids, internal, with bleeding Gr 2 prolapsed 06/30/2011   Found on colonoscopy 2011 and anoscopy May 2013   . Hepatitis    "B"    no rx >10 yrs  . Hiatal hernia   . High cholesterol   . Hypertension   . Internal hemorrhoids   . OSA (obstructive sleep apnea)   . PUD (peptic ulcer disease)   . Rectal ulceration 04/27/2009   Associated reactive/regenerative changes and fibromuscular extensions into lamina propria/Mucosal Prolapse  . Sleep apnea    Waiting on CPAP machine    Past Surgical History:  Procedure Laterality Date  . ABDOMINAL HYSTERECTOMY    . COLONOSCOPY  04/27/09    POLYPOID FRAGMENT OF COLONIC MUCOSA WITH SURFACE  . HEMORRHOID BANDING    . TOTAL KNEE ARTHROPLASTY Left 08/29/2016   Procedure: LEFT TOTAL KNEE ARTHROPLASTY;  Surgeon: Mcarthur Rossetti, MD;  Location: [facility];  Service: Orthopedics;  Laterality: Left;  . TUBAL LIGATION      There were no vitals filed for this visit.      Subjective Assessment - 10/03/16 0907    Subjective alittle sore with movement   Currently in Pain? Yes   Pain Score 2    Pain Location Knee   Pain Orientation Left            [facility] PT Assessment - 10/03/16 0001      AROM   AROM Assessment Site Knee   Right/Left Knee Left   Left Knee Extension 5   Left Knee Flexion 104                     [facility] Adult PT Treatment/Exercise - 10/03/16 0001      Knee/Hip Exercises: Aerobic   Recumbent Bike 6 min   Nustep level 4 x 6 minutes  LE only     Knee/Hip Exercises: Machines for Strengthening   Cybex Knee Extension 5lb 2x10   Cybex Knee Flexion 20# 2 sets 15   Cybex Leg Press 30lb 2x10, LLE only no weight x10  30# calf raises 2 sets 10     Knee/Hip Exercises: Standing   Other Standing Knee Exercises 3# HS curl 2 sets 10     Knee/Hip Exercises: Seated   Long Arc Quad Strengthening;Left;2 sets;10 reps;Weights  hold 3 sec at [employer] 3 lbs.     Vasopneumatic   Number Minutes Vasopneumatic  15 minutes   Vasopnuematic Location  Knee   Vasopneumatic Pressure Medium                  PT Short Term Goals - 10/03/16 0051  PT SHORT TERM GOAL #1   Title independent with initial HEP   Baseline doing ex from HHPT   Status Achieved           PT Long Term Goals - 10/03/16 0933      PT LONG TERM GOAL #1   Title decrease pain 50%   Status On-going     PT LONG TERM GOAL #2   Title walk without device and minimal deviation   Status Partially Met     PT LONG TERM GOAL #3   Title increase AROM to 0-110 degrees flexion   Baseline 5-104   Status On-going     PT LONG TERM GOAL #4   Title decrease swelling 50%   Status Partially Met               Plan - 10/03/16 0936    Clinical Impression Statement STG met as pt verb doing HEP from HHPT. Excellent increase in ROM and pt amb some without AD, some decreased heel strike and lack of full ext with heel strike.  Progressing with LTGs   PT Treatment/Interventions Cryotherapy;Electrical Stimulation;Gait training;Stair training;Functional mobility training;Patient/family education;Neuromuscular re-education;Balance training;Therapeutic exercise;Therapeutic activities;Manual  techniques;Vasopneumatic Device   PT Next Visit Plan Left LE ROM/strength and gait      Patient will benefit from skilled therapeutic intervention in order to improve the following deficits and impairments:  Abnormal gait, Decreased activity tolerance, Decreased balance, Decreased mobility, Decreased strength, Increased edema, Pain, Decreased endurance, Difficulty walking, Decreased range of motion  Visit Diagnosis: Acute pain of left knee  Difficulty in walking, not elsewhere classified  Stiffness of left knee, not elsewhere classified  Localized swelling, mass and lump, left lower limb     Problem List Patient Active Problem List   Diagnosis Date Noted  . Status post total knee replacement, left 08/29/2016  . Unilateral primary osteoarthritis, left knee 07/17/2016  . Acute pain of left knee 06/28/2016  . Baker's cyst of knee, left - suspected 05/30/2016  . Diverticulitis of colon 03/17/2016  . Hemorrhoids, internal, with bleeding Gr 2 prolapsed 06/30/2011  . Diverticulitis 06/06/2011  . Rectal bleeding 04/19/2009  . ABDOMINAL PAIN, RIGHT LOWER QUADRANT 04/19/2009  . HYPERLIPIDEMIA 07/11/2006  . HYPERTENSION, ESSENTIAL NOS 07/11/2006  . GERD 07/11/2006  . HEPATITIS B, HX OF 07/11/2006    Briget Shaheed,ANGIE PTA 10/03/2016, 9:42 AM  [place] [address] [place], [place], [address] Phone: [phone]   Fax:  [phone]  Name: Julie Dougherty MRN: [id number] Date of Birth: September 23, 1950

## 2016-10-05 ENCOUNTER — Ambulatory Visit: Payer: Medicare Other | Admitting: Physical Therapy

## 2016-10-05 ENCOUNTER — Encounter: Payer: Self-pay | Admitting: Physical Therapy

## 2016-10-05 DIAGNOSIS — R2242 Localized swelling, mass and lump, left lower limb: Secondary | ICD-10-CM | POA: Diagnosis not present

## 2016-10-05 DIAGNOSIS — R262 Difficulty in walking, not elsewhere classified: Secondary | ICD-10-CM

## 2016-10-05 DIAGNOSIS — M25662 Stiffness of left knee, not elsewhere classified: Secondary | ICD-10-CM | POA: Diagnosis not present

## 2016-10-05 DIAGNOSIS — M25562 Pain in left knee: Secondary | ICD-10-CM

## 2016-10-05 NOTE — Therapy (Signed)
[place], [place], [address] Phone: [phone]   Fax:  [phone]  Physical Therapy Treatment  Patient Details  Name: Julie Dougherty MRN: [id number] Date of Birth: 1950-09-21 Referring Provider: Ninfa Linden  Encounter Date: 10/05/2016      PT End of Session - 10/05/16 1130    Visit Number 5   Date for PT Re-Evaluation 11/20/16   PT Start Time 1100   PT Stop Time 1155   PT Time Calculation (min) 55 min      Past Medical History:  Diagnosis Date  . Arthritis   . Cataract   . Chronic constipation   . Diverticulitis   . Diverticulosis   . Fatty liver   . GERD (gastroesophageal reflux disease)   . Hemorrhoids, internal, with bleeding Gr 2 prolapsed 06/30/2011   Found on colonoscopy 2011 and anoscopy May 2013   . Hepatitis    "B"    no rx >10 yrs  . Hiatal hernia   . High cholesterol   . Hypertension   . Internal hemorrhoids   . OSA (obstructive sleep apnea)   . PUD (peptic ulcer disease)   . Rectal ulceration 04/27/2009   Associated reactive/regenerative changes and fibromuscular extensions into lamina propria/Mucosal Prolapse  . Sleep apnea    Waiting on CPAP machine    Past Surgical History:  Procedure Laterality Date  . ABDOMINAL HYSTERECTOMY    . COLONOSCOPY  04/27/09    POLYPOID FRAGMENT OF COLONIC MUCOSA WITH SURFACE  . HEMORRHOID BANDING    . TOTAL KNEE ARTHROPLASTY Left 08/29/2016   Procedure: LEFT TOTAL KNEE ARTHROPLASTY;  Surgeon: Mcarthur Rossetti, MD;  Location: [facility];  Service: Orthopedics;  Laterality: Left;  . TUBAL LIGATION      There were no vitals filed for this visit.      Subjective Assessment - 10/05/16 1101    Subjective doing better   Patient is accompained by: Family member   Pain Score 2    Pain Location Knee   Pain Orientation Left                         [facility] Adult PT Treatment/Exercise - 10/05/16 0001      Knee/Hip Exercises: Aerobic   Tread Mill push and pull 20 times each   Nustep level 5 x 7 minutes  LE only     Knee/Hip Exercises: Machines for Strengthening   Cybex Knee Extension 5lb 3x10   Cybex Knee Flexion 25# 2 sets 15   Cybex Leg Press 40# 3 sets 10  calf raises 2 sets 15     Knee/Hip Exercises: Standing   Heel Raises 15 reps  toe raises on black bar   Other Standing Knee Exercises ball on wall for TKE 15   Other Standing Knee Exercises TKE green tband 15 times     Vasopneumatic   Number Minutes Vasopneumatic  15 minutes   Vasopnuematic Location  Knee   Vasopneumatic Pressure Medium                  PT Short Term Goals - 10/03/16 0933      PT SHORT TERM GOAL #1   Title independent with initial HEP   Baseline doing ex from [facility] Term Goals - 10/03/16 9924  PT LONG TERM GOAL #1   Title decrease pain 50%   Status On-going     PT LONG TERM GOAL #2   Title walk without device and minimal deviation   Status Partially Met     PT LONG TERM GOAL #3   Title increase AROM to 0-110 degrees flexion   Baseline 5-104   Status On-going     PT LONG TERM GOAL #4   Title decrease swelling 50%   Status Partially Met               Plan - 10/05/16 1130    Clinical Impression Statement focus today session on TKE and quad activation for strength and ROM- cuing needed   PT Treatment/Interventions Cryotherapy;Electrical Stimulation;Gait training;Stair training;Functional mobility training;Patient/family education;Neuromuscular re-education;Balance training;Therapeutic exercise;Therapeutic activities;Manual techniques;Vasopneumatic Device   PT Next Visit Plan Left LE ROM/strength and gait      Patient will benefit from skilled therapeutic intervention in order to improve the following deficits and impairments:  Abnormal gait, Decreased activity tolerance, Decreased balance, Decreased mobility, Decreased strength,  Increased edema, Pain, Decreased endurance, Difficulty walking, Decreased range of motion  Visit Diagnosis: Acute pain of left knee  Difficulty in walking, not elsewhere classified  Stiffness of left knee, not elsewhere classified  Localized swelling, mass and lump, left lower limb     Problem List Patient Active Problem List   Diagnosis Date Noted  . Status post total knee replacement, left 08/29/2016  . Unilateral primary osteoarthritis, left knee 07/17/2016  . Acute pain of left knee 06/28/2016  . Baker's cyst of knee, left - suspected 05/30/2016  . Diverticulitis of colon 03/17/2016  . Hemorrhoids, internal, with bleeding Gr 2 prolapsed 06/30/2011  . Diverticulitis 06/06/2011  . Rectal bleeding 04/19/2009  . ABDOMINAL PAIN, RIGHT LOWER QUADRANT 04/19/2009  . HYPERLIPIDEMIA 07/11/2006  . HYPERTENSION, ESSENTIAL NOS 07/11/2006  . GERD 07/11/2006  . HEPATITIS B, HX OF 07/11/2006    PAYSEUR,ANGIE PTA 10/05/2016, 11:32 AM  [facility] [address] , [place], [address] Phone: [phone]   Fax:  [phone]  Name: Julie Dougherty MRN: [id number] Date of Birth: 12/28/1950   

## 2016-10-10 ENCOUNTER — Encounter: Payer: Self-pay | Admitting: Physical Therapy

## 2016-10-10 ENCOUNTER — Ambulatory Visit: Payer: Medicare Other | Admitting: Physical Therapy

## 2016-10-10 DIAGNOSIS — M25562 Pain in left knee: Secondary | ICD-10-CM | POA: Diagnosis not present

## 2016-10-10 DIAGNOSIS — M25662 Stiffness of left knee, not elsewhere classified: Secondary | ICD-10-CM | POA: Diagnosis not present

## 2016-10-10 DIAGNOSIS — R262 Difficulty in walking, not elsewhere classified: Secondary | ICD-10-CM | POA: Diagnosis not present

## 2016-10-10 DIAGNOSIS — R2242 Localized swelling, mass and lump, left lower limb: Secondary | ICD-10-CM | POA: Diagnosis not present

## 2016-10-10 NOTE — Therapy (Signed)
[place], [place], [address] Phone: [phone]   Fax:  [phone]  Physical Therapy Treatment  Patient Details  Name: Julie Dougherty MRN: [id number] Date of Birth: May 02, 1950 Referring Provider: Ninfa Linden  Encounter Date: 10/10/2016      PT End of Session - 10/10/16 1138    Visit Number 6   Date for PT Re-Evaluation 11/20/16   PT Start Time 1100   PT Stop Time 1155   PT Time Calculation (min) 55 min   Activity Tolerance Patient tolerated treatment well   Behavior During Therapy [facility] for tasks assessed/performed      Past Medical History:  Diagnosis Date  . Arthritis   . Cataract   . Chronic constipation   . Diverticulitis   . Diverticulosis   . Fatty liver   . GERD (gastroesophageal reflux disease)   . Hemorrhoids, internal, with bleeding Gr 2 prolapsed 06/30/2011   Found on colonoscopy 2011 and anoscopy May 2013   . Hepatitis    "B"    no rx >10 yrs  . Hiatal hernia   . High cholesterol   . Hypertension   . Internal hemorrhoids   . OSA (obstructive sleep apnea)   . PUD (peptic ulcer disease)   . Rectal ulceration 04/27/2009   Associated reactive/regenerative changes and fibromuscular extensions into lamina propria/Mucosal Prolapse  . Sleep apnea    Waiting on CPAP machine    Past Surgical History:  Procedure Laterality Date  . ABDOMINAL HYSTERECTOMY    . COLONOSCOPY  04/27/09    POLYPOID FRAGMENT OF COLONIC MUCOSA WITH SURFACE  . HEMORRHOID BANDING    . TOTAL KNEE ARTHROPLASTY Left 08/29/2016   Procedure: LEFT TOTAL KNEE ARTHROPLASTY;  Surgeon: Mcarthur Rossetti, MD;  Location: [facility];  Service: Orthopedics;  Laterality: Left;  . TUBAL LIGATION      There were no vitals filed for this visit.      Subjective Assessment - 10/10/16 1104    Subjective Pt reports that she is doing fine   Currently in Pain? Yes   Pain Score 2    Pain Location Knee   Pain  Orientation Left                         [facility] Adult PT Treatment/Exercise - 10/10/16 0001      Knee/Hip Exercises: Aerobic   Recumbent Bike 6 min     Knee/Hip Exercises: Machines for Strengthening   Cybex Knee Extension LLE 5lb 2 x10    Cybex Knee Flexion LLE 15lb 2x10    Cybex Leg Press 40lb 2x15     Knee/Hip Exercises: Standing   Heel Raises 15 reps   Lateral Step Up Left;10 reps;Hand Hold: 0;Step Height: 6";2 sets   Forward Step Up 2 sets;Left;Hand Hold: 0;Step Height: 6";10 reps     Vasopneumatic   Number Minutes Vasopneumatic  15 minutes   Vasopnuematic Location  Knee   Vasopneumatic Pressure Medium   Vasopneumatic Temperature  33     Manual Therapy   Manual Therapy Passive ROM   Passive ROM L knee flex and ext                  PT Short Term Goals - 10/03/16 0933      PT SHORT TERM GOAL #1   Title independent with initial HEP   Baseline doing ex from HHPT   Status  Achieved           PT Long Term Goals - 10/03/16 0933      PT LONG TERM GOAL #1   Title decrease pain 50%   Status On-going     PT LONG TERM GOAL #2   Title walk without device and minimal deviation   Status Partially Met     PT LONG TERM GOAL #3   Title increase AROM to 0-110 degrees flexion   Baseline 5-104   Status On-going     PT LONG TERM GOAL #4   Title decrease swelling 50%   Status Partially Met               Plan - 10/10/16 1139    Clinical Impression Statement Completed all exercises well, pt able to progress to SL strengthening with machine level interventions without issue. Pt abel to complete step up with little compensation.   Rehab Potential Good   PT Frequency 2x / week   PT Duration 8 weeks   PT Treatment/Interventions Cryotherapy;Electrical Stimulation;Gait training;Stair training;Functional mobility training;Patient/family education;Neuromuscular re-education;Balance training;Therapeutic exercise;Therapeutic activities;Manual  techniques;Vasopneumatic Device   PT Next Visit Plan Left LE ROM/strength and gait      Patient will benefit from skilled therapeutic intervention in order to improve the following deficits and impairments:  Abnormal gait, Decreased activity tolerance, Decreased balance, Decreased mobility, Decreased strength, Increased edema, Pain, Decreased endurance, Difficulty walking, Decreased range of motion  Visit Diagnosis: Acute pain of left knee  Difficulty in walking, not elsewhere classified  Stiffness of left knee, not elsewhere classified  Localized swelling, mass and lump, left lower limb     Problem List Patient Active Problem List   Diagnosis Date Noted  . Status post total knee replacement, left 08/29/2016  . Unilateral primary osteoarthritis, left knee 07/17/2016  . Acute pain of left knee 06/28/2016  . Baker's cyst of knee, left - suspected 05/30/2016  . Diverticulitis of colon 03/17/2016  . Hemorrhoids, internal, with bleeding Gr 2 prolapsed 06/30/2011  . Diverticulitis 06/06/2011  . Rectal bleeding 04/19/2009  . ABDOMINAL PAIN, RIGHT LOWER QUADRANT 04/19/2009  . HYPERLIPIDEMIA 07/11/2006  . HYPERTENSION, ESSENTIAL NOS 07/11/2006  . GERD 07/11/2006  . HEPATITIS B, HX OF 07/11/2006    Scot Jun, PTA 10/10/2016, 11:41 AM  [place] [address] [place], [place], [address] Phone: [phone]   Fax:  [phone]  Name: kaylana fenstermacher MRN: [id number] Date of Birth: 26-Jun-1950

## 2016-10-12 ENCOUNTER — Ambulatory Visit: Payer: Medicare Other | Admitting: Physical Therapy

## 2016-10-12 DIAGNOSIS — R262 Difficulty in walking, not elsewhere classified: Secondary | ICD-10-CM | POA: Diagnosis not present

## 2016-10-12 DIAGNOSIS — R2242 Localized swelling, mass and lump, left lower limb: Secondary | ICD-10-CM | POA: Diagnosis not present

## 2016-10-12 DIAGNOSIS — M25662 Stiffness of left knee, not elsewhere classified: Secondary | ICD-10-CM | POA: Diagnosis not present

## 2016-10-12 DIAGNOSIS — M25562 Pain in left knee: Secondary | ICD-10-CM

## 2016-10-12 NOTE — Therapy (Signed)
[facility] [address] [place], [place], [address] Phone: [phone]   Fax:  [phone]  Physical Therapy Treatment  Patient Details  Name: Julie Dougherty MRN: [id number] Date of Birth: 04/10/1950 Referring Provider: Blackman  Encounter Date: 10/12/2016      PT End of Session - 10/12/16 1139    Visit Number 7   Date for PT Re-Evaluation 11/20/16   PT Start Time 1100   PT Stop Time 1154   PT Time Calculation (min) 54 min   Activity Tolerance Patient tolerated treatment well   Behavior During Therapy [facility] for tasks assessed/performed      Past Medical History:  Diagnosis Date  . Arthritis   . Cataract   . Chronic constipation   . Diverticulitis   . Diverticulosis   . Fatty liver   . GERD (gastroesophageal reflux disease)   . Hemorrhoids, internal, with bleeding Gr 2 prolapsed 06/30/2011   Found on colonoscopy 2011 and anoscopy May 2013   . Hepatitis    "B"    no rx >10 yrs  . Hiatal hernia   . High cholesterol   . Hypertension   . Internal hemorrhoids   . OSA (obstructive sleep apnea)   . PUD (peptic ulcer disease)   . Rectal ulceration 04/27/2009   Associated reactive/regenerative changes and fibromuscular extensions into lamina propria/Mucosal Prolapse  . Sleep apnea    Waiting on CPAP machine    Past Surgical History:  Procedure Laterality Date  . ABDOMINAL HYSTERECTOMY    . COLONOSCOPY  04/27/09    POLYPOID FRAGMENT OF COLONIC MUCOSA WITH SURFACE  . HEMORRHOID BANDING    . TOTAL KNEE ARTHROPLASTY Left 08/29/2016   Procedure: LEFT TOTAL KNEE ARTHROPLASTY;  Surgeon: Blackman, Christopher Y, MD;  Location[facility];  Service: Orthopedics;  Laterality: Left;  . TUBAL LIGATION      There were no vitals filed for this visit.      Subjective Assessment - 10/12/16 1102    Subjective "Good"   Patient is accompained by: Family member   Currently in Pain? Yes   Pain Score 2    Pain  Location Knee   Pain Orientation Left                         [facility] Adult PT Treatment/Exercise - 10/12/16 0001      Knee/Hip Exercises: Aerobic   Recumbent Bike 6 min     Knee/Hip Exercises: Machines for Strengthening   Cybex Knee Extension LLE 5lb 2 x10    Cybex Knee Flexion LLE 15lb 2x10    Cybex Leg Press 40lb 2x15     Knee/Hip Exercises: Standing   Lateral Step Up Left;10 reps;Hand Hold: 0;Step Height: 6";2 sets   Forward Step Up 2 sets;Left;Hand Hold: 0;Step Height: 6";10 reps   Walking with Sports Cord 30lb 4 way x3     Vasopneumatic   Number Minutes Vasopneumatic  15 minutes   Vasopnuematic Location  Knee   Vasopneumatic Pressure Medium   Vasopneumatic Temperature  33                  PT Short Term Goals - 10/03/16 0933      PT SHORT TERM GOAL #1   Title independent with initial HEP   Baseline doing ex from HHPT   Status Achieved           PT Long Term Goals -   10/12/16 1139      PT LONG TERM GOAL #1   Title decrease pain 50%   Status Partially Met     PT LONG TERM GOAL #2   Title walk without device and minimal deviation   Status Partially Met               Plan - 10/12/16 1140    Clinical Impression Statement Pt continues to do well in therapy, pt has progressed to resisted gait with some instability with side stepping. Demos good strength and ROM with all machine level interventions.   Rehab Potential Good   PT Frequency 2x / week   PT Duration 8 weeks   PT Treatment/Interventions Cryotherapy;Electrical Stimulation;Gait training;Stair training;Functional mobility training;Patient/family education;Neuromuscular re-education;Balance training;Therapeutic exercise;Therapeutic activities;Manual techniques;Vasopneumatic Device   PT Next Visit Plan Left LE ROM/strength and gait      Patient will benefit from skilled therapeutic intervention in order to improve the following deficits and impairments:  Abnormal gait,  Decreased activity tolerance, Decreased balance, Decreased mobility, Decreased strength, Increased edema, Pain, Decreased endurance, Difficulty walking, Decreased range of motion  Visit Diagnosis: Acute pain of left knee  Stiffness of left knee, not elsewhere classified  Localized swelling, mass and lump, left lower limb  Difficulty in walking, not elsewhere classified     Problem List Patient Active Problem List   Diagnosis Date Noted  . Status post total knee replacement, left 08/29/2016  . Unilateral primary osteoarthritis, left knee 07/17/2016  . Acute pain of left knee 06/28/2016  . Baker's cyst of knee, left - suspected 05/30/2016  . Diverticulitis of colon 03/17/2016  . Hemorrhoids, internal, with bleeding Gr 2 prolapsed 06/30/2011  . Diverticulitis 06/06/2011  . Rectal bleeding 04/19/2009  . ABDOMINAL PAIN, RIGHT LOWER QUADRANT 04/19/2009  . HYPERLIPIDEMIA 07/11/2006  . HYPERTENSION, ESSENTIAL NOS 07/11/2006  . GERD 07/11/2006  . HEPATITIS B, HX OF 07/11/2006    Scot Jun, PTA 10/12/2016, 11:41 AM  [place], [place], [address] Phone: [phone]   Fax:  [phone]  Name: gurnoor sloop MRN: [id number] Date of Birth: Jan 21, 1951

## 2016-10-16 ENCOUNTER — Ambulatory Visit (INDEPENDENT_AMBULATORY_CARE_PROVIDER_SITE_OTHER): Payer: Medicare Other | Admitting: Orthopaedic Surgery

## 2016-10-17 ENCOUNTER — Ambulatory Visit: Payer: Medicare Other | Admitting: Physical Therapy

## 2016-10-17 ENCOUNTER — Encounter: Payer: Self-pay | Admitting: Physical Therapy

## 2016-10-17 DIAGNOSIS — M25562 Pain in left knee: Secondary | ICD-10-CM | POA: Diagnosis not present

## 2016-10-17 DIAGNOSIS — R2242 Localized swelling, mass and lump, left lower limb: Secondary | ICD-10-CM | POA: Diagnosis not present

## 2016-10-17 DIAGNOSIS — M25662 Stiffness of left knee, not elsewhere classified: Secondary | ICD-10-CM | POA: Diagnosis not present

## 2016-10-17 DIAGNOSIS — R262 Difficulty in walking, not elsewhere classified: Secondary | ICD-10-CM | POA: Diagnosis not present

## 2016-10-17 NOTE — Therapy (Signed)
[place], [place], [address] Phone: [phone]   Fax:  [phone]  Physical Therapy Treatment  Patient Details  Name: Julie Dougherty MRN: [id number] Date of Birth: 12/03/1950 Referring Provider: Ninfa Linden  Encounter Date: 10/17/2016      PT End of Session - 10/17/16 1053    Visit Number 8   Date for PT Re-Evaluation 11/20/16   PT Start Time 1013   PT Stop Time 1109   PT Time Calculation (min) 56 min   Activity Tolerance Patient tolerated treatment well   Behavior During Therapy [facility] for tasks assessed/performed      Past Medical History:  Diagnosis Date  . Arthritis   . Cataract   . Chronic constipation   . Diverticulitis   . Diverticulosis   . Fatty liver   . GERD (gastroesophageal reflux disease)   . Hemorrhoids, internal, with bleeding Gr 2 prolapsed 06/30/2011   Found on colonoscopy 2011 and anoscopy May 2013   . Hepatitis    "B"    no rx >10 yrs  . Hiatal hernia   . High cholesterol   . Hypertension   . Internal hemorrhoids   . OSA (obstructive sleep apnea)   . PUD (peptic ulcer disease)   . Rectal ulceration 04/27/2009   Associated reactive/regenerative changes and fibromuscular extensions into lamina propria/Mucosal Prolapse  . Sleep apnea    Waiting on CPAP machine    Past Surgical History:  Procedure Laterality Date  . ABDOMINAL HYSTERECTOMY    . COLONOSCOPY  04/27/09    POLYPOID FRAGMENT OF COLONIC MUCOSA WITH SURFACE  . HEMORRHOID BANDING    . TOTAL KNEE ARTHROPLASTY Left 08/29/2016   Procedure: LEFT TOTAL KNEE ARTHROPLASTY;  Surgeon: Mcarthur Rossetti, MD;  Location: [place];  Service: Orthopedics;  Laterality: Left;  . TUBAL LIGATION      There were no vitals filed for this visit.      Subjective Assessment - 10/17/16 1014    Subjective pt reports that she is in a lot of pain due to the weather   Currently in Pain? Yes   Pain Score 3     Pain Location Knee   Pain Orientation Left            [facility] PT Assessment - 10/17/16 0001      AROM   AROM Assessment Site Knee   Right/Left Knee Left   Left Knee Extension 3   Left Knee Flexion 105                     [facility] Adult PT Treatment/Exercise - 10/17/16 0001      Ambulation/Gait   Stairs Yes   Stairs Assistance 5: Supervision   Stair Management Technique One rail Left;Alternating pattern   Number of Stairs 12   Height of Stairs 6   Gait Comments tactile cues to maintain alternating pattern     Knee/Hip Exercises: Aerobic   Recumbent Bike 5 min   Nustep L4 x5 min No UE     Knee/Hip Exercises: Machines for Strengthening   Cybex Leg Press 20lb LLE 2x10      Knee/Hip Exercises: Standing   Hip Flexion Left;2 sets;10 reps;Knee bent   Hip Flexion Limitations 3lb   Lateral Step Up Left;10 reps;Hand Hold: 0;Step Height: 6";2 sets     Knee/Hip Exercises: Seated   Long Arc Quad Strengthening;Left;2 sets;Weights;15 reps   Long [employer]  Weight 3 lbs.   Hamstring Curl Left;2 sets;15 reps   Hamstring Limitations green      Modalities   Modalities Vasopneumatic     Vasopneumatic   Number Minutes Vasopneumatic  15 minutes   Vasopnuematic Location  Knee   Vasopneumatic Pressure Medium   Vasopneumatic Temperature  33     Manual Therapy   Manual Therapy Passive ROM   Passive ROM L knee flex and ext                  PT Short Term Goals - 10/03/16 0933      PT SHORT TERM GOAL #1   Title independent with initial HEP   Baseline doing ex from HHPT   Status Achieved           PT Long Term Goals - 10/17/16 1054      PT LONG TERM GOAL #3   Title increase AROM to 0-110 degrees flexion   Status Partially Met               Plan - 10/17/16 1054    Clinical Impression Statement Pt with a slight improvement with her L knee AROM. Progressed to stair negotiation with 1 rail maintaining an alternating pattern. Pt does demos som LE  quad weakness with eccentric loads while doing the stairs.   Rehab Potential Good   PT Frequency 2x / week   PT Duration 8 weeks   PT Treatment/Interventions Cryotherapy;Electrical Stimulation;Gait training;Stair training;Functional mobility training;Patient/family education;Neuromuscular re-education;Balance training;Therapeutic exercise;Therapeutic activities;Manual techniques;Vasopneumatic Device   PT Next Visit Plan Left LE ROM/strength and gait      Patient will benefit from skilled therapeutic intervention in order to improve the following deficits and impairments:  Abnormal gait, Decreased activity tolerance, Decreased balance, Decreased mobility, Decreased strength, Increased edema, Pain, Decreased endurance, Difficulty walking, Decreased range of motion  Visit Diagnosis: Acute pain of left knee  Stiffness of left knee, not elsewhere classified  Localized swelling, mass and lump, left lower limb  Difficulty in walking, not elsewhere classified     Problem List Patient Active Problem List   Diagnosis Date Noted  . Status post total knee replacement, left 08/29/2016  . Unilateral primary osteoarthritis, left knee 07/17/2016  . Acute pain of left knee 06/28/2016  . Baker's cyst of knee, left - suspected 05/30/2016  . Diverticulitis of colon 03/17/2016  . Hemorrhoids, internal, with bleeding Gr 2 prolapsed 06/30/2011  . Diverticulitis 06/06/2011  . Rectal bleeding 04/19/2009  . ABDOMINAL PAIN, RIGHT LOWER QUADRANT 04/19/2009  . HYPERLIPIDEMIA 07/11/2006  . HYPERTENSION, ESSENTIAL NOS 07/11/2006  . GERD 07/11/2006  . HEPATITIS B, HX OF 07/11/2006    Scot Jun, PTA 10/17/2016, 10:56 AM  [place] [address] [place], [place], [address] Phone: [phone]   Fax:  [phone]  Name: Julie Dougherty MRN: [id number] Date of Birth: Aug 04, 1950

## 2016-10-20 ENCOUNTER — Encounter: Payer: Self-pay | Admitting: Physical Therapy

## 2016-10-20 ENCOUNTER — Ambulatory Visit: Payer: Medicare Other | Admitting: Physical Therapy

## 2016-10-20 DIAGNOSIS — M25562 Pain in left knee: Secondary | ICD-10-CM

## 2016-10-20 DIAGNOSIS — R262 Difficulty in walking, not elsewhere classified: Secondary | ICD-10-CM | POA: Diagnosis not present

## 2016-10-20 DIAGNOSIS — R2242 Localized swelling, mass and lump, left lower limb: Secondary | ICD-10-CM | POA: Diagnosis not present

## 2016-10-20 DIAGNOSIS — M25662 Stiffness of left knee, not elsewhere classified: Secondary | ICD-10-CM | POA: Diagnosis not present

## 2016-10-20 NOTE — Therapy (Signed)
[place], [place], [address] Phone: [phone]   Fax:  [phone]  Physical Therapy Treatment  Patient Details  Name: Julie Dougherty MRN: [id number] Date of Birth: April 18, 1950 Referring Provider: Ninfa Linden  Encounter Date: 10/20/2016      PT End of Session - 10/20/16 1054    Visit Number 9   Date for PT Re-Evaluation 11/20/16   PT Start Time 1022   PT Stop Time 1102   PT Time Calculation (min) 40 min      Past Medical History:  Diagnosis Date  . Arthritis   . Cataract   . Chronic constipation   . Diverticulitis   . Diverticulosis   . Fatty liver   . GERD (gastroesophageal reflux disease)   . Hemorrhoids, internal, with bleeding Gr 2 prolapsed 06/30/2011   Found on colonoscopy 2011 and anoscopy May 2013   . Hepatitis    "B"    no rx >10 yrs  . Hiatal hernia   . High cholesterol   . Hypertension   . Internal hemorrhoids   . OSA (obstructive sleep apnea)   . PUD (peptic ulcer disease)   . Rectal ulceration 04/27/2009   Associated reactive/regenerative changes and fibromuscular extensions into lamina propria/Mucosal Prolapse  . Sleep apnea    Waiting on CPAP machine    Past Surgical History:  Procedure Laterality Date  . ABDOMINAL HYSTERECTOMY    . COLONOSCOPY  04/27/09    POLYPOID FRAGMENT OF COLONIC MUCOSA WITH SURFACE  . HEMORRHOID BANDING    . TOTAL KNEE ARTHROPLASTY Left 08/29/2016   Procedure: LEFT TOTAL KNEE ARTHROPLASTY;  Surgeon: Mcarthur Rossetti, MD;  Location: [facility];  Service: Orthopedics;  Laterality: Left;  . TUBAL LIGATION      There were no vitals filed for this visit.      Subjective Assessment - 10/20/16 1025    Subjective okay- rescheduled to see MD 9/26   Currently in Pain? Yes   Pain Score 2    Pain Location Knee                         [facility] Adult PT Treatment/Exercise - 10/20/16 0001      Knee/Hip Exercises:  Aerobic   Elliptical I 10 , R 6 2 fwd/2 back   Nustep L 5 6 min LE only     Knee/Hip Exercises: Machines for Strengthening   Cybex Knee Extension LLE 5lb 3 x10    Cybex Knee Flexion LLE 15lb 3x10    Cybex Leg Press 40# 2 sets 15  calf raises 40# 2 sets 15     Knee/Hip Exercises: Standing   Forward Step Up 2 sets;10 reps;Hand Hold: 2;Step Height: 6"  up/over/rev   Functional Squat 2 sets;10 reps   Other Standing Knee Exercises 40# cable press down 2 sets 15   Other Standing Knee Exercises green tband hip 3 way 10 times BIL                  PT Short Term Goals - 10/03/16 0933      PT SHORT TERM GOAL #1   Title independent with initial HEP   Baseline doing ex from HHPT   Status Achieved           PT Long Term Goals - 10/20/16 1056      PT LONG TERM GOAL #1   Title decrease pain  50%   Status Partially Met     PT LONG TERM GOAL #2   Title walk without device and minimal deviation   Status Partially Met     PT LONG TERM GOAL #3   Title increase AROM to 0-110 degrees flexion   Status Partially Met     PT LONG TERM GOAL #4   Title decrease swelling 50%   Status Achieved               Plan - 10/20/16 1055    Clinical Impression Statement progressing with strength esp quad, ecc weakness noted. progressing towards goals   PT Treatment/Interventions Cryotherapy;Electrical Stimulation;Gait training;Stair training;Functional mobility training;Patient/family education;Neuromuscular re-education;Balance training;Therapeutic exercise;Therapeutic activities;Manual techniques;Vasopneumatic Device   PT Next Visit Plan WRITE MD NOTE      Patient will benefit from skilled therapeutic intervention in order to improve the following deficits and impairments:  Abnormal gait, Decreased activity tolerance, Decreased balance, Decreased mobility, Decreased strength, Increased edema, Pain, Decreased endurance, Difficulty walking, Decreased range of motion  Visit  Diagnosis: Acute pain of left knee  Stiffness of left knee, not elsewhere classified     Problem List Patient Active Problem List   Diagnosis Date Noted  . Status post total knee replacement, left 08/29/2016  . Unilateral primary osteoarthritis, left knee 07/17/2016  . Acute pain of left knee 06/28/2016  . Baker's cyst of knee, left - suspected 05/30/2016  . Diverticulitis of colon 03/17/2016  . Hemorrhoids, internal, with bleeding Gr 2 prolapsed 06/30/2011  . Diverticulitis 06/06/2011  . Rectal bleeding 04/19/2009  . ABDOMINAL PAIN, RIGHT LOWER QUADRANT 04/19/2009  . HYPERLIPIDEMIA 07/11/2006  . HYPERTENSION, ESSENTIAL NOS 07/11/2006  . GERD 07/11/2006  . HEPATITIS B, HX OF 07/11/2006    PAYSEUR,ANGIE PTA 10/20/2016, 10:57 AM  [place] [address] [place], [place], [address] Phone: [phone]   Fax:  [phone]  Name: Julie Dougherty MRN: [id number] Date of Birth: 15-Dec-1950

## 2016-10-24 ENCOUNTER — Encounter: Payer: Self-pay | Admitting: Physical Therapy

## 2016-10-24 ENCOUNTER — Ambulatory Visit: Payer: Medicare Other | Admitting: Physical Therapy

## 2016-10-24 DIAGNOSIS — R262 Difficulty in walking, not elsewhere classified: Secondary | ICD-10-CM | POA: Diagnosis not present

## 2016-10-24 DIAGNOSIS — R2242 Localized swelling, mass and lump, left lower limb: Secondary | ICD-10-CM | POA: Diagnosis not present

## 2016-10-24 DIAGNOSIS — M25562 Pain in left knee: Secondary | ICD-10-CM | POA: Diagnosis not present

## 2016-10-24 DIAGNOSIS — M25662 Stiffness of left knee, not elsewhere classified: Secondary | ICD-10-CM | POA: Diagnosis not present

## 2016-10-24 NOTE — Therapy (Signed)
[place], [place], [address] Phone: [phone]   Fax:  [phone]  Physical Therapy Treatment  Patient Details  Name: Julie Dougherty MRN: [id number] Date of Birth: 1950/10/20 Referring Provider: Ninfa Linden  Encounter Date: 10/24/2016      PT End of Session - 10/24/16 0933    Visit Number 10   Date for PT Re-Evaluation 11/20/16   PT Start Time 0845   PT Stop Time 0945   PT Time Calculation (min) 60 min   Activity Tolerance Patient tolerated treatment well   Behavior During Therapy [facility] for tasks assessed/performed      Past Medical History:  Diagnosis Date  . Arthritis   . Cataract   . Chronic constipation   . Diverticulitis   . Diverticulosis   . Fatty liver   . GERD (gastroesophageal reflux disease)   . Hemorrhoids, internal, with bleeding Gr 2 prolapsed 06/30/2011   Found on colonoscopy 2011 and anoscopy May 2013   . Hepatitis    "B"    no rx >10 yrs  . Hiatal hernia   . High cholesterol   . Hypertension   . Internal hemorrhoids   . OSA (obstructive sleep apnea)   . PUD (peptic ulcer disease)   . Rectal ulceration 04/27/2009   Associated reactive/regenerative changes and fibromuscular extensions into lamina propria/Mucosal Prolapse  . Sleep apnea    Waiting on CPAP machine    Past Surgical History:  Procedure Laterality Date  . ABDOMINAL HYSTERECTOMY    . COLONOSCOPY  04/27/09    POLYPOID FRAGMENT OF COLONIC MUCOSA WITH SURFACE  . HEMORRHOID BANDING    . TOTAL KNEE ARTHROPLASTY Left 08/29/2016   Procedure: LEFT TOTAL KNEE ARTHROPLASTY;  Surgeon: Mcarthur Rossetti, MD;  Location: [facility];  Service: Orthopedics;  Laterality: Left;  . TUBAL LIGATION      There were no vitals filed for this visit.      Subjective Assessment - 10/24/16 0849    Subjective Pt reports that she is doing fine   Patient is accompained by: Family member   Currently in Pain? Yes    Pain Score 1    Pain Location Knee   Pain Orientation Left            [facility] PT Assessment - 10/24/16 0001      AROM   AROM Assessment Site Knee   Right/Left Knee Left   Left Knee Extension 3   Left Knee Flexion 105                     [facility] Adult PT Treatment/Exercise - 10/24/16 0001      Ambulation/Gait   Ambulation/Gait Yes   Ambulation/Gait Assistance 5: Supervision;6: Modified independent (Device/Increase time)   Ambulation Distance (Feet) --  ~400   Assistive device None   Gait Pattern Decreased hip/knee flexion - left   Ambulation Surface Paved;Outdoor;Indoor;Unlevel   Stairs Yes   Stairs Assistance 5: Supervision   Stair Management Technique One rail Left;Alternating pattern   Number of Stairs 12   Height of Stairs 6   Gait Comments 3 flights slithe circumcuction with LLE when going up stairs, Gait outside up and down slope.      Knee/Hip Exercises: Aerobic   Nustep L 5 6 min LE only     Knee/Hip Exercises: Machines for Strengthening   Cybex Leg Press 40lb 2x15, red tband TKE  Knee/Hip Exercises: Standing   Lateral Step Up Left;10 reps;Hand Hold: 0;Step Height: 6";2 sets  explosive     Modalities   Modalities Vasopneumatic     Vasopneumatic   Number Minutes Vasopneumatic  15 minutes   Vasopnuematic Location  Knee   Vasopneumatic Pressure Medium   Vasopneumatic Temperature  33                  PT Short Term Goals - 10/03/16 0933      PT SHORT TERM GOAL #1   Title independent with initial HEP   Baseline doing ex from HHPT   Status Achieved           PT Long Term Goals - 10/20/16 1056      PT LONG TERM GOAL #1   Title decrease pain 50%   Status Partially Met     PT LONG TERM GOAL #2   Title walk without device and minimal deviation   Status Partially Met     PT LONG TERM GOAL #3   Title increase AROM to 0-110 degrees flexion   Status Partially Met     PT LONG TERM GOAL #4   Title decrease swelling 50%    Status Achieved               Plan - 2016/10/31 0934    Clinical Impression Statement Progress to more stair negotiation and outdoor ambulation. Pt amb with a mild limp, decrease TKE with LLE during down hill ambulation. Cues given for pacing, pt tends to move through the exercises really fast.   Rehab Potential Good   PT Frequency 2x / week   PT Duration 8 weeks   PT Treatment/Interventions Cryotherapy;Electrical Stimulation;Gait training;Stair training;Functional mobility training;Patient/family education;Neuromuscular re-education;Balance training;Therapeutic exercise;Therapeutic activities;Manual techniques;Vasopneumatic Device   PT Next Visit Plan Get report form MD      Patient will benefit from skilled therapeutic intervention in order to improve the following deficits and impairments:  Abnormal gait, Decreased activity tolerance, Decreased balance, Decreased mobility, Decreased strength, Increased edema, Pain, Decreased endurance, Difficulty walking, Decreased range of motion  Visit Diagnosis: Acute pain of left knee  Stiffness of left knee, not elsewhere classified  Localized swelling, mass and lump, left lower limb  Difficulty in walking, not elsewhere classified       G-Codes - 10/31/2016 7124    Functional Assessment Tool Used (Outpatient Only) foto 34% limitation   Functional Limitation Mobility: Walking and moving around   Mobility: Walking and Moving Around Current Status [phone]) At least 20 percent but less than 40 percent impaired, limited or restricted   Mobility: Walking and Moving Around Goal Status [phone]) At least 40 percent but less than 60 percent impaired, limited or restricted      Problem List Patient Active Problem List   Diagnosis Date Noted  . Status post total knee replacement, left 08/29/2016  . Unilateral primary osteoarthritis, left knee 07/17/2016  . Acute pain of left knee 06/28/2016  . Baker's cyst of knee, left - suspected  05/30/2016  . Diverticulitis of colon 03/17/2016  . Hemorrhoids, internal, with bleeding Gr 2 prolapsed 06/30/2011  . Diverticulitis 06/06/2011  . Rectal bleeding 04/19/2009  . ABDOMINAL PAIN, RIGHT LOWER QUADRANT 04/19/2009  . HYPERLIPIDEMIA 07/11/2006  . HYPERTENSION, ESSENTIAL NOS 07/11/2006  . GERD 07/11/2006  . HEPATITIS B, HX OF 07/11/2006    Scot Jun, PTA 10/31/16, 9:39 AM  [place] [phone]  [place], [place], [address] Phone: [phone]   Fax:  [phone]  Name: celes dedic MRN: [id number] Date of Birth: 10/31/1950

## 2016-10-25 ENCOUNTER — Ambulatory Visit (INDEPENDENT_AMBULATORY_CARE_PROVIDER_SITE_OTHER): Payer: Medicare Other | Admitting: Physician Assistant

## 2016-10-25 DIAGNOSIS — Z96652 Presence of left artificial knee joint: Secondary | ICD-10-CM

## 2016-10-25 MED ORDER — TRAMADOL HCL 50 MG PO TABS: 50.0000 mg | ORAL_TABLET | Freq: Four times a day (QID) | ORAL | 0 refills | Status: DC | PRN

## 2016-10-25 NOTE — Progress Notes (Signed)
Office Visit Note   Patient: Julie Dougherty           Date of Birth: 10/19/1950           MRN: [id number] Visit Date: 10/25/2016              Requested by: No referring provider defined for this encounter. PCP: Rubbie Battiest, NP   Assessment & Plan: Visit Diagnoses:  1. Status post total left knee replacement     Plan: Continue to work with physical therapy for range of motion strengthening and home exercise program for the left knee. Scar tissue mobilization and the mederma recommended. Follow up with [place] at 1 year postop sooner if there is any questions or concerns. Explained to her that she may have some swelling in the knee for 6 months to a year. At 1 year postop we'll obtain AP and lateral views of the left knee.  Follow-Up Instructions: Return in about 10 months (around 08/24/2017) for Radiographs.   Orders:  No orders of the defined types were placed in this encounter.  Meds ordered this encounter  Medications  . traMADol (ULTRAM) 50 MG tablet    Sig: Take 1 tablet (50 mg total) by mouth every 6 (six) hours as needed.    Dispense:  30 tablet    Refill:  0      Procedures: No procedures performed   Clinical Data: No additional findings.   Subjective: No chief complaint on file.   HPI [name] returns toda follow-up Status post left total knee arthroplasty now 57 days. She's overall doing well. She is asking for something for pain. She's had no fevers chills shortness breath calf pain. Review of Systems   Objective: Vital Signs: There were no vitals taken for this visit.  Physical Exam  Constitutional: She is oriented to person, place, and time. She appears well-developed and well-nourished. No distress.  Neurological: She is alert and oriented to person, place, and time.  Skin: She is not diaphoretic.  Psychiatric: She has a normal mood and affect.    Ortho Exam Left knee full  Extension and flexion to 110 degrees. No  instability with valgus varus stressing ,left calf supple nontender surgical incisions healing well positive keloid formation. Specialty Comments:  No specialty comments available.  Imaging: No results found.   PMFS History: Patient Active Problem List   Diagnosis Date Noted  . Status post total knee replacement, left 08/29/2016  . Unilateral primary osteoarthritis, left knee 07/17/2016  . Acute pain of left knee 06/28/2016  . Baker's cyst of knee, left - suspected 05/30/2016  . Diverticulitis of colon 03/17/2016  . Hemorrhoids, internal, with bleeding Gr 2 prolapsed 06/30/2011  . Diverticulitis 06/06/2011  . Rectal bleeding 04/19/2009  . ABDOMINAL PAIN, RIGHT LOWER QUADRANT 04/19/2009  . HYPERLIPIDEMIA 07/11/2006  . HYPERTENSION, ESSENTIAL NOS 07/11/2006  . GERD 07/11/2006  . HEPATITIS B, HX OF 07/11/2006   Past Medical History:  Diagnosis Date  . Arthritis   . Cataract   . Chronic constipation   . Diverticulitis   . Diverticulosis   . Fatty liver   . GERD (gastroesophageal reflux disease)   . Hemorrhoids, internal, with bleeding Gr 2 prolapsed 06/30/2011   Found on colonoscopy 2011 and anoscopy May 2013   . Hepatitis    "B"    no rx >10 yrs  . Hiatal hernia   . High cholesterol   . Hypertension   . Internal hemorrhoids   .  OSA (obstructive sleep apnea)   . PUD (peptic ulcer disease)   . Rectal ulceration 04/27/2009   Associated reactive/regenerative changes and fibromuscular extensions into lamina propria/Mucosal Prolapse  . Sleep apnea    Waiting on CPAP machine    Family History  Problem Relation Age of Onset  . Diabetes Sister   . Colon cancer Neg Hx     Past Surgical History:  Procedure Laterality Date  . ABDOMINAL HYSTERECTOMY    . COLONOSCOPY  04/27/09    POLYPOID FRAGMENT OF COLONIC MUCOSA WITH SURFACE  . HEMORRHOID BANDING    . TOTAL KNEE ARTHROPLASTY Left 08/29/2016   Procedure: LEFT TOTAL KNEE ARTHROPLASTY;  Surgeon: Mcarthur Rossetti, MD;   Location: [facility];  Service: Orthopedics;  Laterality: Left;  . TUBAL LIGATION     Social History   Occupational History  . Not on file.   Social History Main Topics  . Smoking status: Former Smoker    Quit date: 08/22/1996  . Smokeless tobacco: Never Used     Comment: "when she was young"  . Alcohol use No  . Drug use: No  . Sexual activity: Not on file

## 2016-10-26 ENCOUNTER — Encounter: Payer: Self-pay | Admitting: Physical Therapy

## 2016-10-26 ENCOUNTER — Ambulatory Visit: Payer: Medicare Other | Admitting: Physical Therapy

## 2016-10-26 DIAGNOSIS — R2242 Localized swelling, mass and lump, left lower limb: Secondary | ICD-10-CM | POA: Diagnosis not present

## 2016-10-26 DIAGNOSIS — M25662 Stiffness of left knee, not elsewhere classified: Secondary | ICD-10-CM | POA: Diagnosis not present

## 2016-10-26 DIAGNOSIS — M25562 Pain in left knee: Secondary | ICD-10-CM | POA: Diagnosis not present

## 2016-10-26 DIAGNOSIS — R262 Difficulty in walking, not elsewhere classified: Secondary | ICD-10-CM

## 2016-10-26 NOTE — Therapy (Signed)
[place], [place], [address] Phone: [phone]   Fax:  [phone]  Physical Therapy Treatment  Patient Details  Name: Julie Dougherty MRN: [id number] Date of Birth: 1950-11-12 Referring Provider: Ninfa Linden  Encounter Date: 10/26/2016      PT End of Session - 10/26/16 0842    Visit Number 11   Date for PT Re-Evaluation 11/20/16   PT Start Time 0800   PT Stop Time 0843   PT Time Calculation (min) 43 min   Activity Tolerance Patient tolerated treatment well   Behavior During Therapy [facility] for tasks assessed/performed      Past Medical History:  Diagnosis Date  . Arthritis   . Cataract   . Chronic constipation   . Diverticulitis   . Diverticulosis   . Fatty liver   . GERD (gastroesophageal reflux disease)   . Hemorrhoids, internal, with bleeding Gr 2 prolapsed 06/30/2011   Found on colonoscopy 2011 and anoscopy May 2013   . Hepatitis    "B"    no rx >10 yrs  . Hiatal hernia   . High cholesterol   . Hypertension   . Internal hemorrhoids   . OSA (obstructive sleep apnea)   . PUD (peptic ulcer disease)   . Rectal ulceration 04/27/2009   Associated reactive/regenerative changes and fibromuscular extensions into lamina propria/Mucosal Prolapse  . Sleep apnea    Waiting on CPAP machine    Past Surgical History:  Procedure Laterality Date  . ABDOMINAL HYSTERECTOMY    . COLONOSCOPY  04/27/09    POLYPOID FRAGMENT OF COLONIC MUCOSA WITH SURFACE  . HEMORRHOID BANDING    . TOTAL KNEE ARTHROPLASTY Left 08/29/2016   Procedure: LEFT TOTAL KNEE ARTHROPLASTY;  Surgeon: Mcarthur Rossetti, MD;  Location: [place];  Service: Orthopedics;  Laterality: Left;  . TUBAL LIGATION      There were no vitals filed for this visit.      Subjective Assessment - 10/26/16 0801    Subjective "Very good, a little pain"   Currently in Pain? Yes   Pain Score 1    Pain Location Knee   Pain  Orientation Left                         [facility] Adult PT Treatment/Exercise - 10/26/16 0001      Knee/Hip Exercises: Aerobic   Elliptical I 7 , Rv3 1.5 fwd/1.5 back   Recumbent Bike 4 min   Nustep L 4 4 min LE only     Knee/Hip Exercises: Machines for Strengthening   Cybex Knee Extension LLE 5lb 3 x10    Cybex Knee Flexion LLE 15lb 3x10    Cybex Leg Press 20lb 2x10 LLE only      Knee/Hip Exercises: Standing   Walking with Sports Cord 40lb 4 way x3     Knee/Hip Exercises: Seated   Sit to Sand 15 reps;without UE support;2 sets                  PT Short Term Goals - 10/03/16 0933      PT SHORT TERM GOAL #1   Title independent with initial HEP   Baseline doing ex from HHPT   Status Achieved           PT Long Term Goals - 10/20/16 1056      PT LONG TERM GOAL #1   Title decrease pain 50%  Status Partially Met     PT LONG TERM GOAL #2   Title walk without device and minimal deviation   Status Partially Met     PT LONG TERM GOAL #3   Title increase AROM to 0-110 degrees flexion   Status Partially Met     PT LONG TERM GOAL #4   Title decrease swelling 50%   Status Achieved               Plan - 10/26/16 0843    Clinical Impression Statement Pt is progressing well, reports seeing MD yesterday and he was very pleased. Pt still walks with a mild limp at times. Good strength and ROM with all exercises.    Rehab Potential Good   PT Frequency 2x / week   PT Duration 8 weeks   PT Treatment/Interventions Cryotherapy;Electrical Stimulation;Gait training;Stair training;Functional mobility training;Patient/family education;Neuromuscular re-education;Balance training;Therapeutic exercise;Therapeutic activities;Manual techniques;Vasopneumatic Device   PT Next Visit Plan Continue to progress towards D/C      Patient will benefit from skilled therapeutic intervention in order to improve the following deficits and impairments:  Abnormal gait,  Decreased activity tolerance, Decreased balance, Decreased mobility, Decreased strength, Increased edema, Pain, Decreased endurance, Difficulty walking, Decreased range of motion  Visit Diagnosis: Acute pain of left knee  Stiffness of left knee, not elsewhere classified  Localized swelling, mass and lump, left lower limb  Difficulty in walking, not elsewhere classified     Problem List Patient Active Problem List   Diagnosis Date Noted  . Status post total knee replacement, left 08/29/2016  . Unilateral primary osteoarthritis, left knee 07/17/2016  . Acute pain of left knee 06/28/2016  . Baker's cyst of knee, left - suspected 05/30/2016  . Diverticulitis of colon 03/17/2016  . Hemorrhoids, internal, with bleeding Gr 2 prolapsed 06/30/2011  . Diverticulitis 06/06/2011  . Rectal bleeding 04/19/2009  . ABDOMINAL PAIN, RIGHT LOWER QUADRANT 04/19/2009  . HYPERLIPIDEMIA 07/11/2006  . HYPERTENSION, ESSENTIAL NOS 07/11/2006  . GERD 07/11/2006  . HEPATITIS B, HX OF 07/11/2006    Scot Jun, PTA 10/26/2016, 8:44 AM  [place] [address] [place], [place], [address] Phone: [phone]   Fax:  [phone]  Name: Julie Dougherty MRN: [id number] Date of Birth: Mar 12, 1950

## 2016-10-30 ENCOUNTER — Ambulatory Visit: Payer: Medicare Other | Attending: Orthopaedic Surgery | Admitting: Physical Therapy

## 2016-10-30 ENCOUNTER — Encounter: Payer: Self-pay | Admitting: Physical Therapy

## 2016-10-30 DIAGNOSIS — R2242 Localized swelling, mass and lump, left lower limb: Secondary | ICD-10-CM

## 2016-10-30 DIAGNOSIS — R262 Difficulty in walking, not elsewhere classified: Secondary | ICD-10-CM | POA: Diagnosis not present

## 2016-10-30 DIAGNOSIS — M25562 Pain in left knee: Secondary | ICD-10-CM

## 2016-10-30 DIAGNOSIS — M25662 Stiffness of left knee, not elsewhere classified: Secondary | ICD-10-CM | POA: Insufficient documentation

## 2016-10-30 NOTE — Therapy (Signed)
[place], [place], [address] Phone: [phone]   Fax:  [phone]  Physical Therapy Treatment  Patient Details  Name: Julie Dougherty MRN: [id number] Date of Birth: [birth year] Referring Provider: Ninfa Linden  Encounter Date: 10/30/2016      PT End of Session - 10/30/16 7902    Visit Number 12   Date for PT Re-Evaluation 11/20/16   PT Start Time 4097   PT Stop Time 1230   PT Time Calculation (min) 45 min   Activity Tolerance Patient tolerated treatment well   Behavior During Therapy [facility] for tasks assessed/performed      Past Medical History:  Diagnosis Date  . Arthritis   . Cataract   . Chronic constipation   . Diverticulitis   . Diverticulosis   . Fatty liver   . GERD (gastroesophageal reflux disease)   . Hemorrhoids, internal, with bleeding Gr 2 prolapsed 06/30/2011   Found on colonoscopy 2011 and anoscopy May 2013   . Hepatitis    "B"    no rx >10 yrs  . Hiatal hernia   . High cholesterol   . Hypertension   . Internal hemorrhoids   . OSA (obstructive sleep apnea)   . PUD (peptic ulcer disease)   . Rectal ulceration 04/27/2009   Associated reactive/regenerative changes and fibromuscular extensions into lamina propria/Mucosal Prolapse  . Sleep apnea    Waiting on CPAP machine    Past Surgical History:  Procedure Laterality Date  . ABDOMINAL HYSTERECTOMY    . COLONOSCOPY  04/27/09    POLYPOID FRAGMENT OF COLONIC MUCOSA WITH SURFACE  . HEMORRHOID BANDING    . TOTAL KNEE ARTHROPLASTY Left 08/29/2016   Procedure: LEFT TOTAL KNEE ARTHROPLASTY;  Surgeon: Mcarthur Rossetti, MD;  Location: [place];  Service: Orthopedics;  Laterality: Left;  . TUBAL LIGATION      There were no vitals filed for this visit.      Subjective Assessment - 10/30/16 1147    Subjective "Good"   Currently in Pain? Yes   Pain Score 1                          [facility] Adult  PT Treatment/Exercise - 10/30/16 0001      Ambulation/Gait   Ambulation/Gait Yes   Ambulation/Gait Assistance 5: Supervision;6: Modified independent (Device/Increase time)   Ambulation Distance (Feet) --  >600   Assistive device None   Gait Pattern Decreased hip/knee flexion - left   Ambulation Surface Level;Unlevel;Indoor;Outdoor;Paved   Stairs Yes   Stairs Assistance 5: Supervision   Stair Management Technique One rail Left;Alternating pattern   Number of Stairs 24   Height of Stairs 6   Gait Comments 2 flights some L circumcuction with LLE when going up stairs, Gait outside up and down slope.      Knee/Hip Exercises: Aerobic   Elliptical I 7 , R5 2 fwd/2 back   Nustep L 5 4 min LE only     Knee/Hip Exercises: Machines for Strengthening   Cybex Knee Extension LLE 5lb  2x10    Cybex Knee Flexion LLE 15lb 2x15    Cybex Leg Press 40lb 2x15     Knee/Hip Exercises: Standing   Forward Step Up 2 sets;10 reps;Step Height: 6";Hand Hold: 0                  PT Short Term Goals - 10/03/16 3532  PT SHORT TERM GOAL #1   Title independent with initial HEP   Baseline doing ex from HHPT   Status Achieved           PT Long Term Goals - 10/20/16 1056      PT LONG TERM GOAL #1   Title decrease pain 50%   Status Partially Met     PT LONG TERM GOAL #2   Title walk without device and minimal deviation   Status Partially Met     PT LONG TERM GOAL #3   Title increase AROM to 0-110 degrees flexion   Status Partially Met     PT LONG TERM GOAL #4   Title decrease swelling 50%   Status Achieved               Plan - 10/30/16 1228    Clinical Impression Statement No issues with today's interventions, she does reports little L knee pain when descending stairs.   Rehab Potential Good   PT Frequency 2x / week   PT Duration 8 weeks   PT Next Visit Plan D/C next visit      Patient will benefit from skilled therapeutic intervention in order to improve the  following deficits and impairments:  Abnormal gait, Decreased activity tolerance, Decreased balance, Decreased mobility, Decreased strength, Increased edema, Pain, Decreased endurance, Difficulty walking, Decreased range of motion  Visit Diagnosis: Stiffness of left knee, not elsewhere classified  Acute pain of left knee  Localized swelling, mass and lump, left lower limb     Problem List Patient Active Problem List   Diagnosis Date Noted  . Status post total knee replacement, left 08/29/2016  . Unilateral primary osteoarthritis, left knee 07/17/2016  . Acute pain of left knee 06/28/2016  . Baker's cyst of knee, left - suspected 05/30/2016  . Diverticulitis of colon 03/17/2016  . Hemorrhoids, internal, with bleeding Gr 2 prolapsed 06/30/2011  . Diverticulitis 06/06/2011  . Rectal bleeding 04/19/2009  . ABDOMINAL PAIN, RIGHT LOWER QUADRANT 04/19/2009  . HYPERLIPIDEMIA 07/11/2006  . HYPERTENSION, ESSENTIAL NOS 07/11/2006  . GERD 07/11/2006  . HEPATITIS B, HX OF 07/11/2006    Scot Jun, PTA 10/30/2016, 12:29 PM  [place] [address] [place], [place], [address] Phone: [phone]   Fax:  [phone]  Name: Julie Dougherty MRN: [id number] Date of Birth: August 23, 1950

## 2016-11-01 ENCOUNTER — Ambulatory Visit: Payer: Medicare Other | Admitting: Physical Therapy

## 2016-11-01 ENCOUNTER — Encounter: Payer: Self-pay | Admitting: Physical Therapy

## 2016-11-01 DIAGNOSIS — M25562 Pain in left knee: Secondary | ICD-10-CM

## 2016-11-01 DIAGNOSIS — R262 Difficulty in walking, not elsewhere classified: Secondary | ICD-10-CM | POA: Diagnosis not present

## 2016-11-01 DIAGNOSIS — R2242 Localized swelling, mass and lump, left lower limb: Secondary | ICD-10-CM

## 2016-11-01 DIAGNOSIS — M25662 Stiffness of left knee, not elsewhere classified: Secondary | ICD-10-CM

## 2016-11-01 NOTE — Therapy (Signed)
[facility] [address] [place], , [address] Phone: [phone]   Fax:  [phone]  Physical Therapy Treatment  Patient Details  Name: Julie Dougherty MRN: [id number] Date of Birth: 01/15/1951 Referring Provider: Blackman  Encounter Date: 11/01/2016      PT End of Session - 11/01/16 1010    Visit Number 13   Date for PT Re-Evaluation 11/20/16   PT Start Time 0930   PT Stop Time 1010   PT Time Calculation (min) 40 min   Activity Tolerance Patient tolerated treatment well   Behavior During Therapy [facility] for tasks assessed/performed      Past Medical History:  Diagnosis Date  . Arthritis   . Cataract   . Chronic constipation   . Diverticulitis   . Diverticulosis   . Fatty liver   . GERD (gastroesophageal reflux disease)   . Hemorrhoids, internal, with bleeding Gr 2 prolapsed 06/30/2011   Found on colonoscopy 2011 and anoscopy May 2013   . Hepatitis    "B"    no rx >10 yrs  . Hiatal hernia   . High cholesterol   . Hypertension   . Internal hemorrhoids   . OSA (obstructive sleep apnea)   . PUD (peptic ulcer disease)   . Rectal ulceration 04/27/2009   Associated reactive/regenerative changes and fibromuscular extensions into lamina propria/Mucosal Prolapse  . Sleep apnea    Waiting on CPAP machine    Past Surgical History:  Procedure Laterality Date  . ABDOMINAL HYSTERECTOMY    . COLONOSCOPY  04/27/09    POLYPOID FRAGMENT OF COLONIC MUCOSA WITH SURFACE  . HEMORRHOID BANDING    . TOTAL KNEE ARTHROPLASTY Left 08/29/2016   Procedure: LEFT TOTAL KNEE ARTHROPLASTY;  Surgeon: Blackman, Christopher Y, MD;  Location[facility];  Service: Orthopedics;  Laterality: Left;  . TUBAL LIGATION      There were no vitals filed for this visit.      Subjective Assessment - 11/01/16 0932    Subjective "good, good"   Currently in Pain? Yes   Pain Score 1    Pain Location Knee   Pain Orientation Left             [facility] PT Assessment - 11/01/16 0001      AROM   AROM Assessment Site Knee   Right/Left Knee Left   Left Knee Extension 2   Left Knee Flexion 113                     [facility] Adult PT Treatment/Exercise - 11/01/16 0001      Knee/Hip Exercises: Aerobic   Recumbent Bike 4 min   Nustep L 5 4 min LE only     Knee/Hip Exercises: Machines for Strengthening   Cybex Knee Extension LLE 5lb  2x10    Cybex Knee Flexion LLE 15lb 2x15    Cybex Leg Press 40lb 2x15; LLE 20lv 2x10      Knee/Hip Exercises: Standing   Heel Raises 15 reps;2 seconds;2 sets   Walking with Sports Cord 40lb side step x5 each                  PT Short Term Goals - 10/03/16 0933      PT SHORT TERM GOAL #1   Title independent with initial HEP   Baseline doing ex from HHPT   Status Achieved           PT   Long Term Goals - 11/01/16 0946      PT LONG TERM GOAL #3   Title increase AROM to 0-110 degrees flexion   Status Partially Met               Plan - 11/01/16 1010    Clinical Impression Statement Most goals met, pt lack 2 degrees of L knee ext. Pt reports no functional limitations.   Rehab Potential Good   PT Frequency 2x / week   PT Duration 8 weeks   PT Treatment/Interventions Cryotherapy;Electrical Stimulation;Gait training;Stair training;Functional mobility training;Patient/family education;Neuromuscular re-education;Balance training;Therapeutic exercise;Therapeutic activities;Manual techniques;Vasopneumatic Device   PT Next Visit Plan D/C PT      Patient will benefit from skilled therapeutic intervention in order to improve the following deficits and impairments:  Abnormal gait, Decreased activity tolerance, Decreased balance, Decreased mobility, Decreased strength, Increased edema, Pain, Decreased endurance, Difficulty walking, Decreased range of motion  Visit Diagnosis: Stiffness of left knee, not elsewhere classified  Acute pain of left  knee  Localized swelling, mass and lump, left lower limb  Difficulty in walking, not elsewhere classified     Problem List Patient Active Problem List   Diagnosis Date Noted  . Status post total knee replacement, left 08/29/2016  . Unilateral primary osteoarthritis, left knee 07/17/2016  . Acute pain of left knee 06/28/2016  . Baker's cyst of knee, left - suspected 05/30/2016  . Diverticulitis of colon 03/17/2016  . Hemorrhoids, internal, with bleeding Gr 2 prolapsed 06/30/2011  . Diverticulitis 06/06/2011  . Rectal bleeding 04/19/2009  . ABDOMINAL PAIN, RIGHT LOWER QUADRANT 04/19/2009  . HYPERLIPIDEMIA 07/11/2006  . HYPERTENSION, ESSENTIAL NOS 07/11/2006  . GERD 07/11/2006  . HEPATITIS B, HX OF 07/11/2006   PHYSICAL THERAPY DISCHARGE SUMMARY  Visits from Start of Care: 13 Plan: Patient agrees to discharge.  Patient goals were met. Patient is being discharged due to being pleased with the current functional level.  ?????      Scot Jun, PTA 11/01/2016, 10:12 AM  [address] [place], [place], [address] Phone: [phone]   Fax:  [phone]  Name: Julie Dougherty MRN: [id number] Date of Birth: 09/23/1950

## 2016-12-26 ENCOUNTER — Encounter (INDEPENDENT_AMBULATORY_CARE_PROVIDER_SITE_OTHER): Payer: Self-pay | Admitting: Orthopaedic Surgery

## 2016-12-26 ENCOUNTER — Ambulatory Visit (INDEPENDENT_AMBULATORY_CARE_PROVIDER_SITE_OTHER): Payer: Medicare Other | Admitting: Physician Assistant

## 2016-12-26 ENCOUNTER — Ambulatory Visit (INDEPENDENT_AMBULATORY_CARE_PROVIDER_SITE_OTHER): Payer: Medicare Other

## 2016-12-26 DIAGNOSIS — M25561 Pain in right knee: Secondary | ICD-10-CM | POA: Diagnosis not present

## 2016-12-26 MED ORDER — METHYLPREDNISOLONE ACETATE 40 MG/ML IJ SUSP
40.0000 mg | INTRAMUSCULAR | Status: AC | PRN
  Administered 2016-12-26: 40 mg via INTRA_ARTICULAR

## 2016-12-26 MED ORDER — LIDOCAINE HCL 1 % IJ SOLN
3.0000 mL | INTRAMUSCULAR | Status: AC | PRN
  Administered 2016-12-26: 3 mL

## 2016-12-26 NOTE — Progress Notes (Signed)
Office Visit Note   Patient: Julie Dougherty           Date of Birth: 1950/10/12           MRN: [id number] Visit Date: 12/26/2016              Requested by: Rubbie Battiest, [place], [place] [address] PCP: Rubbie Battiest, NP   Assessment & Plan: Visit Diagnoses:  1. Acute pain of right knee     Plan: Have her follow with [place] in 2 weeks check progress lack of.  Recommended ice.  Quad strengthening.  Pain persist despite conservative treatments recommend MRI to rule out meniscal tear.  Follow-Up Instructions: Return in about 2 weeks (around 01/09/2017).   Orders:  Orders Placed This Encounter  Procedures  . Large Joint Inj  . XR Knee 1-2 Views Right   No orders of the defined types were placed in this encounter.     Procedures: Large Joint Inj on 12/26/2016 9:44 AM Indications: pain Details: 22 G 1.5 in needle, anterolateral approach  Arthrogram: No  Medications: 3 mL lidocaine 1 %; 40 mg methylPREDNISolone acetate 40 MG/ML Outcome: tolerated well, no immediate complications Procedure, treatment alternatives, risks and benefits explained, specific risks discussed. Consent was given by the patient. Immediately prior to procedure a time out was called to verify the correct patient, procedure, equipment, support staff and site/side marked as required. Patient was prepped and draped in the usual sterile fashion.       Clinical Data: No additional findings.   Subjective: Chief Complaint  Patient presents with  . Right Knee - Pain    HPI Patient comes in today for new complaint of right knee pain.  She states right knee is been bothering her since September she was riding a bike at PT has had pain after that therapy session.  She is status post a left total knee arthroplasty 08/29/2016.  Left knee overall is doing well.  She notes that she has giving way locking sensation of her knee.  She is tried no medications no  meds just decreased her activities.  She had no known injury to the knee.  Review of Systems Please see HPI otherwise negative  Objective: Vital Signs: There were no vitals taken for this visit.  Physical Exam  Constitutional: She is oriented to person, place, and time. She appears well-developed and well-nourished. No distress.  Pulmonary/Chest: Effort normal.  Neurological: She is alert and oriented to person, place, and time.  Skin: She is not diaphoretic.  Psychiatric: She has a normal mood and affect.    Ortho Exam Left knee good range of motion without pain.  She did perform keloid.  No instability of either knee.  Right knee no effusion abnormal warmth or erythema.  Full range of motion of the right knee.  Positive McMurray's on the right.  Tenderness along the right medial joint line. Lamount Cranker is negative right knee.  Specialty Comments:  No specialty comments available.  Imaging: Xr Knee 1-2 Views Right  Result Date: 12/26/2016 Right knee AP lateral views: No acute fracture.  All 3 compartments overall well-maintained.  AP view shows lateralization of the patella.    PMFS History: Patient Active Problem List   Diagnosis Date Noted  . Status post total knee replacement, left 08/29/2016  . Unilateral primary osteoarthritis, left knee 07/17/2016  . Acute pain of left knee 06/28/2016  . Baker's cyst of knee, left -  suspected 05/30/2016  . Diverticulitis of colon 03/17/2016  . Hemorrhoids, internal, with bleeding Gr 2 prolapsed 06/30/2011  . Diverticulitis 06/06/2011  . Rectal bleeding 04/19/2009  . ABDOMINAL PAIN, RIGHT LOWER QUADRANT 04/19/2009  . HYPERLIPIDEMIA 07/11/2006  . HYPERTENSION, ESSENTIAL NOS 07/11/2006  . GERD 07/11/2006  . HEPATITIS B, HX OF 07/11/2006   Past Medical History:  Diagnosis Date  . Arthritis   . Cataract   . Chronic constipation   . Diverticulitis   . Diverticulosis   . Fatty liver   . GERD (gastroesophageal reflux disease)     . Hemorrhoids, internal, with bleeding Gr 2 prolapsed 06/30/2011   Found on colonoscopy 2011 and anoscopy May 2013   . Hepatitis    "B"    no rx >10 yrs  . Hiatal hernia   . High cholesterol   . Hypertension   . Internal hemorrhoids   . OSA (obstructive sleep apnea)   . PUD (peptic ulcer disease)   . Rectal ulceration 04/27/2009   Associated reactive/regenerative changes and fibromuscular extensions into lamina propria/Mucosal Prolapse  . Sleep apnea    Waiting on CPAP machine    Family History  Problem Relation Age of Onset  . Diabetes Sister   . Colon cancer Neg Hx     Past Surgical History:  Procedure Laterality Date  . ABDOMINAL HYSTERECTOMY    . COLONOSCOPY  04/27/09    POLYPOID FRAGMENT OF COLONIC MUCOSA WITH SURFACE  . HEMORRHOID BANDING    . TOTAL KNEE ARTHROPLASTY Left 08/29/2016   Procedure: LEFT TOTAL KNEE ARTHROPLASTY;  Surgeon: Mcarthur Rossetti, MD;  Location: [place];  Service: Orthopedics;  Laterality: Left;  . TUBAL LIGATION     Social History   Occupational History  . Not on file  Tobacco Use  . Smoking status: Former Smoker    Last attempt to quit: 08/22/1996    Years since quitting: 20.3  . Smokeless tobacco: Never Used  . Tobacco comment: "when she was young"  Substance and Sexual Activity  . Alcohol use: No  . Drug use: No  . Sexual activity: Not on file

## 2017-01-09 ENCOUNTER — Ambulatory Visit (INDEPENDENT_AMBULATORY_CARE_PROVIDER_SITE_OTHER): Payer: Medicare Other | Admitting: Orthopaedic Surgery

## 2017-01-17 ENCOUNTER — Encounter (INDEPENDENT_AMBULATORY_CARE_PROVIDER_SITE_OTHER): Payer: Self-pay | Admitting: Orthopaedic Surgery

## 2017-01-17 ENCOUNTER — Ambulatory Visit (INDEPENDENT_AMBULATORY_CARE_PROVIDER_SITE_OTHER): Payer: Medicare Other | Admitting: Physician Assistant

## 2017-01-17 DIAGNOSIS — M25561 Pain in right knee: Secondary | ICD-10-CM | POA: Diagnosis not present

## 2017-01-17 NOTE — Progress Notes (Signed)
Julie Dougherty returns today follow-up of her right knee pain status post cortisone injection 12/26/2016.  States the knee is still giving way and locking.  She still has pain along medial aspect of the knee.  She is ambulating with a cane.  Review of systems: See HPI otherwise negative  Physical exam: Right knee no instability valgus varus stressing.  She has full extension full flexion.  She has tenderness along medial joint line.  Positive McMurray's.   Impression: Acute right knee pain  Plan: We will send her for an MRI to evaluate for meniscal tear due to the fact that she is failed conservative treatment and continues to have mechanical symptoms.  She will follow-up after the MRI to go over results and discuss further treatment.

## 2017-01-18 ENCOUNTER — Other Ambulatory Visit (INDEPENDENT_AMBULATORY_CARE_PROVIDER_SITE_OTHER): Payer: Self-pay

## 2017-01-18 ENCOUNTER — Ambulatory Visit (INDEPENDENT_AMBULATORY_CARE_PROVIDER_SITE_OTHER): Payer: Medicare Other | Admitting: Physician Assistant

## 2017-01-18 DIAGNOSIS — M25561 Pain in right knee: Principal | ICD-10-CM

## 2017-01-18 DIAGNOSIS — G8929 Other chronic pain: Secondary | ICD-10-CM

## 2017-02-02 ENCOUNTER — Ambulatory Visit
Admission: RE | Admit: 2017-02-02 | Discharge: 2017-02-02 | Disposition: A | Discharge status: Home / Self Care | Payer: Medicare Other | Source: Ambulatory Visit | Attending: Surgery | Admitting: Surgery

## 2017-02-02 DIAGNOSIS — M25561 Pain in right knee: Secondary | ICD-10-CM | POA: Diagnosis not present

## 2017-02-02 DIAGNOSIS — G8929 Other chronic pain: Secondary | ICD-10-CM

## 2017-02-07 ENCOUNTER — Encounter (INDEPENDENT_AMBULATORY_CARE_PROVIDER_SITE_OTHER): Payer: Self-pay | Admitting: Orthopaedic Surgery

## 2017-02-07 ENCOUNTER — Ambulatory Visit (INDEPENDENT_AMBULATORY_CARE_PROVIDER_SITE_OTHER): Payer: Medicare Other | Admitting: Orthopaedic Surgery

## 2017-02-07 DIAGNOSIS — M1711 Unilateral primary osteoarthritis, right knee: Secondary | ICD-10-CM | POA: Diagnosis not present

## 2017-02-07 DIAGNOSIS — Z23 Encounter for immunization: Secondary | ICD-10-CM | POA: Diagnosis not present

## 2017-02-07 NOTE — Progress Notes (Signed)
Office Visit Note   Patient: Julie Dougherty           Date of Birth: 02/01/1950           MRN: [id number] Visit Date: 02/07/2017              Requested by: Rubbie Battiest, [address] [place], [place] [address] PCP: Rubbie Battiest, NP   Assessment & Plan: Visit Diagnoses:  1. Primary osteoarthritis of right knee     Plan: Due to patient's findings on MRI and the fact that she is failed conservative treatment she would like to proceed with a right total knee arthroplasty.  She understands the risk and the postoperative protocol as she underwent a left total knee arthroplasty in July 2018 and is done very well.  Questions were encouraged and answered by Dr. Ninfa Linden myself.  See her back 2 weeks postop  Follow-Up Instructions: Return for 2 weeks postop.   Orders:  No orders of the defined types were placed in this encounter.  No orders of the defined types were placed in this encounter.     Procedures: No procedures performed   Clinical Data: No additional findings.   Subjective: Right knee pain  HPI Ms. [name] returns today to go over the MRI results of her right knee.  She continues to have pain in her right knee and giving way.  She is tried injections home exercises despite this continues to have significant pain that affects her quality of life.  She ambulates with a cane. She underwent a left total knee arthroplasty July 2018 and is doing well with this.  She had no postoperative complications.  MRI right knee dated 02/02/2017 shows a complete tear through the posterior root medial meniscus and an additional horizontal tear of the posterior horn.  Patellofemoral partial thickness cartilage loss of the patella with areas of full-thickness cartilage loss and also partial cartilage loss over the trochlear groove.  Medial compartment with full-thickness cartilage loss over the central weightbearing medial femoral condyle and tibial  plateau. Review of Systems No fevers chills shortness of breath chest pain.  Objective: Vital Signs: There were no vitals taken for this visit.  Physical Exam  Constitutional: She is oriented to person, place, and time. She appears well-developed and well-nourished. No distress.  Pulmonary/Chest: Effort normal.  Neurological: She is alert and oriented to person, place, and time.  Skin: She is not diaphoretic.  Psychiatric: She has a normal mood and affect. Her behavior is normal.    Ortho Exam Left knee full range of motion without pain.  No instability valgus varus stressing.  She did perform keloid over the surgical incision site.  Right knee she has tenderness over the medial joint line.  No abnormal warmth erythema or effusion.  No instability. Specialty Comments:  No specialty comments available.  Imaging: No results found.   PMFS History: Patient Active Problem List   Diagnosis Date Noted  . Status post total knee replacement, left 08/29/2016  . Unilateral primary osteoarthritis, left knee 07/17/2016  . Acute pain of right knee 06/28/2016  . Baker's cyst of knee, left - suspected 05/30/2016  . Diverticulitis of colon 03/17/2016  . Hemorrhoids, internal, with bleeding Gr 2 prolapsed 06/30/2011  . Diverticulitis 06/06/2011  . Rectal bleeding 04/19/2009  . ABDOMINAL PAIN, RIGHT LOWER QUADRANT 04/19/2009  . HYPERLIPIDEMIA 07/11/2006  . HYPERTENSION, ESSENTIAL NOS 07/11/2006  . GERD 07/11/2006  . HEPATITIS B, HX OF 07/11/2006  Past Medical History:  Diagnosis Date  . Arthritis   . Cataract   . Chronic constipation   . Diverticulitis   . Diverticulosis   . Fatty liver   . GERD (gastroesophageal reflux disease)   . Hemorrhoids, internal, with bleeding Gr 2 prolapsed 06/30/2011   Found on colonoscopy 2011 and anoscopy May 2013   . Hepatitis    "B"    no rx >10 yrs  . Hiatal hernia   . High cholesterol   . Hypertension   . Internal hemorrhoids   . OSA  (obstructive sleep apnea)   . PUD (peptic ulcer disease)   . Rectal ulceration 04/27/2009   Associated reactive/regenerative changes and fibromuscular extensions into lamina propria/Mucosal Prolapse  . Sleep apnea    Waiting on CPAP machine    Family History  Problem Relation Age of Onset  . Diabetes Sister   . Colon cancer Neg Hx     Past Surgical History:  Procedure Laterality Date  . ABDOMINAL HYSTERECTOMY    . COLONOSCOPY  04/27/09    POLYPOID FRAGMENT OF COLONIC MUCOSA WITH SURFACE  . HEMORRHOID BANDING    . TOTAL KNEE ARTHROPLASTY Left 08/29/2016   Procedure: LEFT TOTAL KNEE ARTHROPLASTY;  Surgeon: Mcarthur Rossetti, MD;  Location: [facility];  Service: Orthopedics;  Laterality: Left;  . TUBAL LIGATION     Social History   Occupational History  . Not on file  Tobacco Use  . Smoking status: Former Smoker    Last attempt to quit: 08/22/1996    Years since quitting: 20.4  . Smokeless tobacco: Never Used  . Tobacco comment: "when she was young"  Substance and Sexual Activity  . Alcohol use: No  . Drug use: No  . Sexual activity: Not on file

## 2017-02-19 ENCOUNTER — Other Ambulatory Visit (INDEPENDENT_AMBULATORY_CARE_PROVIDER_SITE_OTHER): Payer: Self-pay | Admitting: Physician Assistant

## 2017-02-27 DIAGNOSIS — K219 Gastro-esophageal reflux disease without esophagitis: Secondary | ICD-10-CM | POA: Diagnosis not present

## 2017-02-27 DIAGNOSIS — I10 Essential (primary) hypertension: Secondary | ICD-10-CM | POA: Diagnosis not present

## 2017-02-27 DIAGNOSIS — G4733 Obstructive sleep apnea (adult) (pediatric): Secondary | ICD-10-CM | POA: Diagnosis not present

## 2017-02-27 DIAGNOSIS — E785 Hyperlipidemia, unspecified: Secondary | ICD-10-CM | POA: Diagnosis not present

## 2017-02-27 NOTE — Pre-Procedure Instructions (Signed)
Julie Dougherty  02/27/2017      [facility] [address] [place], [place] - [address] [place] [address] Phone: [phone] Fax: [phone]    Your procedure is scheduled on 03/06/2017.  Report to [facility] Admitting at 1245 A.M.  Call this number if you have problems the morning of surgery:  [phone]   Remember:  Do not eat food or drink liquids after midnight.   Continue all medications as directed by your physician except follow these medication instructions before surgery below   Take these medicines the morning of surgery with A SIP OF WATER: Pantoprazole (Protonix)  7 days prior to surgery STOP taking any Aspirin(unless otherwise instructed by your surgeon), Aleve, Naproxen, Ibuprofen, Motrin, Advil, Goody's, BC's, all herbal medications, fish oil, and all vitamins    Do not wear jewelry, make-up or nail polish.  Do not wear lotions, powders, or perfumes, or deodorant.  Do not shave 48 hours prior to surgery.   Do not bring valuables to the hospital.  [facility] is not responsible for any belongings or valuables.  Hearing aids, eyeglasses, contacts, dentures or bridgework may not be worn into surgery.  Leave your suitcase in the car.  After surgery it may be brought to your room.  For patients admitted to the hospital, discharge time will be determined by your treatment team.  Patients discharged the day of surgery will not be allowed to drive home.   Name and phone number of your driver:    Special instructions:   [place]- Preparing For Surgery  Before surgery, you can play an important role. Because skin is not sterile, your skin needs to be as free of germs as possible. You can reduce the number of germs on your skin by washing with CHG (chlorahexidine gluconate) Soap before surgery.  CHG is an antiseptic cleaner which kills germs and bonds with the skin to continue killing germs even after  washing.  Please do not use if you have an allergy to CHG or antibacterial soaps. If your skin becomes reddened/irritated stop using the CHG.  Do not shave (including legs and underarms) for at least 48 hours prior to first CHG shower. It is OK to shave your face.  Please follow these instructions carefully.   1. Shower the NIGHT BEFORE SURGERY and the MORNING OF SURGERY with CHG.   2. If you chose to wash your hair, wash your hair first as usual with your normal shampoo.  3. After you shampoo, rinse your hair and body thoroughly to remove the shampoo.  4. Use CHG as you would any other liquid soap. You can apply CHG directly to the skin and wash gently with a scrungie or a clean washcloth.   5. Apply the CHG Soap to your body ONLY FROM THE NECK DOWN.  Do not use on open wounds or open sores. Avoid contact with your eyes, ears, mouth and genitals (private parts). Wash Face and genitals (private parts)  with your normal soap.  6. Wash thoroughly, paying special attention to the area where your surgery will be performed.  7. Thoroughly rinse your body with warm water from the neck down.  8. DO NOT shower/wash with your normal soap after using and rinsing off the CHG Soap.  9. Pat yourself dry with a CLEAN TOWEL.  10. Wear CLEAN PAJAMAS to bed the night before surgery, wear comfortable clothes the morning of surgery  11. Place CLEAN SHEETS on your bed the night of your first shower and DO NOT SLEEP WITH PETS.    Day of Surgery: Shower as stated above. Do not apply any deodorants/lotions.  Please wear clean clothes to the hospital/surgery center.      Please read over the following fact sheets that you were given.

## 2017-02-28 ENCOUNTER — Encounter (HOSPITAL_COMMUNITY): Payer: Self-pay

## 2017-02-28 ENCOUNTER — Other Ambulatory Visit: Payer: Self-pay

## 2017-02-28 ENCOUNTER — Encounter (HOSPITAL_COMMUNITY)
Admission: RE | Admit: 2017-02-28 | Discharge: 2017-02-28 | Disposition: A | Discharge status: Home / Self Care | Payer: Medicare Other | Source: Ambulatory Visit | Attending: Orthopaedic Surgery | Admitting: Orthopaedic Surgery

## 2017-02-28 DIAGNOSIS — Z1231 Encounter for screening mammogram for malignant neoplasm of breast: Secondary | ICD-10-CM | POA: Diagnosis not present

## 2017-02-28 DIAGNOSIS — H5711 Ocular pain, right eye: Secondary | ICD-10-CM | POA: Diagnosis not present

## 2017-02-28 DIAGNOSIS — Z01812 Encounter for preprocedural laboratory examination: Secondary | ICD-10-CM | POA: Diagnosis not present

## 2017-02-28 DIAGNOSIS — I10 Essential (primary) hypertension: Secondary | ICD-10-CM | POA: Diagnosis not present

## 2017-02-28 DIAGNOSIS — H35341 Macular cyst, hole, or pseudohole, right eye: Secondary | ICD-10-CM | POA: Diagnosis not present

## 2017-02-28 DIAGNOSIS — I498 Other specified cardiac arrhythmias: Secondary | ICD-10-CM | POA: Diagnosis not present

## 2017-02-28 DIAGNOSIS — H01022 Squamous blepharitis right lower eyelid: Secondary | ICD-10-CM | POA: Diagnosis not present

## 2017-02-28 DIAGNOSIS — H10413 Chronic giant papillary conjunctivitis, bilateral: Secondary | ICD-10-CM | POA: Diagnosis not present

## 2017-02-28 DIAGNOSIS — Z01810 Encounter for preprocedural cardiovascular examination: Secondary | ICD-10-CM | POA: Diagnosis not present

## 2017-02-28 DIAGNOSIS — H01024 Squamous blepharitis left upper eyelid: Secondary | ICD-10-CM | POA: Diagnosis not present

## 2017-02-28 DIAGNOSIS — H01021 Squamous blepharitis right upper eyelid: Secondary | ICD-10-CM | POA: Diagnosis not present

## 2017-02-28 DIAGNOSIS — H2513 Age-related nuclear cataract, bilateral: Secondary | ICD-10-CM | POA: Diagnosis not present

## 2017-02-28 DIAGNOSIS — H01025 Squamous blepharitis left lower eyelid: Secondary | ICD-10-CM | POA: Diagnosis not present

## 2017-02-28 DIAGNOSIS — H0014 Chalazion left upper eyelid: Secondary | ICD-10-CM | POA: Diagnosis not present

## 2017-02-28 HISTORY — DX: Prediabetes: R73.03

## 2017-02-28 LAB — CBC
HEMATOCRIT: 43.1 % (ref 36.0–46.0)
HEMOGLOBIN: 14.1 g/dL (ref 12.0–15.0)
MCH: 29.1 pg (ref 26.0–34.0)
MCHC: 32.7 g/dL (ref 30.0–36.0)
MCV: 89 fL (ref 78.0–100.0)
Platelets: 254 10*3/uL (ref 150–400)
RBC: 4.84 MIL/uL (ref 3.87–5.11)
RDW: 13.2 % (ref 11.5–15.5)
WBC: 7 10*3/uL (ref 4.0–10.5)

## 2017-02-28 LAB — BASIC METABOLIC PANEL
ANION GAP: 11 (ref 5–15)
BUN: 14 mg/dL (ref 6–20)
CO2: 22 mmol/L (ref 22–32)
Calcium: 9.4 mg/dL (ref 8.9–10.3)
Chloride: 105 mmol/L (ref 101–111)
Creatinine, Ser: 0.87 mg/dL (ref 0.44–1.00)
GFR calc non Af Amer: >60 mL/min (ref >60)
GLUCOSE: 89 mg/dL (ref 65–99)
POTASSIUM: 3.9 mmol/L (ref 3.5–5.1)
Sodium: 138 mmol/L (ref 135–145)

## 2017-02-28 LAB — GLUCOSE, CAPILLARY: Glucose-Capillary: 82 mg/dL (ref 65–99)

## 2017-02-28 LAB — SURGICAL PCR SCREEN
MRSA, PCR: NEGATIVE
Staphylococcus aureus: NEGATIVE

## 2017-02-28 NOTE — Progress Notes (Signed)
PCP - Philippa Sicks at [facility] Cardiologist - patient denies  Chest x-ray - n/a EKG - 02/28/2017 Stress Test - patient denies ECHO - patient denies Cardiac Cath - patient denies  Sleep Study - 04/20/2016 CPAP - patient denies  Anesthesia review: n/a  Patient denies shortness of breath, fever, cough and chest pain at PAT appointment   Patient verbalized understanding of instructions that were given to them at the PAT appointment. Patient was also instructed that they will need to review over the PAT instructions again at home before surgery.

## 2017-03-05 MED ORDER — TRANEXAMIC ACID 1000 MG/10ML IV SOLN
1000.0000 mg | INTRAVENOUS | Status: AC
  Administered 2017-03-06: 1000 mg via INTRAVENOUS
  Filled 2017-03-05: qty 1100

## 2017-03-06 ENCOUNTER — Inpatient Hospital Stay (HOSPITAL_COMMUNITY): Payer: Medicare Other

## 2017-03-06 ENCOUNTER — Encounter (HOSPITAL_COMMUNITY)
Admission: RE | Disposition: A | Discharge status: Home Health | Payer: Self-pay | Source: Ambulatory Visit | Attending: Orthopaedic Surgery

## 2017-03-06 ENCOUNTER — Inpatient Hospital Stay (HOSPITAL_COMMUNITY): Payer: Medicare Other | Admitting: Anesthesiology

## 2017-03-06 ENCOUNTER — Inpatient Hospital Stay (HOSPITAL_COMMUNITY)
Admission: RE | Admit: 2017-03-06 | Discharge: 2017-03-08 | DRG: 470 | Disposition: A | Discharge status: Home Health | Payer: Medicare Other | Source: Ambulatory Visit | Attending: Orthopaedic Surgery | Admitting: Orthopaedic Surgery

## 2017-03-06 ENCOUNTER — Encounter (HOSPITAL_COMMUNITY): Payer: Self-pay

## 2017-03-06 DIAGNOSIS — I10 Essential (primary) hypertension: Secondary | ICD-10-CM | POA: Diagnosis present

## 2017-03-06 DIAGNOSIS — K760 Fatty (change of) liver, not elsewhere classified: Secondary | ICD-10-CM | POA: Diagnosis present

## 2017-03-06 DIAGNOSIS — Z96652 Presence of left artificial knee joint: Secondary | ICD-10-CM | POA: Diagnosis present

## 2017-03-06 DIAGNOSIS — Z87891 Personal history of nicotine dependence: Secondary | ICD-10-CM | POA: Diagnosis not present

## 2017-03-06 DIAGNOSIS — K5909 Other constipation: Secondary | ICD-10-CM | POA: Diagnosis present

## 2017-03-06 DIAGNOSIS — K219 Gastro-esophageal reflux disease without esophagitis: Secondary | ICD-10-CM | POA: Diagnosis present

## 2017-03-06 DIAGNOSIS — Z888 Allergy status to other drugs, medicaments and biological substances status: Secondary | ICD-10-CM | POA: Diagnosis not present

## 2017-03-06 DIAGNOSIS — Z881 Allergy status to other antibiotic agents status: Secondary | ICD-10-CM

## 2017-03-06 DIAGNOSIS — M659 Synovitis and tenosynovitis, unspecified: Secondary | ICD-10-CM | POA: Diagnosis present

## 2017-03-06 DIAGNOSIS — K449 Diaphragmatic hernia without obstruction or gangrene: Secondary | ICD-10-CM | POA: Diagnosis present

## 2017-03-06 DIAGNOSIS — Z833 Family history of diabetes mellitus: Secondary | ICD-10-CM

## 2017-03-06 DIAGNOSIS — G4733 Obstructive sleep apnea (adult) (pediatric): Secondary | ICD-10-CM | POA: Diagnosis present

## 2017-03-06 DIAGNOSIS — R7303 Prediabetes: Secondary | ICD-10-CM | POA: Diagnosis present

## 2017-03-06 DIAGNOSIS — M1711 Unilateral primary osteoarthritis, right knee: Principal | ICD-10-CM

## 2017-03-06 DIAGNOSIS — Z96651 Presence of right artificial knee joint: Secondary | ICD-10-CM

## 2017-03-06 DIAGNOSIS — M25561 Pain in right knee: Secondary | ICD-10-CM | POA: Diagnosis not present

## 2017-03-06 DIAGNOSIS — E7800 Pure hypercholesterolemia, unspecified: Secondary | ICD-10-CM | POA: Diagnosis present

## 2017-03-06 DIAGNOSIS — Z471 Aftercare following joint replacement surgery: Secondary | ICD-10-CM | POA: Diagnosis not present

## 2017-03-06 DIAGNOSIS — G8918 Other acute postprocedural pain: Secondary | ICD-10-CM | POA: Diagnosis not present

## 2017-03-06 DIAGNOSIS — M25761 Osteophyte, right knee: Secondary | ICD-10-CM | POA: Diagnosis present

## 2017-03-06 HISTORY — PX: TOTAL KNEE ARTHROPLASTY: SHX125

## 2017-03-06 SURGERY — ARTHROPLASTY, KNEE, TOTAL
Anesthesia: Monitor Anesthesia Care | Site: Knee | Laterality: Right

## 2017-03-06 MED ORDER — FENTANYL CITRATE (PF) 100 MCG/2ML IJ SOLN
INTRAMUSCULAR | Status: AC
  Administered 2017-03-06: 50 ug via INTRAVENOUS
  Filled 2017-03-06: qty 2

## 2017-03-06 MED ORDER — DIPHENHYDRAMINE HCL 12.5 MG/5ML PO ELIX
12.5000 mg | ORAL_SOLUTION | ORAL | Status: DC | PRN
Start: 2017-03-06 — End: 2017-03-08

## 2017-03-06 MED ORDER — SODIUM CHLORIDE 0.9 % IR SOLN
Status: DC | PRN
  Administered 2017-03-06: 3000 mL

## 2017-03-06 MED ORDER — PRAVASTATIN SODIUM 20 MG PO TABS
20.0000 mg | ORAL_TABLET | Freq: Every day | ORAL | Status: DC
  Administered 2017-03-07: 20 mg via ORAL
  Filled 2017-03-06: qty 1

## 2017-03-06 MED ORDER — PROPOFOL 500 MG/50ML IV EMUL
INTRAVENOUS | Status: DC | PRN
  Administered 2017-03-06: 25 ug/kg/min via INTRAVENOUS

## 2017-03-06 MED ORDER — DOCUSATE SODIUM 100 MG PO CAPS
100.0000 mg | ORAL_CAPSULE | Freq: Two times a day (BID) | ORAL | Status: DC
  Administered 2017-03-07 – 2017-03-08 (×3): 100 mg via ORAL
  Filled 2017-03-06 (×3): qty 1

## 2017-03-06 MED ORDER — MIDAZOLAM HCL 2 MG/2ML IJ SOLN
INTRAMUSCULAR | Status: AC
  Filled 2017-03-06: qty 2

## 2017-03-06 MED ORDER — DIPHENHYDRAMINE HCL 50 MG/ML IJ SOLN
INTRAMUSCULAR | Status: AC
  Filled 2017-03-06: qty 1

## 2017-03-06 MED ORDER — FUROSEMIDE 20 MG PO TABS
20.0000 mg | ORAL_TABLET | Freq: Every day | ORAL | Status: DC
  Administered 2017-03-07 – 2017-03-08 (×2): 20 mg via ORAL
  Filled 2017-03-06 (×2): qty 1

## 2017-03-06 MED ORDER — SODIUM CHLORIDE 0.9 % IJ SOLN
INTRAMUSCULAR | Status: DC | PRN
  Administered 2017-03-06: 20 mL via INTRAVENOUS

## 2017-03-06 MED ORDER — ONDANSETRON HCL 4 MG/2ML IJ SOLN: 4.0000 mg | Freq: Four times a day (QID) | INTRAMUSCULAR | Status: DC | PRN

## 2017-03-06 MED ORDER — FENTANYL CITRATE (PF) 100 MCG/2ML IJ SOLN: 25.0000 ug | INTRAMUSCULAR | Status: DC | PRN

## 2017-03-06 MED ORDER — AMLODIPINE BESYLATE 5 MG PO TABS
5.0000 mg | ORAL_TABLET | Freq: Every day | ORAL | Status: DC
  Administered 2017-03-07 – 2017-03-08 (×2): 5 mg via ORAL
  Filled 2017-03-06 (×2): qty 1

## 2017-03-06 MED ORDER — MIDAZOLAM HCL 2 MG/2ML IJ SOLN
2.0000 mg | Freq: Once | INTRAMUSCULAR | Status: AC
  Administered 2017-03-06: 2 mg via INTRAVENOUS

## 2017-03-06 MED ORDER — METOCLOPRAMIDE HCL 5 MG/ML IJ SOLN: 5.0000 mg | Freq: Three times a day (TID) | INTRAMUSCULAR | Status: DC | PRN

## 2017-03-06 MED ORDER — CHLORHEXIDINE GLUCONATE 4 % EX LIQD: 60.0000 mL | Freq: Once | CUTANEOUS | Status: DC

## 2017-03-06 MED ORDER — OXYCODONE HCL 5 MG PO TABS: 10.0000 mg | ORAL_TABLET | ORAL | Status: DC | PRN

## 2017-03-06 MED ORDER — METOCLOPRAMIDE HCL 5 MG PO TABS: 5.0000 mg | ORAL_TABLET | Freq: Three times a day (TID) | ORAL | Status: DC | PRN

## 2017-03-06 MED ORDER — DIPHENHYDRAMINE HCL 50 MG/ML IJ SOLN
INTRAMUSCULAR | Status: DC | PRN
  Administered 2017-03-06: 12.5 mg via INTRAVENOUS

## 2017-03-06 MED ORDER — PROPOFOL 10 MG/ML IV BOLUS
INTRAVENOUS | Status: AC
  Filled 2017-03-06: qty 20

## 2017-03-06 MED ORDER — PHENYLEPHRINE HCL 10 MG/ML IJ SOLN
INTRAMUSCULAR | Status: DC | PRN
  Administered 2017-03-06 (×4): 80 ug via INTRAVENOUS

## 2017-03-06 MED ORDER — BENAZEPRIL HCL 20 MG PO TABS
20.0000 mg | ORAL_TABLET | Freq: Every day | ORAL | Status: DC
  Administered 2017-03-07 – 2017-03-08 (×2): 20 mg via ORAL
  Filled 2017-03-06 (×2): qty 1

## 2017-03-06 MED ORDER — BUPIVACAINE IN DEXTROSE 0.75-8.25 % IT SOLN
INTRATHECAL | Status: DC | PRN
  Administered 2017-03-06: 1.8 mL via INTRATHECAL

## 2017-03-06 MED ORDER — DEXAMETHASONE SODIUM PHOSPHATE 10 MG/ML IJ SOLN
INTRAMUSCULAR | Status: AC
  Filled 2017-03-06: qty 1

## 2017-03-06 MED ORDER — MENTHOL 3 MG MT LOZG: 1.0000 | LOZENGE | OROMUCOSAL | Status: DC | PRN

## 2017-03-06 MED ORDER — FENTANYL CITRATE (PF) 100 MCG/2ML IJ SOLN
50.0000 ug | Freq: Once | INTRAMUSCULAR | Status: AC
  Administered 2017-03-06: 50 ug via INTRAVENOUS

## 2017-03-06 MED ORDER — FENTANYL CITRATE (PF) 250 MCG/5ML IJ SOLN
INTRAMUSCULAR | Status: DC | PRN
  Administered 2017-03-06: 50 ug via INTRAVENOUS

## 2017-03-06 MED ORDER — PANTOPRAZOLE SODIUM 40 MG PO TBEC
40.0000 mg | DELAYED_RELEASE_TABLET | Freq: Every day | ORAL | Status: DC
  Administered 2017-03-07 – 2017-03-08 (×2): 40 mg via ORAL
  Filled 2017-03-06 (×2): qty 1

## 2017-03-06 MED ORDER — BUPIVACAINE HCL (PF) 0.25 % IJ SOLN
INTRAMUSCULAR | Status: AC
  Filled 2017-03-06: qty 30

## 2017-03-06 MED ORDER — LIDOCAINE HCL (CARDIAC) 20 MG/ML IV SOLN
INTRAVENOUS | Status: DC | PRN
  Administered 2017-03-06: 40 mg via INTRATRACHEAL

## 2017-03-06 MED ORDER — CEFAZOLIN SODIUM-DEXTROSE 2-4 GM/100ML-% IV SOLN
2.0000 g | INTRAVENOUS | Status: AC
  Administered 2017-03-06: 2 g via INTRAVENOUS

## 2017-03-06 MED ORDER — HYDROCODONE-ACETAMINOPHEN 5-325 MG PO TABS
1.0000 | ORAL_TABLET | ORAL | Status: DC | PRN
  Administered 2017-03-07 – 2017-03-08 (×2): 2 via ORAL
  Filled 2017-03-06 (×2): qty 2

## 2017-03-06 MED ORDER — METHOCARBAMOL 500 MG PO TABS
500.0000 mg | ORAL_TABLET | Freq: Four times a day (QID) | ORAL | Status: DC | PRN
  Administered 2017-03-07 – 2017-03-08 (×2): 500 mg via ORAL
  Filled 2017-03-06 (×2): qty 1

## 2017-03-06 MED ORDER — BUPIVACAINE HCL 0.25 % IJ SOLN
INTRAMUSCULAR | Status: DC | PRN
  Administered 2017-03-06: 30 mL

## 2017-03-06 MED ORDER — BUPIVACAINE LIPOSOME 1.3 % IJ SUSP
20.0000 mL | INTRAMUSCULAR | Status: AC
Start: 2017-03-06 — End: 2017-03-06
  Administered 2017-03-06: 20 mL
  Filled 2017-03-06: qty 20

## 2017-03-06 MED ORDER — ALUM & MAG HYDROXIDE-SIMETH 200-200-20 MG/5ML PO SUSP: 30.0000 mL | ORAL | Status: DC | PRN

## 2017-03-06 MED ORDER — CEFAZOLIN SODIUM-DEXTROSE 2-4 GM/100ML-% IV SOLN
INTRAVENOUS | Status: AC
  Filled 2017-03-06: qty 100

## 2017-03-06 MED ORDER — FENTANYL CITRATE (PF) 250 MCG/5ML IJ SOLN
INTRAMUSCULAR | Status: AC
  Filled 2017-03-06: qty 5

## 2017-03-06 MED ORDER — ONDANSETRON HCL 4 MG PO TABS: 4.0000 mg | ORAL_TABLET | Freq: Four times a day (QID) | ORAL | Status: DC | PRN

## 2017-03-06 MED ORDER — OXYCODONE HCL 5 MG/5ML PO SOLN: 5.0000 mg | Freq: Once | ORAL | Status: DC | PRN

## 2017-03-06 MED ORDER — ONDANSETRON HCL 4 MG/2ML IJ SOLN
INTRAMUSCULAR | Status: AC
  Filled 2017-03-06: qty 2

## 2017-03-06 MED ORDER — ROPIVACAINE HCL 7.5 MG/ML IJ SOLN
INTRAMUSCULAR | Status: DC | PRN
  Administered 2017-03-06: 20 mL via PERINEURAL

## 2017-03-06 MED ORDER — ACETAMINOPHEN 650 MG RE SUPP: 650.0000 mg | RECTAL | Status: DC | PRN

## 2017-03-06 MED ORDER — ASPIRIN EC 325 MG PO TBEC
325.0000 mg | DELAYED_RELEASE_TABLET | Freq: Two times a day (BID) | ORAL | Status: DC
  Administered 2017-03-07 – 2017-03-08 (×3): 325 mg via ORAL
  Filled 2017-03-06 (×3): qty 1

## 2017-03-06 MED ORDER — POLYETHYLENE GLYCOL 3350 17 G PO PACK: 17.0000 g | PACK | Freq: Every day | ORAL | Status: DC | PRN

## 2017-03-06 MED ORDER — HYDROMORPHONE HCL 1 MG/ML IJ SOLN
1.0000 mg | INTRAMUSCULAR | Status: DC | PRN
  Administered 2017-03-06 – 2017-03-07 (×5): 1 mg via INTRAVENOUS
  Filled 2017-03-06 (×5): qty 1

## 2017-03-06 MED ORDER — VITAMIN C 500 MG PO TABS
1000.0000 mg | ORAL_TABLET | Freq: Every day | ORAL | Status: DC
  Administered 2017-03-07 – 2017-03-08 (×2): 1000 mg via ORAL
  Filled 2017-03-06 (×2): qty 2

## 2017-03-06 MED ORDER — 0.9 % SODIUM CHLORIDE (POUR BTL) OPTIME
TOPICAL | Status: DC | PRN
  Administered 2017-03-06: 1000 mL

## 2017-03-06 MED ORDER — METHOCARBAMOL 1000 MG/10ML IJ SOLN: 500.0000 mg | Freq: Four times a day (QID) | INTRAVENOUS | Status: DC | PRN

## 2017-03-06 MED ORDER — EPHEDRINE SULFATE 50 MG/ML IJ SOLN
INTRAMUSCULAR | Status: DC | PRN
  Administered 2017-03-06: 5 mg via INTRAVENOUS

## 2017-03-06 MED ORDER — ACETAMINOPHEN 325 MG PO TABS: 650.0000 mg | ORAL_TABLET | ORAL | Status: DC | PRN

## 2017-03-06 MED ORDER — SODIUM CHLORIDE 0.9 % IV SOLN
INTRAVENOUS | Status: DC
  Administered 2017-03-06: 18:00:00 via INTRAVENOUS

## 2017-03-06 MED ORDER — PHENOL 1.4 % MT LIQD: 1.0000 | OROMUCOSAL | Status: DC | PRN

## 2017-03-06 MED ORDER — CEFAZOLIN SODIUM-DEXTROSE 1-4 GM/50ML-% IV SOLN
1.0000 g | Freq: Three times a day (TID) | INTRAVENOUS | Status: AC
  Administered 2017-03-06 – 2017-03-07 (×2): 1 g via INTRAVENOUS
  Filled 2017-03-06 (×3): qty 50

## 2017-03-06 MED ORDER — LIDOCAINE 2% (20 MG/ML) 5 ML SYRINGE
INTRAMUSCULAR | Status: AC
  Filled 2017-03-06: qty 5

## 2017-03-06 MED ORDER — MIDAZOLAM HCL 5 MG/5ML IJ SOLN
INTRAMUSCULAR | Status: DC | PRN
  Administered 2017-03-06: 1 mg via INTRAVENOUS

## 2017-03-06 MED ORDER — ROCURONIUM BROMIDE 10 MG/ML (PF) SYRINGE
PREFILLED_SYRINGE | INTRAVENOUS | Status: AC
  Filled 2017-03-06: qty 5

## 2017-03-06 MED ORDER — OXYCODONE HCL 5 MG PO TABS: 5.0000 mg | ORAL_TABLET | Freq: Once | ORAL | Status: DC | PRN

## 2017-03-06 MED ORDER — LACTATED RINGERS IV SOLN
INTRAVENOUS | Status: DC
  Administered 2017-03-06 (×2): via INTRAVENOUS

## 2017-03-06 MED ORDER — AMLODIPINE BESY-BENAZEPRIL HCL 5-20 MG PO CAPS: 1.0000 | ORAL_CAPSULE | Freq: Every day | ORAL | Status: DC

## 2017-03-06 MED ORDER — MIDAZOLAM HCL 2 MG/2ML IJ SOLN
INTRAMUSCULAR | Status: AC
  Administered 2017-03-06: 2 mg via INTRAVENOUS
  Filled 2017-03-06: qty 2

## 2017-03-06 SURGICAL SUPPLY — 61 items
BANDAGE ACE 6X5 VEL STRL LF (GAUZE/BANDAGES/DRESSINGS) ×4 IMPLANT
BANDAGE ESMARK 6X9 LF (GAUZE/BANDAGES/DRESSINGS) ×1 IMPLANT
BLADE SAG 18X100X1.27 (BLADE) ×3 IMPLANT
BNDG CMPR 9X6 STRL LF SNTH (GAUZE/BANDAGES/DRESSINGS) ×1
BNDG ESMARK 6X9 LF (GAUZE/BANDAGES/DRESSINGS) ×3
BOWL SMART MIX CTS (DISPOSABLE) ×3 IMPLANT
CAPT KNEE TRIATH TK-4 ×2 IMPLANT
CLOSURE WOUND 1/2 X4 (GAUZE/BANDAGES/DRESSINGS) ×1
COVER SURGICAL LIGHT HANDLE (MISCELLANEOUS) ×3 IMPLANT
CUFF TOURNIQUET SINGLE 34IN LL (TOURNIQUET CUFF) ×3 IMPLANT
CUFF TOURNIQUET SINGLE 44IN (TOURNIQUET CUFF) IMPLANT
DRAPE EXTREMITY T 121X128X90 (DRAPE) ×3 IMPLANT
DRAPE HALF SHEET 40X57 (DRAPES) ×3 IMPLANT
DRAPE U-SHAPE 47X51 STRL (DRAPES) ×3 IMPLANT
DRSG PAD ABDOMINAL 8X10 ST (GAUZE/BANDAGES/DRESSINGS) ×3 IMPLANT
DURAPREP 26ML APPLICATOR (WOUND CARE) ×3 IMPLANT
ELECT CAUTERY BLADE 6.4 (BLADE) ×3 IMPLANT
ELECT REM PT RETURN 9FT ADLT (ELECTROSURGICAL) ×3
ELECTRODE REM PT RTRN 9FT ADLT (ELECTROSURGICAL) ×1 IMPLANT
FACESHIELD WRAPAROUND (MASK) ×6 IMPLANT
FACESHIELD WRAPAROUND OR TEAM (MASK) ×2 IMPLANT
GAUZE SPONGE 4X4 12PLY STRL (GAUZE/BANDAGES/DRESSINGS) ×3 IMPLANT
GAUZE XEROFORM 1X8 LF (GAUZE/BANDAGES/DRESSINGS) ×3 IMPLANT
GLOVE BIOGEL PI IND STRL 8 (GLOVE) ×2 IMPLANT
GLOVE BIOGEL PI INDICATOR 8 (GLOVE) ×4
GLOVE ORTHO TXT STRL SZ7.5 (GLOVE) ×3 IMPLANT
GLOVE SURG ORTHO 8.0 STRL STRW (GLOVE) ×3 IMPLANT
GOWN STRL REUS W/ TWL LRG LVL3 (GOWN DISPOSABLE) IMPLANT
GOWN STRL REUS W/ TWL XL LVL3 (GOWN DISPOSABLE) ×2 IMPLANT
GOWN STRL REUS W/TWL LRG LVL3 (GOWN DISPOSABLE)
GOWN STRL REUS W/TWL XL LVL3 (GOWN DISPOSABLE) ×6
HANDPIECE INTERPULSE COAX TIP (DISPOSABLE) ×3
IMMOBILIZER KNEE 22 UNIV (SOFTGOODS) ×3 IMPLANT
KIT BASIN OR (CUSTOM PROCEDURE TRAY) ×3 IMPLANT
KIT ROOM TURNOVER OR (KITS) ×3 IMPLANT
MANIFOLD NEPTUNE II (INSTRUMENTS) ×3 IMPLANT
NDL SAFETY ECLIPSE 18X1.5 (NEEDLE) IMPLANT
NEEDLE HYPO 18GX1.5 SHARP (NEEDLE)
NS IRRIG 1000ML POUR BTL (IV SOLUTION) ×3 IMPLANT
PACK TOTAL JOINT (CUSTOM PROCEDURE TRAY) ×3 IMPLANT
PAD ABD 8X10 STRL (GAUZE/BANDAGES/DRESSINGS) ×2 IMPLANT
PAD ARMBOARD 7.5X6 YLW CONV (MISCELLANEOUS) ×3 IMPLANT
PADDING CAST COTTON 6X4 STRL (CAST SUPPLIES) ×3 IMPLANT
SET HNDPC FAN SPRY TIP SCT (DISPOSABLE) ×1 IMPLANT
SET PAD KNEE POSITIONER (MISCELLANEOUS) ×3 IMPLANT
STAPLER VISISTAT 35W (STAPLE) IMPLANT
STRIP CLOSURE SKIN 1/2X4 (GAUZE/BANDAGES/DRESSINGS) ×1 IMPLANT
SUCTION FRAZIER HANDLE 10FR (MISCELLANEOUS) ×2
SUCTION TUBE FRAZIER 10FR DISP (MISCELLANEOUS) ×1 IMPLANT
SUT MNCRL AB 4-0 PS2 18 (SUTURE) IMPLANT
SUT VIC AB 0 CT1 27 (SUTURE) ×3
SUT VIC AB 0 CT1 27XBRD ANBCTR (SUTURE) ×1 IMPLANT
SUT VIC AB 1 CT1 27 (SUTURE) ×6
SUT VIC AB 1 CT1 27XBRD ANBCTR (SUTURE) ×2 IMPLANT
SUT VIC AB 2-0 CT1 27 (SUTURE) ×6
SUT VIC AB 2-0 CT1 TAPERPNT 27 (SUTURE) ×2 IMPLANT
SYR 50ML LL SCALE MARK (SYRINGE) IMPLANT
TOWEL OR 17X24 6PK STRL BLUE (TOWEL DISPOSABLE) ×3 IMPLANT
TOWEL OR 17X26 10 PK STRL BLUE (TOWEL DISPOSABLE) ×3 IMPLANT
TRAY CATH 16FR W/PLASTIC CATH (SET/KITS/TRAYS/PACK) IMPLANT
WRAP KNEE MAXI GEL POST OP (GAUZE/BANDAGES/DRESSINGS) ×3 IMPLANT

## 2017-03-06 NOTE — Anesthesia Preprocedure Evaluation (Signed)
Anesthesia Evaluation  Patient identified by MRN, date of birth, ID band Patient awake    Reviewed: Allergy & Precautions, NPO status , Patient's Chart, lab work & pertinent test results  History of Anesthesia Complications Negative for: history of anesthetic complications  Airway Mallampati: IV  TM Distance: >3 FB Neck ROM: Full    Dental  (+) Teeth Intact   Pulmonary sleep apnea , former smoker,    breath sounds clear to auscultation       Cardiovascular hypertension, Pt. on medications  Rhythm:Regular     Neuro/Psych  Neuromuscular disease negative psych ROS   GI/Hepatic hiatal hernia, PUD, GERD  Medicated and Controlled,  Endo/Other  negative endocrine ROS  Renal/GU negative Renal ROS     Musculoskeletal  (+) Arthritis ,   Abdominal   Peds  Hematology negative hematology ROS (+)   Anesthesia Other Findings   Reproductive/Obstetrics                             Anesthesia Physical Anesthesia Plan  ASA: II  Anesthesia Plan: MAC, Regional and Spinal   Post-op Pain Management:    Induction:   PONV Risk Score and Plan: 2 and Ondansetron  Airway Management Planned: Nasal Cannula  Additional Equipment: None  Intra-op Plan:   Post-operative Plan: Extubation in OR  Informed Consent: I have reviewed the patients History and Physical, chart, labs and discussed the procedure including the risks, benefits and alternatives for the proposed anesthesia with the patient or authorized representative who has indicated his/her understanding and acceptance.   Dental advisory given  Plan Discussed with: CRNA and Surgeon  Anesthesia Plan Comments:         Anesthesia Quick Evaluation

## 2017-03-06 NOTE — Progress Notes (Signed)
Orthopedic Tech Progress Note Patient Details:  Julie Dougherty October 28, 1950 [id number]  CPM Right Knee CPM Right Knee: On Right Knee Flexion (Degrees): 90 Right Knee Extension (Degrees): 0 Additional Comments: pt tolerated application of cpm on right knee at 0-90 degrees.  Right knee.    Post Interventions Patient Tolerated: Well Instructions Provided: Care of device  [name] 03/06/2017, 4:54 PM

## 2017-03-06 NOTE — H&P (Signed)
TOTAL KNEE ADMISSION H&P  Patient is being admitted for right total knee arthroplasty.  Subjective:  Chief Complaint:right knee pain.  HPI: Julie Dougherty, 67 y.o. female, has a history of pain and functional disability in the right knee due to arthritis and has failed non-surgical conservative treatments for greater than 12 weeks to includeNSAID's and/or analgesics, corticosteriod injections, viscosupplementation injections, flexibility and strengthening excercises, weight reduction as appropriate and activity modification.  Onset of symptoms was gradual, starting 2 years ago with gradually worsening course since that time. The patient noted no past surgery on the right knee(s).  Patient currently rates pain in the right knee(s) at 10 out of 10 with activity. Patient has night pain, worsening of pain with activity and weight bearing, pain that interferes with activities of daily living, pain with passive range of motion, crepitus and joint swelling.  Patient has evidence of subchondral sclerosis, periarticular osteophytes and joint space narrowing by imaging studies. There is no active infection.  Patient Active Problem List   Diagnosis Date Noted  . Unilateral primary osteoarthritis, right knee 03/06/2017  . Status post total knee replacement, left 08/29/2016  . Unilateral primary osteoarthritis, left knee 07/17/2016  . Acute pain of right knee 06/28/2016  . Baker's cyst of knee, left - suspected 05/30/2016  . Diverticulitis of colon 03/17/2016  . Hemorrhoids, internal, with bleeding Gr 2 prolapsed 06/30/2011  . Diverticulitis 06/06/2011  . Rectal bleeding 04/19/2009  . ABDOMINAL PAIN, RIGHT LOWER QUADRANT 04/19/2009  . HYPERLIPIDEMIA 07/11/2006  . HYPERTENSION, ESSENTIAL NOS 07/11/2006  . GERD 07/11/2006  . HEPATITIS B, HX OF 07/11/2006   Past Medical History:  Diagnosis Date  . Arthritis   . Cataract   . Chronic constipation   . Diverticulitis   . Diverticulosis   .  Fatty liver   . GERD (gastroesophageal reflux disease)   . Hemorrhoids, internal, with bleeding Gr 2 prolapsed 06/30/2011   Found on colonoscopy 2011 and anoscopy May 2013   . Hepatitis    "B"    no rx >10 yrs  . Hiatal hernia   . High cholesterol   . Hypertension   . Internal hemorrhoids   . OSA (obstructive sleep apnea)   . Pre-diabetes   . PUD (peptic ulcer disease)   . Rectal ulceration 04/27/2009   Associated reactive/regenerative changes and fibromuscular extensions into lamina propria/Mucosal Prolapse  . Sleep apnea    Waiting on CPAP machine    Past Surgical History:  Procedure Laterality Date  . ABDOMINAL HYSTERECTOMY    . COLONOSCOPY  04/27/09    POLYPOID FRAGMENT OF COLONIC MUCOSA WITH SURFACE  . HEMORRHOID BANDING    . TOTAL KNEE ARTHROPLASTY Left 08/29/2016   Procedure: LEFT TOTAL KNEE ARTHROPLASTY;  Surgeon: Mcarthur Rossetti, MD;  Location: [facility];  Service: Orthopedics;  Laterality: Left;  . TUBAL LIGATION      Current Facility-Administered Medications  Medication Dose Route Frequency Provider Last Rate Last Dose  . ceFAZolin (ANCEF) IVPB 2g/100 mL premix  2 g Intravenous On Call to OR Pete Pelt, PA-C      . chlorhexidine (HIBICLENS) 4 % liquid 4 application  60 mL Topical Once Erskine Emery W, PA-C      . tranexamic acid (CYKLOKAPRON) 1,000 mg in sodium chloride 0.9 % 100 mL IVPB  1,000 mg Intravenous To OR Mcarthur Rossetti, MD       Allergies  Allergen Reactions  . Ciprofloxacin Hives and Nausea Only  . Flagyl [Metronidazole]  Hives and Nausea Only    Social History   Tobacco Use  . Smoking status: Former Smoker    Last attempt to quit: 08/22/1996    Years since quitting: 20.5  . Smokeless tobacco: Never Used  . Tobacco comment: "when she was young"  Substance Use Topics  . Alcohol use: No    Comment: seldom    Family History  Problem Relation Age of Onset  . Diabetes Sister   . Colon cancer Neg Hx      Review of Systems   Musculoskeletal: Positive for joint pain.  All other systems reviewed and are negative.   Objective:  Physical Exam  Constitutional: She is oriented to person, place, and time. She appears well-developed and well-nourished.  HENT:  Head: Normocephalic and atraumatic.  Eyes: EOM are normal. Pupils are equal, round, and reactive to light.  Neck: Normal range of motion. Neck supple.  Cardiovascular: Normal rate and regular rhythm.  Respiratory: Effort normal and breath sounds normal.  GI: Soft. Bowel sounds are normal.  Musculoskeletal:       Right knee: She exhibits decreased range of motion, swelling, effusion and abnormal alignment. Tenderness found. Medial joint line and lateral joint line tenderness noted.  Neurological: She is alert and oriented to person, place, and time.  Skin: Skin is warm and dry.  Psychiatric: She has a normal mood and affect.    Vital signs in last 24 hours: Temp:  [98 F (36.7 C)] 98 F (36.7 C) (02/05 1154) Pulse Rate:  [71] 71 (02/05 1154) Resp:  [20] 20 (02/05 1154) BP: (125)/(70) 125/70 (02/05 1155) SpO2:  [100 %] 100 % (02/05 1154)  Labs:   Estimated body mass index is 28.23 kg/m as calculated from the following:   Height as of 02/28/17: 5\' 6"  (1.676 m).   Weight as of 02/28/17: 174 lb 14.4 oz (79.3 kg).   Imaging Review Plain radiographs demonstrate severe degenerative joint disease of the right knee(s). The overall alignment ismild varus. The bone quality appears to be good for age and reported activity level.  Assessment/Plan:  End stage arthritis, right knee   The patient history, physical examination, clinical judgment of the provider and imaging studies are consistent with end stage degenerative joint disease of the right knee(s) and total knee arthroplasty is deemed medically necessary. The treatment options including medical management, injection therapy arthroscopy and arthroplasty were discussed at length. The risks and benefits  of total knee arthroplasty were presented and reviewed. The risks due to aseptic loosening, infection, stiffness, patella tracking problems, thromboembolic complications and other imponderables were discussed. The patient acknowledged the explanation, agreed to proceed with the plan and consent was signed. Patient is being admitted for inpatient treatment for surgery, pain control, PT, OT, prophylactic antibiotics, VTE prophylaxis, progressive ambulation and ADL's and discharge planning. The patient is planning to be discharged home with home health services

## 2017-03-06 NOTE — Anesthesia Procedure Notes (Signed)
Anesthesia Regional Block: Adductor canal block   Pre-Anesthetic Checklist: ,, timeout performed, Correct Patient, Correct Site, Correct Laterality, Correct Procedure, Correct Position, site marked, Risks and benefits discussed,  Surgical consent,  Pre-op evaluation,  At surgeon's request and post-op pain management  Laterality: Lower and Right  Prep: chloraprep       Needles:  Injection technique: Single-shot  Needle Type: Echogenic Stimulator Needle          Additional Needles:   Narrative:  Start time: 03/06/2017 1:49 PM End time: 03/06/2017 1:55 PM Injection made incrementally with aspirations every 5 mL.  Performed by: Personally  Anesthesiologist: Oleta Mouse, MD  Additional Notes: H+P and labs reviewed, risks and benefits discussed with patient, procedure tolerated well without complications

## 2017-03-06 NOTE — Transfer of Care (Signed)
Immediate Anesthesia Transfer of Care Note  Patient: timea breed  Procedure(s) Performed: RIGHT TOTAL KNEE ARTHROPLASTY (Right Knee)  Patient Location: PACU  Anesthesia Type:Regional and Spinal  Level of Consciousness: awake, alert  and oriented  Airway & Oxygen Therapy: Patient Spontanous Breathing and Patient connected to nasal cannula oxygen  Post-op Assessment: Report given to RN and Post -op Vital signs reviewed and stable  Post vital signs: Reviewed and stable  Last Vitals:  Vitals:   03/06/17 1154 03/06/17 1155  BP:  125/70  Pulse: 71   Resp: 20   Temp: 36.7 C   SpO2: 100%     Last Pain:  Vitals:   03/06/17 1159  TempSrc:   PainSc: 0-No pain      Patients Stated Pain Goal: 6 (30/86/57 8469)  Complications: No apparent anesthesia complications

## 2017-03-06 NOTE — Brief Op Note (Signed)
03/06/2017  3:46 PM  PATIENT:  Julie Dougherty  67 y.o. female  PRE-OPERATIVE DIAGNOSIS:  Osteoarthritis Right Knee  POST-OPERATIVE DIAGNOSIS:  Osteoarthritis Right Knee  PROCEDURE:  Procedure(s): RIGHT TOTAL KNEE ARTHROPLASTY (Right)  SURGEON:  Surgeon(s) and Role:    Mcarthur Rossetti, MD - Primary  PHYSICIAN ASSISTANT: Benita Stabile, PA-C  ANESTHESIA:   local, regional and spinal  COUNTS:  YES  TOURNIQUET:   Total Tourniquet Time Documented: Thigh (Right) - 55 minutes Total: Thigh (Right) - 55 minutes   DICTATION: .Other Dictation: Dictation Number [id number]  PLAN OF CARE: Admit to inpatient   PATIENT DISPOSITION:  PACU - hemodynamically stable.   Delay start of Pharmacological VTE agent (>24hrs) due to surgical blood loss or risk of bleeding: no

## 2017-03-07 ENCOUNTER — Encounter (HOSPITAL_COMMUNITY): Payer: Self-pay | Admitting: General Practice

## 2017-03-07 ENCOUNTER — Other Ambulatory Visit: Payer: Self-pay

## 2017-03-07 LAB — CBC
HCT: 36.6 % (ref 36.0–46.0)
Hemoglobin: 11.8 g/dL — ABNORMAL LOW (ref 12.0–15.0)
MCH: 28.2 pg (ref 26.0–34.0)
MCHC: 32.2 g/dL (ref 30.0–36.0)
MCV: 87.6 fL (ref 78.0–100.0)
PLATELETS: 221 10*3/uL (ref 150–400)
RBC: 4.18 MIL/uL (ref 3.87–5.11)
RDW: 13.1 % (ref 11.5–15.5)
WBC: 12.2 10*3/uL — ABNORMAL HIGH (ref 4.0–10.5)

## 2017-03-07 LAB — BASIC METABOLIC PANEL
Anion gap: 14 (ref 5–15)
BUN: 9 mg/dL (ref 6–20)
CO2: 21 mmol/L — ABNORMAL LOW (ref 22–32)
CREATININE: 0.86 mg/dL (ref 0.44–1.00)
Calcium: 8.6 mg/dL — ABNORMAL LOW (ref 8.9–10.3)
Chloride: 99 mmol/L — ABNORMAL LOW (ref 101–111)
GFR calc Af Amer: >60 mL/min (ref >60)
GFR calc non Af Amer: >60 mL/min (ref >60)
GLUCOSE: 133 mg/dL — AB (ref 65–99)
POTASSIUM: 3.9 mmol/L (ref 3.5–5.1)
Sodium: 134 mmol/L — ABNORMAL LOW (ref 135–145)

## 2017-03-07 NOTE — Plan of Care (Signed)
  Progressing Pain Managment: General experience of comfort will improve 03/07/2017 0234 - Progressing by Janee Morn, RN

## 2017-03-07 NOTE — Progress Notes (Signed)
Physical Therapy Treatment Patient Details Name: Julie Dougherty MRN: [id number] DOB: 1950/05/09 Today's Date: 03/07/2017    History of Present Illness Admitted for RTKA, [name];  has a past medical history of Arthritis, Cataract, Chronic constipation, Diverticulitis, Diverticulosis, Fatty liver, GERD (gastroesophageal reflux disease), Hemorrhoids, internal, with bleeding Gr 2 prolapsed (06/30/2011), Hepatitis, Hiatal hernia, High cholesterol, Hypertension, Internal hemorrhoids, OSA (obstructive sleep apnea), Pre-diabetes, PUD (peptic ulcer disease), Rectal ulceration (04/27/2009), and Sleep apnea.    PT Comments    Continuing work on functional mobility and activity tolerance;  Better pain control, and able to participate in gait and therex (though limited therex) after OT and tub transfer;  Used [name] [id number]) with stratus for video interpretation  Follow Up Recommendations  DC plan and follow up therapy as arranged by surgeon     Equipment Recommendations  None recommended by PT(well-equipped from previous TKA)    Recommendations for Other Services       Precautions / Restrictions Precautions Precautions: Knee Precaution Booklet Issued: Yes (comment)(Gave TKA HEP in Spanish; does not read, so focus on pictures with therex education) Restrictions RLE Weight Bearing: Weight bearing as tolerated  Pt educated to not allow any pillow or bolster under knee for healing with optimal range of motion.    Mobility  Bed Mobility Overal bed mobility: Needs Assistance Bed Mobility: Supine to Sit     Supine to sit: Min assist     General bed mobility comments: Min assist to support RLE coming off of bed  Transfers Overall transfer level: Needs assistance Equipment used: Rolling walker (2 wheeled) Transfers: Sit to/from Stand Sit to Stand: Min assist         General transfer comment: Cues for hand placement and safety; min assist to  steady  Ambulation/Gait Ambulation/Gait assistance: Min guard Ambulation Distance (Feet): 100 Feet Assistive device: Rolling walker (2 wheeled) Gait Pattern/deviations: Step-through pattern(emerging)   Gait velocity interpretation: Below normal speed for age/gender General Gait Details: Cues for gait sequence and to activate R quad for stance stability   Stairs            Wheelchair Mobility    Modified Rankin (Stroke Patients Only)       Balance     Sitting balance-Leahy Scale: Good       Standing balance-Leahy Scale: Poor Standing balance comment: Dependent on UE support                            Cognition Arousal/Alertness: Awake/alert Behavior During Therapy: WFL for tasks assessed/performed Overall Cognitive Status: Within Functional Limits for tasks assessed                                        Exercises Total Joint Exercises Quad Sets: AROM;Right;10 reps Short Arc QuadSinclair Ship;Right;10 reps Heel Slides: AAROM;Right;10 reps    General Comments        Pertinent Vitals/Pain Pain Assessment: 0-10 Pain Score: 10-Worst pain ever Pain Location: R knee Pain Descriptors / Indicators: Aching;Constant Pain Intervention(s): Monitored during session;Premedicated before session    Home Living                      Prior Function            PT Goals (current goals can now be found in the care plan section) Acute  Rehab PT Goals Patient Stated Goal: walk without pain PT Goal Formulation: With patient Time For Goal Achievement: 03/14/17 Potential to Achieve Goals: Good Progress towards PT goals: Progressing toward goals    Frequency    7X/week      PT Plan Current plan remains appropriate    Co-evaluation              AM-PAC PT "6 Clicks" Daily Activity  Outcome Measure  Difficulty turning over in bed (including adjusting bedclothes, sheets and blankets)?: A Little Difficulty moving from lying  on back to sitting on the side of the bed? : A Little Difficulty sitting down on and standing up from a chair with arms (e.g., wheelchair, bedside commode, etc,.)?: A Little Help needed moving to and from a bed to chair (including a wheelchair)?: A Little Help needed walking in hospital room?: A Little Help needed climbing 3-5 steps with a railing? : A Lot 6 Click Score: 17    End of Session Equipment Utilized During Treatment: Gait belt Activity Tolerance: Patient tolerated treatment well Patient left: in chair;with call bell/phone within reach Nurse Communication: Mobility status PT Visit Diagnosis: Other abnormalities of gait and mobility (R26.89);Pain Pain - Right/Left: Right Pain - part of body: Knee     Time: 4920-1007 PT Time Calculation (min) (ACUTE ONLY): 22 min  Charges:  $Gait Training: 8-22 mins                    G Codes:       [name], [place]  Acute Rehabilitation Services Pager [phone] Office [phone]'   [name] 03/07/2017, 4:44 PM

## 2017-03-07 NOTE — Evaluation (Signed)
Occupational Therapy Evaluation Patient Details Name: Julie Dougherty MRN: [id number] DOB: 12/06/1950 Today's Date: 03/07/2017    History of Present Illness Admitted for RTKA, [name];  has a past medical history of Arthritis, Cataract, Chronic constipation, Diverticulitis, Diverticulosis, Fatty liver, GERD (gastroesophageal reflux disease), Hemorrhoids, internal, with bleeding Gr 2 prolapsed (06/30/2011), Hepatitis, Hiatal hernia, High cholesterol, Hypertension, Internal hemorrhoids, OSA (obstructive sleep apnea), Pre-diabetes, PUD (peptic ulcer disease), Rectal ulceration (04/27/2009), and Sleep apnea.   Clinical Impression   Pt reports she was independent with ADL PTA. Currently pt overall min-min guard assist for functional mobility in ADL. Pt planning to d/c home with supervision from family. Pt would benefit from continued skilled OT to address established goals.    Follow Up Recommendations  No OT follow up;Supervision/Assistance - 24 hour    Equipment Recommendations  None recommended by OT    Recommendations for Other Services       Precautions / Restrictions Precautions Precautions: Knee Precaution Booklet Issued: No Restrictions Weight Bearing Restrictions: Yes RLE Weight Bearing: Weight bearing as tolerated      Mobility Bed Mobility Overal bed mobility: Needs Assistance Bed Mobility: Supine to Sit     Supine to sit: Min assist     General bed mobility comments: Min assist to support RLE coming off of bed  Transfers Overall transfer level: Needs assistance Equipment used: Rolling walker (2 wheeled) Transfers: Sit to/from Stand Sit to Stand: Min assist         General transfer comment: Cues for hand placement and safety; min assist to steady    Balance Overall balance assessment: Needs assistance   Sitting balance-Leahy Scale: Good       Standing balance-Leahy Scale: Poor Standing balance comment: Dependent on UE support                           ADL either performed or assessed with clinical judgement   ADL Overall ADL's : Needs assistance/impaired Eating/Feeding: Set up;Sitting   Grooming: Set up;Supervision/safety;Sitting   Upper Body Bathing: Set up;Supervision/ safety;Sitting   Lower Body Bathing: Minimal assistance;Sit to/from stand   Upper Body Dressing : Set up;Supervision/safety;Sitting   Lower Body Dressing: Minimal assistance;Sit to/from stand Lower Body Dressing Details (indicate cue type and reason): Pt reports daughter in law can help if needed Toilet Transfer: Min guard;Ambulation;BSC;RW Toilet Transfer Details (indicate cue type and reason): Simulated     Tub/ Shower Transfer: Minimal assistance;Tub transfer;Ambulation;3 in 1;Rolling walker Tub/Shower Transfer Details (indicate cue type and reason): Assist for RLE over edge of tub--daughter in law to assist as needed Functional mobility during ADLs: Min guard;Rolling walker       Vision         Perception     Praxis      Pertinent Vitals/Pain Pain Assessment: 0-10 Pain Score: 10-Worst pain ever Pain Location: R knee Pain Descriptors / Indicators: Aching;Constant Pain Intervention(s): Monitored during session;Premedicated before session;Repositioned     Hand Dominance Right   Extremity/Trunk Assessment Upper Extremity Assessment Upper Extremity Assessment: Overall WFL for tasks assessed   Lower Extremity Assessment Lower Extremity Assessment: Defer to PT evaluation   Cervical / Trunk Assessment Cervical / Trunk Assessment: Normal   Communication Communication Communication: Prefers language other than English(Spanish interpreter, [name] [phone])   Cognition Arousal/Alertness: Awake/alert Behavior During Therapy: WFL for tasks assessed/performed Overall Cognitive Status: Within Functional Limits for tasks assessed  General Comments       Exercises    Shoulder  Instructions      Home Living Family/patient expects to be discharged to:: Private residence Living Arrangements: Alone Available Help at Discharge: Family;Available 24 hours/day Type of Home: House Home Access: Stairs to enter [employer] of Steps: 1   Home Layout: One level     Bathroom Shower/Tub: Tub/shower unit;Curtain   Biochemist, clinical: Standard     Home Equipment: Cane - single point;Walker - 2 wheels;Bedside commode   Additional Comments: Planning to stay at sons house initially      Prior Functioning/Environment Level of Independence: Independent                 OT Problem List: Decreased strength;Decreased activity tolerance;Impaired balance (sitting and/or standing);Decreased knowledge of use of DME or AE;Decreased knowledge of precautions;Pain;Increased edema      OT Treatment/Interventions: Self-care/ADL training;Energy conservation;DME and/or AE instruction;Therapeutic activities;Patient/family education;Balance training    OT Goals(Current goals can be found in the care plan section) Acute Rehab OT Goals Patient Stated Goal: walk without pain OT Goal Formulation: With patient Time For Goal Achievement: 03/21/17 Potential to Achieve Goals: Good ADL Goals Pt Will Perform Lower Body Bathing: with supervision;sit to/from stand Pt Will Perform Lower Body Dressing: with supervision;sit to/from stand Pt Will Transfer to Toilet: with supervision;bedside commode;ambulating Pt Will Perform Toileting - Clothing Manipulation and hygiene: with supervision;sit to/from stand  OT Frequency: Min 2X/week   Barriers to D/C:            Co-evaluation              AM-PAC PT "6 Clicks" Daily Activity     Outcome Measure Help from another person eating meals?: None Help from another person taking care of personal grooming?: A Little Help from another person toileting, which includes using toliet, bedpan, or urinal?: A Little Help from another  person bathing (including washing, rinsing, drying)?: A Little Help from another person to put on and taking off regular upper body clothing?: None Help from another person to put on and taking off regular lower body clothing?: A Little 6 Click Score: 20   End of Session Equipment Utilized During Treatment: Gait belt;Rolling walker CPM Right Knee CPM Right Knee: Off  Activity Tolerance: Patient tolerated treatment well;Patient limited by pain Patient left: in chair;Other (comment)(with PT)  OT Visit Diagnosis: Other abnormalities of gait and mobility (R26.89);Unsteadiness on feet (R26.81);Pain Pain - Right/Left: Right Pain - part of body: Knee                Time: 1450-1513 (dovetail with PT) OT Time Calculation (min): 23 min Charges:  OT General Charges $OT Visit: 1 Visit OT Evaluation $OT Eval Moderate Complexity: 1 Mod OT Treatments $Self Care/Home Management : 8-22 mins G-Codes:     Aneeka Bowden A. Ulice Brilliant, M.S., OTR/L Pager: [place] 03/07/2017, 5:05 PM

## 2017-03-07 NOTE — Evaluation (Signed)
Physical Therapy Evaluation Patient Details Name: Julie Dougherty MRN: [id number] DOB: 08-23-1950 Today's Date: 03/07/2017   History of Present Illness  Admitted for RTKA, [name];  has a past medical history of Arthritis, Cataract, Chronic constipation, Diverticulitis, Diverticulosis, Fatty liver, GERD (gastroesophageal reflux disease), Hemorrhoids, internal, with bleeding Gr 2 prolapsed (06/30/2011), Hepatitis, Hiatal hernia, High cholesterol, Hypertension, Internal hemorrhoids, OSA (obstructive sleep apnea), Pre-diabetes, PUD (peptic ulcer disease), Rectal ulceration (04/27/2009), and Sleep apnea.  Clinical Impression   Pt is s/p TKA resulting in the deficits listed below (see PT Problem List). In a lot of pain this session, but still very motivated and moving well;  Pt will benefit from skilled PT to increase their independence and safety with mobility to allow discharge to the venue listed below.      Follow Up Recommendations DC plan and follow up therapy as arranged by surgeon    Equipment Recommendations  None recommended by PT(well-equipped from previous TKA)    Recommendations for Other Services OT consult     Precautions / Restrictions Precautions Precautions: Knee Precaution Booklet Issued: No(Not yet; need Spanish) Restrictions Weight Bearing Restrictions: Yes RLE Weight Bearing: Weight bearing as tolerated      Mobility  Bed Mobility Overal bed mobility: Needs Assistance Bed Mobility: Supine to Sit     Supine to sit: Min guard     General bed mobility comments: Min assist to support RLE coming off of bed  Transfers Overall transfer level: Needs assistance Equipment used: Rolling walker (2 wheeled) Transfers: Sit to/from Stand Sit to Stand: Min assist         General transfer comment: Cues for hand placement and safety; min assist to steady  Ambulation/Gait Ambulation/Gait assistance: Min assist;Min guard Ambulation Distance (Feet): 90  Feet Assistive device: Rolling walker (2 wheeled) Gait Pattern/deviations: Step-through pattern(emerging)     General Gait Details: Cues for gait sequence and to activate R quad for stance stability  Stairs            Wheelchair Mobility    Modified Rankin (Stroke Patients Only)       Balance Overall balance assessment: Needs assistance   Sitting balance-Leahy Scale: Good       Standing balance-Leahy Scale: Poor Standing balance comment: Dependent on UE support                             Pertinent Vitals/Pain Pain Assessment: 0-10 Pain Score: 10-Worst pain ever Pain Location: R knee Pain Descriptors / Indicators: Aching;Constant Pain Intervention(s): Monitored during session;Patient requesting pain meds-RN notified;Ice applied    Home Living Family/patient expects to be discharged to:: Private residence Living Arrangements: Children Available Help at Discharge: Family;Available 24 hours/day Type of Home: House Home Access: Stairs to enter   [employer] of Steps: 1 Home Layout: One level Home Equipment: Cane - single point;Walker - 2 wheels Additional Comments: Will need to verify dc plans    Prior Function Level of Independence: Independent         Comments: Good recovery post LTKA approx 6 months ago     Hand Dominance   Dominant Hand: Right    Extremity/Trunk Assessment   Upper Extremity Assessment Upper Extremity Assessment: Defer to OT evaluation    Lower Extremity Assessment Lower Extremity Assessment: RLE deficits/detail RLE Deficits / Details: Grossly decr AROM and strength, limited by pain postop; ROM approx 2-60 deg RLE: Unable to fully assess due to pain  Communication   Communication: Prefers language other than English(Spanish interpreter, Sol Blazing, [phone])  Cognition Arousal/Alertness: Awake/alert Behavior During Therapy: WFL for tasks assessed/performed Overall Cognitive Status: Within Functional  Limits for tasks assessed                                        General Comments      Exercises Total Joint Exercises Ankle Circles/Pumps: AROM;Both;10 reps Quad Sets: AROM;Right;10 reps Heel Slides: AAROM;Right;5 reps   Assessment/Plan    PT Assessment Patient needs continued PT services  PT Problem List Decreased strength;Decreased range of motion;Decreased activity tolerance;Decreased balance;Decreased mobility;Decreased knowledge of use of DME;Decreased knowledge of precautions;Pain       PT Treatment Interventions DME instruction;Gait training;Stair training;Functional mobility training;Therapeutic activities;Therapeutic exercise;Balance training;Patient/family education    PT Goals (Current goals can be found in the Care Plan section)  Acute Rehab PT Goals Patient Stated Goal: walk without pain PT Goal Formulation: With patient Time For Goal Achievement: 03/14/17 Potential to Achieve Goals: Good    Frequency 7X/week   Barriers to discharge        Co-evaluation               AM-PAC PT "6 Clicks" Daily Activity  Outcome Measure Difficulty turning over in bed (including adjusting bedclothes, sheets and blankets)?: A Little Difficulty moving from lying on back to sitting on the side of the bed? : A Lot Difficulty sitting down on and standing up from a chair with arms (e.g., wheelchair, bedside commode, etc,.)?: A Little Help needed moving to and from a bed to chair (including a wheelchair)?: A Little Help needed walking in hospital room?: A Little Help needed climbing 3-5 steps with a railing? : A Lot 6 Click Score: 16    End of Session Equipment Utilized During Treatment: Gait belt Activity Tolerance: Patient tolerated treatment well Patient left: in chair;with call bell/phone within reach;with family/visitor present Nurse Communication: Mobility status PT Visit Diagnosis: Other abnormalities of gait and mobility (R26.89);Pain Pain -  Right/Left: Right Pain - part of body: Knee    Time: 0847-0920 PT Time Calculation (min) (ACUTE ONLY): 33 min   Charges:   PT Evaluation $PT Eval Low Complexity: 1 Low PT Treatments $Gait Training: 8-22 mins   PT G Codes:        [name], PT  Acute Rehabilitation Services Pager [phone] Office [phone]   [name] 03/07/2017, 10:33 AM

## 2017-03-07 NOTE — Op Note (Deleted)
  The note originally documented on this encounter has been moved the the encounter in which it belongs.  

## 2017-03-07 NOTE — Anesthesia Postprocedure Evaluation (Signed)
Anesthesia Post Note  Patient: Julie Dougherty  Procedure(s) Performed: RIGHT TOTAL KNEE ARTHROPLASTY (Right Knee)     Patient location during evaluation: PACU Anesthesia Type: Regional and Spinal Level of consciousness: awake and alert, patient cooperative and oriented Pain management: pain level controlled Vital Signs Assessment: post-procedure vital signs reviewed and stable Respiratory status: spontaneous breathing, nonlabored ventilation and respiratory function stable Cardiovascular status: stable and blood pressure returned to baseline Postop Assessment: spinal receding and no apparent nausea or vomiting Anesthetic complications: no    Last Vitals:  Vitals:   03/07/17 0053 03/07/17 0454  BP: 138/77 137/75  Pulse: 100 (!) 108  Resp: 16 18  Temp: 37.2 C 36.8 C  SpO2: 100% 100%    Last Pain:  Vitals:   03/07/17 0522  TempSrc:   PainSc: 8                  Tymesha Ditmore,E. Falyn Rubel

## 2017-03-07 NOTE — Op Note (Signed)
NAME:  Julie Dougherty, Julie Dougherty   ACCOUNT NO.:  [id number]  MEDICAL RECORD NO.:  [id number]  LOCATION:                                 FACILITY:  PHYSICIAN:  Lind Guest. Ninfa Linden, M.D.DATE OF BIRTH:  09-30-1950  DATE OF PROCEDURE:  03/06/2017 DATE OF DISCHARGE:                              OPERATIVE REPORT   PREOPERATIVE DIAGNOSIS:  Primary osteoarthritis and degenerative joint disease, right knee.  POSTOPERATIVE DIAGNOSIS:  Primary osteoarthritis and degenerative joint disease, right knee.  PROCEDURE:  Right total knee arthroplasty.  IMPLANTS:  [employer]ryker Triathlon press-fit knee system with size 3 femur, size 3 tibial tray, 11 mm fix-bearing polyethylene insert, size 29 press- fit patellar button.  SURGEON:  Lind Guest. Ninfa Linden, MD.  ASSISTANT:  Erskine Emery, PA-C.  ANESTHESIA: 1. Right lower extremity adductor canal block. 2. Spinal. 3. Intracapsular local injection with a mixture of Exparel, plain     Marcaine and saline.  ANTIBIOTICS:  2 g of IV Ancef.  TOURNIQUET TIME:  Under 1 hour.  BLOOD LOSS:  Less than 100 mL.  COMPLICATIONS:  None.  INDICATIONS:  [name] is a 67 year old patient, well known to me.  She has bilateral knees severe osteoarthritis and degenerative joint disease.  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  She now wishes to have her right knee replaced given the success of her left knee replacement.  Her right knee shows severe end-stage arthritis as well. She understands the risk of acute blood loss anemia, nerve and vessel injury, fracture, infection, dislocation, and DVT.  She understands our goals are to decrease pain, improve mobility, and overall improved quality of life.  DESCRIPTION OF PROCEDURE:  After informed consent was obtained, appropriate right knee was marked.  An adductor canal block was obtained in the holding room.  She was then brought to  the operating room, sat up on the operating table and spinal anesthesia was obtained.  She was then laid in a supine position.  A Foley catheter was placed and a nonsterile tourniquet was placed on her upper right thigh.  Her right leg was sterilely prepped and draped from the thigh down the toes with DuraPrep and sterile drapes and a sterile stockinette.  Time-out was called.  She was identified as correct patient and correct right knee.  We then used an Esmarch to wrap out the leg and tourniquet was inflated to 300 mmHg of pressure.  We then made a direct midline incision over the patella and carried this proximally and distally.  We dissected down the knee joint and carried out a medial parapatellar arthrotomy finding a large joint effusion and significant synovitis throughout the knee and significant arthritis throughout the knee.  With the knee in a flexed position, we set our extramedullary cutting guide taking 9 mm off the high side of the proximal tibia correcting her varus and valgus and neutral slope.  We made this cut without difficulty.  We then went to the intramedullary area of the femur and placed intramedullary drill, so we could place our alignment rod for our distal femoral cut setting this for 5 degrees externally rotated her right knee and an 8 mm distal  femoral cut.  We made this cut without difficulty and brought the knee back down to full extension with a 9 mm extension block, we achieved full extension.  We then went back to the femur and put our femoral sizing guide based off the epicondylar axis and Whiteside's line.  We chose a size 3 femur.  We then put our 4-in-1 cutting block for a size 3 femur.  We made our anterior and posterior cuts, followed by our chamfer cuts.  We then made our femoral box cut.  We then went back to the tibia and chose a size 3 for tibial coverage and made our keel cut off this for a press-fit knee system with a size 3 tibia and a size 3  femur and we trialed our 9 mm and 11 mm polyethylene insert.  We were pleased with the 11.  We then cut our patella and drilled 3 holes for press-fit patellar button size 29.  We then removed all instrumentation from the knee and irrigated the knee with normal saline solution using pulsatile lavage.  We then placed a mixture of Exparel, Marcaine and saline around the knee joint capsule.  We then placed our real press-fit Stryker Triathlon knee tibial component size 3, followed by our real press-fit size 3 femur.  We placed our 11 mm fix-bearing polyethylene insert and press-fit our size 29 patellar button.  We then let the tourniquet down. Hemostasis was obtained with electrocautery.  We put the knee through range of motion.  I was pleased with the stability.  We then closed the arthrotomy with interrupted #1 Vicryl suture, followed by 0 Vicryl in the deep tissue, 2-0 Vicryl in the subcutaneous tissue, 4-0 Monocryl subcuticular stitch and Steri-Strips on the skin.  She was taken to the recovery room in stable condition.  All well-padded sterile dressings were applied.  She was taken to the recovery room in stable condition. All final counts were correct.  There were no complications noted.  Of note, Erskine Emery, PA-C, assisted in the entire case.  His assistance was crucial for facilitating all aspects of this case.     Lind Guest. Ninfa Linden, M.D.     CYB/MEDQ  D:  03/06/2017  T:  03/07/2017  Job:  [id number]

## 2017-03-07 NOTE — Progress Notes (Signed)
Subjective: 1 Day Post-Op Procedure(s) (LRB): RIGHT TOTAL KNEE ARTHROPLASTY (Right) Patient reports pain as moderate.    Objective: Vital signs in last 24 hours: Temp:  [97.7 F (36.5 C)-99 F (37.2 C)] 98.3 F (36.8 C) (02/06 0454) Pulse Rate:  [50-108] 108 (02/06 0454) Resp:  [14-20] 18 (02/06 0454) BP: (91-138)/(62-79) 137/75 (02/06 0454) SpO2:  [98 %-100 %] 100 % (02/06 0454)  Intake/Output from previous day: 02/05 0701 - 02/06 0700 In: 1500 [I.V.:1500] Out: 1750 [Urine:1650; Blood:100] Intake/Output this shift: No intake/output data recorded.  No results for input(s): HGB in the last 72 hours. No results for input(s): WBC, RBC, HCT, PLT in the last 72 hours. No results for input(s): NA, K, CL, CO2, BUN, CREATININE, GLUCOSE, CALCIUM in the last 72 hours. No results for input(s): LABPT, INR in the last 72 hours.  Sensation intact distally Intact pulses distally Dorsiflexion/Plantar flexion intact Incision: dressing C/D/I Compartment soft  Assessment/Plan: 1 Day Post-Op Procedure(s) (LRB): RIGHT TOTAL KNEE ARTHROPLASTY (Right) Up with therapy Plan for discharge tomorrow Discharge home with home health  Mcarthur Rossetti 03/07/2017, 7:49 AM

## 2017-03-08 ENCOUNTER — Encounter (HOSPITAL_COMMUNITY): Payer: Self-pay | Admitting: Orthopaedic Surgery

## 2017-03-08 MED ORDER — METHOCARBAMOL 500 MG PO TABS: 500.0000 mg | ORAL_TABLET | Freq: Four times a day (QID) | ORAL | 1 refills | Status: DC | PRN

## 2017-03-08 MED ORDER — HYDROCODONE-ACETAMINOPHEN 5-325 MG PO TABS: 1.0000 | ORAL_TABLET | ORAL | 0 refills | Status: DC | PRN

## 2017-03-08 MED ORDER — ASPIRIN 325 MG PO TBEC: 325.0000 mg | DELAYED_RELEASE_TABLET | Freq: Two times a day (BID) | ORAL | 0 refills | Status: DC

## 2017-03-08 NOTE — Addendum Note (Signed)
Addendum  created 03/08/17 1838 by Oleta Mouse, MD   Child order released for a procedure order, Intraprocedure Blocks edited, Sign clinical note

## 2017-03-08 NOTE — Progress Notes (Signed)
Rn gave PT discharge instructions. Pts son speaks english and translated for me pt stated concerns and what she did not understand. Rn answered questions. Pt comfortable and Ready to leave

## 2017-03-08 NOTE — Progress Notes (Addendum)
Subjective: 2 Days Post-Op Procedure(s) (LRB): RIGHT TOTAL KNEE ARTHROPLASTY (Right) Patient reports pain as moderate.  No shortness of breath , chest pain nausea or vomiting. Wanting to discharge to home. States this knee more painful than the last knee.   Objective: Vital signs in last 24 hours: Temp:  [98 F (36.7 C)-98.9 F (37.2 C)] 98 F (36.7 C) (02/07 0500) Pulse Rate:  [102-109] 106 (02/07 0500) Resp:  [17-18] 17 (02/07 0500) BP: (120-133)/(68-80) 120/80 (02/07 0500) SpO2:  [99 %-100 %] 100 % (02/07 0500)  Intake/Output from previous day: 02/06 0701 - 02/07 0700 In: 2531.3 [P.O.:960; I.V.:1571.3] Out: 1750 [Urine:1750] Intake/Output this shift: No intake/output data recorded.  Recent Labs    03/07/17 0642  HGB 11.8*   Recent Labs    03/07/17 0642  WBC 12.2*  RBC 4.18  HCT 36.6  PLT 221   Recent Labs    03/07/17 0642  NA 134*  K 3.9  CL 99*  CO2 21*  BUN 9  CREATININE 0.86  GLUCOSE 133*  CALCIUM 8.6*   No results for input(s): LABPT, INR in the last 72 hours.  Right lower extremity: Sensation intact distally Intact pulses distally Dorsiflexion/Plantar flexion intact Incision: dressing C/D/I Compartment soft  Assessment/Plan: 2 Days Post-Op Procedure(s) (LRB): RIGHT TOTAL KNEE ARTHROPLASTY (Right) Discharge home with home health  Julie Dougherty 03/08/2017, 11:30 AM

## 2017-03-08 NOTE — Anesthesia Procedure Notes (Signed)
Spinal  Patient location during procedure: OR Start time: 03/06/2017 2:06 PM End time: 03/06/2017 2:11 PM Staffing Anesthesiologist: Oleta Mouse, MD Preanesthetic Checklist Completed: patient identified, surgical consent, pre-op evaluation, timeout performed, IV checked, risks and benefits discussed and monitors and equipment checked Spinal Block Patient position: sitting Prep: site prepped and draped and DuraPrep Patient monitoring: heart rate, cardiac monitor, continuous pulse ox and blood pressure Approach: midline Location: L3-4 Injection technique: single-shot Needle Needle type: Pencan  Needle gauge: 24 G Needle length: 10 cm Assessment Sensory level: T4

## 2017-03-08 NOTE — Discharge Instructions (Signed)

## 2017-03-08 NOTE — Care Management Note (Signed)
Case Management Note  Patient Details  Name: Julie Dougherty MRN: [id number] Date of Birth: 20-Feb-1950  Subjective/Objective:    67 yr old female s/p right total knee arthroplasty.                Action/Plan: Case manager spoke with patient's son concerning discharge plan. Patient has used [facility] in the past. Referral was called to Josie Dixon, [facility] [facility]. Patient will have family support at discharge.   Expected Discharge Date:  03/08/17               Expected Discharge Plan:  [place]  In-House Referral:  NA  Discharge planning Services  CM Consult  Post Acute Care Choice:  Home Health Choice offered to:  Adult Children, Patient  DME Arranged:  (Has RW and 3in1) DME Agency:  NA  HH Arranged:  PT [place] Agency:  [facility] (formerly [employer])  Status of Service:  Completed, signed off  If discussed at [employer], dates discussed:    Additional Comments:  Ninfa Meeker, RN 03/08/2017, 12:20 PM

## 2017-03-08 NOTE — Discharge Summary (Signed)
Patient ID: Julie Dougherty MRN: [id number] DOB/AGE: 1950-08-15 67 y.o.  Admit date: 03/06/2017 Discharge date: 03/08/2017  Admission Diagnoses:  Principal Problem:   Unilateral primary osteoarthritis, right knee Active Problems:   Status post total right knee replacement   Discharge Diagnoses:  Same  Past Medical History:  Diagnosis Date  . Arthritis   . Cataract   . Chronic constipation   . Diverticulitis   . Diverticulosis   . Fatty liver   . GERD (gastroesophageal reflux disease)   . Hemorrhoids, internal, with bleeding Gr 2 prolapsed 06/30/2011   Found on colonoscopy 2011 and anoscopy May 2013   . Hepatitis    "B"    no rx >10 yrs  . Hiatal hernia   . High cholesterol   . Hypertension   . Internal hemorrhoids   . OSA (obstructive sleep apnea)   . Pre-diabetes   . PUD (peptic ulcer disease)   . Rectal ulceration 04/27/2009   Associated reactive/regenerative changes and fibromuscular extensions into lamina propria/Mucosal Prolapse  . Sleep apnea    Waiting on CPAP machine    Surgeries: Procedure(s): RIGHT TOTAL KNEE ARTHROPLASTY on 03/06/2017   Consultants:   Discharged Condition: Improved  Hospital Course: [place] is an 67 y.o. female who was admitted 03/06/2017 for operative treatment ofUnilateral primary osteoarthritis, right knee. Patient has severe unremitting pain that affects sleep, daily activities, and work/hobbies. After pre-op clearance the patient was taken to the operating room on 03/06/2017 and underwent  Procedure(s): RIGHT TOTAL KNEE ARTHROPLASTY.    Patient was given perioperative antibiotics:  Anti-infectives (From admission, onward)   Start     Dose/Rate Route Frequency Ordered Stop   03/06/17 2230  ceFAZolin (ANCEF) IVPB 1 g/50 mL premix     1 g 100 mL/hr over 30 Minutes Intravenous Every 8 hours 03/06/17 1748 03/07/17 0600   03/06/17 1330  ceFAZolin (ANCEF) IVPB 2g/100 mL premix     2 g 200 mL/hr over 30 Minutes  Intravenous On call to O.R. 03/06/17 1146 03/06/17 1410   03/06/17 1155  ceFAZolin (ANCEF) 2-4 GM/100ML-% IVPB    Comments:  Hogue, Samantha   : cabinet override      03/06/17 1155 03/06/17 1410       Patient was given sequential compression devices, early ambulation, and chemoprophylaxis to prevent DVT.  Patient benefited maximally from hospital stay and there were no complications.    Recent vital signs:  Patient Vitals for the past 24 hrs:  BP Temp Temp src Pulse Resp SpO2  03/08/17 0500 120/80 98 F (36.7 C) Oral (!) 106 17 100 %  03/07/17 2038 130/68 98.9 F (37.2 C) Oral (!) 109 17 99 %  03/07/17 1228 133/73 98.5 F (36.9 C) Oral (!) 102 18 100 %     Recent laboratory studies:  Recent Labs    03/07/17 0642  WBC 12.2*  HGB 11.8*  HCT 36.6  PLT 221  NA 134*  K 3.9  CL 99*  CO2 21*  BUN 9  CREATININE 0.86  GLUCOSE 133*  CALCIUM 8.6*     Discharge Medications:   Allergies as of 03/08/2017      Reactions   Ciprofloxacin Hives, Nausea Only   Flagyl [metronidazole] Hives, Nausea Only      Medication List    TAKE these medications   amLODipine-benazepril 5-20 MG capsule Commonly known as:  LOTREL Take 1 capsule by mouth daily.   aspirin 325 MG EC tablet Take  1 tablet (325 mg total) by mouth 2 (two) times daily after a meal. What changed:    medication strength  how much to take  when to take this   furosemide 20 MG tablet Commonly known as:  LASIX Take 20 mg by mouth daily.   HYDROcodone-acetaminophen 5-325 MG tablet Commonly known as:  NORCO/VICODIN Take 1-2 tablets by mouth every 4 (four) hours as needed for moderate pain ((score 4 to 6)).   methocarbamol 500 MG tablet Commonly known as:  ROBAXIN Take 1 tablet (500 mg total) by mouth every 6 (six) hours as needed for muscle spasms.   omega-3 fish oil 1000 MG Caps capsule Commonly known as:  MAXEPA Take 1 mg by mouth.   pantoprazole 40 MG tablet Commonly known as:  PROTONIX Take 40 mg by  mouth daily.   polyethylene glycol powder powder Commonly known as:  MIRALAX Take 17 g by mouth daily. What changed:    when to take this  reasons to take this   pravastatin 20 MG tablet Commonly known as:  PRAVACHOL Take 20 mg by mouth daily.   vitamin C 1000 MG tablet Take 1,000 mg by mouth daily.            Durable Medical Equipment  (From admission, onward)        Start     Ordered   03/06/17 1749  DME 3 n 1  Once     03/06/17 1748   03/06/17 1749  DME Walker rolling  Once    Question:  Patient needs a walker to treat with the following condition  Answer:  Status post total right knee replacement   03/06/17 1748      Diagnostic Studies: Dg Knee Right Port  Result Date: 03/06/2017 CLINICAL DATA:  67 y/o F; status post total right knee arthroplasty. EXAM: PORTABLE RIGHT KNEE - 1-2 VIEW COMPARISON:  02/02/2017 MRI of the right knee. FINDINGS: Total right knee arthroplasty with patellar resurfacing and prosthesis. No apparent hardware related complication or periprosthetic lucency or fracture. Postsurgical air and edema within the joint space and soft tissues. IMPRESSION: Right total knee arthroplasty without apparent hardware related complication. Electronically Signed   By: Kristine Garbe M.D.   On: 03/06/2017 16:42    Disposition: 06-Home-Health Care Svc    Follow-up Information    Mcarthur Rossetti, MD. Schedule an appointment as soon as possible for a visit in 2 week(s).   Specialty:  Orthopedic Surgery Contact information: [place] [address] [phone]            Signed: Erskine Emery 03/08/2017, 11:34 AM

## 2017-03-08 NOTE — Progress Notes (Signed)
Physical Therapy Treatment Patient Details Name: Julie Dougherty MRN: [id number] DOB: [birth year] Today's Date: 03/08/2017    History of Present Illness Admitted for RTKA, [name];  has a past medical history of Arthritis, Cataract, Chronic constipation, Diverticulitis, Diverticulosis, Fatty liver, GERD (gastroesophageal reflux disease), Hemorrhoids, internal, with bleeding Gr 2 prolapsed (06/30/2011), Hepatitis, Hiatal hernia, High cholesterol, Hypertension, Internal hemorrhoids, OSA (obstructive sleep apnea), Pre-diabetes, PUD (peptic ulcer disease), Rectal ulceration (04/27/2009), and Sleep apnea.    PT Comments    Patient continues to progress toward mobility goals and tolerated increased gait distance. Pt overall supervision/min guard for functional transfers and ambulation. Son present and interpreted during session. Current plan remains appropriate.    Follow Up Recommendations  DC plan and follow up therapy as arranged by surgeon     Equipment Recommendations  None recommended by PT(well-equipped from previous TKA)    Recommendations for Other Services OT consult     Precautions / Restrictions Precautions Precautions: Knee Precaution Booklet Issued: No Precaution Comments: precautions reviewed with pt and pt's son Restrictions Weight Bearing Restrictions: Yes RLE Weight Bearing: Weight bearing as tolerated    Mobility  Bed Mobility               General bed mobility comments: pt OOB in chair upon arrival  Transfers Overall transfer level: Needs assistance Equipment used: Rolling walker (2 wheeled) Transfers: Sit to/from Stand Sit to Stand: Min guard         General transfer comment: cues for safe hand placement  Ambulation/Gait Ambulation/Gait assistance: Supervision;Min guard Ambulation Distance (Feet): 150 Feet Assistive device: Rolling walker (2 wheeled) Gait Pattern/deviations: Step-through pattern(emerging) Gait velocity: decreased    General Gait Details: cues for proximity to RW and posture    Stairs            Wheelchair Mobility    Modified Rankin (Stroke Patients Only)       Balance Overall balance assessment: Needs assistance   Sitting balance-Leahy Scale: Good     Standing balance support: Bilateral upper extremity supported;During functional activity Standing balance-Leahy Scale: Poor                              Cognition Arousal/Alertness: Awake/alert Behavior During Therapy: WFL for tasks assessed/performed Overall Cognitive Status: Within Functional Limits for tasks assessed                                        Exercises Total Joint Exercises Ankle Circles/Pumps: AROM;Both;10 reps Quad Sets: AROM;Right;10 reps Short Arc QuadSinclair Ship;Right;10 reps Heel Slides: AAROM;Right;10 reps Hip ABduction/ADduction: AAROM;Right;10 reps Goniometric ROM: approx 80 degrees flexion    General Comments        Pertinent Vitals/Pain Pain Assessment: Faces Faces Pain Scale: Hurts even more Pain Location: R knee with therex Pain Descriptors / Indicators: Grimacing;Guarding;Sore Pain Intervention(s): Limited activity within patient's tolerance;Monitored during session;Repositioned;RN gave pain meds during session;Ice applied    Home Living                      Prior Function            PT Goals (current goals can now be found in the care plan section) Acute Rehab PT Goals PT Goal Formulation: With patient Time For Goal Achievement: 03/14/17 Potential to Achieve Goals: Good Progress towards PT  goals: Progressing toward goals    Frequency    7X/week      PT Plan Current plan remains appropriate    Co-evaluation              AM-PAC PT "6 Clicks" Daily Activity  Outcome Measure  Difficulty turning over in bed (including adjusting bedclothes, sheets and blankets)?: A Little Difficulty moving from lying on back to sitting on the side of  the bed? : A Little Difficulty sitting down on and standing up from a chair with arms (e.g., wheelchair, bedside commode, etc,.)?: A Little Help needed moving to and from a bed to chair (including a wheelchair)?: A Little Help needed walking in hospital room?: A Little Help needed climbing 3-5 steps with a railing? : A Lot 6 Click Score: 17    End of Session Equipment Utilized During Treatment: Gait belt Activity Tolerance: Patient tolerated treatment well Patient left: in chair;with call bell/phone within reach;with family/visitor present Nurse Communication: Mobility status PT Visit Diagnosis: Other abnormalities of gait and mobility (R26.89);Pain Pain - Right/Left: Right Pain - part of body: Knee     Time: 7096-4383 PT Time Calculation (min) (ACUTE ONLY): 25 min  Charges:  $Gait Training: 8-22 mins $Therapeutic Exercise: 8-22 mins                    G Codes:       Earney Navy, PTA Pager: [phone]     Darliss Cheney 03/08/2017, 9:53 AM

## 2017-03-09 ENCOUNTER — Telehealth (INDEPENDENT_AMBULATORY_CARE_PROVIDER_SITE_OTHER): Payer: Self-pay | Admitting: Radiology

## 2017-03-09 DIAGNOSIS — H269 Unspecified cataract: Secondary | ICD-10-CM | POA: Diagnosis not present

## 2017-03-09 DIAGNOSIS — K449 Diaphragmatic hernia without obstruction or gangrene: Secondary | ICD-10-CM | POA: Diagnosis not present

## 2017-03-09 DIAGNOSIS — K760 Fatty (change of) liver, not elsewhere classified: Secondary | ICD-10-CM | POA: Diagnosis not present

## 2017-03-09 DIAGNOSIS — Z471 Aftercare following joint replacement surgery: Secondary | ICD-10-CM | POA: Diagnosis not present

## 2017-03-09 DIAGNOSIS — I10 Essential (primary) hypertension: Secondary | ICD-10-CM | POA: Diagnosis not present

## 2017-03-09 DIAGNOSIS — G4733 Obstructive sleep apnea (adult) (pediatric): Secondary | ICD-10-CM | POA: Diagnosis not present

## 2017-03-09 NOTE — Telephone Encounter (Signed)
Julie Dougherty is needing verbal orders for PT 2x per week for 1 week, 3x per week for 1 week. Please call her back to advise.

## 2017-03-10 DIAGNOSIS — G4733 Obstructive sleep apnea (adult) (pediatric): Secondary | ICD-10-CM | POA: Diagnosis not present

## 2017-03-10 DIAGNOSIS — Z471 Aftercare following joint replacement surgery: Secondary | ICD-10-CM | POA: Diagnosis not present

## 2017-03-10 DIAGNOSIS — K760 Fatty (change of) liver, not elsewhere classified: Secondary | ICD-10-CM | POA: Diagnosis not present

## 2017-03-10 DIAGNOSIS — H269 Unspecified cataract: Secondary | ICD-10-CM | POA: Diagnosis not present

## 2017-03-10 DIAGNOSIS — I10 Essential (primary) hypertension: Secondary | ICD-10-CM | POA: Diagnosis not present

## 2017-03-10 DIAGNOSIS — K449 Diaphragmatic hernia without obstruction or gangrene: Secondary | ICD-10-CM | POA: Diagnosis not present

## 2017-03-11 DIAGNOSIS — K760 Fatty (change of) liver, not elsewhere classified: Secondary | ICD-10-CM | POA: Diagnosis not present

## 2017-03-11 DIAGNOSIS — G4733 Obstructive sleep apnea (adult) (pediatric): Secondary | ICD-10-CM | POA: Diagnosis not present

## 2017-03-11 DIAGNOSIS — K449 Diaphragmatic hernia without obstruction or gangrene: Secondary | ICD-10-CM | POA: Diagnosis not present

## 2017-03-11 DIAGNOSIS — I10 Essential (primary) hypertension: Secondary | ICD-10-CM | POA: Diagnosis not present

## 2017-03-11 DIAGNOSIS — Z471 Aftercare following joint replacement surgery: Secondary | ICD-10-CM | POA: Diagnosis not present

## 2017-03-11 DIAGNOSIS — H269 Unspecified cataract: Secondary | ICD-10-CM | POA: Diagnosis not present

## 2017-03-12 NOTE — Telephone Encounter (Signed)
Verbal order given  

## 2017-03-14 DIAGNOSIS — K449 Diaphragmatic hernia without obstruction or gangrene: Secondary | ICD-10-CM | POA: Diagnosis not present

## 2017-03-14 DIAGNOSIS — K760 Fatty (change of) liver, not elsewhere classified: Secondary | ICD-10-CM | POA: Diagnosis not present

## 2017-03-14 DIAGNOSIS — H269 Unspecified cataract: Secondary | ICD-10-CM | POA: Diagnosis not present

## 2017-03-14 DIAGNOSIS — Z471 Aftercare following joint replacement surgery: Secondary | ICD-10-CM | POA: Diagnosis not present

## 2017-03-14 DIAGNOSIS — G4733 Obstructive sleep apnea (adult) (pediatric): Secondary | ICD-10-CM | POA: Diagnosis not present

## 2017-03-14 DIAGNOSIS — I10 Essential (primary) hypertension: Secondary | ICD-10-CM | POA: Diagnosis not present

## 2017-03-16 DIAGNOSIS — K449 Diaphragmatic hernia without obstruction or gangrene: Secondary | ICD-10-CM | POA: Diagnosis not present

## 2017-03-16 DIAGNOSIS — H269 Unspecified cataract: Secondary | ICD-10-CM | POA: Diagnosis not present

## 2017-03-16 DIAGNOSIS — G4733 Obstructive sleep apnea (adult) (pediatric): Secondary | ICD-10-CM | POA: Diagnosis not present

## 2017-03-16 DIAGNOSIS — Z471 Aftercare following joint replacement surgery: Secondary | ICD-10-CM | POA: Diagnosis not present

## 2017-03-16 DIAGNOSIS — I10 Essential (primary) hypertension: Secondary | ICD-10-CM | POA: Diagnosis not present

## 2017-03-16 DIAGNOSIS — K760 Fatty (change of) liver, not elsewhere classified: Secondary | ICD-10-CM | POA: Diagnosis not present

## 2017-03-19 ENCOUNTER — Encounter (INDEPENDENT_AMBULATORY_CARE_PROVIDER_SITE_OTHER): Payer: Self-pay | Admitting: Physician Assistant

## 2017-03-19 ENCOUNTER — Ambulatory Visit (INDEPENDENT_AMBULATORY_CARE_PROVIDER_SITE_OTHER): Payer: Medicare Other | Admitting: Physician Assistant

## 2017-03-19 DIAGNOSIS — Z96651 Presence of right artificial knee joint: Secondary | ICD-10-CM

## 2017-03-19 MED ORDER — HYDROCODONE-ACETAMINOPHEN 5-325 MG PO TABS: 1.0000 | ORAL_TABLET | ORAL | 0 refills | Status: DC | PRN

## 2017-03-19 NOTE — Progress Notes (Signed)
HPI: [name] returns today status post right total knee arthroplasty.  She is overall doing well.  She had no shortness of breath fevers chills.  She states this knee is a little bit more painful than the left knee which she is now 7 months out from.  She is finished with home health PT.  She is ready to advance to outpatient therapy.  Needs a refill on her hydrocodone.  Physical exam: Right knee full extension flexion to 90 degrees.  No instability.  Surgical incisions healing well no signs of infection.  Calf supple nontender.  Impression: 2 weeks status post right total knee arthroplasty.  Plan: She will go to outpatient therapy to work on range of motion strengthening the knee.  Scar tissue mobilization encouraged.  Questions encouraged and answered refill on hydrocodone was given today.

## 2017-03-20 ENCOUNTER — Inpatient Hospital Stay (INDEPENDENT_AMBULATORY_CARE_PROVIDER_SITE_OTHER): Payer: Medicare Other | Admitting: Orthopaedic Surgery

## 2017-03-20 ENCOUNTER — Other Ambulatory Visit (INDEPENDENT_AMBULATORY_CARE_PROVIDER_SITE_OTHER): Payer: Self-pay

## 2017-03-20 DIAGNOSIS — Z96651 Presence of right artificial knee joint: Secondary | ICD-10-CM

## 2017-04-09 ENCOUNTER — Encounter: Payer: Self-pay | Admitting: Physical Therapy

## 2017-04-09 ENCOUNTER — Other Ambulatory Visit: Payer: Self-pay

## 2017-04-09 ENCOUNTER — Ambulatory Visit: Payer: Medicare Other | Attending: Orthopaedic Surgery | Admitting: Physical Therapy

## 2017-04-09 DIAGNOSIS — R262 Difficulty in walking, not elsewhere classified: Secondary | ICD-10-CM

## 2017-04-09 DIAGNOSIS — R600 Localized edema: Secondary | ICD-10-CM | POA: Diagnosis not present

## 2017-04-09 DIAGNOSIS — M25561 Pain in right knee: Secondary | ICD-10-CM | POA: Diagnosis not present

## 2017-04-09 DIAGNOSIS — M25661 Stiffness of right knee, not elsewhere classified: Secondary | ICD-10-CM | POA: Diagnosis not present

## 2017-04-09 NOTE — Therapy (Signed)
[place] [address] [place], [place], [address] Phone: [phone]   Fax:  [phone]  Physical Therapy Evaluation  Patient Details  Name: Julie Dougherty MRN: [id number] Date of Birth: 11-Oct-1950 Referring Provider: Ninfa Linden   Encounter Date: 04/09/2017  PT End of Session - 04/09/17 0945    Visit Number  1    Date for PT Re-Evaluation  06/09/17    Authorization Type  Medicare    PT Start Time  0924    PT Stop Time  1020    PT Time Calculation (min)  56 min    Activity Tolerance  Patient tolerated treatment well    Behavior During Therapy  [facility] for tasks assessed/performed       Past Medical History:  Diagnosis Date  . Arthritis   . Cataract   . Chronic constipation   . Diverticulitis   . Diverticulosis   . Fatty liver   . GERD (gastroesophageal reflux disease)   . Hemorrhoids, internal, with bleeding Gr 2 prolapsed 06/30/2011   Found on colonoscopy 2011 and anoscopy May 2013   . Hepatitis    "B"    no rx >10 yrs  . Hiatal hernia   . High cholesterol   . Hypertension   . Internal hemorrhoids   . OSA (obstructive sleep apnea)   . Pre-diabetes   . PUD (peptic ulcer disease)   . Rectal ulceration 04/27/2009   Associated reactive/regenerative changes and fibromuscular extensions into lamina propria/Mucosal Prolapse  . Sleep apnea    Waiting on CPAP machine    Past Surgical History:  Procedure Laterality Date  . ABDOMINAL HYSTERECTOMY    . COLONOSCOPY  04/27/09    POLYPOID FRAGMENT OF COLONIC MUCOSA WITH SURFACE  . HEMORRHOID BANDING    . TOTAL KNEE ARTHROPLASTY Left 08/29/2016   Procedure: LEFT TOTAL KNEE ARTHROPLASTY;  Surgeon: Mcarthur Rossetti, MD;  Location: [place];  Service: Orthopedics;  Laterality: Left;  . TOTAL KNEE ARTHROPLASTY Right 03/06/2017  . TOTAL KNEE ARTHROPLASTY Right 03/06/2017   Procedure: RIGHT TOTAL KNEE ARTHROPLASTY;  Surgeon: Mcarthur Rossetti, MD;   Location: [place];  Service: Orthopedics;  Laterality: Right;  . TUBAL LIGATION      There were no vitals filed for this visit.   Subjective Assessment - 04/09/17 0927    Subjective  Patient underwent a right TKR on 03/06/17.  She had some home PT and did well.  She reports stiff and sore.  She had the left TKR done in July and did very well.    Limitations  Standing;Walking;House hold activities    Patient Stated Goals  have less pain and normal ROM    Currently in Pain?  Yes    Pain Score  4     Pain Location  Knee    Pain Orientation  Right    Pain Descriptors / Indicators  Aching;Sore;Tightness    Pain Type  Acute pain;Surgical pain    Pain Onset  More than a month ago    Pain Frequency  Constant    Aggravating Factors   bending, standing and walking pain up to 8/10, cold weather very painful    Pain Relieving Factors  rest pain down to a 4/10    Effect of Pain on Daily Activities  limits walking and all ADL's         [facility] PT Assessment - 04/09/17 0001      Assessment  Medical Diagnosis  Right TKR    Referring Provider  Ninfa Linden    Onset Date/Surgical Date  03/06/17    Prior Therapy  for left TKR in July      Precautions   Precautions  None      Balance Screen   Has the patient fallen in the past 6 months  No    Has the patient had a decrease in activity level because of a fear of falling?   No    Is the patient reluctant to leave their home because of a fear of falling?   No      Home Environment   Additional Comments  has stairs, does housework      Observation/Other Assessments-Edema    Edema  Circumferential      Circumferential Edema   Circumferential - Right  51 cm mid patella    Circumferential - Left   46 cm      ROM / Strength   AROM / PROM / Strength  AROM;PROM;Strength      AROM   AROM Assessment Site  Knee    Right/Left Knee  Right    Right Knee Extension  25    Right Knee Flexion  89      PROM   Overall PROM Comments  pain with passive  motions    PROM Assessment Site  Knee    Right/Left Knee  Right    Right Knee Extension  11    Right Knee Flexion  96      Strength   Overall Strength Comments  4/5 with some pain      Palpation   Palpation comment  she is very swollen, there is pain and tenderness in the anterior knee, warm to touch      Ambulation/Gait   Gait Comments  uses a SPC, slow, stiff legged on the right, antalgic on the right      Standardized Balance Assessment   Standardized Balance Assessment  Timed Up and Go Test      Timed Up and Go Test   Normal TUG (seconds)  23             Objective measurements completed on examination: See above findings.      [facility] Adult PT Treatment/Exercise - 04/09/17 0001      Exercises   Exercises  Knee/Hip      Knee/Hip Exercises: Aerobic   Nustep  level 3 x 6 minutes      Modalities   Modalities  Vasopneumatic      Vasopneumatic   Number Minutes Vasopneumatic   15 minutes    Vasopnuematic Location   Knee    Vasopneumatic Pressure  Medium    Vasopneumatic Temperature   33             PT Education - 04/09/17 0944    Education provided  Yes    Education Details  HEP for low load long duration stretches for extension and flexion    Person(s) Educated  Patient    Methods  Explanation;Demonstration;Handout    Comprehension  Verbalized understanding       PT Short Term Goals - 04/09/17 0956      PT SHORT TERM GOAL #1   Title  independent with initial HEP    Time  2    Period  Weeks    Status  New        PT Long Term Goals - 04/09/17 7124  PT LONG TERM GOAL #1   Title  decrease pain 50%    Time  8    Period  Weeks    Status  New      PT LONG TERM GOAL #2   Title  walk without device and minimal deviation    Time  8    Period  Weeks    Status  New      PT LONG TERM GOAL #3   Title  increase AROM to 0-115 degrees flexion    Time  8    Period  Weeks    Status  New      PT LONG TERM GOAL #4   Title  decrease  swelling 50%    Period  Weeks    Status  New      PT LONG TERM GOAL #5   Title  ascend and descend stairs step over step    Time  8    Period  Weeks    Status  New             Plan - 04/09/17 0945    Clinical Impression Statement  Patient underwent a right TKR on 03/06/17.  She reports that it is very stiff and swollen, reports that the cold weather really makes it worse.  Her AROM was 24-88 degrees flexion, she is very painful with any PROM.  She had about 5 cm of swelling in the right knee compared to the left.    Clinical Presentation  Evolving    Clinical Decision Making  Low    Rehab Potential  Good    PT Frequency  3x / week    PT Duration  8 weeks    PT Treatment/Interventions  ADLs/Self Care Home Management;Cryotherapy;Electrical Stimulation;Gait training;Balance training;Therapeutic exercise;Therapeutic activities;Functional mobility training;Stair training;Patient/family education;Manual techniques;Vasopneumatic Device    PT Next Visit Plan  slowly progress with TKR    Consulted and Agree with Plan of Care  Patient       Patient will benefit from skilled therapeutic intervention in order to improve the following deficits and impairments:  Abnormal gait, Decreased range of motion, Difficulty walking, Decreased activity tolerance, Pain, Decreased scar mobility, Impaired flexibility, Increased edema, Decreased strength, Decreased mobility  Visit Diagnosis: Acute pain of right knee - Plan: PT plan of care cert/re-cert  Stiffness of right knee, not elsewhere classified - Plan: PT plan of care cert/re-cert  Difficulty in walking, not elsewhere classified - Plan: PT plan of care cert/re-cert  Localized edema - Plan: PT plan of care cert/re-cert     Problem List Patient Active Problem List   Diagnosis Date Noted  . Unilateral primary osteoarthritis, right knee 03/06/2017  . 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 Status post total knee replacement,  left 08/29/2016  . Unilateral primary osteoarthritis, left knee 07/17/2016  . Acute pain of right knee 06/28/2016  . Baker's cyst of knee, left - suspected 05/30/2016  . Diverticulitis of colon 03/17/2016  . Hemorrhoids, internal, with bleeding Gr 2 prolapsed 06/30/2011  . Diverticulitis 06/06/2011  . Rectal bleeding 04/19/2009  . ABDOMINAL PAIN, RIGHT LOWER QUADRANT 04/19/2009  . HYPERLIPIDEMIA 07/11/2006  . HYPERTENSION, ESSENTIAL NOS 07/11/2006  . GERD 07/11/2006  . HEPATITIS B, HX OF 07/11/2006    ALBRIGHT,MICHAEL W.,PT 04/09/2017, 9:59 AM  [address] [place], [place], [address] Phone: [phone]   Fax:  [phone]  Name: Julie Dougherty  MRN: [id number] Date of Birth: [birth year]

## 2017-04-16 ENCOUNTER — Encounter (INDEPENDENT_AMBULATORY_CARE_PROVIDER_SITE_OTHER): Payer: Self-pay | Admitting: Physician Assistant

## 2017-04-16 ENCOUNTER — Ambulatory Visit (INDEPENDENT_AMBULATORY_CARE_PROVIDER_SITE_OTHER): Payer: Medicare Other | Admitting: Physician Assistant

## 2017-04-16 DIAGNOSIS — Z96651 Presence of right artificial knee joint: Secondary | ICD-10-CM

## 2017-04-16 MED ORDER — HYDROCODONE-ACETAMINOPHEN 5-325 MG PO TABS: 1.0000 | ORAL_TABLET | ORAL | 0 refills | Status: DC | PRN

## 2017-04-16 NOTE — Progress Notes (Signed)
HPI: [name] returns today 6 weeks status post right total knee arthroplasty.  She states she is having some pain in the knee just started physical therapy last week.  She denies any fevers chills shortness of breath chest pain.  She does present today with an interpreter.  She is asking for refill on her hydrocodone.  Physical exam: Right knee surgical incisions healing well.  No instability valgus varus stressing.  Full extension flexion to 95 degrees calf supple.  Impression: Status post right total knee arthroplasty 6 weeks  Plan: Refill on hydrocodone was given today.  She will follow with [place] in 1 month check her progress lack of.  She is work on range of motion of the knee particularly flexion.  I would like for her to be 110 115 degrees at next visit.  Questions were encouraged and answered at length today.

## 2017-04-17 ENCOUNTER — Ambulatory Visit: Payer: Medicare Other | Admitting: Physical Therapy

## 2017-04-17 ENCOUNTER — Encounter: Payer: Self-pay | Admitting: Physical Therapy

## 2017-04-17 DIAGNOSIS — R262 Difficulty in walking, not elsewhere classified: Secondary | ICD-10-CM | POA: Diagnosis not present

## 2017-04-17 DIAGNOSIS — R600 Localized edema: Secondary | ICD-10-CM | POA: Diagnosis not present

## 2017-04-17 DIAGNOSIS — M25661 Stiffness of right knee, not elsewhere classified: Secondary | ICD-10-CM | POA: Diagnosis not present

## 2017-04-17 DIAGNOSIS — M25561 Pain in right knee: Secondary | ICD-10-CM | POA: Diagnosis not present

## 2017-04-17 NOTE — Therapy (Signed)
[place], [place], [address] Phone: [phone]   Fax:  [phone]  Physical Therapy Treatment  Patient Details  Name: gennie eisinger MRN: [id number] Date of Birth: 1950/10/25 Referring Provider: Ninfa Linden   Encounter Date: 04/17/2017  PT End of Session - 04/17/17 0829    Visit Number  2    Date for PT Re-Evaluation  06/09/17    Authorization Type  Medicare    PT Start Time  0800    PT Stop Time  7681    PT Time Calculation (min)  55 min       Past Medical History:  Diagnosis Date  . Arthritis   . Cataract   . Chronic constipation   . Diverticulitis   . Diverticulosis   . Fatty liver   . GERD (gastroesophageal reflux disease)   . Hemorrhoids, internal, with bleeding Gr 2 prolapsed 06/30/2011   Found on colonoscopy 2011 and anoscopy May 2013   . Hepatitis    "B"    no rx >10 yrs  . Hiatal hernia   . High cholesterol   . Hypertension   . Internal hemorrhoids   . OSA (obstructive sleep apnea)   . Pre-diabetes   . PUD (peptic ulcer disease)   . Rectal ulceration 04/27/2009   Associated reactive/regenerative changes and fibromuscular extensions into lamina propria/Mucosal Prolapse  . Sleep apnea    Waiting on CPAP machine    Past Surgical History:  Procedure Laterality Date  . ABDOMINAL HYSTERECTOMY    . COLONOSCOPY  04/27/09    POLYPOID FRAGMENT OF COLONIC MUCOSA WITH SURFACE  . HEMORRHOID BANDING    . TOTAL KNEE ARTHROPLASTY Left 08/29/2016   Procedure: LEFT TOTAL KNEE ARTHROPLASTY;  Surgeon: Mcarthur Rossetti, MD;  Location: [facility];  Service: Orthopedics;  Laterality: Left;  . TOTAL KNEE ARTHROPLASTY Right 03/06/2017  . TOTAL KNEE ARTHROPLASTY Right 03/06/2017   Procedure: RIGHT TOTAL KNEE ARTHROPLASTY;  Surgeon: Mcarthur Rossetti, MD;  Location: [place];  Service: Orthopedics;  Laterality: Right;  . TUBAL LIGATION      There were no vitals filed for this  visit.  Subjective Assessment - 04/17/17 0803    Subjective  doing well ( amb in with [facility]) verb doing HEP from [place]    Patient is accompained by:  Family member;Interpreter    Currently in Pain?  Yes    Pain Score  5     Pain Location  Knee    Pain Orientation  Right         [facility] PT Assessment - 04/17/17 0001      AROM   AROM Assessment Site  Knee    Right/Left Knee  Right    Right Knee Extension  16    Right Knee Flexion  105                  [facility] Adult PT Treatment/Exercise - 04/17/17 0001      Ambulation/Gait   Gait Comments  gait instruction for heel strike and increasing ext       Knee/Hip Exercises: Aerobic   Recumbent Bike  5 min    Nustep  L 4 6 min LE only      Knee/Hip Exercises: Machines for Strengthening   Cybex Knee Extension  5# up with 2 then ecc RT lowering 2 sets 10    Cybex Knee Flexion  15# 2 sets 10 RT only  Total Gym Leg Press  20# 2 sets 10 BIL then 1 set RT only      Knee/Hip Exercises: Standing   Heel Raises  Both;20 reps;3 seconds black bar    Walking with Sports Cord  5 times backwards to increase ext    Other Standing Knee Exercises  TKE with ball on wall 20      Knee/Hip Exercises: Seated   Other Seated Knee/Hip Exercises  TKE red tband      Modalities   Modalities  Vasopneumatic      Vasopneumatic   Number Minutes Vasopneumatic   15 minutes    Vasopnuematic Location   Knee    Vasopneumatic Pressure  Medium    Vasopneumatic Temperature   33      Manual Therapy   Manual Therapy  Passive ROM    Passive ROM  to increase ext               PT Short Term Goals - 04/17/17 4081      PT SHORT TERM GOAL #1   Title  independent with initial HEP    Baseline  doing ex from HHPT    Status  Achieved        PT Long Term Goals - 04/09/17 0956      PT LONG TERM GOAL #1   Title  decrease pain 50%    Time  8    Period  Weeks    Status  New      PT LONG TERM GOAL #2   Title  walk without device and minimal  deviation    Time  8    Period  Weeks    Status  New      PT LONG TERM GOAL #3   Title  increase AROM to 0-115 degrees flexion    Time  8    Period  Weeks    Status  New      PT LONG TERM GOAL #4   Title  decrease swelling 50%    Period  Weeks    Status  New      PT LONG TERM GOAL #5   Title  ascend and descend stairs step over step    Time  8    Period  Weeks    Status  New            Plan - 04/17/17 4481    Clinical Impression Statement  STG met.Good increase in ROM- cuing with ex to achieve increased ROM esp ext. cuing with gait to increase ext    PT Treatment/Interventions  ADLs/Self Care Home Management;Cryotherapy;Electrical Stimulation;Gait training;Balance training;Therapeutic exercise;Therapeutic activities;Functional mobility training;Stair training;Patient/family education;Manual techniques;Vasopneumatic Device    PT Next Visit Plan  progress ROM ( esp ext) and strength       Patient will benefit from skilled therapeutic intervention in order to improve the following deficits and impairments:  Abnormal gait, Decreased range of motion, Difficulty walking, Decreased activity tolerance, Pain, Decreased scar mobility, Impaired flexibility, Increased edema, Decreased strength, Decreased mobility  Visit Diagnosis: Acute pain of right knee  Stiffness of right knee, not elsewhere classified  Difficulty in walking, not elsewhere classified  Localized edema     Problem List Patient Active Problem List   Diagnosis Date Noted  . Unilateral primary osteoarthritis, right knee 03/06/2017  . 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 Status post total knee replacement, left 08/29/2016  . Unilateral primary osteoarthritis, left knee 07/17/2016  .  Acute pain of right knee 06/28/2016  . Baker's cyst of knee, left - suspected 05/30/2016  . Diverticulitis of colon 03/17/2016  . Hemorrhoids, internal, with bleeding Gr 2 prolapsed 06/30/2011  .  Diverticulitis 06/06/2011  . Rectal bleeding 04/19/2009  . ABDOMINAL PAIN, RIGHT LOWER QUADRANT 04/19/2009  . HYPERLIPIDEMIA 07/11/2006  . HYPERTENSION, ESSENTIAL NOS 07/11/2006  . GERD 07/11/2006  . HEPATITIS B, HX OF 07/11/2006    Kanav Kazmierczak,ANGIE PTA 04/17/2017, 8:30 AM  [address] [place], [place], [address] Phone: [phone]   Fax:  [phone]  Name: ladrea holladay MRN: [id number] Date of Birth: 1951-01-05

## 2017-04-20 ENCOUNTER — Ambulatory Visit: Payer: Medicare Other | Admitting: Physical Therapy

## 2017-04-20 DIAGNOSIS — M25561 Pain in right knee: Secondary | ICD-10-CM

## 2017-04-20 DIAGNOSIS — R262 Difficulty in walking, not elsewhere classified: Secondary | ICD-10-CM

## 2017-04-20 DIAGNOSIS — M25661 Stiffness of right knee, not elsewhere classified: Secondary | ICD-10-CM | POA: Diagnosis not present

## 2017-04-20 DIAGNOSIS — R600 Localized edema: Secondary | ICD-10-CM

## 2017-04-20 NOTE — Therapy (Signed)
[place], [place], [address] Phone: [phone]   Fax:  [phone]  Physical Therapy Treatment  Patient Details  Name: Julie Dougherty MRN: [id number] Date of Birth: 1950-04-28 Referring Provider: Ninfa Linden   Encounter Date: 04/20/2017  PT End of Session - 04/20/17 0959    Visit Number  3    Date for PT Re-Evaluation  06/09/17    Authorization Type  Medicare    PT Start Time  0930    PT Stop Time  1025    PT Time Calculation (min)  55 min       Past Medical History:  Diagnosis Date  . Arthritis   . Cataract   . Chronic constipation   . Diverticulitis   . Diverticulosis   . Fatty liver   . GERD (gastroesophageal reflux disease)   . Hemorrhoids, internal, with bleeding Gr 2 prolapsed 06/30/2011   Found on colonoscopy 2011 and anoscopy May 2013   . Hepatitis    "B"    no rx >10 yrs  . Hiatal hernia   . High cholesterol   . Hypertension   . Internal hemorrhoids   . OSA (obstructive sleep apnea)   . Pre-diabetes   . PUD (peptic ulcer disease)   . Rectal ulceration 04/27/2009   Associated reactive/regenerative changes and fibromuscular extensions into lamina propria/Mucosal Prolapse  . Sleep apnea    Waiting on CPAP machine    Past Surgical History:  Procedure Laterality Date  . ABDOMINAL HYSTERECTOMY    . COLONOSCOPY  04/27/09    POLYPOID FRAGMENT OF COLONIC MUCOSA WITH SURFACE  . HEMORRHOID BANDING    . TOTAL KNEE ARTHROPLASTY Left 08/29/2016   Procedure: LEFT TOTAL KNEE ARTHROPLASTY;  Surgeon: Mcarthur Rossetti, MD;  Location: [facility];  Service: Orthopedics;  Laterality: Left;  . TOTAL KNEE ARTHROPLASTY Right 03/06/2017  . TOTAL KNEE ARTHROPLASTY Right 03/06/2017   Procedure: RIGHT TOTAL KNEE ARTHROPLASTY;  Surgeon: Mcarthur Rossetti, MD;  Location: [facility];  Service: Orthopedics;  Laterality: Right;  . TUBAL LIGATION      There were no vitals filed for this  visit.  Subjective Assessment - 04/20/17 0931    Subjective  doing pretty well    Patient is accompained by:  Family member    Currently in Pain?  Yes    Pain Score  4     Pain Location  Knee    Pain Orientation  Right    Pain Descriptors / Indicators  Aching;Sore;Tightness                      [facility] Adult PT Treatment/Exercise - 04/20/17 0001      Knee/Hip Exercises: Aerobic   Recumbent Bike  5 min    Nustep  L 4 6 min LE only      Knee/Hip Exercises: Machines for Strengthening   Cybex Knee Extension  5# up with 2 then ecc RT lowering 2 sets 10    Cybex Knee Flexion  15# 2 sets 10 RT only    Total Gym Leg Press  20# 2 sets 10 BIL then 1 set RT only calf raises 20# 2 sets 15      Knee/Hip Exercises: Standing   Lateral Step Up  Right;15 reps;Hand Hold: 2 BOSU    Forward Step Up  Right;15 reps;Hand Hold: 2 BOSU    Other Standing Knee Exercises  TKE with ball on  wall 20      Modalities   Modalities  Vasopneumatic      Vasopneumatic   Number Minutes Vasopneumatic   15 minutes    Vasopnuematic Location   Knee    Vasopneumatic Pressure  Medium    Vasopneumatic Temperature   33      Manual Therapy   Manual Therapy  Passive ROM    Passive ROM  to increase ext               PT Short Term Goals - 04/17/17 3474      PT SHORT TERM GOAL #1   Title  independent with initial HEP    Baseline  doing ex from HHPT    Status  Achieved        PT Long Term Goals - 04/09/17 0956      PT LONG TERM GOAL #1   Title  decrease pain 50%    Time  8    Period  Weeks    Status  New      PT LONG TERM GOAL #2   Title  walk without device and minimal deviation    Time  8    Period  Weeks    Status  New      PT LONG TERM GOAL #3   Title  increase AROM to 0-115 degrees flexion    Time  8    Period  Weeks    Status  New      PT LONG TERM GOAL #4   Title  decrease swelling 50%    Period  Weeks    Status  New      PT LONG TERM GOAL #5   Title  ascend and  descend stairs step over step    Time  8    Period  Weeks    Status  New            Plan - 04/20/17 2595    Clinical Impression Statement  focus on TKE and quad activation with ex- cuing needed    PT Treatment/Interventions  ADLs/Self Care Home Management;Cryotherapy;Electrical Stimulation;Gait training;Balance training;Therapeutic exercise;Therapeutic activities;Functional mobility training;Stair training;Patient/family education;Manual techniques;Vasopneumatic Device    PT Next Visit Plan  progress ROM ( esp ext) and strength       Patient will benefit from skilled therapeutic intervention in order to improve the following deficits and impairments:  Abnormal gait, Decreased range of motion, Difficulty walking, Decreased activity tolerance, Pain, Decreased scar mobility, Impaired flexibility, Increased edema, Decreased strength, Decreased mobility  Visit Diagnosis: Acute pain of right knee  Stiffness of right knee, not elsewhere classified  Difficulty in walking, not elsewhere classified  Localized edema     Problem List Patient Active Problem List   Diagnosis Date Noted  . Unilateral primary osteoarthritis, right knee 03/06/2017  . 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 Status post total knee replacement, left 08/29/2016  . Unilateral primary osteoarthritis, left knee 07/17/2016  . Acute pain of right knee 06/28/2016  . Baker's cyst of knee, left - suspected 05/30/2016  . Diverticulitis of colon 03/17/2016  . Hemorrhoids, internal, with bleeding Gr 2 prolapsed 06/30/2011  . Diverticulitis 06/06/2011  . Rectal bleeding 04/19/2009  . ABDOMINAL PAIN, RIGHT LOWER QUADRANT 04/19/2009  . HYPERLIPIDEMIA 07/11/2006  . HYPERTENSION, ESSENTIAL NOS 07/11/2006  . GERD 07/11/2006  . HEPATITIS B, HX OF 07/11/2006    PAYSEUR,ANGIE PTA 04/20/2017, 10:00 AM  [place]  [address] [place] [place],  [place], [address] Phone: [phone]   Fax:  [phone]  Name: Julie Dougherty MRN: [id number] Date of Birth: [birth year]

## 2017-04-23 ENCOUNTER — Ambulatory Visit: Payer: Medicare Other | Admitting: Physical Therapy

## 2017-04-23 ENCOUNTER — Encounter: Payer: Self-pay | Admitting: Physical Therapy

## 2017-04-23 DIAGNOSIS — M25661 Stiffness of right knee, not elsewhere classified: Secondary | ICD-10-CM

## 2017-04-23 DIAGNOSIS — M25561 Pain in right knee: Secondary | ICD-10-CM | POA: Diagnosis not present

## 2017-04-23 DIAGNOSIS — R262 Difficulty in walking, not elsewhere classified: Secondary | ICD-10-CM

## 2017-04-23 DIAGNOSIS — R600 Localized edema: Secondary | ICD-10-CM | POA: Diagnosis not present

## 2017-04-23 NOTE — Therapy (Signed)
[place], [place], [address] Phone: [phone]   Fax:  [phone]  Physical Therapy Treatment  Patient Details  Name: Julie Dougherty MRN: [id number] Date of Birth: 04/12/1950 Referring Provider: Ninfa Linden   Encounter Date: 04/23/2017  PT End of Session - 04/23/17 1012    Visit Number  4    Date for PT Re-Evaluation  06/09/17    PT Start Time  0939    PT Stop Time  1027    PT Time Calculation (min)  48 min    Activity Tolerance  Patient tolerated treatment well    Behavior During Therapy  [facility] for tasks assessed/performed       Past Medical History:  Diagnosis Date  . Arthritis   . Cataract   . Chronic constipation   . Diverticulitis   . Diverticulosis   . Fatty liver   . GERD (gastroesophageal reflux disease)   . Hemorrhoids, internal, with bleeding Gr 2 prolapsed 06/30/2011   Found on colonoscopy 2011 and anoscopy May 2013   . Hepatitis    "B"    no rx >10 yrs  . Hiatal hernia   . High cholesterol   . Hypertension   . Internal hemorrhoids   . OSA (obstructive sleep apnea)   . Pre-diabetes   . PUD (peptic ulcer disease)   . Rectal ulceration 04/27/2009   Associated reactive/regenerative changes and fibromuscular extensions into lamina propria/Mucosal Prolapse  . Sleep apnea    Waiting on CPAP machine    Past Surgical History:  Procedure Laterality Date  . ABDOMINAL HYSTERECTOMY    . COLONOSCOPY  04/27/09    POLYPOID FRAGMENT OF COLONIC MUCOSA WITH SURFACE  . HEMORRHOID BANDING    . TOTAL KNEE ARTHROPLASTY Left 08/29/2016   Procedure: LEFT TOTAL KNEE ARTHROPLASTY;  Surgeon: Mcarthur Rossetti, MD;  Location: [place];  Service: Orthopedics;  Laterality: Left;  . TOTAL KNEE ARTHROPLASTY Right 03/06/2017  . TOTAL KNEE ARTHROPLASTY Right 03/06/2017   Procedure: RIGHT TOTAL KNEE ARTHROPLASTY;  Surgeon: Mcarthur Rossetti, MD;  Location: [place];  Service:  Orthopedics;  Laterality: Right;  . TUBAL LIGATION      There were no vitals filed for this visit.  Subjective Assessment - 04/23/17 0940    Subjective  pretty well    Patient is accompained by:  Family member    Currently in Pain?  Yes    Pain Score  4     Pain Location  Knee    Pain Orientation  Right                No data recorded       [facility] Adult PT Treatment/Exercise - 04/23/17 0001      Knee/Hip Exercises: Aerobic   Recumbent Bike  5 min    Nustep  L 4 6 min LE only      Knee/Hip Exercises: Machines for Strengthening   Cybex Knee Extension  5# up with 2 then ecc RT lowering 2 sets 10    Cybex Knee Flexion  15# 2 sets 10 RT only    Total Gym Leg Press  20# 2 sets 10 BIL then 2 set RT only TKE      Knee/Hip Exercises: Standing   Forward Step Up  Right;1 set;10 reps;Hand Hold: 2;Step Height: 6"      Knee/Hip Exercises: Seated   Long Arc Quad  Right;1 set;10 reps  Modalities   Modalities  Vasopneumatic      Vasopneumatic   Number Minutes Vasopneumatic   15 minutes    Vasopnuematic Location   Knee    Vasopneumatic Pressure  Medium    Vasopneumatic Temperature   33      Manual Therapy   Manual Therapy  Passive ROM    Passive ROM  to increase ext               PT Short Term Goals - 04/17/17 0160      PT SHORT TERM GOAL #1   Title  independent with initial HEP    Baseline  doing ex from HHPT    Status  Achieved        PT Long Term Goals - 04/09/17 0956      PT LONG TERM GOAL #1   Title  decrease pain 50%    Time  8    Period  Weeks    Status  New      PT LONG TERM GOAL #2   Title  walk without device and minimal deviation    Time  8    Period  Weeks    Status  New      PT LONG TERM GOAL #3   Title  increase AROM to 0-115 degrees flexion    Time  8    Period  Weeks    Status  New      PT LONG TERM GOAL #4   Title  decrease swelling 50%    Period  Weeks    Status  New      PT LONG TERM GOAL #5   Title  ascend  and descend stairs step over step    Time  8    Period  Weeks    Status  New            Plan - 04/23/17 1013    Clinical Impression Statement  Pt ~ 10 minutes late for today's treatment, Pt able to complete all of today's interventions. Continues to focus more on ext. Not a lot of feed back but to pt's son her interpreter being on the phone most of the time    Rehab Potential  Good    PT Duration  8 weeks    PT Treatment/Interventions  ADLs/Self Care Home Management;Cryotherapy;Electrical Stimulation;Gait training;Balance training;Therapeutic exercise;Therapeutic activities;Functional mobility training;Stair training;Patient/family education;Manual techniques;Vasopneumatic Device       Patient will benefit from skilled therapeutic intervention in order to improve the following deficits and impairments:  Abnormal gait, Decreased range of motion, Difficulty walking, Decreased activity tolerance, Pain, Decreased scar mobility, Impaired flexibility, Increased edema, Decreased strength, Decreased mobility  Visit Diagnosis: Acute pain of right knee  Stiffness of right knee, not elsewhere classified  Difficulty in walking, not elsewhere classified  Localized edema     Problem List Patient Active Problem List   Diagnosis Date Noted  . Unilateral primary osteoarthritis, right knee 03/06/2017  . 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 Status post total knee replacement, left 08/29/2016  . Unilateral primary osteoarthritis, left knee 07/17/2016  . Acute pain of right knee 06/28/2016  . Baker's cyst of knee, left - suspected 05/30/2016  . Diverticulitis of colon 03/17/2016  . Hemorrhoids, internal, with bleeding Gr 2 prolapsed 06/30/2011  . Diverticulitis 06/06/2011  . Rectal bleeding 04/19/2009  . ABDOMINAL PAIN, RIGHT LOWER QUADRANT 04/19/2009  . HYPERLIPIDEMIA 07/11/2006  . HYPERTENSION, ESSENTIAL NOS 07/11/2006  . GERD  07/11/2006  . HEPATITIS B, HX OF  07/11/2006    Scot Jun, PTA 04/23/2017, 10:15 AM  [address] [place], [place], [address] Phone: [phone]   Fax:  [phone]  Name: Julie Dougherty MRN: [id number] Date of Birth: 04/10/1950

## 2017-04-24 DIAGNOSIS — Z13220 Encounter for screening for lipoid disorders: Secondary | ICD-10-CM | POA: Diagnosis not present

## 2017-04-24 DIAGNOSIS — R7309 Other abnormal glucose: Secondary | ICD-10-CM | POA: Diagnosis not present

## 2017-04-24 DIAGNOSIS — R6889 Other general symptoms and signs: Secondary | ICD-10-CM | POA: Diagnosis not present

## 2017-04-24 DIAGNOSIS — R7989 Other specified abnormal findings of blood chemistry: Secondary | ICD-10-CM | POA: Diagnosis not present

## 2017-04-24 DIAGNOSIS — R946 Abnormal results of thyroid function studies: Secondary | ICD-10-CM | POA: Diagnosis not present

## 2017-04-24 DIAGNOSIS — G4733 Obstructive sleep apnea (adult) (pediatric): Secondary | ICD-10-CM | POA: Diagnosis not present

## 2017-04-24 DIAGNOSIS — E785 Hyperlipidemia, unspecified: Secondary | ICD-10-CM | POA: Diagnosis not present

## 2017-04-24 DIAGNOSIS — I10 Essential (primary) hypertension: Secondary | ICD-10-CM | POA: Diagnosis not present

## 2017-04-24 DIAGNOSIS — K219 Gastro-esophageal reflux disease without esophagitis: Secondary | ICD-10-CM | POA: Diagnosis not present

## 2017-04-25 ENCOUNTER — Ambulatory Visit: Payer: Medicare Other | Admitting: Physical Therapy

## 2017-04-25 ENCOUNTER — Encounter: Payer: Self-pay | Admitting: Physical Therapy

## 2017-04-25 DIAGNOSIS — M25561 Pain in right knee: Secondary | ICD-10-CM

## 2017-04-25 DIAGNOSIS — R600 Localized edema: Secondary | ICD-10-CM | POA: Diagnosis not present

## 2017-04-25 DIAGNOSIS — R262 Difficulty in walking, not elsewhere classified: Secondary | ICD-10-CM

## 2017-04-25 DIAGNOSIS — M25661 Stiffness of right knee, not elsewhere classified: Secondary | ICD-10-CM | POA: Diagnosis not present

## 2017-04-25 NOTE — Therapy (Signed)
[place], [place], [address] Phone: [phone]   Fax:  [phone]  Physical Therapy Treatment  Patient Details  Name: Julie Dougherty MRN: [id number] Date of Birth: 1950/02/18 Referring Provider: Ninfa Linden   Encounter Date: 04/25/2017  PT End of Session - 04/25/17 1011    Visit Number  5    Date for PT Re-Evaluation  06/09/17    PT Start Time  0930    PT Stop Time  1026    PT Time Calculation (min)  56 min    Activity Tolerance  Patient tolerated treatment well    Behavior During Therapy  [facility] for tasks assessed/performed       Past Medical History:  Diagnosis Date  . Arthritis   . Cataract   . Chronic constipation   . Diverticulitis   . Diverticulosis   . Fatty liver   . GERD (gastroesophageal reflux disease)   . Hemorrhoids, internal, with bleeding Gr 2 prolapsed 06/30/2011   Found on colonoscopy 2011 and anoscopy May 2013   . Hepatitis    "B"    no rx >10 yrs  . Hiatal hernia   . High cholesterol   . Hypertension   . Internal hemorrhoids   . OSA (obstructive sleep apnea)   . Pre-diabetes   . PUD (peptic ulcer disease)   . Rectal ulceration 04/27/2009   Associated reactive/regenerative changes and fibromuscular extensions into lamina propria/Mucosal Prolapse  . Sleep apnea    Waiting on CPAP machine    Past Surgical History:  Procedure Laterality Date  . ABDOMINAL HYSTERECTOMY    . COLONOSCOPY  04/27/09    POLYPOID FRAGMENT OF COLONIC MUCOSA WITH SURFACE  . HEMORRHOID BANDING    . TOTAL KNEE ARTHROPLASTY Left 08/29/2016   Procedure: LEFT TOTAL KNEE ARTHROPLASTY;  Surgeon: Mcarthur Rossetti, MD;  Location: [place];  Service: Orthopedics;  Laterality: Left;  . TOTAL KNEE ARTHROPLASTY Right 03/06/2017  . TOTAL KNEE ARTHROPLASTY Right 03/06/2017   Procedure: RIGHT TOTAL KNEE ARTHROPLASTY;  Surgeon: Mcarthur Rossetti, MD;  Location: [place];  Service:  Orthopedics;  Laterality: Right;  . TUBAL LIGATION      There were no vitals filed for this visit.  Subjective Assessment - 04/25/17 0932    Subjective  "So So"    Currently in Pain?  Yes    Pain Score  3     Pain Location  Knee    Pain Orientation  Right         [facility] PT Assessment - 04/25/17 0001      AROM   AROM Assessment Site  Knee    Right/Left Knee  Right    Right Knee Extension  13    Right Knee Flexion  97            No data recorded       [facility] Adult PT Treatment/Exercise - 04/25/17 0001      Knee/Hip Exercises: Aerobic   Recumbent Bike  4 min    Nustep  L 4 6 min      Knee/Hip Exercises: Machines for Strengthening   Cybex Knee Extension  10lb 2x10; 5lb 2x10 RLE     Cybex Knee Flexion  20# 2 sets 10, 15lb RT only 2x10    Total Gym Leg Press  20# 2 sets 10 BIL then 2 set RT only TKE      Knee/Hip Exercises: Standing  Forward Step Up  Right;10 reps;Step Height: 6";Hand Hold: 0;2 sets      Modalities   Modalities  Vasopneumatic      Vasopneumatic   Number Minutes Vasopneumatic   15 minutes    Vasopnuematic Location   Knee    Vasopneumatic Pressure  Medium    Vasopneumatic Temperature   33      Manual Therapy   Manual Therapy  Passive ROM    Passive ROM  to increase ext               PT Short Term Goals - 04/17/17 0865      PT SHORT TERM GOAL #1   Title  independent with initial HEP    Baseline  doing ex from HHPT    Status  Achieved        PT Long Term Goals - 04/09/17 0956      PT LONG TERM GOAL #1   Title  decrease pain 50%    Time  8    Period  Weeks    Status  New      PT LONG TERM GOAL #2   Title  walk without device and minimal deviation    Time  8    Period  Weeks    Status  New      PT LONG TERM GOAL #3   Title  increase AROM to 0-115 degrees flexion    Time  8    Period  Weeks    Status  New      PT LONG TERM GOAL #4   Title  decrease swelling 50%    Period  Weeks    Status  New      PT LONG  TERM GOAL #5   Title  ascend and descend stairs step over step    Time  8    Period  Weeks    Status  New            Plan - 04/25/17 1011    Clinical Impression Statement  Pt tolerated today's activity well. She does report increase fatigue with more RLE isolation. Not much improvement with R knee AROM.     Rehab Potential  Good    PT Frequency  3x / week    PT Duration  8 weeks    PT Treatment/Interventions  ADLs/Self Care Home Management;Cryotherapy;Electrical Stimulation;Gait training;Balance training;Therapeutic exercise;Therapeutic activities;Functional mobility training;Stair training;Patient/family education;Manual techniques;Vasopneumatic Device    PT Next Visit Plan  progress ROM ( esp ext) and strength       Patient will benefit from skilled therapeutic intervention in order to improve the following deficits and impairments:  Abnormal gait, Decreased range of motion, Difficulty walking, Decreased activity tolerance, Pain, Decreased scar mobility, Impaired flexibility, Increased edema, Decreased strength, Decreased mobility  Visit Diagnosis: Stiffness of right knee, not elsewhere classified  Acute pain of right knee  Difficulty in walking, not elsewhere classified     Problem List Patient Active Problem List   Diagnosis Date Noted  . Unilateral primary osteoarthritis, right knee 03/06/2017  . 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 Status post total knee replacement, left 08/29/2016  . Unilateral primary osteoarthritis, left knee 07/17/2016  . Acute pain of right knee 06/28/2016  . Baker's cyst of knee, left - suspected 05/30/2016  . Diverticulitis of colon 03/17/2016  . Hemorrhoids, internal, with bleeding Gr 2 prolapsed 06/30/2011  . Diverticulitis 06/06/2011  . Rectal bleeding 04/19/2009  . ABDOMINAL PAIN,  RIGHT LOWER QUADRANT 04/19/2009  . HYPERLIPIDEMIA 07/11/2006  . HYPERTENSION, ESSENTIAL NOS 07/11/2006  . GERD 07/11/2006  . HEPATITIS  B, HX OF 07/11/2006    Scot Jun, PTA 04/25/2017, 10:12 AM  [place] [address] [place], [place], [address] Phone: [phone]   Fax:  [phone]  Name: Julie Dougherty MRN: [id number] Date of Birth: [birth year]

## 2017-05-01 ENCOUNTER — Encounter: Payer: Self-pay | Admitting: Physical Therapy

## 2017-05-01 ENCOUNTER — Ambulatory Visit: Payer: Medicare Other | Attending: Orthopaedic Surgery | Admitting: Physical Therapy

## 2017-05-01 DIAGNOSIS — R600 Localized edema: Secondary | ICD-10-CM | POA: Insufficient documentation

## 2017-05-01 DIAGNOSIS — M25661 Stiffness of right knee, not elsewhere classified: Secondary | ICD-10-CM

## 2017-05-01 DIAGNOSIS — R262 Difficulty in walking, not elsewhere classified: Secondary | ICD-10-CM | POA: Insufficient documentation

## 2017-05-01 DIAGNOSIS — M25561 Pain in right knee: Secondary | ICD-10-CM | POA: Insufficient documentation

## 2017-05-01 NOTE — Therapy (Signed)
[place], [place], [address] Phone: [phone]   Fax:  [phone]  Physical Therapy Treatment  Patient Details  Name: Julie Dougherty MRN: [id number] Date of Birth: 1950/08/24 Referring Provider: Ninfa Linden   Encounter Date: 05/01/2017  PT End of Session - 05/01/17 0841    Visit Number  6    Date for PT Re-Evaluation  06/09/17    PT Start Time  0804    PT Stop Time  0858    PT Time Calculation (min)  54 min    Activity Tolerance  Patient tolerated treatment well    Behavior During Therapy  [facility] for tasks assessed/performed       Past Medical History:  Diagnosis Date  . Arthritis   . Cataract   . Chronic constipation   . Diverticulitis   . Diverticulosis   . Fatty liver   . GERD (gastroesophageal reflux disease)   . Hemorrhoids, internal, with bleeding Gr 2 prolapsed 06/30/2011   Found on colonoscopy 2011 and anoscopy May 2013   . Hepatitis    "B"    no rx >10 yrs  . Hiatal hernia   . High cholesterol   . Hypertension   . Internal hemorrhoids   . OSA (obstructive sleep apnea)   . Pre-diabetes   . PUD (peptic ulcer disease)   . Rectal ulceration 04/27/2009   Associated reactive/regenerative changes and fibromuscular extensions into lamina propria/Mucosal Prolapse  . Sleep apnea    Waiting on CPAP machine    Past Surgical History:  Procedure Laterality Date  . ABDOMINAL HYSTERECTOMY    . COLONOSCOPY  04/27/09    POLYPOID FRAGMENT OF COLONIC MUCOSA WITH SURFACE  . HEMORRHOID BANDING    . TOTAL KNEE ARTHROPLASTY Left 08/29/2016   Procedure: LEFT TOTAL KNEE ARTHROPLASTY;  Surgeon: Mcarthur Rossetti, MD;  Location: [place];  Service: Orthopedics;  Laterality: Left;  . TOTAL KNEE ARTHROPLASTY Right 03/06/2017  . TOTAL KNEE ARTHROPLASTY Right 03/06/2017   Procedure: RIGHT TOTAL KNEE ARTHROPLASTY;  Surgeon: Mcarthur Rossetti, MD;  Location: [place];  Service:  Orthopedics;  Laterality: Right;  . TUBAL LIGATION      There were no vitals filed for this visit.  Subjective Assessment - 05/01/17 0807    Subjective  "So So"    Currently in Pain?  Yes    Pain Score  5     Pain Location  Knee    Pain Orientation  Right         [facility] PT Assessment - 05/01/17 0001      AROM   AROM Assessment Site  Knee    Right/Left Knee  Right    Right Knee Extension  10    Right Knee Flexion  100                   [facility] Adult PT Treatment/Exercise - 05/01/17 0001      Knee/Hip Exercises: Aerobic   Recumbent Bike  4 min    Nustep  L 3 6 min LE only      Knee/Hip Exercises: Machines for Strengthening   Total Gym Leg Press  20lb RLE TKE 2x10       Knee/Hip Exercises: Standing   Lateral Step Up  Right;10 reps;Hand Hold: 0;2 sets;Step Height: 6"    Walking with Sports Cord  5 times backwards to increase ext 30lb       Knee/Hip Exercises:  Seated   Long Arc Quad  Right;10 reps;2 sets;Weights    Long Arc Quad Weight  2 lbs.      Modalities   Modalities  Vasopneumatic      Vasopneumatic   Number Minutes Vasopneumatic   15 minutes    Vasopnuematic Location   Knee    Vasopneumatic Pressure  Medium    Vasopneumatic Temperature   33      Manual Therapy   Manual Therapy  Passive ROM    Passive ROM  to increase ext               PT Short Term Goals - 04/17/17 0277      PT SHORT TERM GOAL #1   Title  independent with initial HEP    Baseline  doing ex from HHPT    Status  Achieved        PT Long Term Goals - 04/09/17 0956      PT LONG TERM GOAL #1   Title  decrease pain 50%    Time  8    Period  Weeks    Status  New      PT LONG TERM GOAL #2   Title  walk without device and minimal deviation    Time  8    Period  Weeks    Status  New      PT LONG TERM GOAL #3   Title  increase AROM to 0-115 degrees flexion    Time  8    Period  Weeks    Status  New      PT LONG TERM GOAL #4   Title  decrease swelling 50%     Period  Weeks    Status  New      PT LONG TERM GOAL #5   Title  ascend and descend stairs step over step    Time  8    Period  Weeks    Status  New            Plan - 05/01/17 4128    Clinical Impression Statement  Pt with a little improvement with R knee extension. She does report pain at the end range of MT with both flexion and extension. Cues to straighten R knee with step ups and SL on leg press.     Rehab Potential  Good    PT Frequency  3x / week    PT Duration  8 weeks    PT Treatment/Interventions  ADLs/Self Care Home Management;Cryotherapy;Electrical Stimulation;Gait training;Balance training;Therapeutic exercise;Therapeutic activities;Functional mobility training;Stair training;Patient/family education;Manual techniques;Vasopneumatic Device    PT Next Visit Plan  progress ROM ( esp ext) and strength       Patient will benefit from skilled therapeutic intervention in order to improve the following deficits and impairments:  Abnormal gait, Decreased range of motion, Difficulty walking, Decreased activity tolerance, Pain, Decreased scar mobility, Impaired flexibility, Increased edema, Decreased strength, Decreased mobility  Visit Diagnosis: Stiffness of right knee, not elsewhere classified  Difficulty in walking, not elsewhere classified  Acute pain of right knee  Localized edema     Problem List Patient Active Problem List   Diagnosis Date Noted  . Unilateral primary osteoarthritis, right knee 03/06/2017  . 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 Status post total knee replacement, left 08/29/2016  . Unilateral primary osteoarthritis, left knee 07/17/2016  . Acute pain of right knee 06/28/2016  . Baker's cyst of knee, left - suspected 05/30/2016  .  Diverticulitis of colon 03/17/2016  . Hemorrhoids, internal, with bleeding Gr 2 prolapsed 06/30/2011  . Diverticulitis 06/06/2011  . Rectal bleeding 04/19/2009  . ABDOMINAL PAIN, RIGHT LOWER  QUADRANT 04/19/2009  . HYPERLIPIDEMIA 07/11/2006  . HYPERTENSION, ESSENTIAL NOS 07/11/2006  . GERD 07/11/2006  . HEPATITIS B, HX OF 07/11/2006    Scot Jun, PTA 05/01/2017, 8:44 AM  [address] [place], [place], [address] Phone: [phone]   Fax:  [phone]  Name: solomiya pascale MRN: [id number] Date of Birth: 12-01-1950

## 2017-05-03 ENCOUNTER — Ambulatory Visit: Payer: Medicare Other | Admitting: Physical Therapy

## 2017-05-03 ENCOUNTER — Encounter: Payer: Self-pay | Admitting: Physical Therapy

## 2017-05-03 DIAGNOSIS — M25561 Pain in right knee: Secondary | ICD-10-CM | POA: Diagnosis not present

## 2017-05-03 DIAGNOSIS — M25661 Stiffness of right knee, not elsewhere classified: Secondary | ICD-10-CM | POA: Diagnosis not present

## 2017-05-03 DIAGNOSIS — R262 Difficulty in walking, not elsewhere classified: Secondary | ICD-10-CM

## 2017-05-03 DIAGNOSIS — R600 Localized edema: Secondary | ICD-10-CM

## 2017-05-03 NOTE — Therapy (Signed)
[place], [place], [address] Phone: [phone]   Fax:  [phone]  Physical Therapy Treatment  Patient Details  Name: Julie Dougherty MRN: [id number] Date of Birth: 04-06-1950 Referring Provider: Ninfa Linden   Encounter Date: 05/03/2017  PT End of Session - 05/03/17 0845    Visit Number  7    Date for PT Re-Evaluation  06/09/17    Authorization Type  Medicare    PT Start Time  0803    PT Stop Time  0858    PT Time Calculation (min)  55 min    Activity Tolerance  Patient tolerated treatment well    Behavior During Therapy  [facility] for tasks assessed/performed       Past Medical History:  Diagnosis Date  . Arthritis   . Cataract   . Chronic constipation   . Diverticulitis   . Diverticulosis   . Fatty liver   . GERD (gastroesophageal reflux disease)   . Hemorrhoids, internal, with bleeding Gr 2 prolapsed 06/30/2011   Found on colonoscopy 2011 and anoscopy May 2013   . Hepatitis    "B"    no rx >10 yrs  . Hiatal hernia   . High cholesterol   . Hypertension   . Internal hemorrhoids   . OSA (obstructive sleep apnea)   . Pre-diabetes   . PUD (peptic ulcer disease)   . Rectal ulceration 04/27/2009   Associated reactive/regenerative changes and fibromuscular extensions into lamina propria/Mucosal Prolapse  . Sleep apnea    Waiting on CPAP machine    Past Surgical History:  Procedure Laterality Date  . ABDOMINAL HYSTERECTOMY    . COLONOSCOPY  04/27/09    POLYPOID FRAGMENT OF COLONIC MUCOSA WITH SURFACE  . HEMORRHOID BANDING    . TOTAL KNEE ARTHROPLASTY Left 08/29/2016   Procedure: LEFT TOTAL KNEE ARTHROPLASTY;  Surgeon: Mcarthur Rossetti, MD;  Location: [place];  Service: Orthopedics;  Laterality: Left;  . TOTAL KNEE ARTHROPLASTY Right 03/06/2017  . TOTAL KNEE ARTHROPLASTY Right 03/06/2017   Procedure: RIGHT TOTAL KNEE ARTHROPLASTY;  Surgeon: Mcarthur Rossetti, MD;   Location: [place];  Service: Orthopedics;  Laterality: Right;  . TUBAL LIGATION      There were no vitals filed for this visit.  Subjective Assessment - 05/03/17 0806    Subjective  "Im good"    Patient is accompained by:  Interpreter    Currently in Pain?  Yes    Pain Score  4     Pain Location  Knee    Pain Orientation  Right                       [facility] Adult PT Treatment/Exercise - 05/03/17 0001      Ambulation/Gait   Stairs  Yes    Stairs Assistance  6: Modified independent (Device/Increase time)    Stair Management Technique  One rail Right;Step to pattern    Number of Stairs  24    Height of Stairs  6    Gait Comments  Eccentric load pain and weakness with RLE desents. Pt also demos some weakness with aseceding stairsas well       Knee/Hip Exercises: Aerobic   Elliptical  I5 R3 x4 min     Nustep  L 3 6 min LE only      Knee/Hip Exercises: Machines for Strengthening   Cybex Knee Extension  10lb 2x10; 5lb 2x10  RLE     Cybex Knee Flexion  20# 2 sets 10, 15lb RT only 2x10    Total Gym Leg Press  20lb RLE TKE 3x10       Modalities   Modalities  Vasopneumatic      Vasopneumatic   Number Minutes Vasopneumatic   15 minutes    Vasopnuematic Location   Knee    Vasopneumatic Pressure  Medium    Vasopneumatic Temperature   33      Manual Therapy   Manual Therapy  Passive ROM    Passive ROM  to increase ext               PT Short Term Goals - 04/17/17 7782      PT SHORT TERM GOAL #1   Title  independent with initial HEP    Baseline  doing ex from HHPT    Status  Achieved        PT Long Term Goals - 04/09/17 0956      PT LONG TERM GOAL #1   Title  decrease pain 50%    Time  8    Period  Weeks    Status  New      PT LONG TERM GOAL #2   Title  walk without device and minimal deviation    Time  8    Period  Weeks    Status  New      PT LONG TERM GOAL #3   Title  increase AROM to 0-115 degrees flexion    Time  8    Period  Weeks     Status  New      PT LONG TERM GOAL #4   Title  decrease swelling 50%    Period  Weeks    Status  New      PT LONG TERM GOAL #5   Title  ascend and descend stairs step over step    Time  8    Period  Weeks    Status  New            Plan - 05/03/17 0845    Clinical Impression Statement  Progressed pt to stair negotiation, she demos some weakness with going up and down stairs. All exercises completed well but she does lack full knee extension. Cues to straighten leg on leg press and leg extension.    Rehab Potential  Good    PT Frequency  3x / week    PT Treatment/Interventions  ADLs/Self Care Home Management;Cryotherapy;Electrical Stimulation;Gait training;Balance training;Therapeutic exercise;Therapeutic activities;Functional mobility training;Stair training;Patient/family education;Manual techniques;Vasopneumatic Device    PT Next Visit Plan  progress ROM ( esp ext) and strength       Patient will benefit from skilled therapeutic intervention in order to improve the following deficits and impairments:  Abnormal gait, Decreased range of motion, Difficulty walking, Decreased activity tolerance, Pain, Decreased scar mobility, Impaired flexibility, Increased edema, Decreased strength, Decreased mobility  Visit Diagnosis: No diagnosis found.     Problem List Patient Active Problem List   Diagnosis Date Noted  . Unilateral primary osteoarthritis, right knee 03/06/2017  . 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 Status post total knee replacement, left 08/29/2016  . Unilateral primary osteoarthritis, left knee 07/17/2016  . Acute pain of right knee 06/28/2016  . Baker's cyst of knee, left - suspected 05/30/2016  . Diverticulitis of colon 03/17/2016  . Hemorrhoids, internal, with bleeding Gr 2 prolapsed 06/30/2011  . Diverticulitis 06/06/2011  .  Rectal bleeding 04/19/2009  . ABDOMINAL PAIN, RIGHT LOWER QUADRANT 04/19/2009  . HYPERLIPIDEMIA 07/11/2006  .  HYPERTENSION, ESSENTIAL NOS 07/11/2006  . GERD 07/11/2006  . HEPATITIS B, HX OF 07/11/2006    Scot Jun, PTA 05/03/2017, 8:51 AM  [address] [place], [place], [address] Phone: [phone]   Fax:  [phone]  Name: esma kilts MRN: [id number] Date of Birth: 14-Apr-1950

## 2017-05-07 ENCOUNTER — Ambulatory Visit: Payer: Medicare Other | Admitting: Physical Therapy

## 2017-05-09 ENCOUNTER — Ambulatory Visit: Payer: Medicare Other | Admitting: Physical Therapy

## 2017-05-09 ENCOUNTER — Encounter: Payer: Self-pay | Admitting: Physical Therapy

## 2017-05-09 DIAGNOSIS — R600 Localized edema: Secondary | ICD-10-CM | POA: Diagnosis not present

## 2017-05-09 DIAGNOSIS — M25561 Pain in right knee: Secondary | ICD-10-CM

## 2017-05-09 DIAGNOSIS — M25661 Stiffness of right knee, not elsewhere classified: Secondary | ICD-10-CM | POA: Diagnosis not present

## 2017-05-09 DIAGNOSIS — R262 Difficulty in walking, not elsewhere classified: Secondary | ICD-10-CM

## 2017-05-09 NOTE — Therapy (Signed)
[facility] [place], [place], [address] Phone: [phone]   Fax:  [phone]  Physical Therapy Treatment  Patient Details  Name: Julie Dougherty MRN: [id number] Date of Birth: July 11, 1950 Referring Provider: Ninfa Linden   Encounter Date: 05/09/2017  PT End of Session - 05/09/17 1013    Visit Number  8    Date for PT Re-Evaluation  06/09/17    Authorization Type  Medicare    PT Start Time  0933    PT Stop Time  1024    PT Time Calculation (min)  51 min    Activity Tolerance  Patient tolerated treatment well    Behavior During Therapy  [facility] for tasks assessed/performed       Past Medical History:  Diagnosis Date  . Arthritis   . Cataract   . Chronic constipation   . Diverticulitis   . Diverticulosis   . Fatty liver   . GERD (gastroesophageal reflux disease)   . Hemorrhoids, internal, with bleeding Gr 2 prolapsed 06/30/2011   Found on colonoscopy 2011 and anoscopy May 2013   . Hepatitis    "B"    no rx >10 yrs  . Hiatal hernia   . High cholesterol   . Hypertension   . Internal hemorrhoids   . OSA (obstructive sleep apnea)   . Pre-diabetes   . PUD (peptic ulcer disease)   . Rectal ulceration 04/27/2009   Associated reactive/regenerative changes and fibromuscular extensions into lamina propria/Mucosal Prolapse  . Sleep apnea    Waiting on CPAP machine    Past Surgical History:  Procedure Laterality Date  . ABDOMINAL HYSTERECTOMY    . COLONOSCOPY  04/27/09    POLYPOID FRAGMENT OF COLONIC MUCOSA WITH SURFACE  . HEMORRHOID BANDING    . TOTAL KNEE ARTHROPLASTY Left 08/29/2016   Procedure: LEFT TOTAL KNEE ARTHROPLASTY;  Surgeon: Mcarthur Rossetti, MD;  Location: [place];  Service: Orthopedics;  Laterality: Left;  . TOTAL KNEE ARTHROPLASTY Right 03/06/2017  . TOTAL KNEE ARTHROPLASTY Right 03/06/2017   Procedure: RIGHT TOTAL KNEE ARTHROPLASTY;  Surgeon: Mcarthur Rossetti, MD;   Location: [place];  Service: Orthopedics;  Laterality: Right;  . TUBAL LIGATION      There were no vitals filed for this visit.  Subjective Assessment - 05/09/17 0934    Subjective  "im ok"    Patient is accompained by:  Family member    Currently in Pain?  Yes    Pain Score  3                        [facility] Adult PT Treatment/Exercise - 05/09/17 0001      Knee/Hip Exercises: Aerobic   Recumbent Bike  5 min       Knee/Hip Exercises: Machines for Strengthening   Cybex Knee Extension  10lb RLE eccentrics x10    Cybex Knee Flexion  20# 2 sets 10    Total Gym Leg Press  20lb 2x15, 20lb RLE TKE 3x10       Knee/Hip Exercises: Standing   Heel Raises  Both;2 sets;15 reps;2 seconds    Lateral Step Up  Right;10 reps;Hand Hold: 0;2 sets;Step Height: 6"      Knee/Hip Exercises: Seated   Sit to Sand  2 sets;10 reps holding yellow ball      Modalities   Modalities  Vasopneumatic      Vasopneumatic   Number Minutes Vasopneumatic  15 minutes    Vasopnuematic Location   Knee    Vasopneumatic Pressure  Medium    Vasopneumatic Temperature   33      Manual Therapy   Manual Therapy  Passive ROM    Passive ROM  to increase ext               PT Short Term Goals - 04/17/17 3086      PT SHORT TERM GOAL #1   Title  independent with initial HEP    Baseline  doing ex from HHPT    Status  Achieved        PT Long Term Goals - 04/09/17 0956      PT LONG TERM GOAL #1   Title  decrease pain 50%    Time  8    Period  Weeks    Status  New      PT LONG TERM GOAL #2   Title  walk without device and minimal deviation    Time  8    Period  Weeks    Status  New      PT LONG TERM GOAL #3   Title  increase AROM to 0-115 degrees flexion    Time  8    Period  Weeks    Status  New      PT LONG TERM GOAL #4   Title  decrease swelling 50%    Period  Weeks    Status  New      PT LONG TERM GOAL #5   Title  ascend and descend stairs step over step    Time  8     Period  Weeks    Status  New            Plan - 05/09/17 1015    Clinical Impression Statement  Pain reported at the end range of MT. All exercises completed well with good strength. Pt continues to lack full R knee extension. Some compensation noted with sit to stand.    Rehab Potential  Good    PT Frequency  3x / week    PT Duration  8 weeks    PT Treatment/Interventions  ADLs/Self Care Home Management;Cryotherapy;Electrical Stimulation;Gait training;Balance training;Therapeutic exercise;Therapeutic activities;Functional mobility training;Stair training;Patient/family education;Manual techniques;Vasopneumatic Device    PT Next Visit Plan  progress ROM ( esp ext) and strength       Patient will benefit from skilled therapeutic intervention in order to improve the following deficits and impairments:  Abnormal gait, Decreased range of motion, Difficulty walking, Decreased activity tolerance, Pain, Decreased scar mobility, Impaired flexibility, Increased edema, Decreased strength, Decreased mobility  Visit Diagnosis: Difficulty in walking, not elsewhere classified  Stiffness of right knee, not elsewhere classified  Acute pain of right knee  Localized edema     Problem List Patient Active Problem List   Diagnosis Date Noted  . Unilateral primary osteoarthritis, right knee 03/06/2017  . 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 Status post total knee replacement, left 08/29/2016  . Unilateral primary osteoarthritis, left knee 07/17/2016  . Acute pain of right knee 06/28/2016  . Baker's cyst of knee, left - suspected 05/30/2016  . Diverticulitis of colon 03/17/2016  . Hemorrhoids, internal, with bleeding Gr 2 prolapsed 06/30/2011  . Diverticulitis 06/06/2011  . Rectal bleeding 04/19/2009  . ABDOMINAL PAIN, RIGHT LOWER QUADRANT 04/19/2009  . HYPERLIPIDEMIA 07/11/2006  . HYPERTENSION, ESSENTIAL NOS 07/11/2006  . GERD 07/11/2006  . HEPATITIS B, HX OF  07/11/2006     Scot Jun 05/09/2017, 10:19 AM  [place] [address] [place], [place], [address] Phone: [phone]   Fax:  [phone]  Name: madisin hasan MRN: [id number] Date of Birth: 1950/11/30

## 2017-05-14 ENCOUNTER — Ambulatory Visit: Payer: Medicare Other | Admitting: Physical Therapy

## 2017-05-14 ENCOUNTER — Encounter: Payer: Self-pay | Admitting: Physical Therapy

## 2017-05-14 DIAGNOSIS — R262 Difficulty in walking, not elsewhere classified: Secondary | ICD-10-CM | POA: Diagnosis not present

## 2017-05-14 DIAGNOSIS — R600 Localized edema: Secondary | ICD-10-CM | POA: Diagnosis not present

## 2017-05-14 DIAGNOSIS — M25561 Pain in right knee: Secondary | ICD-10-CM | POA: Diagnosis not present

## 2017-05-14 DIAGNOSIS — M25661 Stiffness of right knee, not elsewhere classified: Secondary | ICD-10-CM | POA: Diagnosis not present

## 2017-05-14 NOTE — Therapy (Signed)
[facility] [place], [place], [address] Phone: [phone]   Fax:  [phone]  Physical Therapy Treatment  Patient Details  Name: Julie Dougherty MRN: [id number] Date of Birth: [birth year] Referring Provider: Ninfa Linden   Encounter Date: 05/14/2017  PT End of Session - 05/14/17 2774    Visit Number  9    Date for PT Re-Evaluation  06/09/17    PT Start Time  1510    PT Stop Time  1608    PT Time Calculation (min)  58 min    Activity Tolerance  Patient tolerated treatment well    Behavior During Therapy  [facility] for tasks assessed/performed       Past Medical History:  Diagnosis Date  . Arthritis   . Cataract   . Chronic constipation   . Diverticulitis   . Diverticulosis   . Fatty liver   . GERD (gastroesophageal reflux disease)   . Hemorrhoids, internal, with bleeding Gr 2 prolapsed 06/30/2011   Found on colonoscopy 2011 and anoscopy May 2013   . Hepatitis    "B"    no rx >10 yrs  . Hiatal hernia   . High cholesterol   . Hypertension   . Internal hemorrhoids   . OSA (obstructive sleep apnea)   . Pre-diabetes   . PUD (peptic ulcer disease)   . Rectal ulceration 04/27/2009   Associated reactive/regenerative changes and fibromuscular extensions into lamina propria/Mucosal Prolapse  . Sleep apnea    Waiting on CPAP machine    Past Surgical History:  Procedure Laterality Date  . ABDOMINAL HYSTERECTOMY    . COLONOSCOPY  04/27/09    POLYPOID FRAGMENT OF COLONIC MUCOSA WITH SURFACE  . HEMORRHOID BANDING    . TOTAL KNEE ARTHROPLASTY Left 08/29/2016   Procedure: LEFT TOTAL KNEE ARTHROPLASTY;  Surgeon: Mcarthur Rossetti, MD;  Location: [place];  Service: Orthopedics;  Laterality: Left;  . TOTAL KNEE ARTHROPLASTY Right 03/06/2017  . TOTAL KNEE ARTHROPLASTY Right 03/06/2017   Procedure: RIGHT TOTAL KNEE ARTHROPLASTY;  Surgeon: Mcarthur Rossetti, MD;  Location: [place];  Service:  Orthopedics;  Laterality: Right;  . TUBAL LIGATION      There were no vitals filed for this visit.  Subjective Assessment - 05/14/17 1509    Subjective  "My knee hurts"    Currently in Pain?  Yes    Pain Score  3     Pain Location  Knee    Pain Orientation  Right                       [facility] Adult PT Treatment/Exercise - 05/14/17 0001      Knee/Hip Exercises: Aerobic   Nustep  L5 6 min      Knee/Hip Exercises: Machines for Strengthening   Cybex Knee Extension  10lb RLE eccentrics x10    Cybex Knee Flexion  20# 2 sets 10    Total Gym Leg Press  20lb 3x10lbs      Knee/Hip Exercises: Standing   Heel Raises  Both;2 sets;10 reps    Terminal Knee Extension  AROM;Strengthening;Right;2 sets;10 reps    Forward Step Up  2 sets;20 reps;Hand Hold: 2      Knee/Hip Exercises: Seated   Sit to Sand  2 sets;10 reps holding red ball      Knee/Hip Exercises: Supine   Terminal Knee Extension  AROM;Strengthening;Right;2 sets;10 reps      Modalities  Modalities  Vasopneumatic      Vasopneumatic   Number Minutes Vasopneumatic   15 minutes    Vasopnuematic Location   Knee    Vasopneumatic Pressure  Medium    Vasopneumatic Temperature   33      Manual Therapy   Manual Therapy  Passive ROM    Passive ROM  to increase ext               PT Short Term Goals - 04/17/17 5732      PT SHORT TERM GOAL #1   Title  independent with initial HEP    Baseline  doing ex from HHPT    Status  Achieved        PT Long Term Goals - 04/09/17 0956      PT LONG TERM GOAL #1   Title  decrease pain 50%    Time  8    Period  Weeks    Status  New      PT LONG TERM GOAL #2   Title  walk without device and minimal deviation    Time  8    Period  Weeks    Status  New      PT LONG TERM GOAL #3   Title  increase AROM to 0-115 degrees flexion    Time  8    Period  Weeks    Status  New      PT LONG TERM GOAL #4   Title  decrease swelling 50%    Period  Weeks    Status   New      PT LONG TERM GOAL #5   Title  ascend and descend stairs step over step    Time  8    Period  Weeks    Status  New            Plan - 05/14/17 1559    Clinical Impression Statement  pt reported pain when stepping down with right LE during step ups and during end range flexion and extension during PROM. Pt lacks full extension in R knee.    PT Frequency  3x / week    PT Duration  8 weeks    PT Treatment/Interventions  ADLs/Self Care Home Management;Cryotherapy;Electrical Stimulation;Gait training;Balance training;Therapeutic exercise;Therapeutic activities;Functional mobility training;Stair training;Patient/family education;Manual techniques;Vasopneumatic Device    PT Next Visit Plan  Continue to strengthen R LE and focus on right knee terminal extension       Patient will benefit from skilled therapeutic intervention in order to improve the following deficits and impairments:     Visit Diagnosis: Acute pain of right knee  Localized edema  Stiffness of right knee, not elsewhere classified  Difficulty in walking, not elsewhere classified     Problem List Patient Active Problem List   Diagnosis Date Noted  . Unilateral primary osteoarthritis, right knee 03/06/2017  . 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 Status post total knee replacement, left 08/29/2016  . Unilateral primary osteoarthritis, left knee 07/17/2016  . Acute pain of right knee 06/28/2016  . Baker's cyst of knee, left - suspected 05/30/2016  . Diverticulitis of colon 03/17/2016  . Hemorrhoids, internal, with bleeding Gr 2 prolapsed 06/30/2011  . Diverticulitis 06/06/2011  . Rectal bleeding 04/19/2009  . ABDOMINAL PAIN, RIGHT LOWER QUADRANT 04/19/2009  . HYPERLIPIDEMIA 07/11/2006  . HYPERTENSION, ESSENTIAL NOS 07/11/2006  . GERD 07/11/2006  . HEPATITIS B, HX OF 07/11/2006    Loyal Gambler 05/14/2017, 4:10 PM  [facility] [address] [place], [place], [address] Phone: [phone]   Fax:  [phone]  Name: Julie Dougherty MRN: [id number] Date of Birth: 21-Dec-1950

## 2017-05-16 ENCOUNTER — Ambulatory Visit: Payer: Medicare Other | Admitting: Physical Therapy

## 2017-05-16 ENCOUNTER — Encounter: Payer: Self-pay | Admitting: Physical Therapy

## 2017-05-16 DIAGNOSIS — M25661 Stiffness of right knee, not elsewhere classified: Secondary | ICD-10-CM | POA: Diagnosis not present

## 2017-05-16 DIAGNOSIS — R262 Difficulty in walking, not elsewhere classified: Secondary | ICD-10-CM

## 2017-05-16 DIAGNOSIS — M25561 Pain in right knee: Secondary | ICD-10-CM

## 2017-05-16 DIAGNOSIS — R600 Localized edema: Secondary | ICD-10-CM

## 2017-05-16 NOTE — Therapy (Signed)
[facility], [place], [address] Phone: [phone]   Fax:  [phone]  Physical Therapy Treatment  Patient Details  Name: Julie Dougherty MRN: [id number] Date of Birth: 1950/11/20 Referring Provider: Ninfa Linden   Encounter Date: 05/16/2017  PT End of Session - 05/16/17 1143    Visit Number  10    Date for PT Re-Evaluation  06/09/17    PT Start Time  1102    PT Stop Time  1152    PT Time Calculation (min)  50 min    Activity Tolerance  Patient tolerated treatment well    Behavior During Therapy  [facility] for tasks assessed/performed       Past Medical History:  Diagnosis Date  . Arthritis   . Cataract   . Chronic constipation   . Diverticulitis   . Diverticulosis   . Fatty liver   . GERD (gastroesophageal reflux disease)   . Hemorrhoids, internal, with bleeding Gr 2 prolapsed 06/30/2011   Found on colonoscopy 2011 and anoscopy May 2013   . Hepatitis    "B"    no rx >10 yrs  . Hiatal hernia   . High cholesterol   . Hypertension   . Internal hemorrhoids   . OSA (obstructive sleep apnea)   . Pre-diabetes   . PUD (peptic ulcer disease)   . Rectal ulceration 04/27/2009   Associated reactive/regenerative changes and fibromuscular extensions into lamina propria/Mucosal Prolapse  . Sleep apnea    Waiting on CPAP machine    Past Surgical History:  Procedure Laterality Date  . ABDOMINAL HYSTERECTOMY    . COLONOSCOPY  04/27/09    POLYPOID FRAGMENT OF COLONIC MUCOSA WITH SURFACE  . HEMORRHOID BANDING    . TOTAL KNEE ARTHROPLASTY Left 08/29/2016   Procedure: LEFT TOTAL KNEE ARTHROPLASTY;  Surgeon: Mcarthur Rossetti, MD;  Location: [place];  Service: Orthopedics;  Laterality: Left;  . TOTAL KNEE ARTHROPLASTY Right 03/06/2017  . TOTAL KNEE ARTHROPLASTY Right 03/06/2017   Procedure: RIGHT TOTAL KNEE ARTHROPLASTY;  Surgeon: Mcarthur Rossetti, MD;  Location: [facility];  Service:  Orthopedics;  Laterality: Right;  . TUBAL LIGATION      There were no vitals filed for this visit.  Subjective Assessment - 05/16/17 1104    Subjective  My knee hurts less today    Pain Score  3     Pain Location  Knee    Pain Orientation  Right         [facility] PT Assessment - 05/16/17 0001      AROM   AROM Assessment Site  Knee    Right/Left Knee  Right    Right Knee Extension  12    Right Knee Flexion  100                   [facility] Adult PT Treatment/Exercise - 05/16/17 0001      Knee/Hip Exercises: Aerobic   Stationary Bike  L1 7 min      Knee/Hip Exercises: Standing   Heel Raises  Both;2 sets;10 reps    Terminal Knee Extension  AROM;Strengthening;Right;2 sets;10 reps with Red TB    Lateral Step Up  2 sets;10 reps    Forward Step Up  2 sets;Right;1 set;10 reps pt needed 2 hands for tap downs, vc to tap food on floor    Functional Squat  2 sets;10 reps VC to keep knee behind toe  Vasopneumatic   Number Minutes Vasopneumatic   15 minutes    Vasopnuematic Location   Knee    Vasopneumatic Pressure  Medium    Vasopneumatic Temperature   33      Manual Therapy   Manual Therapy  Joint mobilization;Passive ROM    Joint Mobilization  patellar mobilization all directions    Passive ROM  to increase ext               PT Short Term Goals - 04/17/17 8299      PT SHORT TERM GOAL #1   Title  independent with initial HEP    Baseline  doing ex from HHPT    Status  Achieved        PT Long Term Goals - 04/09/17 0956      PT LONG TERM GOAL #1   Title  decrease pain 50%    Time  8    Period  Weeks    Status  New      PT LONG TERM GOAL #2   Title  walk without device and minimal deviation    Time  8    Period  Weeks    Status  New      PT LONG TERM GOAL #3   Title  increase AROM to 0-115 degrees flexion    Time  8    Period  Weeks    Status  New      PT LONG TERM GOAL #4   Title  decrease swelling 50%    Period  Weeks    Status  New       PT LONG TERM GOAL #5   Title  ascend and descend stairs step over step    Time  8    Period  Weeks    Status  New            Plan - 05/16/17 1144    Clinical Impression Statement  Pt reported that she will visit the doctor before next visit. Pt has observable edema in right knee, and lacks terminal knee extension. Pt needed some vc and tactile cuing to activate quad on step ups, and vc for proper technique on functional squats.     Rehab Potential  Good    PT Frequency  3x / week    PT Duration  8 weeks    PT Treatment/Interventions  ADLs/Self Care Home Management;Cryotherapy;Electrical Stimulation;Gait training;Balance training;Therapeutic exercise;Therapeutic activities;Functional mobility training;Stair training;Patient/family education;Manual techniques;Vasopneumatic Device    PT Next Visit Plan  Continue to strengthen R LE and focus on right knee terminal extension       Patient will benefit from skilled therapeutic intervention in order to improve the following deficits and impairments:  Abnormal gait, Decreased range of motion, Difficulty walking, Decreased activity tolerance, Pain, Decreased scar mobility, Impaired flexibility, Increased edema, Decreased strength, Decreased mobility  Visit Diagnosis: Acute pain of right knee  Localized edema  Stiffness of right knee, not elsewhere classified  Difficulty in walking, not elsewhere classified     Problem List Patient Active Problem List   Diagnosis Date Noted  . Unilateral primary osteoarthritis, right knee 03/06/2017  . 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 Status post total knee replacement, left 08/29/2016  . Unilateral primary osteoarthritis, left knee 07/17/2016  . Acute pain of right knee 06/28/2016  . Baker's cyst of knee, left - suspected 05/30/2016  . Diverticulitis of colon 03/17/2016  . Hemorrhoids, internal, with bleeding Gr 2  prolapsed 06/30/2011  . Diverticulitis 06/06/2011  .  Rectal bleeding 04/19/2009  . ABDOMINAL PAIN, RIGHT LOWER QUADRANT 04/19/2009  . HYPERLIPIDEMIA 07/11/2006  . HYPERTENSION, ESSENTIAL NOS 07/11/2006  . GERD 07/11/2006  . HEPATITIS B, HX OF 07/11/2006    Loyal Gambler 05/16/2017, 11:56 AM  [place] [address] [place], [place], [address] Phone: [phone]   Fax:  [phone]  Name: jacari kirsten MRN: [id number] Date of Birth: 25-Sep-1950

## 2017-05-17 ENCOUNTER — Encounter (INDEPENDENT_AMBULATORY_CARE_PROVIDER_SITE_OTHER): Payer: Self-pay | Admitting: Orthopaedic Surgery

## 2017-05-17 ENCOUNTER — Ambulatory Visit (INDEPENDENT_AMBULATORY_CARE_PROVIDER_SITE_OTHER): Payer: Medicare Other | Admitting: Orthopaedic Surgery

## 2017-05-17 DIAGNOSIS — Z96651 Presence of right artificial knee joint: Secondary | ICD-10-CM

## 2017-05-17 DIAGNOSIS — Z96652 Presence of left artificial knee joint: Secondary | ICD-10-CM

## 2017-05-17 MED ORDER — TIZANIDINE HCL 4 MG PO TABS: 4.0000 mg | ORAL_TABLET | Freq: Three times a day (TID) | ORAL | 0 refills | Status: DC | PRN

## 2017-05-17 MED ORDER — HYDROCODONE-ACETAMINOPHEN 5-325 MG PO TABS: 1.0000 | ORAL_TABLET | Freq: Four times a day (QID) | ORAL | 0 refills | Status: DC | PRN

## 2017-05-17 NOTE — Progress Notes (Signed)
Patient comes in today now 10 weeks status post a right total knee arthroplasty.  She has 9 months status post her left knee being replaced.  She is doing well overall.  She ambulates using a cane out in public but not when she is in her home.  On exam the left knee has full extension to 110 degrees flexion.  The right more recent operative knee as almost full extension to at least 95 degrees flexion.  Otherwise there is no complicating features in the knees feel ligaments is stable.  This point we will have her continue increase her activities.  We will see her back in 2 months we will have an AP and lateral of both knees at that visit.  All questions concerns were answered and addressed.  I did send in for hydrocodone and Zanaflex for her.

## 2017-05-21 ENCOUNTER — Encounter: Payer: Self-pay | Admitting: Physical Therapy

## 2017-05-21 ENCOUNTER — Ambulatory Visit: Payer: Medicare Other | Admitting: Physical Therapy

## 2017-05-21 ENCOUNTER — Encounter: Payer: Medicare Other | Admitting: Physical Therapy

## 2017-05-21 DIAGNOSIS — M25561 Pain in right knee: Secondary | ICD-10-CM

## 2017-05-21 DIAGNOSIS — R262 Difficulty in walking, not elsewhere classified: Secondary | ICD-10-CM

## 2017-05-21 DIAGNOSIS — M25661 Stiffness of right knee, not elsewhere classified: Secondary | ICD-10-CM | POA: Diagnosis not present

## 2017-05-21 DIAGNOSIS — R600 Localized edema: Secondary | ICD-10-CM

## 2017-05-21 NOTE — Therapy (Signed)
[place], [place], [address] Phone: [phone]   Fax:  [phone]  Physical Therapy Treatment  Patient Details  Name: Julie Dougherty MRN: [id number] Date of Birth: Feb 25, 1950 Referring Provider: Ninfa Linden   Encounter Date: 05/21/2017  PT End of Session - 05/21/17 1225    Visit Number  11    Date for PT Re-Evaluation  06/09/17    Authorization Type  Medicare    PT Start Time  1150    PT Stop Time  1236    PT Time Calculation (min)  46 min    Activity Tolerance  Patient tolerated treatment well    Behavior During Therapy  [facility] for tasks assessed/performed       Past Medical History:  Diagnosis Date  . Arthritis   . Cataract   . Chronic constipation   . Diverticulitis   . Diverticulosis   . Fatty liver   . GERD (gastroesophageal reflux disease)   . Hemorrhoids, internal, with bleeding Gr 2 prolapsed 06/30/2011   Found on colonoscopy 2011 and anoscopy May 2013   . Hepatitis    "B"    no rx >10 yrs  . Hiatal hernia   . High cholesterol   . Hypertension   . Internal hemorrhoids   . OSA (obstructive sleep apnea)   . Pre-diabetes   . PUD (peptic ulcer disease)   . Rectal ulceration 04/27/2009   Associated reactive/regenerative changes and fibromuscular extensions into lamina propria/Mucosal Prolapse  . Sleep apnea    Waiting on CPAP machine    Past Surgical History:  Procedure Laterality Date  . ABDOMINAL HYSTERECTOMY    . COLONOSCOPY  04/27/09    POLYPOID FRAGMENT OF COLONIC MUCOSA WITH SURFACE  . HEMORRHOID BANDING    . TOTAL KNEE ARTHROPLASTY Left 08/29/2016   Procedure: LEFT TOTAL KNEE ARTHROPLASTY;  Surgeon: Mcarthur Rossetti, MD;  Location: [place];  Service: Orthopedics;  Laterality: Left;  . TOTAL KNEE ARTHROPLASTY Right 03/06/2017  . TOTAL KNEE ARTHROPLASTY Right 03/06/2017   Procedure: RIGHT TOTAL KNEE ARTHROPLASTY;  Surgeon: Mcarthur Rossetti, MD;   Location: [place];  Service: Orthopedics;  Laterality: Right;  . TUBAL LIGATION      There were no vitals filed for this visit.  Subjective Assessment - 05/21/17 1150    Subjective  My knee hurts    Patient is accompained by:  Interpreter    Limitations  Standing;Walking;House hold activities    Currently in Pain?  Yes    Pain Score  3     Pain Location  Knee    Pain Orientation  Right    Pain Onset  More than a month ago                       [facility] Adult PT Treatment/Exercise - 05/21/17 0001      Knee/Hip Exercises: Aerobic   Recumbent Bike  4 min    Nustep  L4 6 min      Knee/Hip Exercises: Standing   Terminal Knee Extension  AROM;Strengthening;Right;1 set;20 reps;Theraband    Theraband Level (Terminal Knee Extension)  Level 2 (Red)    Lateral Step Up  2 sets;10 reps    Forward Step Up  Right;2 sets;10 reps    Other Standing Knee Exercises  calf stretch 2x30 sec      Vasopneumatic   Number Minutes Vasopneumatic   15 minutes  Vasopnuematic Location   Knee    Vasopneumatic Pressure  Medium    Vasopneumatic Temperature   33      Manual Therapy   Manual Therapy  Joint mobilization;Passive ROM    Joint Mobilization  patellar mobilization    Passive ROM  to increase ext               PT Short Term Goals - 04/17/17 7096      PT SHORT TERM GOAL #1   Title  independent with initial HEP    Baseline  doing ex from HHPT    Status  Achieved        PT Long Term Goals - 04/09/17 0956      PT LONG TERM GOAL #1   Title  decrease pain 50%    Time  8    Period  Weeks    Status  New      PT LONG TERM GOAL #2   Title  walk without device and minimal deviation    Time  8    Period  Weeks    Status  New      PT LONG TERM GOAL #3   Title  increase AROM to 0-115 degrees flexion    Time  8    Period  Weeks    Status  New      PT LONG TERM GOAL #4   Title  decrease swelling 50%    Period  Weeks    Status  New      PT LONG TERM GOAL #5    Title  ascend and descend stairs step over step    Time  8    Period  Weeks    Status  New            Plan - 05/21/17 1226    Clinical Impression Statement  Pt reported that the doctor's visit went well, and that yesterday she that she cooked a lot yesterday. She had observable edema in right knee that limits her knee extension. She verbally reported tightness along her surgical scar and rubbed her right knee c/o of pain. She required tactile and verbal cuing to rest and activate quad during step ups.     Clinical Presentation  Stable    Clinical Decision Making  Low    Rehab Potential  Good    PT Duration  8 weeks    PT Treatment/Interventions  ADLs/Self Care Home Management;Cryotherapy;Electrical Stimulation;Gait training;Balance training;Therapeutic exercise;Therapeutic activities;Functional mobility training;Stair training;Patient/family education;Manual techniques;Vasopneumatic Device    PT Next Visit Plan  Continue tostrengthen R LE and focus on right knee terminal extension    Consulted and Agree with Plan of Care  Patient       Patient will benefit from skilled therapeutic intervention in order to improve the following deficits and impairments:  Abnormal gait, Decreased range of motion, Difficulty walking, Decreased activity tolerance, Pain, Decreased scar mobility, Impaired flexibility, Increased edema, Decreased strength, Decreased mobility  Visit Diagnosis: Acute pain of right knee  Localized edema  Stiffness of right knee, not elsewhere classified  Difficulty in walking, not elsewhere classified     Problem List Patient Active Problem List   Diagnosis Date Noted  . Unilateral primary osteoarthritis, right knee 03/06/2017  . 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 Status post total knee replacement, left 08/29/2016  . Unilateral primary osteoarthritis, left knee 07/17/2016  . Acute pain of right knee 06/28/2016  . Baker's cyst of knee,  left -  suspected 05/30/2016  . Diverticulitis of colon 03/17/2016  . Hemorrhoids, internal, with bleeding Gr 2 prolapsed 06/30/2011  . Diverticulitis 06/06/2011  . Rectal bleeding 04/19/2009  . ABDOMINAL PAIN, RIGHT LOWER QUADRANT 04/19/2009  . HYPERLIPIDEMIA 07/11/2006  . HYPERTENSION, ESSENTIAL NOS 07/11/2006  . GERD 07/11/2006  . HEPATITIS B, HX OF 07/11/2006    Loyal Gambler 05/21/2017, 12:39 PM  [place] [address] [place], [place], [address] Phone: [phone]   Fax:  [phone]  Name: Julie Dougherty MRN: [id number] Date of Birth: 11/23/1950

## 2017-05-23 ENCOUNTER — Ambulatory Visit: Payer: Medicare Other | Admitting: Physical Therapy

## 2017-05-23 ENCOUNTER — Encounter: Payer: Medicare Other | Admitting: Physical Therapy

## 2017-05-23 ENCOUNTER — Encounter: Payer: Self-pay | Admitting: Physical Therapy

## 2017-05-23 DIAGNOSIS — M25561 Pain in right knee: Secondary | ICD-10-CM | POA: Diagnosis not present

## 2017-05-23 DIAGNOSIS — R600 Localized edema: Secondary | ICD-10-CM | POA: Diagnosis not present

## 2017-05-23 DIAGNOSIS — R262 Difficulty in walking, not elsewhere classified: Secondary | ICD-10-CM | POA: Diagnosis not present

## 2017-05-23 DIAGNOSIS — M25661 Stiffness of right knee, not elsewhere classified: Secondary | ICD-10-CM | POA: Diagnosis not present

## 2017-05-23 NOTE — Therapy (Signed)
[place], [place], [address] Phone: [phone]   Fax:  [phone]  Physical Therapy Treatment  Patient Details  Name: Julie Dougherty MRN: [id number] Date of Birth: March 28, 1950 Referring Provider: Ninfa Linden   Encounter Date: 05/23/2017  PT End of Session - 05/23/17 1227    Visit Number  12    Date for PT Re-Evaluation  06/09/17    Authorization Type  Medicare    PT Start Time  1150    PT Stop Time  1236    PT Time Calculation (min)  46 min    Activity Tolerance  Patient tolerated treatment well    Behavior During Therapy  [facility] for tasks assessed/performed       Past Medical History:  Diagnosis Date  . Arthritis   . Cataract   . Chronic constipation   . Diverticulitis   . Diverticulosis   . Fatty liver   . GERD (gastroesophageal reflux disease)   . Hemorrhoids, internal, with bleeding Gr 2 prolapsed 06/30/2011   Found on colonoscopy 2011 and anoscopy May 2013   . Hepatitis    "B"    no rx >10 yrs  . Hiatal hernia   . High cholesterol   . Hypertension   . Internal hemorrhoids   . OSA (obstructive sleep apnea)   . Pre-diabetes   . PUD (peptic ulcer disease)   . Rectal ulceration 04/27/2009   Associated reactive/regenerative changes and fibromuscular extensions into lamina propria/Mucosal Prolapse  . Sleep apnea    Waiting on CPAP machine    Past Surgical History:  Procedure Laterality Date  . ABDOMINAL HYSTERECTOMY    . COLONOSCOPY  04/27/09    POLYPOID FRAGMENT OF COLONIC MUCOSA WITH SURFACE  . HEMORRHOID BANDING    . TOTAL KNEE ARTHROPLASTY Left 08/29/2016   Procedure: LEFT TOTAL KNEE ARTHROPLASTY;  Surgeon: Mcarthur Rossetti, MD;  Location: [place];  Service: Orthopedics;  Laterality: Left;  . TOTAL KNEE ARTHROPLASTY Right 03/06/2017  . TOTAL KNEE ARTHROPLASTY Right 03/06/2017   Procedure: RIGHT TOTAL KNEE ARTHROPLASTY;  Surgeon: Mcarthur Rossetti, MD;   Location: [place];  Service: Orthopedics;  Laterality: Right;  . TUBAL LIGATION      There were no vitals filed for this visit.  Subjective Assessment - 05/23/17 1151    Subjective  "i'm doing good"    Patient is accompained by:  Family member    Currently in Pain?  Yes    Pain Score  3     Pain Location  Knee    Pain Orientation  Right    Pain Onset  More than a month ago         [facility] PT Assessment - 05/23/17 0001      AROM   AROM Assessment Site  Knee    Right/Left Knee  Right    Right Knee Extension  8    Right Knee Flexion  105                   [facility] Adult PT Treatment/Exercise - 05/23/17 0001      Knee/Hip Exercises: Aerobic   Recumbent Bike  4 min fwd/Reverse    Nustep  L5 6 min      Knee/Hip Exercises: Standing   Terminal Knee Extension  AROM;Right;2 sets;10 reps with green ball    Forward Step Up  Right;2 sets;10 reps;Hand Hold: 2;Step Height: 6" pt vc to activate quad  Knee/Hip Exercises: Supine   Terminal Knee Extension  AROM;Strengthening;Right;1 set;15 reps;Theraband    Theraband Level (Terminal Knee Extension)  Level 2 (Red)      Vasopneumatic   Number Minutes Vasopneumatic   15 minutes    Vasopnuematic Location   Knee    Vasopneumatic Pressure  Medium    Vasopneumatic Temperature   33      Manual Therapy   Manual Therapy  Joint mobilization;Passive ROM    Joint Mobilization  patellar mobilization all directions     Passive ROM  to increase ext               PT Short Term Goals - 04/17/17 0160      PT SHORT TERM GOAL #1   Title  independent with initial HEP    Baseline  doing ex from HHPT    Status  Achieved        PT Long Term Goals - 04/09/17 0956      PT LONG TERM GOAL #1   Title  decrease pain 50%    Time  8    Period  Weeks    Status  New      PT LONG TERM GOAL #2   Title  walk without device and minimal deviation    Time  8    Period  Weeks    Status  New      PT LONG TERM GOAL #3   Title   increase AROM to 0-115 degrees flexion    Time  8    Period  Weeks    Status  New      PT LONG TERM GOAL #4   Title  decrease swelling 50%    Period  Weeks    Status  New      PT LONG TERM GOAL #5   Title  ascend and descend stairs step over step    Time  8    Period  Weeks    Status  New            Plan - 05/23/17 1227    Clinical Impression Statement  Pt reported that her swelling is less today,she initially had difficulty going in reverse on the recumbant bike however she was able to go reverse. She is still limited in extension and reports that she stil has tightness.     PT Duration  8 weeks    PT Treatment/Interventions  ADLs/Self Care Home Management;Cryotherapy;Electrical Stimulation;Gait training;Balance training;Therapeutic exercise;Therapeutic activities;Functional mobility training;Stair training;Patient/family education;Manual techniques;Vasopneumatic Device    PT Next Visit Plan  Focus on right terminal extension, and scar mobilization       Patient will benefit from skilled therapeutic intervention in order to improve the following deficits and impairments:  Abnormal gait, Decreased range of motion, Difficulty walking, Decreased activity tolerance, Pain, Decreased scar mobility, Impaired flexibility, Increased edema, Decreased strength, Decreased mobility  Visit Diagnosis: Acute pain of right knee  Localized edema  Stiffness of right knee, not elsewhere classified  Difficulty in walking, not elsewhere classified     Problem List Patient Active Problem List   Diagnosis Date Noted  . Unilateral primary osteoarthritis, right knee 03/06/2017  . 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 Status post total knee replacement, left 08/29/2016  . Unilateral primary osteoarthritis, left knee 07/17/2016  . Acute pain of right knee 06/28/2016  . Baker's cyst of knee, left - suspected 05/30/2016  . Diverticulitis of colon 03/17/2016  . Hemorrhoids,  internal, with bleeding Gr 2 prolapsed 06/30/2011  . Diverticulitis 06/06/2011  . Rectal bleeding 04/19/2009  . ABDOMINAL PAIN, RIGHT LOWER QUADRANT 04/19/2009  . HYPERLIPIDEMIA 07/11/2006  . HYPERTENSION, ESSENTIAL NOS 07/11/2006  . GERD 07/11/2006  . HEPATITIS B, HX OF 07/11/2006    Loyal Gambler 05/23/2017, 12:38 PM  [place] [address] [place], [place], [address] Phone: [phone]   Fax:  [phone]  Name: allesandra huebsch MRN: [id number] Date of Birth: May 12, 1950

## 2017-05-29 ENCOUNTER — Encounter: Payer: Self-pay | Admitting: Physical Therapy

## 2017-05-29 ENCOUNTER — Ambulatory Visit: Payer: Medicare Other | Admitting: Physical Therapy

## 2017-05-29 DIAGNOSIS — M25661 Stiffness of right knee, not elsewhere classified: Secondary | ICD-10-CM | POA: Diagnosis not present

## 2017-05-29 DIAGNOSIS — R262 Difficulty in walking, not elsewhere classified: Secondary | ICD-10-CM

## 2017-05-29 DIAGNOSIS — R600 Localized edema: Secondary | ICD-10-CM | POA: Diagnosis not present

## 2017-05-29 DIAGNOSIS — M25561 Pain in right knee: Secondary | ICD-10-CM

## 2017-05-29 NOTE — Therapy (Signed)
[place], [place], [address] Phone: [phone]   Fax:  [phone]  Physical Therapy Treatment  Patient Details  Name: Julie Dougherty MRN: [id number] Date of Birth: [birth year] Referring Provider: Ninfa Linden   Encounter Date: 05/29/2017  PT End of Session - 05/29/17 0926    Visit Number  13    Date for PT Re-Evaluation  06/09/17    Authorization Type  Medicare    PT Start Time  0850    PT Stop Time  0935    PT Time Calculation (min)  45 min    Activity Tolerance  Patient tolerated treatment well    Behavior During Therapy  [facility] for tasks assessed/performed       Past Medical History:  Diagnosis Date  . Arthritis   . Cataract   . Chronic constipation   . Diverticulitis   . Diverticulosis   . Fatty liver   . GERD (gastroesophageal reflux disease)   . Hemorrhoids, internal, with bleeding Gr 2 prolapsed 06/30/2011   Found on colonoscopy 2011 and anoscopy May 2013   . Hepatitis    "B"    no rx >10 yrs  . Hiatal hernia   . High cholesterol   . Hypertension   . Internal hemorrhoids   . OSA (obstructive sleep apnea)   . Pre-diabetes   . PUD (peptic ulcer disease)   . Rectal ulceration 04/27/2009   Associated reactive/regenerative changes and fibromuscular extensions into lamina propria/Mucosal Prolapse  . Sleep apnea    Waiting on CPAP machine    Past Surgical History:  Procedure Laterality Date  . ABDOMINAL HYSTERECTOMY    . COLONOSCOPY  04/27/09    POLYPOID FRAGMENT OF COLONIC MUCOSA WITH SURFACE  . HEMORRHOID BANDING    . TOTAL KNEE ARTHROPLASTY Left 08/29/2016   Procedure: LEFT TOTAL KNEE ARTHROPLASTY;  Surgeon: Mcarthur Rossetti, MD;  Location: [place];  Service: Orthopedics;  Laterality: Left;  . TOTAL KNEE ARTHROPLASTY Right 03/06/2017  . TOTAL KNEE ARTHROPLASTY Right 03/06/2017   Procedure: RIGHT TOTAL KNEE ARTHROPLASTY;  Surgeon: Mcarthur Rossetti, MD;   Location: [place];  Service: Orthopedics;  Laterality: Right;  . TUBAL LIGATION      There were no vitals filed for this visit.  Subjective Assessment - 05/29/17 0851    Subjective  "my knee hurts a little bit"    Currently in Pain?  Yes    Pain Score  3     Pain Location  Knee    Pain Orientation  Right                       [facility] Adult PT Treatment/Exercise - 05/29/17 0001      Knee/Hip Exercises: Aerobic   Recumbent Bike  L0 5 min      Knee/Hip Exercises: Machines for Strengthening   Cybex Knee Flexion  20# 1 sets 10, 15# 1x10    Cybex Leg Press  20lb 2x10  emphasizing TKE      Knee/Hip Exercises: Standing   Terminal Knee Extension  AROM;Right;20 reps standing with ball against the wall    Stairs  2x pt had r hip circumduction ascending and descending stairs    Gait Training  50 ft Hallway walking. pt lacks terminal knee extension during gait.         Vasopneumatic   Number Minutes Vasopneumatic   15 minutes    Vasopnuematic Location  Knee    Vasopneumatic Pressure  Medium    Vasopneumatic Temperature   33      Manual Therapy   Manual Therapy  Joint mobilization;Passive ROM    Joint Mobilization  patellar mobilization all directions     Passive ROM  to increase ext               PT Short Term Goals - 04/17/17 3220      PT SHORT TERM GOAL #1   Title  independent with initial HEP    Baseline  doing ex from HHPT    Status  Achieved        PT Long Term Goals - 04/09/17 0956      PT LONG TERM GOAL #1   Title  decrease pain 50%    Time  8    Period  Weeks    Status  New      PT LONG TERM GOAL #2   Title  walk without device and minimal deviation    Time  8    Period  Weeks    Status  New      PT LONG TERM GOAL #3   Title  increase AROM to 0-115 degrees flexion    Time  8    Period  Weeks    Status  New      PT LONG TERM GOAL #4   Title  decrease swelling 50%    Period  Weeks    Status  New      PT LONG TERM GOAL #5    Title  ascend and descend stairs step over step    Time  8    Period  Weeks    Status  New            Plan - 05/29/17 2542    Clinical Impression Statement  Pt reports that she feels like her knee is getting better faster because she's walking better. Progressed to stairs, where pt circumducts right hip to clear step. During Gait in the hallway pt had decrease stance time on right LE. She lacks terminal extension and had decreased knee flexion during Hamstring culrls on the machine.        Patient will benefit from skilled therapeutic intervention in order to improve the following deficits and impairments:  Abnormal gait, Decreased range of motion, Difficulty walking, Decreased activity tolerance, Pain, Decreased scar mobility, Impaired flexibility, Increased edema, Decreased strength, Decreased mobility  Visit Diagnosis: Acute pain of right knee  Localized edema  Stiffness of right knee, not elsewhere classified  Difficulty in walking, not elsewhere classified     Problem List Patient Active Problem List   Diagnosis Date Noted  . Unilateral primary osteoarthritis, right knee 03/06/2017  . 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 Status post total knee replacement, left 08/29/2016  . Unilateral primary osteoarthritis, left knee 07/17/2016  . Acute pain of right knee 06/28/2016  . Baker's cyst of knee, left - suspected 05/30/2016  . Diverticulitis of colon 03/17/2016  . Hemorrhoids, internal, with bleeding Gr 2 prolapsed 06/30/2011  . Diverticulitis 06/06/2011  . Rectal bleeding 04/19/2009  . ABDOMINAL PAIN, RIGHT LOWER QUADRANT 04/19/2009  . HYPERLIPIDEMIA 07/11/2006  . HYPERTENSION, ESSENTIAL NOS 07/11/2006  . GERD 07/11/2006  . HEPATITIS B, HX OF 07/11/2006    Vito Berger 05/29/2017, 9:39 AM  [place] [address] [place], [place], [address] Phone: [phone]  Fax:   [phone]  Name: Julie Dougherty MRN: [id number] Date of Birth: September 29, 1950

## 2017-05-31 ENCOUNTER — Ambulatory Visit: Payer: Medicare Other | Attending: Orthopaedic Surgery | Admitting: Physical Therapy

## 2017-05-31 ENCOUNTER — Encounter: Payer: Self-pay | Admitting: Physical Therapy

## 2017-05-31 DIAGNOSIS — R600 Localized edema: Secondary | ICD-10-CM | POA: Insufficient documentation

## 2017-05-31 DIAGNOSIS — R262 Difficulty in walking, not elsewhere classified: Secondary | ICD-10-CM | POA: Diagnosis not present

## 2017-05-31 DIAGNOSIS — M25561 Pain in right knee: Secondary | ICD-10-CM | POA: Insufficient documentation

## 2017-05-31 DIAGNOSIS — M25661 Stiffness of right knee, not elsewhere classified: Secondary | ICD-10-CM | POA: Diagnosis not present

## 2017-05-31 NOTE — Therapy (Signed)
[place], [place], [address] Phone: [phone]   Fax:  [phone]  Physical Therapy Treatment  Patient Details  Name: Julie Dougherty MRN: [id number] Date of Birth: [birth year] Referring Provider: Ninfa Linden   Encounter Date: 05/31/2017  PT End of Session - 05/31/17 1058    Visit Number  14    Date for PT Re-Evaluation  06/09/17    Authorization Type  Medicare    PT Start Time  1023    PT Stop Time  1110    PT Time Calculation (min)  47 min    Activity Tolerance  Patient tolerated treatment well    Behavior During Therapy  [facility] for tasks assessed/performed       Past Medical History:  Diagnosis Date  . Arthritis   . Cataract   . Chronic constipation   . Diverticulitis   . Diverticulosis   . Fatty liver   . GERD (gastroesophageal reflux disease)   . Hemorrhoids, internal, with bleeding Gr 2 prolapsed 06/30/2011   Found on colonoscopy 2011 and anoscopy May 2013   . Hepatitis    "B"    no rx >10 yrs  . Hiatal hernia   . High cholesterol   . Hypertension   . Internal hemorrhoids   . OSA (obstructive sleep apnea)   . Pre-diabetes   . PUD (peptic ulcer disease)   . Rectal ulceration 04/27/2009   Associated reactive/regenerative changes and fibromuscular extensions into lamina propria/Mucosal Prolapse  . Sleep apnea    Waiting on CPAP machine    Past Surgical History:  Procedure Laterality Date  . ABDOMINAL HYSTERECTOMY    . COLONOSCOPY  04/27/09    POLYPOID FRAGMENT OF COLONIC MUCOSA WITH SURFACE  . HEMORRHOID BANDING    . TOTAL KNEE ARTHROPLASTY Left 08/29/2016   Procedure: LEFT TOTAL KNEE ARTHROPLASTY;  Surgeon: Mcarthur Rossetti, MD;  Location: [place];  Service: Orthopedics;  Laterality: Left;  . TOTAL KNEE ARTHROPLASTY Right 03/06/2017  . TOTAL KNEE ARTHROPLASTY Right 03/06/2017   Procedure: RIGHT TOTAL KNEE ARTHROPLASTY;  Surgeon: Mcarthur Rossetti, MD;   Location: [place];  Service: Orthopedics;  Laterality: Right;  . TUBAL LIGATION      There were no vitals filed for this visit.  Subjective Assessment - 05/31/17 1025    Subjective  "my knee hurts a little bit but not that much    Patient is accompained by:  Family member    Currently in Pain?  Yes    Pain Score  2     Pain Location  Knee    Pain Orientation  Right                       [facility] Adult PT Treatment/Exercise - 05/31/17 0001      Knee/Hip Exercises: Aerobic   Recumbent Bike  5 min fwd, 2 min reverse      Knee/Hip Exercises: Machines for Strengthening   Cybex Leg Press  20lb 3x10       Knee/Hip Exercises: Standing   Heel Raises  Both;2 sets;10 reps    Terminal Knee Extension  AROM;Right;20 reps pt c/o of pain     Lateral Step Up  2 sets;10 reps;Hand Hold: 1;Right    Forward Step Up  2 sets;10 reps;Right    Other Standing Knee Exercises  calf stretch 2x30 sec      Vasopneumatic   Number Minutes Vasopneumatic  15 minutes    Vasopnuematic Location   Knee    Vasopneumatic Pressure  Medium    Vasopneumatic Temperature   33               PT Short Term Goals - 04/17/17 7989      PT SHORT TERM GOAL #1   Title  independent with initial HEP    Baseline  doing ex from HHPT    Status  Achieved        PT Long Term Goals - 04/09/17 0956      PT LONG TERM GOAL #1   Title  decrease pain 50%    Time  8    Period  Weeks    Status  New      PT LONG TERM GOAL #2   Title  walk without device and minimal deviation    Time  8    Period  Weeks    Status  New      PT LONG TERM GOAL #3   Title  increase AROM to 0-115 degrees flexion    Time  8    Period  Weeks    Status  New      PT LONG TERM GOAL #4   Title  decrease swelling 50%    Period  Weeks    Status  New      PT LONG TERM GOAL #5   Title  ascend and descend stairs step over step    Time  8    Period  Weeks    Status  New            Plan - 05/31/17 1059    Clinical  Impression Statement  Patient arrived 7 minutes late. She verbally reported that fter her knee gets better she wishes to go to the [place] She lacks TKE and needs to vc during step ups to activate quad. She verbally reported that her knee hurt during TKE with a ball behind her knee.    Rehab Potential  Good    PT Frequency  3x / week    PT Duration  8 weeks    PT Treatment/Interventions  ADLs/Self Care Home Management;Cryotherapy;Electrical Stimulation;Gait training;Balance training;Therapeutic exercise;Therapeutic activities;Functional mobility training;Stair training;Patient/family education;Manual techniques;Vasopneumatic Device    PT Next Visit Plan  Continue R LE strengthening and R knee TKE       Patient will benefit from skilled therapeutic intervention in order to improve the following deficits and impairments:  Abnormal gait, Decreased range of motion, Difficulty walking, Decreased activity tolerance, Pain, Decreased scar mobility, Impaired flexibility, Increased edema, Decreased strength, Decreased mobility  Visit Diagnosis: Acute pain of right knee  Localized edema  Stiffness of right knee, not elsewhere classified  Difficulty in walking, not elsewhere classified     Problem List Patient Active Problem List   Diagnosis Date Noted  . Unilateral primary osteoarthritis, right knee 03/06/2017  . 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 Status post total knee replacement, left 08/29/2016  . Unilateral primary osteoarthritis, left knee 07/17/2016  . Acute pain of right knee 06/28/2016  . Baker's cyst of knee, left - suspected 05/30/2016  . Diverticulitis of colon 03/17/2016  . Hemorrhoids, internal, with bleeding Gr 2 prolapsed 06/30/2011  . Diverticulitis 06/06/2011  . Rectal bleeding 04/19/2009  . ABDOMINAL PAIN, RIGHT LOWER QUADRANT 04/19/2009  . HYPERLIPIDEMIA 07/11/2006  . HYPERTENSION, ESSENTIAL NOS 07/11/2006  . GERD 07/11/2006  .  HEPATITIS B, HX OF 07/11/2006  Loyal Gambler 05/31/2017, 12:00 PM  [place] [place], [place], [address] Phone: [phone]   Fax:  [phone]  Name: Julie Dougherty MRN: [id number] Date of Birth: [birth year]

## 2017-06-04 ENCOUNTER — Ambulatory Visit: Payer: Medicare Other | Admitting: Physical Therapy

## 2017-06-04 ENCOUNTER — Encounter: Payer: Self-pay | Admitting: Physical Therapy

## 2017-06-04 DIAGNOSIS — R600 Localized edema: Secondary | ICD-10-CM | POA: Diagnosis not present

## 2017-06-04 DIAGNOSIS — R262 Difficulty in walking, not elsewhere classified: Secondary | ICD-10-CM | POA: Diagnosis not present

## 2017-06-04 DIAGNOSIS — M25661 Stiffness of right knee, not elsewhere classified: Secondary | ICD-10-CM | POA: Diagnosis not present

## 2017-06-04 DIAGNOSIS — M25561 Pain in right knee: Secondary | ICD-10-CM | POA: Diagnosis not present

## 2017-06-04 NOTE — Therapy (Signed)
[place], [place], [address] Phone: [phone]   Fax:  [phone]  Physical Therapy Treatment  Patient Details  Name: Julie Dougherty MRN: [id number] Date of Birth: 05/01/1950 Referring Provider: Ninfa Linden   Encounter Date: 06/04/2017  PT End of Session - 06/04/17 1106    Visit Number  15    Date for PT Re-Evaluation  06/09/17    Authorization Type  Medicare    PT Start Time  1018    PT Stop Time  1119    PT Time Calculation (min)  61 min    Behavior During Therapy  [facility] for tasks assessed/performed       Past Medical History:  Diagnosis Date  . Arthritis   . Cataract   . Chronic constipation   . Diverticulitis   . Diverticulosis   . Fatty liver   . GERD (gastroesophageal reflux disease)   . Hemorrhoids, internal, with bleeding Gr 2 prolapsed 06/30/2011   Found on colonoscopy 2011 and anoscopy May 2013   . Hepatitis    "B"    no rx >10 yrs  . Hiatal hernia   . High cholesterol   . Hypertension   . Internal hemorrhoids   . OSA (obstructive sleep apnea)   . Pre-diabetes   . PUD (peptic ulcer disease)   . Rectal ulceration 04/27/2009   Associated reactive/regenerative changes and fibromuscular extensions into lamina propria/Mucosal Prolapse  . Sleep apnea    Waiting on CPAP machine    Past Surgical History:  Procedure Laterality Date  . ABDOMINAL HYSTERECTOMY    . COLONOSCOPY  04/27/09    POLYPOID FRAGMENT OF COLONIC MUCOSA WITH SURFACE  . HEMORRHOID BANDING    . TOTAL KNEE ARTHROPLASTY Left 08/29/2016   Procedure: LEFT TOTAL KNEE ARTHROPLASTY;  Surgeon: Mcarthur Rossetti, MD;  Location: [place];  Service: Orthopedics;  Laterality: Left;  . TOTAL KNEE ARTHROPLASTY Right 03/06/2017  . TOTAL KNEE ARTHROPLASTY Right 03/06/2017   Procedure: RIGHT TOTAL KNEE ARTHROPLASTY;  Surgeon: Mcarthur Rossetti, MD;  Location: [place];  Service: Orthopedics;  Laterality: Right;   . TUBAL LIGATION      There were no vitals filed for this visit.  Subjective Assessment - 06/04/17 1020    Subjective  "Good, a little pain"    Patient is accompained by:  Family member    Currently in Pain?  Yes    Pain Score  2     Pain Location  Knee    Pain Orientation  Right                       [facility] Adult PT Treatment/Exercise - 06/04/17 0001      Knee/Hip Exercises: Aerobic   Elliptical  I5 R3 x 14mn     Recumbent Bike  L1 x3 min fwd     Nustep  L3 5 min, LE only      Knee/Hip Exercises: Machines for Strengthening   Cybex Knee Flexion  25lb 3x15     Cybex Leg Press  30lb 2x15, RLE 20lb 2x10       Knee/Hip Exercises: Seated   Long Arc Quad  AROM;Right;1 set;10 reps      Vasopneumatic   Number Minutes Vasopneumatic   15 minutes    Vasopnuematic Location   Knee    Vasopneumatic Pressure  Medium    Vasopneumatic Temperature   33  Manual Therapy   Passive ROM  flex and ext                PT Short Term Goals - 04/17/17 7510      PT SHORT TERM GOAL #1   Title  independent with initial HEP    Baseline  doing ex from HHPT    Status  Achieved        PT Long Term Goals - 06/04/17 1110      PT LONG TERM GOAL #1   Title  decrease pain 50%    Status  Achieved      PT LONG TERM GOAL #2   Title  walk without device and minimal deviation    Status  Partially Met      PT LONG TERM GOAL #4   Title  decrease swelling 50%    Status  On-going      PT LONG TERM GOAL #5   Title  ascend and descend stairs step over step    Status  Partially Met            Plan - 06/04/17 1107    Clinical Impression Statement  Pt reports little pain overall, but lack full TKE. Ballotable patella noted due to R knee swelling. Does well with isolation exercises but fatigues quick. Cues throughout treatment to fully extend R leg with exercises.    Rehab Potential  Good    PT Frequency  3x / week    PT Duration  8 weeks    PT  Treatment/Interventions  ADLs/Self Care Home Management;Cryotherapy;Electrical Stimulation;Gait training;Balance training;Therapeutic exercise;Therapeutic activities;Functional mobility training;Stair training;Patient/family education;Manual techniques;Vasopneumatic Device    PT Next Visit Plan  Continue R LE strengthening and R knee TKE       Patient will benefit from skilled therapeutic intervention in order to improve the following deficits and impairments:  Abnormal gait, Decreased range of motion, Difficulty walking, Decreased activity tolerance, Pain, Decreased scar mobility, Impaired flexibility, Increased edema, Decreased strength, Decreased mobility  Visit Diagnosis: Acute pain of right knee  Localized edema  Stiffness of right knee, not elsewhere classified  Difficulty in walking, not elsewhere classified     Problem List Patient Active Problem List   Diagnosis Date Noted  . Unilateral primary osteoarthritis, right knee 03/06/2017  . 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 Status post total knee replacement, left 08/29/2016  . Unilateral primary osteoarthritis, left knee 07/17/2016  . Acute pain of right knee 06/28/2016  . Baker's cyst of knee, left - suspected 05/30/2016  . Diverticulitis of colon 03/17/2016  . Hemorrhoids, internal, with bleeding Gr 2 prolapsed 06/30/2011  . Diverticulitis 06/06/2011  . Rectal bleeding 04/19/2009  . ABDOMINAL PAIN, RIGHT LOWER QUADRANT 04/19/2009  . HYPERLIPIDEMIA 07/11/2006  . HYPERTENSION, ESSENTIAL NOS 07/11/2006  . GERD 07/11/2006  . HEPATITIS B, HX OF 07/11/2006    Scot Jun, PTA 06/04/2017, 11:10 AM  [place], [place], [address] Phone: [phone]   Fax:  [phone]  Name: Julie Dougherty MRN: [id number] Date of Birth: 01-16-1951

## 2017-06-06 ENCOUNTER — Encounter: Payer: Self-pay | Admitting: Physical Therapy

## 2017-06-06 ENCOUNTER — Ambulatory Visit: Payer: Medicare Other | Admitting: Physical Therapy

## 2017-06-06 DIAGNOSIS — M25661 Stiffness of right knee, not elsewhere classified: Secondary | ICD-10-CM

## 2017-06-06 DIAGNOSIS — M25561 Pain in right knee: Secondary | ICD-10-CM | POA: Diagnosis not present

## 2017-06-06 DIAGNOSIS — R600 Localized edema: Secondary | ICD-10-CM

## 2017-06-06 DIAGNOSIS — R262 Difficulty in walking, not elsewhere classified: Secondary | ICD-10-CM | POA: Diagnosis not present

## 2017-06-06 NOTE — Therapy (Signed)
[place], [place], [address] Phone: [phone]   Fax:  [phone]  Physical Therapy Treatment  Patient Details  Name: Julie Dougherty MRN: [id number] Date of Birth: [birth year] Referring Provider: Ninfa Linden   Encounter Date: 06/06/2017  PT End of Session - 06/06/17 1016    Visit Number  16    Date for PT Re-Evaluation  06/09/17    Authorization Type  Medicare    PT Start Time  0935    PT Stop Time  1031    PT Time Calculation (min)  56 min    Activity Tolerance  Patient tolerated treatment well    Behavior During Therapy  [facility] for tasks assessed/performed       Past Medical History:  Diagnosis Date  . Arthritis   . Cataract   . Chronic constipation   . Diverticulitis   . Diverticulosis   . Fatty liver   . GERD (gastroesophageal reflux disease)   . Hemorrhoids, internal, with bleeding Gr 2 prolapsed 06/30/2011   Found on colonoscopy 2011 and anoscopy May 2013   . Hepatitis    "B"    no rx >10 yrs  . Hiatal hernia   . High cholesterol   . Hypertension   . Internal hemorrhoids   . OSA (obstructive sleep apnea)   . Pre-diabetes   . PUD (peptic ulcer disease)   . Rectal ulceration 04/27/2009   Associated reactive/regenerative changes and fibromuscular extensions into lamina propria/Mucosal Prolapse  . Sleep apnea    Waiting on CPAP machine    Past Surgical History:  Procedure Laterality Date  . ABDOMINAL HYSTERECTOMY    . COLONOSCOPY  04/27/09    POLYPOID FRAGMENT OF COLONIC MUCOSA WITH SURFACE  . HEMORRHOID BANDING    . TOTAL KNEE ARTHROPLASTY Left 08/29/2016   Procedure: LEFT TOTAL KNEE ARTHROPLASTY;  Surgeon: Mcarthur Rossetti, MD;  Location: [place];  Service: Orthopedics;  Laterality: Left;  . TOTAL KNEE ARTHROPLASTY Right 03/06/2017  . TOTAL KNEE ARTHROPLASTY Right 03/06/2017   Procedure: RIGHT TOTAL KNEE ARTHROPLASTY;  Surgeon: Mcarthur Rossetti, MD;   Location: [place];  Service: Orthopedics;  Laterality: Right;  . TUBAL LIGATION      There were no vitals filed for this visit.  Subjective Assessment - 06/06/17 0940    Subjective  "Good"    Patient is accompained by:  Family member    Currently in Pain?  Yes    Pain Score  2     Pain Location  Knee    Pain Orientation  Right                       [facility] Adult PT Treatment/Exercise - 06/06/17 0001      Ambulation/Gait   Stairs  Yes    Stairs Assistance  6: Modified independent (Device/Increase time)    Stair Management Technique  One rail Right;Alternating pattern    Number of Stairs  48    Height of Stairs  6      Knee/Hip Exercises: Aerobic   Elliptical  I7 R3 x 15mn     Recumbent Bike  L1 x 5 min       Knee/Hip Exercises: Machines for Strengthening   Cybex Knee Extension  RLE 5lb 2x10    Cybex Leg Press  RLE 30lb 2x10       Vasopneumatic   Number Minutes Vasopneumatic   15 minutes  Vasopnuematic Location   Knee    Vasopneumatic Pressure  Medium    Vasopneumatic Temperature   33      Manual Therapy   Manual Therapy  Joint mobilization;Passive ROM    Joint Mobilization  patellar mobilization all directions     Passive ROM  flex and ext                PT Short Term Goals - 04/17/17 7425      PT SHORT TERM GOAL #1   Title  independent with initial HEP    Baseline  doing ex from HHPT    Status  Achieved        PT Long Term Goals - 06/04/17 1110      PT LONG TERM GOAL #1   Title  decrease pain 50%    Status  Achieved      PT LONG TERM GOAL #2   Title  walk without device and minimal deviation    Status  Partially Met      PT LONG TERM GOAL #4   Title  decrease swelling 50%    Status  On-going      PT LONG TERM GOAL #5   Title  ascend and descend stairs step over step    Status  Partially Met            Plan - 06/06/17 1017    Clinical Impression Statement  Progress towards all goals. she does reports some pain after  last session due to the MT, trying to improve extension. was able to progressed to stair negotiation, alt pattern with one rail. PROM is good but pt still lacks full knee extension.  All machine exercises performed well.    Rehab Potential  Good    PT Frequency  3x / week    PT Duration  8 weeks    PT Treatment/Interventions  ADLs/Self Care Home Management;Cryotherapy;Electrical Stimulation;Gait training;Balance training;Therapeutic exercise;Therapeutic activities;Functional mobility training;Stair training;Patient/family education;Manual techniques;Vasopneumatic Device    PT Next Visit Plan  Continue R LE strengthening and R knee TKE       Patient will benefit from skilled therapeutic intervention in order to improve the following deficits and impairments:  Abnormal gait, Decreased range of motion, Difficulty walking, Decreased activity tolerance, Pain, Decreased scar mobility, Impaired flexibility, Increased edema, Decreased strength, Decreased mobility  Visit Diagnosis: Acute pain of right knee  Stiffness of right knee, not elsewhere classified  Localized edema  Difficulty in walking, not elsewhere classified     Problem List Patient Active Problem List   Diagnosis Date Noted  . Unilateral primary osteoarthritis, right knee 03/06/2017  . 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 Status post total knee replacement, left 08/29/2016  . Unilateral primary osteoarthritis, left knee 07/17/2016  . Acute pain of right knee 06/28/2016  . Baker's cyst of knee, left - suspected 05/30/2016  . Diverticulitis of colon 03/17/2016  . Hemorrhoids, internal, with bleeding Gr 2 prolapsed 06/30/2011  . Diverticulitis 06/06/2011  . Rectal bleeding 04/19/2009  . ABDOMINAL PAIN, RIGHT LOWER QUADRANT 04/19/2009  . HYPERLIPIDEMIA 07/11/2006  . HYPERTENSION, ESSENTIAL NOS 07/11/2006  . GERD 07/11/2006  . HEPATITIS B, HX OF 07/11/2006    Scot Jun, PTA 06/06/2017, 10:23  AM  [place], [place], [address] Phone: [phone]   Fax:  [phone]  Name: phyillis dascoli MRN: [id number] Date of Birth: [birth year]

## 2017-06-12 ENCOUNTER — Encounter: Payer: Self-pay | Admitting: Physical Therapy

## 2017-06-12 ENCOUNTER — Ambulatory Visit: Payer: Medicare Other | Admitting: Physical Therapy

## 2017-06-12 DIAGNOSIS — R600 Localized edema: Secondary | ICD-10-CM

## 2017-06-12 DIAGNOSIS — R262 Difficulty in walking, not elsewhere classified: Secondary | ICD-10-CM

## 2017-06-12 DIAGNOSIS — M25661 Stiffness of right knee, not elsewhere classified: Secondary | ICD-10-CM

## 2017-06-12 DIAGNOSIS — M25561 Pain in right knee: Secondary | ICD-10-CM

## 2017-06-12 NOTE — Therapy (Signed)
[place], [place], [address] Phone: [phone]   Fax:  [phone]  Physical Therapy Treatment  Patient Details  Name: Julie Dougherty MRN: [id number] Date of Birth: 21-May-1950 Referring Provider: Ninfa Linden   Encounter Date: 06/12/2017  PT End of Session - 06/12/17 0844    Visit Number  17    Date for PT Re-Evaluation  06/09/17    Authorization Type  Medicare    PT Start Time  0800    PT Stop Time  0855    PT Time Calculation (min)  55 min    Activity Tolerance  Patient tolerated treatment well    Behavior During Therapy  [facility] for tasks assessed/performed       Past Medical History:  Diagnosis Date  . Arthritis   . Cataract   . Chronic constipation   . Diverticulitis   . Diverticulosis   . Fatty liver   . GERD (gastroesophageal reflux disease)   . Hemorrhoids, internal, with bleeding Gr 2 prolapsed 06/30/2011   Found on colonoscopy 2011 and anoscopy May 2013   . Hepatitis    "B"    no rx >10 yrs  . Hiatal hernia   . High cholesterol   . Hypertension   . Internal hemorrhoids   . OSA (obstructive sleep apnea)   . Pre-diabetes   . PUD (peptic ulcer disease)   . Rectal ulceration 04/27/2009   Associated reactive/regenerative changes and fibromuscular extensions into lamina propria/Mucosal Prolapse  . Sleep apnea    Waiting on CPAP machine    Past Surgical History:  Procedure Laterality Date  . ABDOMINAL HYSTERECTOMY    . COLONOSCOPY  04/27/09    POLYPOID FRAGMENT OF COLONIC MUCOSA WITH SURFACE  . HEMORRHOID BANDING    . TOTAL KNEE ARTHROPLASTY Left 08/29/2016   Procedure: LEFT TOTAL KNEE ARTHROPLASTY;  Surgeon: Mcarthur Rossetti, MD;  Location: [place];  Service: Orthopedics;  Laterality: Left;  . TOTAL KNEE ARTHROPLASTY Right 03/06/2017  . TOTAL KNEE ARTHROPLASTY Right 03/06/2017   Procedure: RIGHT TOTAL KNEE ARTHROPLASTY;  Surgeon: Mcarthur Rossetti, MD;   Location: [place];  Service: Orthopedics;  Laterality: Right;  . TUBAL LIGATION      There were no vitals filed for this visit.  Subjective Assessment - 06/12/17 0800    Subjective  Pt reports a little pain but  not a lot, Reports that when her knee goes side to side it hurts a lot     Currently in Pain?  Yes    Pain Score  3     Pain Location  Knee    Pain Orientation  Right         [facility] PT Assessment - 06/12/17 0001      AROM   Right/Left Knee  Right    Right Knee Extension  7    Right Knee Flexion  110                   [facility] Adult PT Treatment/Exercise - 06/12/17 0001      Ambulation/Gait   Ambulation/Gait  Yes    Ambulation/Gait Assistance  6: Modified independent (Device/Increase time)    Ambulation Distance (Feet)  400 Feet    Assistive device  None    Gait Pattern  Step-through pattern;Decreased hip/knee flexion - right decrease toe off    Ambulation Surface  Unlevel;Outdoor;Paved    Stairs  Yes    Stairs Assistance  6: Modified independent (Device/Increase time)    Stair Management Technique  One rail Right;Alternating pattern    Number of Stairs  36    Height of Stairs  6    Gait Comments  R knee pain when descending stairs       Knee/Hip Exercises: Aerobic   Elliptical  I7 R3 x 58mn     Recumbent Bike  L0 x 6 min       Knee/Hip Exercises: Machines for Strengthening   Cybex Knee Extension  10lb 2x10     Cybex Knee Flexion  25lb 2x15, RLE 15lb x15        Knee/Hip Exercises: Standing   Walking with Sports Cord  4 way 40lb x5 each      Vasopneumatic   Number Minutes Vasopneumatic   15 minutes    Vasopnuematic Location   Knee    Vasopneumatic Pressure  Medium    Vasopneumatic Temperature   33      Manual Therapy   Manual Therapy  Passive ROM    Passive ROM  flex and ext                PT Short Term Goals - 04/17/17 02353     PT SHORT TERM GOAL #1   Title  independent with initial HEP    Baseline  doing ex from HHPT    Status   Achieved        PT Long Term Goals - 06/04/17 1110      PT LONG TERM GOAL #1   Title  decrease pain 50%    Status  Achieved      PT LONG TERM GOAL #2   Title  walk without device and minimal deviation    Status  Partially Met      PT LONG TERM GOAL #4   Title  decrease swelling 50%    Status  On-going      PT LONG TERM GOAL #5   Title  ascend and descend stairs step over step    Status  Partially Met            Plan - 06/12/17 0846    Clinical Impression Statement  Pt with some initial R knee stiffness on recumbent bike, but that appeared to loosen up. Some instability with resisted side step. Reports some R knee pain when controlling descents with stair negotiation. Pt still lacking full TKE on R knee noted with both passive and active ROM.    Rehab Potential  Good    PT Frequency  3x / week    PT Duration  8 weeks    PT Treatment/Interventions  ADLs/Self Care Home Management;Cryotherapy;Electrical Stimulation;Gait training;Balance training;Therapeutic exercise;Therapeutic activities;Functional mobility training;Stair training;Patient/family education;Manual techniques;Vasopneumatic Device    PT Next Visit Plan  Continue R LE strengthening and R knee TKE       Patient will benefit from skilled therapeutic intervention in order to improve the following deficits and impairments:  Abnormal gait, Decreased range of motion, Difficulty walking, Decreased activity tolerance, Pain, Decreased scar mobility, Impaired flexibility, Increased edema, Decreased strength, Decreased mobility  Visit Diagnosis: Acute pain of right knee  Stiffness of right knee, not elsewhere classified  Localized edema  Difficulty in walking, not elsewhere classified     Problem List Patient Active Problem List   Diagnosis Date Noted  . Unilateral primary osteoarthritis, right knee 03/06/2017  . 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 Status post total knee replacement,  left  08/29/2016  . Unilateral primary osteoarthritis, left knee 07/17/2016  . Acute pain of right knee 06/28/2016  . Baker's cyst of knee, left - suspected 05/30/2016  . Diverticulitis of colon 03/17/2016  . Hemorrhoids, internal, with bleeding Gr 2 prolapsed 06/30/2011  . Diverticulitis 06/06/2011  . Rectal bleeding 04/19/2009  . ABDOMINAL PAIN, RIGHT LOWER QUADRANT 04/19/2009  . HYPERLIPIDEMIA 07/11/2006  . HYPERTENSION, ESSENTIAL NOS 07/11/2006  . GERD 07/11/2006  . HEPATITIS B, HX OF 07/11/2006    Scot Jun, PTA 06/12/2017, 8:56 AM  [place] [address] [place], [place], [address] Phone: [phone]   Fax:  [phone]  Name: latanja lehenbauer MRN: [id number] Date of Birth: 08-24-1950

## 2017-06-14 ENCOUNTER — Encounter: Payer: Self-pay | Admitting: Physical Therapy

## 2017-06-14 ENCOUNTER — Ambulatory Visit: Payer: Medicare Other | Admitting: Physical Therapy

## 2017-06-14 DIAGNOSIS — M25561 Pain in right knee: Secondary | ICD-10-CM

## 2017-06-14 DIAGNOSIS — R262 Difficulty in walking, not elsewhere classified: Secondary | ICD-10-CM

## 2017-06-14 DIAGNOSIS — M25661 Stiffness of right knee, not elsewhere classified: Secondary | ICD-10-CM

## 2017-06-14 DIAGNOSIS — R600 Localized edema: Secondary | ICD-10-CM

## 2017-06-14 NOTE — Therapy (Signed)
[place], [place], [address] Phone: [phone]   Fax:  [phone]  Physical Therapy Treatment  Patient Details  Name: Julie Dougherty MRN: [id number] Date of Birth: 08-04-1950 Referring Provider: Ninfa Linden   Encounter Date: 06/14/2017  PT End of Session - 06/14/17 0841    Visit Number  18    Date for PT Re-Evaluation  06/09/17    PT Start Time  0800    PT Stop Time  0851    PT Time Calculation (min)  51 min    Activity Tolerance  Patient tolerated treatment well    Behavior During Therapy  [facility] for tasks assessed/performed       Past Medical History:  Diagnosis Date  . Arthritis   . Cataract   . Chronic constipation   . Diverticulitis   . Diverticulosis   . Fatty liver   . GERD (gastroesophageal reflux disease)   . Hemorrhoids, internal, with bleeding Gr 2 prolapsed 06/30/2011   Found on colonoscopy 2011 and anoscopy May 2013   . Hepatitis    "B"    no rx >10 yrs  . Hiatal hernia   . High cholesterol   . Hypertension   . Internal hemorrhoids   . OSA (obstructive sleep apnea)   . Pre-diabetes   . PUD (peptic ulcer disease)   . Rectal ulceration 04/27/2009   Associated reactive/regenerative changes and fibromuscular extensions into lamina propria/Mucosal Prolapse  . Sleep apnea    Waiting on CPAP machine    Past Surgical History:  Procedure Laterality Date  . ABDOMINAL HYSTERECTOMY    . COLONOSCOPY  04/27/09    POLYPOID FRAGMENT OF COLONIC MUCOSA WITH SURFACE  . HEMORRHOID BANDING    . TOTAL KNEE ARTHROPLASTY Left 08/29/2016   Procedure: LEFT TOTAL KNEE ARTHROPLASTY;  Surgeon: Mcarthur Rossetti, MD;  Location: [place];  Service: Orthopedics;  Laterality: Left;  . TOTAL KNEE ARTHROPLASTY Right 03/06/2017  . TOTAL KNEE ARTHROPLASTY Right 03/06/2017   Procedure: RIGHT TOTAL KNEE ARTHROPLASTY;  Surgeon: Mcarthur Rossetti, MD;  Location: [place];  Service:  Orthopedics;  Laterality: Right;  . TUBAL LIGATION      There were no vitals filed for this visit.  Subjective Assessment - 06/14/17 0802    Subjective  Pt reports that she feels good, Soreness in both calves     Currently in Pain?  Yes    Pain Score  3     Pain Location  Knee    Pain Orientation  Right         [facility] PT Assessment - 06/14/17 0001      Circumferential Edema   Circumferential - Right  50 cm mid patella      AROM   Right/Left Knee  Right    Right Knee Extension  5    Right Knee Flexion  111                   [facility] Adult PT Treatment/Exercise - 06/14/17 0001      Knee/Hip Exercises: Aerobic   Elliptical  I10 R5 x 28mn     Recumbent Bike  L1 x 5 min       Knee/Hip Exercises: Machines for Strengthening   Cybex Knee Extension  RLE 2x10    Cybex Knee Flexion  RLE 20lb 2x15      Cybex Leg Press  RLE 20lb 2x10  Knee/Hip Exercises: Standing   Lateral Step Up  2 sets;10 reps;Right;Step Height: 6";Hand Hold: 0    Forward Step Up  2 sets;10 reps;Right;Step Height: 8"      Modalities   Modalities  Cryotherapy      Cryotherapy   Number Minutes Cryotherapy  10 Minutes    Cryotherapy Location  Knee    Type of Cryotherapy  Ice pack      Manual Therapy   Manual therapy comments  Some PROM taken to end trange and held    Passive ROM  focus on ext               PT Short Term Goals - 04/17/17 4481      PT SHORT TERM GOAL #1   Title  independent with initial HEP    Baseline  doing ex from HHPT    Status  Achieved        PT Long Term Goals - 06/14/17 0818      PT LONG TERM GOAL #4   Title  decrease swelling 50%    Status  Partially Met            Plan - 06/14/17 0842    Clinical Impression Statement  Pt reports increase soreness in calves,  she has progressed towards some LTG's of increase R knee AROM and decreased swelling. All interventions completed well. Emphasis on R TKE throughout with all activities because she is  still lacking ~ 5 degrees extension.    Rehab Potential  Good    PT Frequency  3x / week    PT Duration  8 weeks    PT Treatment/Interventions  ADLs/Self Care Home Management;Cryotherapy;Electrical Stimulation;Gait training;Balance training;Therapeutic exercise;Therapeutic activities;Functional mobility training;Stair training;Patient/family education;Manual techniques;Vasopneumatic Device    PT Next Visit Plan  Continue R LE strengthening and R knee TKE       Patient will benefit from skilled therapeutic intervention in order to improve the following deficits and impairments:  Abnormal gait, Decreased range of motion, Difficulty walking, Decreased activity tolerance, Pain, Decreased scar mobility, Impaired flexibility, Increased edema, Decreased strength, Decreased mobility  Visit Diagnosis: Acute pain of right knee  Stiffness of right knee, not elsewhere classified  Localized edema  Difficulty in walking, not elsewhere classified     Problem List Patient Active Problem List   Diagnosis Date Noted  . Unilateral primary osteoarthritis, right knee 03/06/2017  . 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 Status post total knee replacement, left 08/29/2016  . Unilateral primary osteoarthritis, left knee 07/17/2016  . Acute pain of right knee 06/28/2016  . Baker's cyst of knee, left - suspected 05/30/2016  . Diverticulitis of colon 03/17/2016  . Hemorrhoids, internal, with bleeding Gr 2 prolapsed 06/30/2011  . Diverticulitis 06/06/2011  . Rectal bleeding 04/19/2009  . ABDOMINAL PAIN, RIGHT LOWER QUADRANT 04/19/2009  . HYPERLIPIDEMIA 07/11/2006  . HYPERTENSION, ESSENTIAL NOS 07/11/2006  . GERD 07/11/2006  . HEPATITIS B, HX OF 07/11/2006    Scot Jun, PTA 06/14/2017, 8:44 AM  [place] [address] [address] [place], [place], [address] Phone: [phone]   Fax:  [phone]  Name: amilia vandenbrink MRN: [id number] Date of Birth: 01-May-1950

## 2017-06-18 ENCOUNTER — Ambulatory Visit (INDEPENDENT_AMBULATORY_CARE_PROVIDER_SITE_OTHER): Payer: Medicare Other | Admitting: Urgent Care

## 2017-06-18 ENCOUNTER — Other Ambulatory Visit: Payer: Self-pay

## 2017-06-18 ENCOUNTER — Ambulatory Visit (INDEPENDENT_AMBULATORY_CARE_PROVIDER_SITE_OTHER): Payer: Medicare Other

## 2017-06-18 ENCOUNTER — Encounter: Payer: Self-pay | Admitting: Urgent Care

## 2017-06-18 VITALS — BP 110/70 | HR 109 | Temp 98.2°F | Resp 18 | Ht 66.0 in | Wt 193.0 lb

## 2017-06-18 DIAGNOSIS — M542 Cervicalgia: Secondary | ICD-10-CM

## 2017-06-18 DIAGNOSIS — M7989 Other specified soft tissue disorders: Secondary | ICD-10-CM

## 2017-06-18 DIAGNOSIS — M5030 Other cervical disc degeneration, unspecified cervical region: Secondary | ICD-10-CM | POA: Diagnosis not present

## 2017-06-18 DIAGNOSIS — M79641 Pain in right hand: Secondary | ICD-10-CM

## 2017-06-18 DIAGNOSIS — I10 Essential (primary) hypertension: Secondary | ICD-10-CM | POA: Diagnosis not present

## 2017-06-18 LAB — POCT CBC
GRANULOCYTE PERCENT: 64.2 % (ref 37–80)
HEMATOCRIT: 42.9 % (ref 37.7–47.9)
Hemoglobin: 13.4 g/dL (ref 12.2–16.2)
Lymph, poc: 3.4 (ref 0.6–3.4)
MCH: 27.2 pg (ref 27–31.2)
MCHC: 31.3 g/dL — AB (ref 31.8–35.4)
MCV: 87.1 fL (ref 80–97)
MID (CBC): 0.2 (ref 0–0.9)
MPV: 7.9 fL (ref 0–99.8)
POC GRANULOCYTE: 6.4 (ref 2–6.9)
POC LYMPH %: 34 % (ref 10–50)
POC MID %: 1.7 %M (ref 0–12)
Platelet Count, POC: 311 10*3/uL (ref 142–424)
RBC: 4.93 M/uL (ref 4.04–5.48)
RDW, POC: 14.5 %
WBC: 10 10*3/uL (ref 4.6–10.2)

## 2017-06-18 MED ORDER — CYCLOBENZAPRINE HCL 5 MG PO TABS: 5.0000 mg | ORAL_TABLET | Freq: Every day | ORAL | 1 refills | Status: DC

## 2017-06-18 MED ORDER — PREDNISONE 20 MG PO TABS: 20.0000 mg | ORAL_TABLET | Freq: Every day | ORAL | 0 refills | Status: DC

## 2017-06-18 NOTE — Progress Notes (Signed)
MRN: [id number] DOB: 07/18/1950  Subjective:   Julie Dougherty is a 67 y.o. female presenting for 2 month history of intermittent right pinky pain. Symptoms have been worse in the past 3 days, now has redness, swelling, of her pinky with the swelling extending into her right hand. Denies fever, trauma, red streaks. Denies history of gout. Patient also complains of 1 month history of intermittent neck pain. Neck pain occurs randomly, feels like a tightness, pressure. Radiates from her right upper neck down into her trapezius up to her right posterior shoulder. Denies falls, trauma, weakness. She has been using hydrocodone for her knee pain but is out of this medication now.  [name] has a current medication list which includes the following prescription(s): amlodipine-benazepril, vitamin c, aspirin, furosemide, omega-3 fish oil, pantoprazole, polyethylene glycol powder, and pravastatin. Also is allergic to ciprofloxacin and flagyl [metronidazole].  [name]  has a past medical history of Arthritis, Cataract, Chronic constipation, Diverticulitis, Diverticulosis, Fatty liver, GERD (gastroesophageal reflux disease), Hemorrhoids, internal, with bleeding Gr 2 prolapsed (06/30/2011), Hepatitis, Hiatal hernia, High cholesterol, Hypertension, Internal hemorrhoids, OSA (obstructive sleep apnea), Pre-diabetes, PUD (peptic ulcer disease), Rectal ulceration (04/27/2009), and Sleep apnea. Also  has a past surgical history that includes Abdominal hysterectomy; Colonoscopy (04/27/09); Tubal ligation; Hemorrhoid banding; Total knee arthroplasty (Left, 08/29/2016); Total knee arthroplasty (Right, 03/06/2017); and Total knee arthroplasty (Right, 03/06/2017).  Objective:   Vitals: BP 110/70   Pulse (!) 109   Temp 98.2 F (36.8 C) (Oral)   Resp 18   Ht 5\' 6"  (1.676 m)   Wt 193 lb (87.5 kg)   SpO2 97%   BMI 31.15 kg/m   Physical Exam  Constitutional: She is oriented to person, place, and time. She appears  well-developed and well-nourished.  Cardiovascular: Normal rate.  Pulmonary/Chest: Effort normal.  Musculoskeletal:       Cervical back: She exhibits spasm (over cervical paraspinal muscles and right trapezius). She exhibits normal range of motion, no tenderness, no bony tenderness, no swelling, no edema and no deformity.       Right hand: She exhibits decreased range of motion (flexion, extension of right finger), tenderness (worst between 5th MCP, PIP) and swelling (over area depicted). She exhibits normal capillary refill, no deformity and no laceration. Normal sensation noted. Normal strength noted.       Hands: Neurological: She is alert and oriented to person, place, and time.  Skin: Skin is warm and dry.   Results for orders placed or performed in visit on 06/18/17 (from the past 24 hour(s))  POCT CBC     Status: Abnormal   Collection Time: 06/18/17  4:21 PM  Result Value Ref Range   WBC 10.0 4.6 - 10.2 K/uL   Lymph, poc 3.4 0.6 - 3.4   POC LYMPH PERCENT 34 10 - 50 %L   MID (cbc) 0.2 0 - 0.9   POC MID % 1.7 0 - 12 %M   POC Granulocyte 6.4 2 - 6.9   Granulocyte percent 64.2 37 - 80 %G   RBC 4.93 4.04 - 5.48 M/uL   Hemoglobin 13.4 12.2 - 16.2 g/dL   HCT, POC 42.9 37.7 - 47.9 %   MCV 87.1 80 - 97 fL   MCH, POC 27.2 27 - 31.2 pg   MCHC 31.3 (A) 31.8 - 35.4 g/dL   RDW, POC 14.5 %   Platelet Count, POC 311 142 - 424 K/uL   MPV 7.9 0 - 99.8 fL  Dg Cervical Spine Complete  Result Date: 06/18/2017 CLINICAL DATA:  Neck pain. EXAM: CERVICAL SPINE - COMPLETE 4+ VIEW COMPARISON:  10/22/2015 FINDINGS: Normal alignment of the cervical spine. The vertebral body heights are well preserved. Disc space narrowing and ventral spurring noted at C4-5 and C6-7. The neural foramina appear patent bilaterally. No fractures or dislocations. IMPRESSION: 1. No acute findings. 2. Mild degenerative disc disease. Electronically Signed   By: Kerby Moors M.D.   On: 06/18/2017 16:22   Dg Hand Complete  Right  Result Date: 06/18/2017 CLINICAL DATA:  Right hand pain. Fifth finger swelling, erythema and pain. No reported injury. EXAM: RIGHT HAND - COMPLETE 3+ VIEW COMPARISON:  None. FINDINGS: No fracture. No dislocation. No significant degenerative or erosive arthropathy. Nonspecific clustered amorphous soft tissue calcifications surrounding the PIP joint in the right fifth finger with associated soft tissue swelling. IMPRESSION: Nonspecific clustered amorphous soft tissue calcification surrounding the PIP joint in the right fifth finger with associated soft tissue swelling. 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 No acute osseous abnormality. Electronically Signed   By: Ilona Sorrel M.D.   On: 06/18/2017 16:12   Assessment and Plan :   Right hand pain - Plan: Uric Acid, DG Hand Complete Right, POCT CBC  Swelling of finger of right hand  Neck pain - Plan: DG Cervical Spine Complete  DDD (degenerative disc disease), cervical  Essential hypertension  Will use short steroid course of prednisone 20 mg for the next 7 days.  Patient has degenerative disc disease of the neck and nonspecific inflammation in her right hand, labs pending.  Counseled on management of this.  I did recheck her CBC due to her previous leukocytosis and appears to have trended back toward normal.  Follow-up if symptoms persist.  Jaynee Eagles, PA-C [facility] [phone] 06/18/2017  3:39 PM

## 2017-06-18 NOTE — Patient Instructions (Addendum)
Discopata degenerativa (Degenerative Disk Disease) La discopata degenerativa es una enfermedad causada por los cambios que se producen en los discos intervertebrales a medida que una persona envejece. Los discos intervertebrales son blandos y comprimibles, y se encuentran entre los huesos de la columna (vrtebras). Estos discos actan como amortiguadores de los [place]. La discopata degenerativa puede comprometer a toda la columna vertebral. Sin embargo, el cuello y parte inferior de la espalda son las zonas ms comnmente afectadas. Al envejecer, pueden producirse muchos cambios en los discos intervertebrales, por ejemplo:  Pueden secarse y encogerse.  Pueden formarse pequeos desgarros en el recubrimiento exterior duro del disco (anillo).  El espacio intervertebral puede reducirse debido a la prdida de [place].  Pueden desarrollarse crecimientos anormales en el hueso (espolones). Esto puede ejercer presin [employer] races nerviosas que salen del canal espinal y Engineer, production.  El canal espinal puede estrecharse. FACTORES DE RIESGO  Tener sobrepeso.  Tener antecedentes familiares de discopata degenerativa.  Fumar.  El riesgo es mayor si suele levantar objetos pesados o sufre una lesin repentina. [place] de [place] persona a otra y pueden incluir los siguientes:  Dolor de intensidad variable. Algunas personas no tienen dolor, mientras que otras sufren un dolor intenso. La ubicacin del dolor depende de la zona de la columna vertebral que est afectada. ? Si se trata de un disco que se encuentra cerca de la zona del cuello, tendr dolor de cuello o del brazo. ? Si la zona afectada es la parte inferior de la espalda, tendr dolor de [place], de los glteos o las piernas.  Dolor que empeora al agacharse, [employer] brazos o Optometrist movimientos de torsin.  Dolor que puede comenzar de [place] gradual y Film/video editor con el paso del [place]. Tambin puede  aparecer despus de sufrir una lesin leve o importante.  Hormigueo o adormecimiento de los brazos o las piernas. DIAGNSTICO El mdico le preguntar cules son sus sntomas y Estate manager/land agent las actividades y los hbitos que pueden causar Conservation officer, historic buildings. [place], [employer] y las enfermedades que tuvo o los tratamientos que recibi. El V[employer]om har un examen para determinar el rango de movimiento posible de la zona afectada, controlar la fuerza que tiene en las extremidades, as Therapist, occupational sensibilidad en las zonas de los brazos y las piernas inervadas por diferentes races nerviosas. Tambin se Educational psychologist los siguientes estudios:  Una radiografa de la columna.  Otros estudios por imgenes, como una RM. TRATAMIENTO El Sport and exercise psychologist cul es el mejor plan de tratamiento. El tratamiento puede incluir lo siguiente:  Medicamentos.  Ejercicios de rehabilitacin. INSTRUCCIONES PARA EL CUIDADO EN EL HOGAR  Siga las tcnicas adecuadas para caminar y Lexicographer objetos pesados como se lo haya recomendado el mdico.  Mantenga una buena [place].  Haga ejercicios regularmente como se lo haya recomendado el [place] de relajacin.  Cambie los hbitos para sentarse, ponerse de pie y dormir como se lo haya recomendado el mdico.  Cambie frecuentemente de posicin.  Mantenga un peso saludable o baje de peso como se lo haya recomendado el mdico.  No consuma ningn producto que contenga tabaco, lo que incluye cigarrillos, tabaco de Higher education careers adviser o Psychologist, sport and exercise. Si necesita ayuda para dejar de fumar, consulte al mdico.  Use calzado que tenga el soporte correcto.  Tome los medicamentos solamente como se lo haya indicado el mdico. SOLICITE ATENCIN MDICA SI:  El dolor no desaparece en el trmino de 1 a 4semanas.  Pierde drsticamente de peso o el apetito. SOLICITE ATENCIN MDICA DE INMEDIATO SI:  El dolor es intenso.  Tiene [employer], las manos o  las piernas.  Comienza a perder el control de la vejiga o los intestinos.  Tiene fiebre o sudores nocturnos. ASEGRESE DE QUE:  Comprende estas instrucciones.  Controlar su afeccin.  Recibir ayuda de inmediato si no mejora o si empeora. Esta informacin no tiene Marine scientist el consejo del mdico. Asegrese de hacerle al mdico cualquier pregunta que tenga. Document Released: 05/04/2008 Document Revised: 02/06/2014 Document Reviewed: 05/20/2013 Elsevier Interactive Patient Education  2018 [employer].     IF you received an x-ray today, you will receive an invoice from [facility]. Please contact [facility] at [phone] with questions or concerns regarding your invoice.   IF you received labwork today, you will receive an invoice from [facility]. Please contact [facility] at [phone] with questions or concerns regarding your invoice.   Our billing staff will not be able to assist you with questions regarding bills from these companies.  You will be contacted with the lab results as soon as they are available. The fastest way to get your results is to activate your My Chart account. Instructions are located on the last page of this paperwork. If you have not heard from [place] regarding the results in 2 weeks, please contact this office.

## 2017-06-19 ENCOUNTER — Ambulatory Visit: Payer: Medicare Other | Admitting: Physical Therapy

## 2017-06-19 ENCOUNTER — Encounter: Payer: Self-pay | Admitting: Physical Therapy

## 2017-06-19 DIAGNOSIS — M25661 Stiffness of right knee, not elsewhere classified: Secondary | ICD-10-CM | POA: Diagnosis not present

## 2017-06-19 DIAGNOSIS — R262 Difficulty in walking, not elsewhere classified: Secondary | ICD-10-CM | POA: Diagnosis not present

## 2017-06-19 DIAGNOSIS — M25561 Pain in right knee: Secondary | ICD-10-CM

## 2017-06-19 DIAGNOSIS — R600 Localized edema: Secondary | ICD-10-CM | POA: Diagnosis not present

## 2017-06-19 LAB — URIC ACID: URIC ACID: 6 mg/dL (ref 2.5–7.1)

## 2017-06-19 NOTE — Therapy (Signed)
[facility] [address] [place], , [address] Phone: [phone]   Fax:  [phone]  Physical Therapy Treatment  Patient Details  Name: Julie Dougherty MRN: [id number] Date of Birth: 04/18/1950 Referring Provider: Blackman   Encounter Date: 06/19/2017  PT End of Session - 06/19/17 0925    Visit Number  19    Authorization Type  Medicare    PT Start Time  0845    PT Stop Time  0940    PT Time Calculation (min)  55 min    Activity Tolerance  Patient tolerated treatment well    Behavior During Therapy  WFL for tasks assessed/performed       Past Medical History:  Diagnosis Date  . Arthritis   . Cataract   . Chronic constipation   . Diverticulitis   . Diverticulosis   . Fatty liver   . GERD (gastroesophageal reflux disease)   . Hemorrhoids, internal, with bleeding Gr 2 prolapsed 06/30/2011   Found on colonoscopy 2011 and anoscopy May 2013   . Hepatitis    "B"    no rx >10 yrs  . Hiatal hernia   . High cholesterol   . Hypertension   . Internal hemorrhoids   . OSA (obstructive sleep apnea)   . Pre-diabetes   . PUD (peptic ulcer disease)   . Rectal ulceration 04/27/2009   Associated reactive/regenerative changes and fibromuscular extensions into lamina propria/Mucosal Prolapse  . Sleep apnea    Waiting on CPAP machine    Past Surgical History:  Procedure Laterality Date  . ABDOMINAL HYSTERECTOMY    . COLONOSCOPY  04/27/09    POLYPOID FRAGMENT OF COLONIC MUCOSA WITH SURFACE  . HEMORRHOID BANDING    . TOTAL KNEE ARTHROPLASTY Left 08/29/2016   Procedure: LEFT TOTAL KNEE ARTHROPLASTY;  Surgeon: Blackman, Christopher Y, MD;  Location: [facility];  Service: Orthopedics;  Laterality: Left;  . TOTAL KNEE ARTHROPLASTY Right 03/06/2017  . TOTAL KNEE ARTHROPLASTY Right 03/06/2017   Procedure: RIGHT TOTAL KNEE ARTHROPLASTY;  Surgeon: Blackman, Christopher Y, MD;  Location: [facility] OR;  Service: Orthopedics;   Laterality: Right;  . TUBAL LIGATION      There were no vitals filed for this visit.  Subjective Assessment - 06/19/17 0849    Subjective  "Very good"    Patient is accompained by:  Family member    Currently in Pain?  Yes    Pain Score  1     Pain Location  Knee    Pain Orientation  Right                       [facility] Adult PT Treatment/Exercise - 06/19/17 0001      Knee/Hip Exercises: Aerobic   Elliptical  I8  R4 x 4min     Recumbent Bike  L1 x 5 min       Knee/Hip Exercises: Machines for Strengthening   Cybex Knee Extension  10lb 2x10    Cybex Knee Flexion  35 2x15     Cybex Leg Press  40lb 2x15, RLE 20lb 2x5      Knee/Hip Exercises: Standing   Walking with Sports Cord  fwd/bak  30lb x5 each    Other Standing Knee Exercises  controlled descents RLE 2x10      Knee/Hip Exercises: Seated   Sit to Sand  2 sets;15 reps;without UE support holding yellow ball       Modalities     Modalities  Cryotherapy      Cryotherapy   Number Minutes Cryotherapy  15 Minutes    Cryotherapy Location  Knee    Type of Cryotherapy  Ice pack      Vasopneumatic   Number Minutes Vasopneumatic   15 minutes    Vasopnuematic Location   Knee    Vasopneumatic Pressure  Medium    Vasopneumatic Temperature   33               PT Short Term Goals - 04/17/17 7322      PT SHORT TERM GOAL #1   Title  independent with initial HEP    Baseline  doing ex from HHPT    Status  Achieved        PT Long Term Goals - 06/19/17 0254      PT LONG TERM GOAL #1   Title  decrease pain 50%    Status  Achieved      PT LONG TERM GOAL #2   Title  walk without device and minimal deviation    Status  Partially Met      PT LONG TERM GOAL #3   Title  increase AROM to 0-115 degrees flexion    Status  Partially Met      PT LONG TERM GOAL #4   Title  decrease swelling 50%    Status  Partially Met      PT LONG TERM GOAL #5   Title  ascend and descend stairs step over step    Status   Partially Met            Plan - 06/19/17 0926    Clinical Impression Statement  Pt is progressing well despite lacking full R knee extension. Little compensation needed with controlled descents on 6 inch box but no reports of increase pain. She is pleased that's her R knee seems to be healing faster than the L. She did state some lateral L knee pain with sit to stands. She till has some R knee swelling.     Rehab Potential  Good    PT Frequency  3x / week    PT Duration  8 weeks    PT Treatment/Interventions  ADLs/Self Care Home Management;Cryotherapy;Electrical Stimulation;Gait training;Balance training;Therapeutic exercise;Therapeutic activities;Functional mobility training;Stair training;Patient/family education;Manual techniques;Vasopneumatic Device    PT Next Visit Plan  Continue R LE strengthening and R knee TKE       Patient will benefit from skilled therapeutic intervention in order to improve the following deficits and impairments:  Abnormal gait, Decreased range of motion, Difficulty walking, Decreased activity tolerance, Pain, Decreased scar mobility, Impaired flexibility, Increased edema, Decreased strength, Decreased mobility  Visit Diagnosis: Acute pain of right knee  Stiffness of right knee, not elsewhere classified  Localized edema  Difficulty in walking, not elsewhere classified     Problem List Patient Active Problem List   Diagnosis Date Noted  . Unilateral primary osteoarthritis, right knee 03/06/2017  . 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 Status post total knee replacement, left 08/29/2016  . Unilateral primary osteoarthritis, left knee 07/17/2016  . Acute pain of right knee 06/28/2016  . Baker's cyst of knee, left - suspected 05/30/2016  . Diverticulitis of colon 03/17/2016  . Hemorrhoids, internal, with bleeding Gr 2 prolapsed 06/30/2011  . Diverticulitis 06/06/2011  . Rectal bleeding 04/19/2009  . ABDOMINAL PAIN, RIGHT LOWER  QUADRANT 04/19/2009  . HYPERLIPIDEMIA 07/11/2006  . HYPERTENSION, ESSENTIAL NOS 07/11/2006  . GERD 07/11/2006  .  HEPATITIS B, HX OF 07/11/2006    Scot Jun, PTA 06/19/2017, 9:28 AM  [address] [place], [place], [address] Phone: [phone]   Fax:  [phone]  Name: ethyl vila MRN: [id number] Date of Birth: [birth year]

## 2017-06-21 ENCOUNTER — Encounter: Payer: Self-pay | Admitting: Physical Therapy

## 2017-06-21 ENCOUNTER — Ambulatory Visit: Payer: Medicare Other | Admitting: Physical Therapy

## 2017-06-21 DIAGNOSIS — R600 Localized edema: Secondary | ICD-10-CM | POA: Diagnosis not present

## 2017-06-21 DIAGNOSIS — M25561 Pain in right knee: Secondary | ICD-10-CM | POA: Diagnosis not present

## 2017-06-21 DIAGNOSIS — R262 Difficulty in walking, not elsewhere classified: Secondary | ICD-10-CM

## 2017-06-21 DIAGNOSIS — M25661 Stiffness of right knee, not elsewhere classified: Secondary | ICD-10-CM

## 2017-06-21 NOTE — Therapy (Signed)
[facility] [facility] [place] [address] [place], [place], [address] Phone: [phone]   Fax:  [phone]  Physical Therapy Treatment  Patient Details  Name: Julie Dougherty MRN: [id number] Date of Birth: October 07, 1950 Referring Provider: Ninfa Linden   Encounter Date: 06/21/2017  PT End of Session - 06/21/17 0925    Visit Number  20    Date for PT Re-Evaluation  06/09/17    Authorization Type  Medicare    PT Start Time  0849    PT Stop Time  0938    PT Time Calculation (min)  49 min    Activity Tolerance  Patient tolerated treatment well    Behavior During Therapy  [facility] for tasks assessed/performed       Past Medical History:  Diagnosis Date  . Arthritis   . Cataract   . Chronic constipation   . Diverticulitis   . Diverticulosis   . Fatty liver   . GERD (gastroesophageal reflux disease)   . Hemorrhoids, internal, with bleeding Gr 2 prolapsed 06/30/2011   Found on colonoscopy 2011 and anoscopy May 2013   . Hepatitis    "B"    no rx >10 yrs  . Hiatal hernia   . High cholesterol   . Hypertension   . Internal hemorrhoids   . OSA (obstructive sleep apnea)   . Pre-diabetes   . PUD (peptic ulcer disease)   . Rectal ulceration 04/27/2009   Associated reactive/regenerative changes and fibromuscular extensions into lamina propria/Mucosal Prolapse  . Sleep apnea    Waiting on CPAP machine    Past Surgical History:  Procedure Laterality Date  . ABDOMINAL HYSTERECTOMY    . COLONOSCOPY  04/27/09    POLYPOID FRAGMENT OF COLONIC MUCOSA WITH SURFACE  . HEMORRHOID BANDING    . TOTAL KNEE ARTHROPLASTY Left 08/29/2016   Procedure: LEFT TOTAL KNEE ARTHROPLASTY;  Surgeon: Mcarthur Rossetti, MD;  Location: [facility];  Service: Orthopedics;  Laterality: Left;  . TOTAL KNEE ARTHROPLASTY Right 03/06/2017  . TOTAL KNEE ARTHROPLASTY Right 03/06/2017   Procedure: RIGHT TOTAL KNEE ARTHROPLASTY;  Surgeon: Mcarthur Rossetti, MD;   Location: [facility];  Service: Orthopedics;  Laterality: Right;  . TUBAL LIGATION      There were no vitals filed for this visit.  Subjective Assessment - 06/21/17 0850    Subjective  Good just a little pain    Currently in Pain?  Yes    Pain Score  2     Pain Location  Knee    Pain Orientation  Right                       [facility] Adult PT Treatment/Exercise - 06/21/17 0001      Knee/Hip Exercises: Aerobic   Elliptical  I10  R5 x 36mn     Recumbent Bike  L2 x 5 min       Knee/Hip Exercises: Machines for Strengthening   Cybex Knee Extension  RLE 10lb 2x10    Cybex Knee Flexion  RLE 20lb 2x15      Cybex Leg Press  50lb 2x15, RLE 30lb 2x10      Knee/Hip Exercises: Standing   Lateral Step Up  2 sets;10 reps;Right;Step Height: 6";Hand Hold: 0    Forward Step Up  2 sets;10 reps;Right;Step Height: 8"      Modalities   Modalities  Cryotherapy      Cryotherapy   Number Minutes Cryotherapy  10 Minutes    Cryotherapy Location  Knee      Manual Therapy   Manual therapy comments  Some PROM taken to end trange and held    Passive ROM  focus on ext               PT Short Term Goals - 04/17/17 3335      PT SHORT TERM GOAL #1   Title  independent with initial HEP    Baseline  doing ex from HHPT    Status  Achieved        PT Long Term Goals - 06/19/17 4562      PT LONG TERM GOAL #1   Title  decrease pain 50%    Status  Achieved      PT LONG TERM GOAL #2   Title  walk without device and minimal deviation    Status  Partially Met      PT LONG TERM GOAL #3   Title  increase AROM to 0-115 degrees flexion    Status  Partially Met      PT LONG TERM GOAL #4   Title  decrease swelling 50%    Status  Partially Met      PT LONG TERM GOAL #5   Title  ascend and descend stairs step over step    Status  Partially Met            Plan - 06/21/17 0925    Clinical Impression Statement  Good strength and ROM with machine level exercises. She continues  to lack full R knee extension. She does reports some pain with MT focusing on extension. Progressed to 8in step up without issue. R knee swelling remains.     PT Frequency  3x / week    PT Duration  8 weeks    PT Treatment/Interventions  ADLs/Self Care Home Management;Cryotherapy;Electrical Stimulation;Gait training;Balance training;Therapeutic exercise;Therapeutic activities;Functional mobility training;Stair training;Patient/family education;Manual techniques;Vasopneumatic Device    PT Next Visit Plan  Continue R LE strengthening and R knee TKE       Patient will benefit from skilled therapeutic intervention in order to improve the following deficits and impairments:  Abnormal gait, Decreased range of motion, Difficulty walking, Decreased activity tolerance, Pain, Decreased scar mobility, Impaired flexibility, Increased edema, Decreased strength, Decreased mobility  Visit Diagnosis: Acute pain of right knee  Localized edema  Stiffness of right knee, not elsewhere classified  Difficulty in walking, not elsewhere classified     Problem List Patient Active Problem List   Diagnosis Date Noted  . Unilateral primary osteoarthritis, right knee 03/06/2017  . 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 Status post total knee replacement, left 08/29/2016  . Unilateral primary osteoarthritis, left knee 07/17/2016  . Acute pain of right knee 06/28/2016  . Baker's cyst of knee, left - suspected 05/30/2016  . Diverticulitis of colon 03/17/2016  . Hemorrhoids, internal, with bleeding Gr 2 prolapsed 06/30/2011  . Diverticulitis 06/06/2011  . Rectal bleeding 04/19/2009  . ABDOMINAL PAIN, RIGHT LOWER QUADRANT 04/19/2009  . HYPERLIPIDEMIA 07/11/2006  . HYPERTENSION, ESSENTIAL NOS 07/11/2006  . GERD 07/11/2006  . HEPATITIS B, HX OF 07/11/2006    Scot Jun, PTA 06/21/2017, 9:27 AM  [address]  [place], [place], [address] Phone: [phone]   Fax:  [phone]  Name: Julie Dougherty MRN: [id number] Date of Birth: 1951-01-03

## 2017-06-26 ENCOUNTER — Encounter: Payer: Self-pay | Admitting: Physical Therapy

## 2017-06-26 ENCOUNTER — Ambulatory Visit: Payer: Medicare Other | Admitting: Physical Therapy

## 2017-06-26 DIAGNOSIS — R600 Localized edema: Secondary | ICD-10-CM

## 2017-06-26 DIAGNOSIS — R262 Difficulty in walking, not elsewhere classified: Secondary | ICD-10-CM

## 2017-06-26 DIAGNOSIS — M25561 Pain in right knee: Secondary | ICD-10-CM

## 2017-06-26 DIAGNOSIS — M25661 Stiffness of right knee, not elsewhere classified: Secondary | ICD-10-CM

## 2017-06-26 NOTE — Therapy (Addendum)
[facility] [place], [place], [address] Phone: [phone]   Fax:  [phone]  Physical Therapy Treatment  Patient Details  Name: Julie Dougherty MRN: [id number] Date of Birth: Aug 24, 1950 Referring Provider: Ninfa Linden   Encounter Date: 06/26/2017  PT End of Session - 06/26/17 0839    Visit Number  21    Date for PT Re-Evaluation  07/13/17    Authorization Type  Medicare    PT Start Time  0800    PT Stop Time  0855    PT Time Calculation (min)  55 min    Activity Tolerance  Patient tolerated treatment well    Behavior During Therapy  [facility] for tasks assessed/performed       Past Medical History:  Diagnosis Date  . Arthritis   . Cataract   . Chronic constipation   . Diverticulitis   . Diverticulosis   . Fatty liver   . GERD (gastroesophageal reflux disease)   . Hemorrhoids, internal, with bleeding Gr 2 prolapsed 06/30/2011   Found on colonoscopy 2011 and anoscopy May 2013   . Hepatitis    "B"    no rx >10 yrs  . Hiatal hernia   . High cholesterol   . Hypertension   . Internal hemorrhoids   . OSA (obstructive sleep apnea)   . Pre-diabetes   . PUD (peptic ulcer disease)   . Rectal ulceration 04/27/2009   Associated reactive/regenerative changes and fibromuscular extensions into lamina propria/Mucosal Prolapse  . Sleep apnea    Waiting on CPAP machine    Past Surgical History:  Procedure Laterality Date  . ABDOMINAL HYSTERECTOMY    . COLONOSCOPY  04/27/09    POLYPOID FRAGMENT OF COLONIC MUCOSA WITH SURFACE  . HEMORRHOID BANDING    . TOTAL KNEE ARTHROPLASTY Left 08/29/2016   Procedure: LEFT TOTAL KNEE ARTHROPLASTY;  Surgeon: Mcarthur Rossetti, MD;  Location: [facility];  Service: Orthopedics;  Laterality: Left;  . TOTAL KNEE ARTHROPLASTY Right 03/06/2017  . TOTAL KNEE ARTHROPLASTY Right 03/06/2017   Procedure: RIGHT TOTAL KNEE ARTHROPLASTY;  Surgeon: Mcarthur Rossetti, MD;   Location: [facility];  Service: Orthopedics;  Laterality: Right;  . TUBAL LIGATION      There were no vitals filed for this visit.  Subjective Assessment - 06/26/17 0803    Subjective  Pt reports increase pain over the weekend    Currently in Pain?  Yes    Pain Score  1     Pain Location  Knee    Pain Orientation  Right;Medial         [facility] PT Assessment - 06/26/17 0001      AROM   Right/Left Knee  Right    Right Knee Extension  4    Right Knee Flexion  111                   [facility] Adult PT Treatment/Exercise - 06/26/17 0001      Knee/Hip Exercises: Aerobic   Recumbent Bike  L0 x6 min      Knee/Hip Exercises: Machines for Strengthening   Cybex Knee Extension  10lb 2x15    Cybex Knee Flexion  35 2x15     Cybex Leg Press  50lb 2x15, RLE 30lb 2x5      Knee/Hip Exercises: Standing   Forward Step Up  Right;1 set;15 reps;Hand Hold: 0;Step Height: 8"    Walking with Sports Cord  fwd 40lb with  6in step up RLE 2x5       Modality Vaso (Decrease pain and control swelling) Game ready R knee med pressure coldest temp x15 min        PT Short Term Goals - 04/17/17 4696      PT SHORT TERM GOAL #1   Title  independent with initial HEP    Baseline  doing ex from HHPT    Status  Achieved        PT Long Term Goals - 06/19/17 2952      PT LONG TERM GOAL #1   Title  decrease pain 50%    Status  Achieved      PT LONG TERM GOAL #2   Title  walk without device and minimal deviation    Status  Partially Met      PT LONG TERM GOAL #3   Title  increase AROM to 0-115 degrees flexion    Status  Partially Met      PT LONG TERM GOAL #4   Title  decrease swelling 50%    Status  Partially Met      PT LONG TERM GOAL #5   Title  ascend and descend stairs step over step    Status  Partially Met            Plan - 06/26/17 0841    Clinical Impression Statement  No issues with today's exercises, [place] go[place]  strength on machine level exercises. Good stability with resisted walking and step ups.    Rehab Potential  Good    PT Frequency  3x / week    PT Duration  8 weeks    PT Treatment/Interventions  ADLs/Self Care Home Management;Cryotherapy;Electrical Stimulation;Gait training;Balance training;Therapeutic exercise;Therapeutic activities;Functional mobility training;Stair training;Patient/family education;Manual techniques;Vasopneumatic Device    PT Next Visit Plan  Continue R LE strengthening and R knee TKE       Patient will benefit from skilled therapeutic intervention in order to improve the following deficits and impairments:  Abnormal gait, Decreased range of motion, Difficulty walking, Decreased activity tolerance, Pain, Decreased scar mobility, Impaired flexibility, Increased edema, Decreased strength, Decreased mobility  Visit Diagnosis: Acute pain of right knee  Localized edema  Stiffness of right knee, not elsewhere classified  Difficulty in walking, not elsewhere classified     Problem List Patient Active Problem List   Diagnosis Date Noted  . Unilateral primary osteoarthritis, right knee 03/06/2017  . 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 Status post total knee replacement, left 08/29/2016  . Unilateral primary osteoarthritis, left knee 07/17/2016  . Acute pain of right knee 06/28/2016  . Baker's cyst of knee, left - suspected 05/30/2016  . Diverticulitis of colon 03/17/2016  . Hemorrhoids, internal, with bleeding Gr 2 prolapsed 06/30/2011  . Diverticulitis 06/06/2011  . Rectal bleeding 04/19/2009  . ABDOMINAL PAIN, RIGHT LOWER QUADRANT 04/19/2009  . HYPERLIPIDEMIA 07/11/2006  . HYPERTENSION, ESSENTIAL NOS 07/11/2006  . GERD 07/11/2006  . HEPATITIS B, HX OF 07/11/2006    Scot Jun, PTA 06/26/2017, 8:42 AM  [facility] [address] [place] [address] [place], [place], [address] Phone:  [phone]   Fax:  [phone]  Name: Julie Dougherty MRN: [id number] Date of Birth: 05-01-1950

## 2017-06-27 NOTE — Progress Notes (Signed)
Letter sent.

## 2017-06-28 ENCOUNTER — Encounter: Payer: Self-pay | Admitting: Physical Therapy

## 2017-06-28 ENCOUNTER — Ambulatory Visit: Payer: Medicare Other | Admitting: Physical Therapy

## 2017-06-28 DIAGNOSIS — R262 Difficulty in walking, not elsewhere classified: Secondary | ICD-10-CM | POA: Diagnosis not present

## 2017-06-28 DIAGNOSIS — M25661 Stiffness of right knee, not elsewhere classified: Secondary | ICD-10-CM | POA: Diagnosis not present

## 2017-06-28 DIAGNOSIS — R600 Localized edema: Secondary | ICD-10-CM | POA: Diagnosis not present

## 2017-06-28 DIAGNOSIS — M25561 Pain in right knee: Secondary | ICD-10-CM | POA: Diagnosis not present

## 2017-06-28 NOTE — Therapy (Signed)
[facility] [place], [place], [address] Phone: [phone]   Fax:  [phone]  Physical Therapy Treatment  Patient Details  Name: Julie Dougherty MRN: [id number] Date of Birth: 1950/10/05 Referring Provider: Ninfa Linden   Encounter Date: 06/28/2017  PT End of Session - 06/28/17 0843    Visit Number  22    Date for PT Re-Evaluation  07/13/17    PT Start Time  0806    PT Stop Time  0856    PT Time Calculation (min)  50 min       Past Medical History:  Diagnosis Date  . Arthritis   . Cataract   . Chronic constipation   . Diverticulitis   . Diverticulosis   . Fatty liver   . GERD (gastroesophageal reflux disease)   . Hemorrhoids, internal, with bleeding Gr 2 prolapsed 06/30/2011   Found on colonoscopy 2011 and anoscopy May 2013   . Hepatitis    "B"    no rx >10 yrs  . Hiatal hernia   . High cholesterol   . Hypertension   . Internal hemorrhoids   . OSA (obstructive sleep apnea)   . Pre-diabetes   . PUD (peptic ulcer disease)   . Rectal ulceration 04/27/2009   Associated reactive/regenerative changes and fibromuscular extensions into lamina propria/Mucosal Prolapse  . Sleep apnea    Waiting on CPAP machine    Past Surgical History:  Procedure Laterality Date  . ABDOMINAL HYSTERECTOMY    . COLONOSCOPY  04/27/09    POLYPOID FRAGMENT OF COLONIC MUCOSA WITH SURFACE  . HEMORRHOID BANDING    . TOTAL KNEE ARTHROPLASTY Left 08/29/2016   Procedure: LEFT TOTAL KNEE ARTHROPLASTY;  Surgeon: Mcarthur Rossetti, MD;  Location: [facility];  Service: Orthopedics;  Laterality: Left;  . TOTAL KNEE ARTHROPLASTY Right 03/06/2017  . TOTAL KNEE ARTHROPLASTY Right 03/06/2017   Procedure: RIGHT TOTAL KNEE ARTHROPLASTY;  Surgeon: Mcarthur Rossetti, MD;  Location: [facility];  Service: Orthopedics;  Laterality: Right;  . TUBAL LIGATION      There were no vitals filed for this visit.  Subjective Assessment  - 06/28/17 0807    Subjective  Pt reports that she feels fine only a little pain    Currently in Pain?  Yes    Pain Score  1     Pain Location  Knee    Pain Orientation  Right                       [facility] Adult PT Treatment/Exercise - 06/28/17 0001      Knee/Hip Exercises: Aerobic   Elliptical  I15  R5 x 41mn     Recumbent Bike  L2 x4 min      Knee/Hip Exercises: Machines for Strengthening   Cybex Knee Flexion  35 2x15     Cybex Leg Press  50lb 2x15      Knee/Hip Exercises: Standing   Heel Raises  Both;1 set;20 reps;2 seconds    Forward Step Up  Right;1 set;15 reps;Hand Hold: 0;Step Height: 8"    Walking with Sports Cord  fwd 40lb 4 way x5      Knee/Hip Exercises: Seated   Sit to Sand  2 sets;15 reps;without UE support holding yellow ball      Vasopneumatic   Number Minutes Vasopneumatic   10 minutes    Vasopnuematic Location   Knee    Vasopneumatic Pressure  Medium  Vasopneumatic Temperature   33               PT Short Term Goals - 04/17/17 1610      PT SHORT TERM GOAL #1   Title  independent with initial HEP    Baseline  doing ex from HHPT    Status  Achieved        PT Long Term Goals - 06/19/17 9604      PT LONG TERM GOAL #1   Title  decrease pain 50%    Status  Achieved      PT LONG TERM GOAL #2   Title  walk without device and minimal deviation    Status  Partially Met      PT LONG TERM GOAL #3   Title  increase AROM to 0-115 degrees flexion    Status  Partially Met      PT LONG TERM GOAL #4   Title  decrease swelling 50%    Status  Partially Met      PT LONG TERM GOAL #5   Title  ascend and descend stairs step over step    Status  Partially Met            Plan - 06/28/17 0844    Clinical Impression Statement  Pt ~ 6 minutes late for today's treatment session. She completed all of today's exercises well but fatigue quickly with more functional interventions. She demos some eccentric load weakness descending stairs  with RLE, but no increase in pain. Good strength on machine interventions.    Rehab Potential  Good    PT Frequency  3x / week    PT Duration  8 weeks    PT Treatment/Interventions  ADLs/Self Care Home Management;Cryotherapy;Electrical Stimulation;Gait training;Balance training;Therapeutic exercise;Therapeutic activities;Functional mobility training;Stair training;Patient/family education;Manual techniques;Vasopneumatic Device    PT Next Visit Plan  Continue R LE strengthening and R knee TKE       Patient will benefit from skilled therapeutic intervention in order to improve the following deficits and impairments:  Abnormal gait, Decreased range of motion, Difficulty walking, Decreased activity tolerance, Pain, Decreased scar mobility, Impaired flexibility, Increased edema, Decreased strength, Decreased mobility  Visit Diagnosis: Acute pain of right knee  Localized edema  Stiffness of right knee, not elsewhere classified  Difficulty in walking, not elsewhere classified     Problem List Patient Active Problem List   Diagnosis Date Noted  . Unilateral primary osteoarthritis, right knee 03/06/2017  . 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 Status post total knee replacement, left 08/29/2016  . Unilateral primary osteoarthritis, left knee 07/17/2016  . Acute pain of right knee 06/28/2016  . Baker's cyst of knee, left - suspected 05/30/2016  . Diverticulitis of colon 03/17/2016  . Hemorrhoids, internal, with bleeding Gr 2 prolapsed 06/30/2011  . Diverticulitis 06/06/2011  . Rectal bleeding 04/19/2009  . ABDOMINAL PAIN, RIGHT LOWER QUADRANT 04/19/2009  . HYPERLIPIDEMIA 07/11/2006  . HYPERTENSION, ESSENTIAL NOS 07/11/2006  . GERD 07/11/2006  . HEPATITIS B, HX OF 07/11/2006    Scot Jun, PTA 06/28/2017, 8:47 AM  [facility] [address] [place], [place], [address] Phone: [phone]   Fax:   [phone]  Name: Julie Dougherty MRN: [id number] Date of Birth: 03/26/1950

## 2017-07-03 ENCOUNTER — Ambulatory Visit: Payer: Medicare Other | Attending: Orthopaedic Surgery | Admitting: Physical Therapy

## 2017-07-03 ENCOUNTER — Encounter: Payer: Self-pay | Admitting: Physical Therapy

## 2017-07-03 DIAGNOSIS — M25561 Pain in right knee: Secondary | ICD-10-CM | POA: Insufficient documentation

## 2017-07-03 DIAGNOSIS — R600 Localized edema: Secondary | ICD-10-CM

## 2017-07-03 DIAGNOSIS — M25661 Stiffness of right knee, not elsewhere classified: Secondary | ICD-10-CM | POA: Diagnosis not present

## 2017-07-03 NOTE — Therapy (Signed)
[facility] [address] [place], [place], [address] Phone: [phone]   Fax:  [phone]  Physical Therapy Treatment  Patient Details  Name: Julie Dougherty MRN: [id number] Date of Birth: 12/24/1950 Referring Provider: Blackman   Encounter Date: 07/03/2017  PT End of Session - 07/03/17 0923    Visit Number  23    Date for PT Re-Evaluation  07/13/17    Authorization Type  Medicare    PT Start Time  0845    PT Stop Time  0935    PT Time Calculation (min)  50 min       Past Medical History:  Diagnosis Date  . Arthritis   . Cataract   . Chronic constipation   . Diverticulitis   . Diverticulosis   . Fatty liver   . GERD (gastroesophageal reflux disease)   . Hemorrhoids, internal, with bleeding Gr 2 prolapsed 06/30/2011   Found on colonoscopy 2011 and anoscopy May 2013   . Hepatitis    "B"    no rx >10 yrs  . Hiatal hernia   . High cholesterol   . Hypertension   . Internal hemorrhoids   . OSA (obstructive sleep apnea)   . Pre-diabetes   . PUD (peptic ulcer disease)   . Rectal ulceration 04/27/2009   Associated reactive/regenerative changes and fibromuscular extensions into lamina propria/Mucosal Prolapse  . Sleep apnea    Waiting on CPAP machine    Past Surgical History:  Procedure Laterality Date  . ABDOMINAL HYSTERECTOMY    . COLONOSCOPY  04/27/09    POLYPOID FRAGMENT OF COLONIC MUCOSA WITH SURFACE  . HEMORRHOID BANDING    . TOTAL KNEE ARTHROPLASTY Left 08/29/2016   Procedure: LEFT TOTAL KNEE ARTHROPLASTY;  Surgeon: Blackman, Christopher Y, MD;  [facility];  Service: Orthopedics;  Laterality: Left;  . TOTAL KNEE ARTHROPLASTY Right 03/06/2017  . TOTAL KNEE ARTHROPLASTY Right 03/06/2017   Procedure: RIGHT TOTAL KNEE ARTHROPLASTY;  Surgeon: Blackman, Christopher Y, MD;  Location[facility];  Service: Orthopedics;  Laterality: Right;  . TUBAL LIGATION      There were no vitals filed for this  visit.  Subjective Assessment - 07/03/17 0848    Subjective  Pt reports that she is ok, with little pain    Currently in Pain?  Yes    Pain Score  1     Pain Location  Knee    Pain Orientation  Right                       [facility] Adult PT Treatment/Exercise - 07/03/17 0001      Knee/Hip Exercises: Aerobic   Elliptical  I15  R5 x 4min     Recumbent Bike  L1 x6 min      Knee/Hip Exercises: Machines for Strengthening   Cybex Knee Extension  10lb 2x10, RLE 5lb 2x10     Cybex Knee Flexion  35 2x15     Cybex Leg Press  50lb 2x15, RLE 30lb 2x10       Knee/Hip Exercises: Seated   Sit to Sand  2 sets;15 reps;without UE support      Knee/Hip Exercises: Supine   Quad Sets  -- yelow ball       Cryotherapy   Number Minutes Cryotherapy  10 Minutes    Cryotherapy Location  Knee    Type of Cryotherapy  Ice pack                 PT Short Term Goals - 04/17/17 3086      PT SHORT TERM GOAL #1   Title  independent with initial HEP    Baseline  doing ex from HHPT    Status  Achieved        PT Long Term Goals - 06/19/17 5784      PT LONG TERM GOAL #1   Title  decrease pain 50%    Status  Achieved      PT LONG TERM GOAL #2   Title  walk without device and minimal deviation    Status  Partially Met      PT LONG TERM GOAL #3   Title  increase AROM to 0-115 degrees flexion    Status  Partially Met      PT LONG TERM GOAL #4   Title  decrease swelling 50%    Status  Partially Met      PT LONG TERM GOAL #5   Title  ascend and descend stairs step over step    Status  Partially Met            Plan - 07/03/17 0924    Clinical Impression Statement  Pt enters clinic with swollen R knee she reports that she is unable to ice it since she has been staying in a hotel. She reports that she only has difficulty getting up from allow surface. All interventions today completed well but she sill lacks full extension.     Rehab Potential  Good    PT Frequency  3x /  week    PT Duration  8 weeks    PT Treatment/Interventions  ADLs/Self Care Home Management;Cryotherapy;Electrical Stimulation;Gait training;Balance training;Therapeutic exercise;Therapeutic activities;Functional mobility training;Stair training;Patient/family education;Manual techniques;Vasopneumatic Device    PT Next Visit Plan  Continue R LE strengthening and R knee TKE       Patient will benefit from skilled therapeutic intervention in order to improve the following deficits and impairments:  Abnormal gait, Decreased range of motion, Difficulty walking, Decreased activity tolerance, Pain, Decreased scar mobility, Impaired flexibility, Increased edema, Decreased strength, Decreased mobility  Visit Diagnosis: Acute pain of right knee  Localized edema  Stiffness of right knee, not elsewhere classified     Problem List Patient Active Problem List   Diagnosis Date Noted  . Unilateral primary osteoarthritis, right knee 03/06/2017  . 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 Status post total knee replacement, left 08/29/2016  . Unilateral primary osteoarthritis, left knee 07/17/2016  . Acute pain of right knee 06/28/2016  . Baker's cyst of knee, left - suspected 05/30/2016  . Diverticulitis of colon 03/17/2016  . Hemorrhoids, internal, with bleeding Gr 2 prolapsed 06/30/2011  . Diverticulitis 06/06/2011  . Rectal bleeding 04/19/2009  . ABDOMINAL PAIN, RIGHT LOWER QUADRANT 04/19/2009  . HYPERLIPIDEMIA 07/11/2006  . HYPERTENSION, ESSENTIAL NOS 07/11/2006  . GERD 07/11/2006  . HEPATITIS B, HX OF 07/11/2006    Scot Jun, PTA 07/03/2017, 9:26 AM  [facility] [place], [place], [address] Phone: [phone]   Fax:  [phone]  Name: Julie Dougherty MRN: [id number] Date of Birth: 11/26/1950

## 2017-07-05 ENCOUNTER — Encounter: Payer: Self-pay | Admitting: Physical Therapy

## 2017-07-05 ENCOUNTER — Ambulatory Visit: Payer: Medicare Other | Admitting: Physical Therapy

## 2017-07-05 DIAGNOSIS — R600 Localized edema: Secondary | ICD-10-CM | POA: Diagnosis not present

## 2017-07-05 DIAGNOSIS — M25561 Pain in right knee: Secondary | ICD-10-CM | POA: Diagnosis not present

## 2017-07-05 DIAGNOSIS — M25661 Stiffness of right knee, not elsewhere classified: Secondary | ICD-10-CM | POA: Diagnosis not present

## 2017-07-05 NOTE — Therapy (Addendum)
[facility] [place], [place], [address] Phone: [phone]   Fax:  [phone]  Physical Therapy Treatment  Patient Details  Name: Julie Dougherty MRN: [id number] Date of Birth: 05-02-1950 Referring Provider: Ninfa Linden   Encounter Date: 07/05/2017  PT End of Session - 07/05/17 0931    PT Start Time  0856    PT Stop Time  0939    PT Time Calculation (min)  43 min    Activity Tolerance  Patient tolerated treatment well    Behavior During Therapy  [facility] for tasks assessed/performed       Past Medical History:  Diagnosis Date  . Arthritis   . Cataract   . Chronic constipation   . Diverticulitis   . Diverticulosis   . Fatty liver   . GERD (gastroesophageal reflux disease)   . Hemorrhoids, internal, with bleeding Gr 2 prolapsed 06/30/2011   Found on colonoscopy 2011 and anoscopy May 2013   . Hepatitis    "B"    no rx >10 yrs  . Hiatal hernia   . High cholesterol   . Hypertension   . Internal hemorrhoids   . OSA (obstructive sleep apnea)   . Pre-diabetes   . PUD (peptic ulcer disease)   . Rectal ulceration 04/27/2009   Associated reactive/regenerative changes and fibromuscular extensions into lamina propria/Mucosal Prolapse  . Sleep apnea    Waiting on CPAP machine    Past Surgical History:  Procedure Laterality Date  . ABDOMINAL HYSTERECTOMY    . COLONOSCOPY  04/27/09    POLYPOID FRAGMENT OF COLONIC MUCOSA WITH SURFACE  . HEMORRHOID BANDING    . TOTAL KNEE ARTHROPLASTY Left 08/29/2016   Procedure: LEFT TOTAL KNEE ARTHROPLASTY;  Surgeon: Mcarthur Rossetti, MD;  Location: [facility];  Service: Orthopedics;  Laterality: Left;  . TOTAL KNEE ARTHROPLASTY Right 03/06/2017  . TOTAL KNEE ARTHROPLASTY Right 03/06/2017   Procedure: RIGHT TOTAL KNEE ARTHROPLASTY;  Surgeon: Mcarthur Rossetti, MD;  Location: [facility];  Service: Orthopedics;  Laterality: Right;  . TUBAL LIGATION      There were  no vitals filed for this visit.  Subjective Assessment - 07/05/17 0855    Subjective  "Good" Not a lot of pain    Patient is accompained by:  Family member    Currently in Pain?  Yes    Pain Score  1     Pain Location  Knee    Pain Orientation  Right                       [facility] Adult PT Treatment/Exercise - 07/05/17 0001      Knee/Hip Exercises: Aerobic   Elliptical  I15  R5 x 35mn     Recumbent Bike  L1 x6 min      Knee/Hip Exercises: Machines for Strengthening   Cybex Knee Extension  15lb 2x10, RLE 5lb 2x10     Cybex Knee Flexion  35 2x15, RLE 20lb 2x10      Knee/Hip Exercises: Standing   Walking with Sports Cord  fwd 40lb 4 way x5      Knee/Hip Exercises: Seated   Sit to Sand  without UE support;3 sets;10 reps      Cryotherapy   Number Minutes Cryotherapy  10 Minutes    Cryotherapy Location  Knee    Type of Cryotherapy  Ice pack  PT Short Term Goals - 04/17/17 7680      PT SHORT TERM GOAL #1   Title  independent with initial HEP    Baseline  doing ex from HHPT    Status  Achieved        PT Long Term Goals - 06/19/17 8811      PT LONG TERM GOAL #1   Title  decrease pain 50%    Status  Achieved      PT LONG TERM GOAL #2   Title  walk without device and minimal deviation    Status  Partially Met      PT LONG TERM GOAL #3   Title  increase AROM to 0-115 degrees flexion    Status  Partially Met      PT LONG TERM GOAL #4   Title  decrease swelling 50%    Status  Partially Met      PT LONG TERM GOAL #5   Title  ascend and descend stairs step over step    Status  Partially Met            Plan - 07/05/17 0931    Clinical Impression Statement  Pt ~ 7 minutes late for today's Pt session. Reports little pain overall, no increase with today's exercises. Good strength ands ROM overall, just lacking some extension. Fatigues quick with aerobic activity.    Rehab Potential  Good    PT Frequency  3x / week    PT  Treatment/Interventions  ADLs/Self Care Home Management;Cryotherapy;Electrical Stimulation;Gait training;Balance training;Therapeutic exercise;Therapeutic activities;Functional mobility training;Stair training;Patient/family education;Manual techniques;Vasopneumatic Device    PT Next Visit Plan  Continue R LE strengthening and R knee TKE       Patient will benefit from skilled therapeutic intervention in order to improve the following deficits and impairments:  Abnormal gait, Decreased range of motion, Difficulty walking, Decreased activity tolerance, Pain, Decreased scar mobility, Impaired flexibility, Increased edema, Decreased strength, Decreased mobility  Visit Diagnosis: Acute pain of right knee  Localized edema  Stiffness of right knee, not elsewhere classified     Problem List Patient Active Problem List   Diagnosis Date Noted  . Unilateral primary osteoarthritis, right knee 03/06/2017  . 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 Status post total knee replacement, left 08/29/2016  . Unilateral primary osteoarthritis, left knee 07/17/2016  . Acute pain of right knee 06/28/2016  . Baker's cyst of knee, left - suspected 05/30/2016  . Diverticulitis of colon 03/17/2016  . Hemorrhoids, internal, with bleeding Gr 2 prolapsed 06/30/2011  . Diverticulitis 06/06/2011  . Rectal bleeding 04/19/2009  . ABDOMINAL PAIN, RIGHT LOWER QUADRANT 04/19/2009  . HYPERLIPIDEMIA 07/11/2006  . HYPERTENSION, ESSENTIAL NOS 07/11/2006  . GERD 07/11/2006  . HEPATITIS B, HX OF 07/11/2006   PHYSICAL THERAPY DISCHARGE SUMMARY  Visits from Start of Care: 24  Plan: Patient agrees to discharge.  Patient goals were partially met. Patient is being discharged due to being pleased with the current functional level.  ?????      Scot Jun, PTA 07/05/2017, 9:33 AM  [address] [place], [place],  [address] Phone: [phone]   Fax:  [phone]  Name: Julie Dougherty MRN: [id number] Date of Birth: 09-25-1950

## 2017-07-17 ENCOUNTER — Encounter (INDEPENDENT_AMBULATORY_CARE_PROVIDER_SITE_OTHER): Payer: Self-pay | Admitting: Orthopaedic Surgery

## 2017-07-17 ENCOUNTER — Ambulatory Visit (INDEPENDENT_AMBULATORY_CARE_PROVIDER_SITE_OTHER): Payer: Medicare Other

## 2017-07-17 ENCOUNTER — Ambulatory Visit (INDEPENDENT_AMBULATORY_CARE_PROVIDER_SITE_OTHER): Payer: Medicare Other | Admitting: Orthopaedic Surgery

## 2017-07-17 DIAGNOSIS — Z96653 Presence of artificial knee joint, bilateral: Secondary | ICD-10-CM | POA: Diagnosis not present

## 2017-07-17 NOTE — Progress Notes (Signed)
The patient is now 4 months status post right total knee arthroplasty 54-month status post a left total knee arthroplasty.  She is doing well.  She does ambulate with a cane out in public but not at home.  Her son is with her to interpret  for her.  She reports that she is doing well overall.  On examination of her left knee which was done 11 months ago she has some slight grinding laterally at the joint line but no effusion and good range of motion overall that is full.  The knee feels loosely stable.  Her right knee on examination shows full extension to about 100 degrees flexion.  There is still some swelling in the knee to be expected.  She says it took her about 6 months to 8 months before the swelling down on her left knee so she is used to this.  Right now she will continue home exercise program.  We will see her back in 6 months to see how she is doing overall and I would like an AP and lateral of both knees at that visit.

## 2017-08-23 ENCOUNTER — Ambulatory Visit (INDEPENDENT_AMBULATORY_CARE_PROVIDER_SITE_OTHER): Payer: Medicare Other | Admitting: Orthopaedic Surgery

## 2017-10-10 DIAGNOSIS — G4733 Obstructive sleep apnea (adult) (pediatric): Secondary | ICD-10-CM | POA: Diagnosis not present

## 2017-10-10 DIAGNOSIS — I10 Essential (primary) hypertension: Secondary | ICD-10-CM | POA: Diagnosis not present

## 2017-10-10 DIAGNOSIS — K219 Gastro-esophageal reflux disease without esophagitis: Secondary | ICD-10-CM | POA: Diagnosis not present

## 2017-10-10 DIAGNOSIS — E785 Hyperlipidemia, unspecified: Secondary | ICD-10-CM | POA: Diagnosis not present

## 2018-01-02 DIAGNOSIS — I10 Essential (primary) hypertension: Secondary | ICD-10-CM | POA: Diagnosis not present

## 2018-01-02 DIAGNOSIS — R609 Edema, unspecified: Secondary | ICD-10-CM | POA: Diagnosis not present

## 2018-01-02 DIAGNOSIS — K5900 Constipation, unspecified: Secondary | ICD-10-CM | POA: Diagnosis not present

## 2018-01-02 DIAGNOSIS — E782 Mixed hyperlipidemia: Secondary | ICD-10-CM | POA: Diagnosis not present

## 2018-01-02 DIAGNOSIS — Z23 Encounter for immunization: Secondary | ICD-10-CM | POA: Diagnosis not present

## 2018-01-02 LAB — CBC AND DIFFERENTIAL
HEMATOCRIT: 43 (ref 36–46)
Hemoglobin: 13.7 (ref 12.0–16.0)
Platelets: 261 (ref 150–399)
WBC: 6.9

## 2018-01-02 LAB — BASIC METABOLIC PANEL
BUN: 12 (ref 4–21)
Creatinine: 0.9 (ref 0.5–1.1)
GLUCOSE: 93
Potassium: 4.5 (ref 3.4–5.3)
Sodium: 139 (ref 137–147)

## 2018-01-02 LAB — HEPATIC FUNCTION PANEL
ALT: 17 (ref 7–35)
AST: 15 (ref 13–35)
Alkaline Phosphatase: 93 (ref 25–125)

## 2018-01-02 LAB — LIPID PANEL
Cholesterol: 188 (ref 0–200)
HDL: 53 (ref 35–70)
LDL CALC: 89
Triglycerides: 228 — AB (ref 40–160)

## 2018-01-02 LAB — TSH: TSH: 1.95 (ref 0.41–5.90)

## 2018-01-02 LAB — HEMOGLOBIN A1C: Hemoglobin A1C: 5.7

## 2018-01-16 ENCOUNTER — Ambulatory Visit (INDEPENDENT_AMBULATORY_CARE_PROVIDER_SITE_OTHER): Payer: Self-pay

## 2018-01-16 ENCOUNTER — Encounter (INDEPENDENT_AMBULATORY_CARE_PROVIDER_SITE_OTHER): Payer: Self-pay | Admitting: Orthopaedic Surgery

## 2018-01-16 ENCOUNTER — Ambulatory Visit (INDEPENDENT_AMBULATORY_CARE_PROVIDER_SITE_OTHER): Payer: Medicare Other

## 2018-01-16 ENCOUNTER — Ambulatory Visit (INDEPENDENT_AMBULATORY_CARE_PROVIDER_SITE_OTHER): Payer: Medicare Other | Admitting: Orthopaedic Surgery

## 2018-01-16 DIAGNOSIS — Z96652 Presence of left artificial knee joint: Secondary | ICD-10-CM

## 2018-01-16 DIAGNOSIS — Z96651 Presence of right artificial knee joint: Secondary | ICD-10-CM

## 2018-01-16 NOTE — Progress Notes (Signed)
Leg HPI: Mrs. 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  She presents with an interpreter.  She is having no problems with knee.  She is happy with the results thus far.  She continues to work on range of motion strengthening.  She has no complaints.  Physical exam: General well-developed well-nourished female no acute distress. Bilateral knees: Full extension left knee flexion to 115 120 degrees right knee 110 degrees.  No instability valgus varus stressing.  Surgical incisions are well healed.  No signs of infection about either knee.  Calves are supple nontender.  Radiographs AP and lateral view left knee: Well-seated left total knee arthroplasty without any evidence of hardware failure.  No acute fractures.  Knee is well located. Right knee 2 views: No acute fracture.  Knee is well located.  Well-seated components without any hardware failure.  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  Continue work on scar tissue mobilization.  Follow-up with [place] on as-needed basis.  Questions were encouraged and answered.

## 2018-01-17 ENCOUNTER — Telehealth: Payer: Self-pay | Admitting: Internal Medicine

## 2018-01-17 ENCOUNTER — Encounter: Payer: Self-pay | Admitting: Nurse Practitioner

## 2018-01-17 ENCOUNTER — Other Ambulatory Visit (INDEPENDENT_AMBULATORY_CARE_PROVIDER_SITE_OTHER): Payer: Medicare Other

## 2018-01-17 ENCOUNTER — Ambulatory Visit: Payer: Medicare Other | Admitting: Nurse Practitioner

## 2018-01-17 ENCOUNTER — Ambulatory Visit (INDEPENDENT_AMBULATORY_CARE_PROVIDER_SITE_OTHER): Payer: Medicare Other | Admitting: Nurse Practitioner

## 2018-01-17 VITALS — BP 104/60 | HR 84 | Ht 66.0 in | Wt 193.0 lb

## 2018-01-17 DIAGNOSIS — K625 Hemorrhage of anus and rectum: Secondary | ICD-10-CM

## 2018-01-17 DIAGNOSIS — R300 Dysuria: Secondary | ICD-10-CM | POA: Diagnosis not present

## 2018-01-17 DIAGNOSIS — R109 Unspecified abdominal pain: Secondary | ICD-10-CM

## 2018-01-17 LAB — URINALYSIS, ROUTINE W REFLEX MICROSCOPIC
Bilirubin Urine: NEGATIVE
HGB URINE DIPSTICK: NEGATIVE
Ketones, ur: NEGATIVE
Nitrite: NEGATIVE
RBC / HPF: NONE SEEN (ref 0–?)
Specific Gravity, Urine: 1.01 (ref 1.000–1.030)
TOTAL PROTEIN, URINE-UPE24: NEGATIVE
Urine Glucose: NEGATIVE
Urobilinogen, UA: 0.2 (ref 0.0–1.0)
pH: 6.5 (ref 5.0–8.0)

## 2018-01-17 MED ORDER — AMOXICILLIN-POT CLAVULANATE 875-125 MG PO TABS: 1.0000 | ORAL_TABLET | Freq: Two times a day (BID) | ORAL | 0 refills | Status: AC

## 2018-01-17 MED ORDER — HYDROCORTISONE 2.5 % RE CREA: 1.0000 "application " | TOPICAL_CREAM | Freq: Every day | RECTAL | 0 refills | Status: AC

## 2018-01-17 NOTE — Telephone Encounter (Signed)
I recommend she come in and see [place] - Nevin Bloodgood has a 2 PM open today

## 2018-01-17 NOTE — Patient Instructions (Addendum)
Your provider has requested that you go to the basement level for lab work before leaving today. Press "B" on the elevator. The lab is located at the first door on the left as you exit the elevator.  Remain on clear liquids until pain resolves.   We have sent the following medications to your pharmacy for you to pick up at your convenience: Augmentin 875 mg twice daily x 10 days and Anusol cream to use inside and outside of rectum at bedtime x 7 days.

## 2018-01-17 NOTE — Telephone Encounter (Signed)
Patient is having llq pain thinks it's diverticulitis flare up

## 2018-01-17 NOTE — Progress Notes (Signed)
Chief Complaint:  Abdominal pain, rectal bleeding  IMPRESSION and PLAN:    70.   67 year old female with history of recurrent left-sided diverticulitis.  Treated empirically for a recurrent episode in February 2018.  Back with generalized abdominal pain reminiscent of diverticulitis.  -Augmentin 875 mg twice daily x10 days -Recommend clear liquids until pain subsiding  2.  Dysuria. -obtain UA.  -will call patient with results and to get a condition update on #1.   3. Recurrent rectal bleeding / associated rectal discomfort. She has a hx of bleeding hemorrhoids, has actually undergoing banding. No sure why the discomfort, no obvious fissures on exam and the external hemorrhoid is only mildly inflamed.  -bleeding could be from recurrent internal hemorrhoids. Will treat with course of Anusol cream inside anal canal for 10 days.  -Advised to apply a small amount of the cream to external hemorrhoid as well.   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  Patient is followed by Dr. Carlean Purl for hx of hemorrhoids as well as recurrent and documented left sided diverticulitis.  Her last episode of diverticulitis (presumed) was February 2018.  We treated empirically with a 10-day course of Augmentin.  She also complained of concurrent rectal bleeding this had apparently been on ongoing issue for years in setting of hemorrhoids. On exam she had an atypical appearing external hemorrhoid which was subsequently evaluated by flexible sigmoidoscopy.  Flex sigmoidoscopy 05/04/17 Perianal skin tag found on perianal exam. - External and internal hemorrhoids. Biopsied. ? leukoplakia changes vs inflammation - The rectum and sigmoid colon are normal. Path: Ulcerated tissue.   She subsequently underwent hemorrhoidal banding.  Patient with her son who helps interpret / translate.  Several days ago patient developed recurrent generalized abdominal pain.   Though the pain is generalized, she says it is reminiscent of diverticulitis.  A few days ago she took some ibuprofen and the pain resolved but then recurred yesterday.  Pain is constant, not related to eating.  Some associated nausea without vomiting.  No fevers.  Patient also complains of recurrent rectal bleeding (bright red) with bowel movements. She has associated rectal discomfort.  Patient is sometimes constipated, takes MiraLAX every day.   She endorses dysuria.  No hematuria.  Review of systems:     No chest pain, no SOB, no fevers, no urinary sx   Past Medical History:  Diagnosis Date  . Arthritis   . Cataract   . Chronic constipation   . Diverticulitis   . Diverticulosis   . Fatty liver   . GERD (gastroesophageal reflux disease)   . Hemorrhoids, internal, with bleeding Gr 2 prolapsed 06/30/2011   Found on colonoscopy 2011 and anoscopy May 2013   . Hepatitis    "B"    no rx >10 yrs  . Hiatal hernia   . High cholesterol   . Hypertension   . Internal hemorrhoids   . OSA (obstructive sleep apnea)   . Pre-diabetes   . PUD (peptic ulcer disease)   . Rectal ulceration 04/27/2009   Associated reactive/regenerative changes and fibromuscular extensions into lamina propria/Mucosal Prolapse  . Sleep apnea    Waiting on CPAP machine    Patient's surgical history, family medical history, social history, medications and allergies were all reviewed in Epic   Creatinine clearance cannot be calculated (Patient's most recent lab result is older than the maximum 21 days allowed.)  Current Outpatient  Medications  Medication Sig Dispense Refill  . amLODipine-benazepril (LOTREL) 5-20 MG capsule Take 1 capsule by mouth daily.    Marland Kitchen aspirin EC 81 MG tablet Take 81 mg by mouth daily.    . furosemide (LASIX) 20 MG tablet Take 20 mg by mouth daily.    Marland Kitchen omega-3 fish oil (MAXEPA) 1000 MG CAPS capsule Take 1 mg by mouth.    . pantoprazole (PROTONIX) 40 MG tablet Take 40 mg by mouth daily.      . polyethylene glycol powder (MIRALAX) powder Take 17 g by mouth daily. (Patient taking differently: Take 17 g by mouth daily as needed for moderate constipation. ) 255 g 11  . pravastatin (PRAVACHOL) 20 MG tablet Take 20 mg by mouth daily.     No current facility-administered medications for this visit.     Physical Exam:     BP 104/60   Ht 5\' 6"  (1.676 m)   Wt 193 lb (87.5 kg)   BMI 31.15 kg/m   GENERAL:  Pleasant female in NAD PSYCH: : Cooperative, normal affect EENT:  conjunctiva pink, mucous membranes moist, neck supple without masses CARDIAC:  RRR, no murmur heard, no peripheral edema PULM: Normal respiratory effort, lungs CTA bilaterally, no wheezing ABDOMEN:  Nondistended, soft, diffusely tender but more so in LLQ.  No obvious masses,  normal bowel sounds RECTAL:  Small mildly inflamed external hemorrhoid. No blood in vault.  SKIN:  turgor, no lesions seen Musculoskeletal:  Normal muscle tone, normal strength NEURO: Alert and oriented x 3, no focal neurologic deficits   Tye Savoy , NP 01/17/2018, 2:33 PM

## 2018-01-17 NOTE — Telephone Encounter (Signed)
Cancellation for tomorrow 01-18-18 1:45pm w/Dr Carlean Purl. I have added her to the schedule.

## 2018-01-18 ENCOUNTER — Ambulatory Visit: Payer: Medicare Other | Admitting: Internal Medicine

## 2018-03-02 HISTORY — PX: CATARACT EXTRACTION: SUR2

## 2018-03-26 ENCOUNTER — Ambulatory Visit (INDEPENDENT_AMBULATORY_CARE_PROVIDER_SITE_OTHER): Payer: Medicare Other | Admitting: Family Medicine

## 2018-03-26 ENCOUNTER — Encounter: Payer: Self-pay | Admitting: Family Medicine

## 2018-03-26 VITALS — BP 120/74 | HR 82 | Ht 66.0 in | Wt 197.5 lb

## 2018-03-26 DIAGNOSIS — I10 Essential (primary) hypertension: Secondary | ICD-10-CM

## 2018-03-26 DIAGNOSIS — Z8619 Personal history of other infectious and parasitic diseases: Secondary | ICD-10-CM | POA: Diagnosis not present

## 2018-03-26 DIAGNOSIS — E7800 Pure hypercholesterolemia, unspecified: Secondary | ICD-10-CM

## 2018-03-26 DIAGNOSIS — Z0000 Encounter for general adult medical examination without abnormal findings: Secondary | ICD-10-CM | POA: Insufficient documentation

## 2018-03-26 DIAGNOSIS — G4730 Sleep apnea, unspecified: Secondary | ICD-10-CM | POA: Insufficient documentation

## 2018-03-26 LAB — URINALYSIS, ROUTINE W REFLEX MICROSCOPIC
Bilirubin Urine: NEGATIVE
Hgb urine dipstick: NEGATIVE
Ketones, ur: NEGATIVE
Nitrite: NEGATIVE
Specific Gravity, Urine: 1.01 (ref 1.000–1.030)
Total Protein, Urine: NEGATIVE
UROBILINOGEN UA: 0.2 (ref 0.0–1.0)
Urine Glucose: NEGATIVE
pH: 7 (ref 5.0–8.0)

## 2018-03-26 LAB — LIPID PANEL
Cholesterol: 205 mg/dL — ABNORMAL HIGH (ref 0–200)
HDL: 58.5 mg/dL (ref >39.00)
NonHDL: 146.97
Total CHOL/HDL Ratio: 4
Triglycerides: 203 mg/dL — ABNORMAL HIGH (ref 0.0–149.0)
VLDL: 40.6 mg/dL — ABNORMAL HIGH (ref 0.0–40.0)

## 2018-03-26 LAB — CBC
HCT: 43.3 % (ref 36.0–46.0)
Hemoglobin: 14.2 g/dL (ref 12.0–15.0)
MCHC: 32.7 g/dL (ref 30.0–36.0)
MCV: 88.3 fl (ref 78.0–100.0)
PLATELETS: 248 10*3/uL (ref 150.0–400.0)
RBC: 4.91 Mil/uL (ref 3.87–5.11)
RDW: 13.5 % (ref 11.5–15.5)
WBC: 6.4 10*3/uL (ref 4.0–10.5)

## 2018-03-26 LAB — HEPATIC FUNCTION PANEL
ALT: 17 U/L (ref 0–35)
AST: 15 U/L (ref 0–37)
Albumin: 4.3 g/dL (ref 3.5–5.2)
Alkaline Phosphatase: 85 U/L (ref 39–117)
Bilirubin, Direct: 0 mg/dL (ref 0.0–0.3)
Total Bilirubin: 0.4 mg/dL (ref 0.2–1.2)
Total Protein: 8 g/dL (ref 6.0–8.3)

## 2018-03-26 LAB — BASIC METABOLIC PANEL
BUN: 13 mg/dL (ref 6–23)
CO2: 26 mEq/L (ref 19–32)
Calcium: 9.7 mg/dL (ref 8.4–10.5)
Chloride: 101 mEq/L (ref 96–112)
Creatinine, Ser: 0.93 mg/dL (ref 0.40–1.20)
GFR: 60.14 mL/min (ref >60.00)
Glucose, Bld: 89 mg/dL (ref 70–99)
Potassium: 4.5 mEq/L (ref 3.5–5.1)
Sodium: 137 mEq/L (ref 135–145)

## 2018-03-26 LAB — LDL CHOLESTEROL, DIRECT: Direct LDL: 128 mg/dL

## 2018-03-26 MED ORDER — AMLODIPINE BESY-BENAZEPRIL HCL 5-20 MG PO CAPS: 1.0000 | ORAL_CAPSULE | Freq: Every day | ORAL | 1 refills | Status: DC

## 2018-03-26 MED ORDER — PRAVASTATIN SODIUM 20 MG PO TABS: 20.0000 mg | ORAL_TABLET | Freq: Every day | ORAL | 1 refills | Status: DC

## 2018-03-26 NOTE — Progress Notes (Signed)
Established Patient Office Visit  Subjective:  Patient ID: Julie Dougherty, female    DOB: Mar 18, 1950  Age: 68 y.o. MRN: [id number]  CC:  Chief Complaint  Patient presents with  . Establish Care  . Annual Exam    HPI Wise presents for establishment of care and follow-up of her blood pressure, elevated cholesterol and sleep apnea.  Her pressure has been controlled with Lotrel.  She has no issues taking this medicine.  Chart review shows that she was giving 30 Lasix 20 mg pills back in July.  She has a history of elevated cholesterol that is been treated with pravastatin.  Chart review shows that she has a history of hepatitis B.  She was diagnosed with sleep apnea a few years ago but was unable to obtain a CPAP machine.  She had a mammogram a month ago.  She has had issues with a rectal ulceration and prolapse.  She is currently under gastroenterology's care for this problem.  She is planning on follow-up with GYN for her well woman care.  She is status post bilateral knee replacement.  Patient does not smoke drink alcohol or use illicit drugs.  She lives alone.  Her son and daughter-in-law in-laws live close by.  She is accompanied by her daughter-in-law today who is helping [place] translate.  Past Medical History:  Diagnosis Date  . Arthritis   . Cataract   . Chronic constipation   . Diverticulitis   . Diverticulosis   . Fatty liver   . GERD (gastroesophageal reflux disease)   . Hemorrhoids, internal, with bleeding Gr 2 prolapsed 06/30/2011   Found on colonoscopy 2011 and anoscopy May 2013   . Hepatitis    "B"    no rx >10 yrs  . Hiatal hernia   . High cholesterol   . Hypertension   . Internal hemorrhoids   . OSA (obstructive sleep apnea)   . Pre-diabetes   . PUD (peptic ulcer disease)   . Rectal ulceration 04/27/2009   Associated reactive/regenerative changes and fibromuscular extensions into lamina propria/Mucosal Prolapse  . Sleep apnea    Waiting  on CPAP machine    Past Surgical History:  Procedure Laterality Date  . ABDOMINAL HYSTERECTOMY    . COLONOSCOPY  04/27/09    POLYPOID FRAGMENT OF COLONIC MUCOSA WITH SURFACE  . HEMORRHOID BANDING    . TOTAL KNEE ARTHROPLASTY Left 08/29/2016   Procedure: LEFT TOTAL KNEE ARTHROPLASTY;  Surgeon: Mcarthur Rossetti, MD;  Location: [facility];  Service: Orthopedics;  Laterality: Left;  . TOTAL KNEE ARTHROPLASTY Right 03/06/2017   Procedure: RIGHT TOTAL KNEE ARTHROPLASTY;  Surgeon: Mcarthur Rossetti, MD;  Location: [facility];  Service: Orthopedics;  Laterality: Right;  . TUBAL LIGATION      Family History  Problem Relation Age of Onset  . Diabetes Sister   . Asthma Sister   . Hyperlipidemia Sister   . Hypertension Sister   . Colon cancer Father 64  . Cancer Father   . Arthritis Mother   . Hypertension Mother   . Depression Sister   . Mental illness Sister   . Esophageal cancer Neg Hx   . Pancreatic cancer Neg Hx   . Stomach cancer Neg Hx     Social History   Socioeconomic History  . Marital status: Single    Spouse name: Not on file  . Number of children: 3  . Years of education: Not on file  . Highest education level: Not on  file  Occupational History  . Not on file  Social Needs  . Financial resource strain: Not on file  . Food insecurity:    Worry: Not on file    Inability: Not on file  . Transportation needs:    Medical: Not on file    Non-medical: Not on file  Tobacco Use  . Smoking status: Former Research scientist (life sciences)  . Smokeless tobacco: Never Used  . Tobacco comment: "when she was young"  Substance and Sexual Activity  . Alcohol use: No    Comment: seldom  . Drug use: No  . Sexual activity: Not on file  Lifestyle  . Physical activity:    Days per week: Not on file    Minutes per session: Not on file  . Stress: Not on file  Relationships  . Social connections:    Talks on phone: Not on file    Gets together: Not on file    Attends religious service: Not on file      Active member of club or organization: Not on file    Attends meetings of clubs or organizations: Not on file    Relationship status: Not on file  . Intimate partner violence:    Fear of current or ex partner: Not on file    Emotionally abused: Not on file    Physically abused: Not on file    Forced sexual activity: Not on file  Other Topics Concern  . Not on file  Social History Narrative  . Not on file    Outpatient Medications Prior to Visit  Medication Sig Dispense Refill  . aspirin EC 81 MG tablet Take 81 mg by mouth daily.    Marland Kitchen omega-3 fish oil (MAXEPA) 1000 MG CAPS capsule Take 1 mg by mouth.    . pantoprazole (PROTONIX) 40 MG tablet Take 40 mg by mouth daily.    . polyethylene glycol powder (MIRALAX) powder Take 17 g by mouth daily. (Patient taking differently: Take 17 g by mouth daily as needed for moderate constipation. ) 255 g 11  . amLODipine-benazepril (LOTREL) 5-20 MG capsule Take 1 capsule by mouth daily.    . furosemide (LASIX) 20 MG tablet Take 20 mg by mouth daily.    . pravastatin (PRAVACHOL) 20 MG tablet Take 20 mg by mouth daily.     No facility-administered medications prior to visit.     Allergies  Allergen Reactions  . Ciprofloxacin Hives and Nausea Only  . Flagyl [Metronidazole] Hives and Nausea Only    ROS Review of Systems  Constitutional: Negative for chills, diaphoresis, fatigue, fever and unexpected weight change.  HENT: Negative.   Eyes: Negative for photophobia and visual disturbance.  Respiratory: Negative.   Gastrointestinal: Negative.   Endocrine: Negative for polyphagia and polyuria.  Genitourinary: Negative.   Musculoskeletal: Negative for joint swelling and myalgias.  Allergic/Immunologic: Negative for immunocompromised state.  Neurological: Negative for light-headedness, numbness and headaches.  Hematological: Does not bruise/bleed easily.  Psychiatric/Behavioral: Negative.       Objective:    Physical Exam   Constitutional: She is oriented to person, place, and time. She appears well-developed and well-nourished. No distress.  HENT:  Head: Normocephalic and atraumatic.  Right Ear: External ear normal.  Left Ear: External ear normal.  Mouth/Throat: Oropharynx is clear and moist. No oropharyngeal exudate.  Eyes: Pupils are equal, round, and reactive to light. Conjunctivae are normal. Right eye exhibits no discharge. Left eye exhibits no discharge. No scleral icterus.  Neck: Neck supple. No  JVD present. No tracheal deviation present. No thyromegaly present.  Cardiovascular: Normal rate, regular rhythm and normal heart sounds.  Pulmonary/Chest: Effort normal and breath sounds normal. No stridor.  Abdominal: Bowel sounds are normal.  Lymphadenopathy:    She has no cervical adenopathy.  Neurological: She is alert and oriented to person, place, and time.  Skin: Skin is warm and dry. She is not diaphoretic.  Psychiatric: She has a normal mood and affect. Her behavior is normal.    BP 120/74   Pulse 82   Ht 5\' 6"  (1.676 m)   Wt 197 lb 8 oz (89.6 kg)   SpO2 96%   BMI 31.88 kg/m  Wt Readings from Last 3 Encounters:  03/26/18 197 lb 8 oz (89.6 kg)  01/17/18 193 lb (87.5 kg)  06/18/17 193 lb (87.5 kg)   BP Readings from Last 3 Encounters:  03/26/18 120/74  01/17/18 104/60  06/18/17 110/70   Guideline developer:  UpToDate (see UpToDate for funding source) Date Released: June 2014  Health Maintenance Due  Topic Date Due  . Hepatitis C Screening  03/04/1950  . TETANUS/TDAP  03/31/1970  . DEXA SCAN  03/30/2016  . PNA vac Low Risk Adult (1 of 2 - PCV13) 03/30/2016    There are no preventive care reminders to display for this patient.  Lab Results  Component Value Date   TSH 1.397 02/01/2010   Lab Results  Component Value Date   WBC 10.0 06/18/2017   HGB 13.4 06/18/2017   HCT 42.9 06/18/2017   MCV 87.1 06/18/2017   PLT 221 03/07/2017   Lab Results  Component Value Date   NA  134 (L) 03/07/2017   K 3.9 03/07/2017   CO2 21 (L) 03/07/2017   GLUCOSE 133 (H) 03/07/2017   BUN 9 03/07/2017   CREATININE 0.86 03/07/2017   BILITOT 0.9 08/22/2016   ALKPHOS 79 08/22/2016   AST 24 08/22/2016   ALT 19 08/22/2016   PROT 7.9 08/22/2016   ALBUMIN 4.1 08/22/2016   CALCIUM 8.6 (L) 03/07/2017   ANIONGAP 14 03/07/2017   GFR 68.62 11/13/2011   Lab Results  Component Value Date   CHOL 210 (H) 02/01/2010   Lab Results  Component Value Date   HDL 65 02/01/2010   Lab Results  Component Value Date   LDLCALC 120 (H) 02/01/2010   Lab Results  Component Value Date   TRIG 126 02/01/2010   Lab Results  Component Value Date   CHOLHDL 3.2 Ratio 02/01/2010   Lab Results  Component Value Date   HGBA1C 5.7 10/22/2015      Assessment & Plan:   Problem List Items Addressed This Visit      Cardiovascular and Mediastinum   Essential hypertension - Primary   Relevant Medications   amLODipine-benazepril (LOTREL) 5-20 MG capsule   pravastatin (PRAVACHOL) 20 MG tablet   Other Relevant Orders   CBC   Basic metabolic panel   Urinalysis, Routine w reflex microscopic     Respiratory   Sleep apnea   Relevant Orders   Ambulatory referral to Sleep Studies     Other   Elevated cholesterol   Relevant Medications   amLODipine-benazepril (LOTREL) 5-20 MG capsule   pravastatin (PRAVACHOL) 20 MG tablet   Other Relevant Orders   Lipid panel   History of hepatitis B   Relevant Orders   Hepatic function panel   Hepatitis C antibody   Hepatitis B Surface AntiGEN   Hepatitis B Core Antibody, IgM  Hepatitis B DNA, ultraquantitative, PCR   Healthcare maintenance   Relevant Orders   DG Bone Density      Meds ordered this encounter  Medications  . amLODipine-benazepril (LOTREL) 5-20 MG capsule    Sig: Take 1 capsule by mouth daily.    Dispense:  90 capsule    Refill:  1  . pravastatin (PRAVACHOL) 20 MG tablet    Sig: Take 1 tablet (20 mg total) by mouth daily.     Dispense:  90 tablet    Refill:  1    Follow-up: Return in about 6 months (around 09/24/2018), or if symptoms worsen or fail to improve.   Patient was given information on the DASH diet, high cholesterol and sleep apnea all in Spanish.  Plan would be to see her back in 6 months and maybe sooner pending results of her blood work drawn today.

## 2018-03-26 NOTE — Patient Instructions (Signed)
Plan de alimentacin DASH DASH Eating Plan DASH es la sigla en ingls de "Enfoques Alimentarios para Detener la Hipertensin" (Dietary Approaches to Stop Hypertension). El plan de alimentacin DASH ha demostrado bajar la presin arterial elevada (hipertensin). Tambin puede reducir el riesgo de diabetes tipo 2, enfermedad cardaca y accidente cerebrovascular. Este plan tambin puede ayudar a adelgazar. Consejos para seguir este plan  Pautas generales  Evite ingerir ms de 2,300 mg (miligramos) de sal (sodio) por da. Si tiene hipertensin, es posible que necesite reducir la ingesta de sodio a 1,500 mg por da.  Limite el consumo de alcohol a no ms de 1medida por da si es mujer y no est embarazada, y 2medidas por da si es hombre. Una medida equivale a 12oz (355ml) de cerveza, 5oz (148ml) de vino o 1oz (44ml) de bebidas alcohlicas de alta graduacin.  Trabaje con su mdico para mantener un peso saludable o perder peso. Pregntele cul es el peso recomendado para usted.  Realice al menos 30 minutos de ejercicio que haga que se acelere su corazn (ejercicio aerbico) la mayora de los das de la semana. Estas actividades pueden incluir caminar, nadar o andar en bicicleta.  Trabaje con su mdico o especialista en alimentacin y nutricin (nutricionista) para ajustar su plan alimentario a sus necesidades calricas personales. Lectura de las etiquetas de los alimentos   Verifique en las etiquetas de los alimentos, la cantidad de sodio por porcin. Elija alimentos con menos del 5 por ciento del valor diario de sodio. Generalmente, los alimentos con menos de 300 mg de sodio por porcin se encuadran dentro de este plan alimentario.  Para encontrar cereales integrales, busque la palabra "integral" como primera palabra en la lista de ingredientes. De compras  Compre productos en los que en su etiqueta diga: "bajo contenido de sodio" o "sin agregado de sal".  Compre alimentos frescos.  Evite los alimentos enlatados y comidas precocidas o congeladas. Coccin  Evite agregar sal cuando cocine. Use hierbas o aderezos sin sal, en lugar de sal de mesa o sal marina. Consulte al mdico o farmacutico antes de usar sustitutos de la sal.  No fra los alimentos. A la hora de cocinar los alimentos opte por hornearlos, hervirlos, grillarlos y asarlos a la parrilla.  Cocine con aceites cardiosaludables, como oliva, canola, soja o girasol. Planificacin de las comidas  Consuma una dieta equilibrada, que incluya lo siguiente: ? 5o ms porciones de frutas y verduras por da. Trate de que la mitad del plato de cada comida sean frutas y verduras. ? Hasta 6 u 8 porciones de cereales integrales por da. ? Menos de 6 onzas de carne, aves o pescado magros por da. Una porcin de 3 onzas de carne tiene casi el mismo tamao que un mazo de cartas. Un huevo equivale a 1 onza. ? Dos porciones de productos lcteos descremados por da. ? Una porcin de frutos secos, semillas o frijoles 5 veces por semana. ? Grasas cardiosaludables. Las grasas saludables llamadas cidos grasos omega-3 se encuentran en alimentos como semillas de lino y pescados de agua fra, como por ejemplo, sardinas, salmn y caballa.  Limite la cantidad que ingiere de los siguientes alimentos: ? Alimentos enlatados o envasados. ? Alimentos con alto contenido de grasa trans, como alimentos fritos. ? Alimentos con alto contenido de grasa saturada, como carne con grasa. ? Dulces, postres, bebidas azucaradas y otros alimentos con azcar agregada. ? Productos lcteos enteros.  No le agregue sal a los alimentos antes de probarlos.  Trate de comer   al menos 2 comidas vegetarianas por semana.  Consuma ms comida casera y menos de restaurante, de bufs y comida rpida.  Cuando coma en un restaurante, pida que preparen su comida con menos sal o, en lo posible, sin nada de sal. Qu alimentos se recomiendan? Los alimentos enumerados a  continuacin no constituyen una lista completa. Hable con el nutricionista sobre las mejores opciones alimenticias para usted. Cereales Pan de salvado o integral. Pasta de salvado o integral. Arroz integral. Avena. Quinua. Trigo burgol. Cereales integrales y con bajo contenido de sodio. Pan pita. Galletitas de agua con bajo contenido de grasa y sodio. Tortillas de harina integral. Verduras Verduras frescas o congeladas (crudas, al vapor, asadas o grilladas). Jugos de tomate y verduras con bajo contenido de sodio o reducidos en sodio. Salsa y pasta de tomate con bajo contenido de sodio o reducidas en sodio. Verduras enlatadas con bajo contenido de sodio o reducidas en sodio. Frutas Todas las frutas frescas, congeladas o disecadas. Frutas enlatadas en jugo natural (sin agregado de azcar). Carne y otros alimentos proteicos Pollo o pavo sin piel. Carne de pollo o de pavo molida. Cerdo desgrasado. Pescado y mariscos. Claras de huevo. Porotos, guisantes o lentejas secos. Frutos secos, mantequilla de frutos secos y semillas sin sal. Frijoles enlatados sin sal. Cortes de carne vacuna magra, desgrasada. Embutidos magros, con bajo contenido de sodio. Lcteos Leche descremada (1%) o descremada. Quesos sin grasa, con bajo contenido de grasa o descremados. Queso blanco o ricota sin grasa, con bajo contenido de sodio. Yogur semidescremado o descremado. Queso con bajo contenido de grasa y sodio. Grasas y aceites Margarinas untables que no contengan grasas trans. Aceite vegetal. [place] y aderezos para ensaladas livianos o con bajo contenido de grasas (reducidos en sodio). Aceite de canola, crtamo, oliva, soja y girasol. [place]. Condimentos y otros alimentos Hierbas. Especias. Mezclas de condimentos sin sal. Palomitas de maz y pretzels sin sal. Dulces con bajo contenido de grasas. Qu alimentos no se recomiendan? Los alimentos enumerados a continuacin no constituyen una lista completa. Hable con el  nutricionista sobre las mejores opciones alimenticias para usted. Cereales Productos de panificacin hechos con grasa, como medialunas, magdalenas y algunos panes. Comidas con arroz o pasta seca listas para usar. Verduras Verduras con crema o fritas. Verduras en salsa de queso. Verduras enlatadas regulares (que no sean con bajo contenido de sodio o reducidas en sodio). Pasta y salsa de tomates enlatadas regulares (que no sean con bajo contenido de sodio o reducidas en sodio). Jugos de tomate y verduras regulares (que no sean con bajo contenido de sodio o reducidos en sodio). Pepinillos. Aceitunas. Frutas Fruta enlatada en almbar liviano o espeso. Frutas cocidas en aceite. Frutas con salsa de crema o manteca. Carne y otros alimentos proteicos Cortes de carne con grasa. Costillas. Carne frita. Tocino. Salchichas. Mortadela y otras carnes procesadas. Salame. Panceta. Perros calientes (hotdogs). Salchicha de cerdo. Frutos secos y semillas con sal. Frijoles enlatados con agregado de sal. Pescado enlatado o ahumado. Huevos enteros o yemas. Pollo o pavo con piel. Lcteos Leche entera o al 2%, crema y mitad leche y mitad crema. Queso crema entero o con toda su grasa. Yogur entero o endulzado. Quesos con toda su grasa. Sustitutos de cremas no lcteas. Coberturas batidas. Quesos para untar y quesos procesados. Grasas y aceites Mantequilla. Margarina en barra. Manteca de cerdo. Materia grasa. Mantequilla clarificada. Grasa de panceta. Aceites tropicales como aceite de coco, palmiste o palma. Condimentos y otros alimentos Palomitas de maz y pretzels con sal. Sal   de cebolla, sal de ajo, sal condimentada, sal de mesa y sal marina. Salsa [place]. Salsa trtara. Salsa barbacoa. Salsa teriyaki. Salsa de soja, incluso la que tiene contenido reducido de [place]. Salsa de carne. Salsas en lata y envasadas. Salsa de pescado. Salsa de [place]. Salsa rosada. Rbano picante envasado. [place]. Mostaza. Saborizantes y  tiernizantes para carne. Caldo en cubitos. Salsa picante y salsa tabasco. Escabeches envasados o ya preparados. Aderezos para tacos prefabricados o envasados. Salsas. Aderezos comunes para ensalada. Dnde encontrar ms informacin:  [place], los Pulmones y Herbalist (National Heart, Lung, and [place]): https://wilson-eaton.com/  Asociacin Estadounidense del Corazn (American Heart Association): www.heart.org Resumen  El plan de alimentacin DASH ha demostrado bajar la presin arterial elevada (hipertensin). Tambin puede reducir [employer] de diabetes tipo 2, enfermedad cardaca y accidente cerebrovascular.  Con el plan de alimentacin DASH, deber limitar el consumo de sal (sodio) a 2,300 mg por da. Si tiene hipertensin, es posible que necesite reducir la ingesta de sodio a 1,500 mg por da.  Cuando siga el plan de alimentacin DASH, trate de comer ms frutas frescas y verduras, cereales integrales, carnes magras, lcteos descremados y grasas cardiosaludables.  Trabaje con su mdico o especialista en alimentacin y nutricin (nutricionista) para ajustar su plan alimentario a sus necesidades calricas personales. Esta informacin no tiene Marine scientist el consejo del mdico. Asegrese de hacerle al mdico cualquier pregunta que tenga. Document Released: 01/05/2011 Document Revised: 05/08/2016 Document Reviewed: 05/08/2016 Elsevier Interactive Patient Education  2019 Gross High Cholesterol  El colesterol elevado es una afeccin en la que la sangre tiene niveles altos de una sustancia [place], cerosa y parecida a la grasa (colesterol). El organismo humano necesita una pequea cantidad de colesterol. El hgado fabrica todo el colesterol que el organismo necesita. El exceso de colesterol proviene de los alimentos que comemos. La sangre transporta el colesterol desde el hgado a travs de los vasos sanguneos. Si tiene el colesterol  elevado, este puede depositarse (formar placas) en las paredes de los vasos sanguneos (arterias). Las Occupational hygienist y la rigidez [place] arterias. Las placas de colesterol aumentan el riesgo de sufrir un infarto de miocardio y un accidente cerebrovascular. Trabaje con el mdico para [employer] concentraciones de colesterol en un rango saludable. Qu incrementa el riesgo? Es ms probable que Orthoptist en las personas que:  Consumen alimentos con alto contenido de grasa animal (grasa saturada) o colesterol.  Tienen sobrepeso.  No hacen suficiente ejercicio fsico.  Tienen antecedentes familiares de colesterol elevado. Cules son los signos o los sntomas? Esta afeccin no presenta sntomas. Cmo se diagnostica? Esta afeccin podra diagnosticarse a [place] de anlisis de [place].  Si es mayor de 20aos, es posible que el mdico le controle el colesterol cada [id number].  Los controles pueden ser ms frecuentes si ya tuvo el colesterol elevado u otros factores de riesgo de enfermedades cardacas. En el anlisis de sangre de [place], se determina lo siguiente:  El colesterol "malo" (colesterol LDL). Este es el principal tipo de colesterol que causa enfermedades cardacas. El nivel recomendado de LDL es de menos de100.  El colesterol "bueno" (colesterol HDL). Este tipo ayuda a Tour manager las enfermedades [place] arterias y arrastrando el LDL. El nivel recomendado de HDL es de60 o superior.  Triglicridos. Estos son grasas que el organismo puede Financial controller o quemar como fuente de [place]. El nivel recomendado de triglicridos es de [place]  de 150.  Colesterol total. Esta es una medicin de la cantidad total de colesterol en la sangre, que incluye el colesterol LDL, el colesterol HDL y los triglicridos. El valor saludable es de menos de200. Cmo se trata? Esta afeccin se trata con cambios en la dieta y en el  estilo de vida, y con medicamentos. Cambios en la [employer], la ingesta de una mayor cantidad de cereales integrales, frutas, verduras, frutos secos y pescado.  Tambin podran incluir la reduccin del consumo de carnes rojas y alimentos con Architectural technologist. Cambios en el estilo de vida  Entre ellos, realizar sesiones de ejercicios aerbicos durante, por lo menos, 55minutos, 3veces por semana. Por ejemplo, caminar, andar en bicicleta y nadar. Los ejercicios aerbicos junto con una dieta sana pueden ayudar a que se Quarry manager en un peso saludable.  Los cambios tambin podran incluir dejar de fumar. Medicamentos  Por lo general, se administran medicamentos si con los Licensed conveyancer y en el estilo de vida no se logra reducir el colesterol hasta niveles saludables.  El mdico podra recetarle estatinas. Se ha demostrado que las estatinas [employer] niveles de [place], lo que puede reducir el riesgo de Insurance risk surveyor una enfermedad cardaca. Siga estas indicaciones en su casa: Comida y bebida Si se lo indic el mdico:  Coma pollo (sin piel), pescado, ternera, mariscos, pechuga de [place] y cortes de carne roja de pulpa o de lomo.  No coma alimentos fritos ni carnes grasosas, como salchichas y salame.  Coma muchas frutas, como manzanas.  Coma gran cantidad de verduras, como brcoli, papas y zanahorias.  Coma porotos, guisantes secos y lentejas.  Coma cereales, como cebada, arroz, cuscs y trigo burgol.  Coma pastas sin salsas con crema.  [place], y coma yogures y quesos descremados o semidescremados.  No coma ni beba Mattel, crema, helado, yemas de huevo ni quesos duros.  No coma margarinas en barra ni untables que contengan grasas trans (que tambin se conocen como aceites parcialmente hidrogenados).  No coma aceites tropicales saturados, como el de coco y el de [place].  No coma tortas, galletas, galletitas ni  otros productos horneados que contengan grasas trans.  Instrucciones generales  Haga ejercicio segn las indicaciones del mdico. Aumente la cantidad de ejercicio fsico que realiza mediante actividades como jardinera, salir a Writer o usar las escaleras.  Tome los medicamentos de venta libre y los recetados solamente como se lo haya indicado el mdico.  No consuma ningn producto que contenga nicotina o tabaco, como cigarrillos y Psychologist, sport and exercise. Si necesita ayuda para dejar de fumar, consulte al mdico.  Concurra a todas las visitas de control como se lo haya indicado el mdico. Esto es importante. Comunquese con un mdico si:  Tiene dificultad para seguir una dieta sana o mantener un peso saludable.  Necesita ayuda para comenzar un programa de ejercicios.  Necesita ayuda para dejar de fumar. Solicite ayuda de inmediato si:  Electronics engineer.  Tiene dificultad para respirar. Esta informacin no tiene Marine scientist el consejo del mdico. Asegrese de hacerle al mdico cualquier pregunta que tenga. Document Released: 01/16/2005 Document Revised: 04/24/2016 Document Reviewed: 07/17/2015 Elsevier Interactive Patient Education  2019 Elsevier Inc.  Apnea del sueo Sleep Apnea La apnea del sueo afecta la respiracin mientras se duerme. Hace que la respiracin se detenga por poco tiempo o se vuelva superficial. Tambin puede aumentar el riesgo de:  Infarto de miocardio.  Accidente cerebrovascular.  Tener mucho sobrepeso (obesidad).  Diabetes.  Insuficiencia cardaca.  Latidos cardacos irregulares. [place] del tratamiento es ayudarle a respirar normalmente otra vez. Cules son las causas? Existen tres tipos de apnea del sueo:  Apnea obstructiva del sueo. Esta ocurre cuando las vas respiratorias se obstruyen o colapsan.  Apnea central del sueo. Esta ocurre cuando el cerebro no enva las seales correctas a los msculos que controlan la  respiracin.  Apnea mixta del sueo. Esta es una combinacin de apnea obstructiva y central del sueo. La causa ms frecuente de esta afeccin es la obstruccin o el colapso de las vas respiratorias. Esto puede suceder si:  Los msculos de la garganta estn demasiado relajados.  Tiene la lengua y las amgdalas demasiado grandes.  Tiene sobrepeso.  Tiene las vas respiratorias demasiado pequeas. Qu incrementa el riesgo?  Tener sobrepeso.  Fumar.  Tener vas respiratorias pequeas.  El envejecimiento.  Ser hombre.  El consumo de alcohol.  Tomar medicamentos para calmarse (sedantes o tranquilizantes).  Tener familiares con esta afeccin. Cules son los signos o los sntomas?  Dificultad para permanecer dormido.  Estar somnoliento o [place].  Enojarse mucho.  Ronquidos fuertes.  Dolor de cabeza por la maana.  Imposibilidad de enfocar la mente (concentrarse).  Olvidar cosas.  Menos inters por el sexo.  Cambios en el estado de nimo.  Cambios en la personalidad.  Sentimientos de tristeza (depresin).  Levantarse mucho durante la noche para ir a Garment/textile technologist.  Sequedad en la boca.  Dolor de Investment banker, operational. Cmo se diagnostica?  Sus antecedentes mdicos.  Un examen fsico.  Ardelia Mems prueba que se realiza mientras la persona duerme (estudio del sueo). La prueba se realiza con mayor frecuencia en un laboratorio del sueo, pero tambin puede Press photographer. Cmo se trata?   Dormir de Associate Professor.  Usar un medicamento para eliminar la mucosidad de la nariz (descongestivo).  Evitar el consumo de alcohol, medicamentos que ayudan a relajarse o ciertos analgsicos (narcticos).  Bajar de [place], si es necesario.  Cambios en la dieta.  No fumar.  Usar una mquina para abrir las vas respiratorias mientras duerme; por ejemplo: ? Un aparato bucal. Se trata de una boquilla que desplaza la mandbula hacia adelante. ? Un dispositivo CPAP. Este  dispositivo sopla aire a travs de una mscara cuando usted exhala. ? Un dispositivo EPAP. Este tiene vlvulas que se colocan en cada fosa nasal. ? Un dispositivo BPAP. Este dispositivo sopla aire a travs de una mscara cuando usted inhala y exhala.  Someterse a Psychiatric nurse tratamientos no [place]. Realizar un tratamiento para la apnea del sueo es importante. Sin tratamiento, esta afeccin puede derivar en lo siguiente:  Presin arterial alta.  Arteriopata coronaria.  En los hombres, no poder tener una ereccin (impotencia).  Reduccin de la capacidad de pensar. Siga estas instrucciones en su casa: Estilo de [employer] cambios que le haya recomendado el mdico.  Siga una dieta saludable.  Baje de peso, si es necesario.  Evite el alcohol, los medicamentos para relajarse y Psychologist, forensic.  No consuma ningn producto que contenga nicotina o tabaco, como cigarrillos, cigarrillos electrnicos y tabaco de Higher education careers adviser. Si necesita ayuda para dejar de fumar, consulte al [employer]. Instrucciones generales  [employer] de venta libre y los recetados solamente como se lo haya indicado el mdico.  Si le proporcionaron una mquina para usar mientras duerme, sela solamente como se lo haya indicado el mdico.  Si va a someterse  a [place], no olvide informarle al mdico que tiene apnea del sueo. Puede ser necesario que lleve su dispositivo consigo.  Concurra a todas las visitas de seguimiento como se lo haya indicado el mdico. Esto es importante. Comunquese con un mdico si:  El Charity fundraiser para usar mientras duerme le [place] o parece no funcionar.  No se siente mejor.  [place]. Solicite ayuda inmediatamente si:  Le duele el pecho.  Tiene dificultad para inhalar suficiente aire.  Tiene molestias en la espalda, en los brazos o en el [place].  Tiene dificultad para hablar.  Siente debilidad en un lado del cuerpo.  Se  le cae un lado de la cara. Estos sntomas pueden Sales executive. No espere a ver si los sntomas desaparecen. Solicite atencin mdica de inmediato. Comunquese con el servicio de emergencias de su localidad (911 en los [place]). No conduzca por sus propios medios [employer] hospital. Resumen  Esta afeccin afecta la respiracin durante el sueo.  La causa ms frecuente es la obstruccin o el colapso de las vas respiratorias.  El [place] del tratamiento es ayudarlo a respirar normalmente mientras duerme. Esta informacin no tiene Marine scientist el consejo del mdico. Asegrese de hacerle al mdico cualquier pregunta que tenga. Document Released: 02/18/2010 Document Revised: 10/10/2017 Document Reviewed: 10/10/2017 Elsevier Interactive Patient Education  [employer].

## 2018-03-28 ENCOUNTER — Encounter: Payer: Self-pay | Admitting: Family Medicine

## 2018-03-28 LAB — HEPATITIS C ANTIBODY
Hepatitis C Ab: NONREACTIVE
SIGNAL TO CUT-OFF: 0.02 (ref <1.00)

## 2018-03-28 LAB — HEPATITIS B SURFACE ANTIGEN: Hepatitis B Surface Ag: NONREACTIVE

## 2018-03-28 LAB — HEPATITIS B DNA, ULTRAQUANTITATIVE, PCR
Hepatitis B DNA (Calc): <1 Log IU/mL
Hepatitis B DNA: <10 IU/mL

## 2018-03-28 LAB — HEPATITIS B CORE ANTIBODY, IGM: Hep B C IgM: NONREACTIVE

## 2018-04-08 ENCOUNTER — Other Ambulatory Visit (HOSPITAL_BASED_OUTPATIENT_CLINIC_OR_DEPARTMENT_OTHER): Payer: Medicare Other

## 2018-04-29 ENCOUNTER — Institutional Professional Consult (permissible substitution): Payer: Medicare Other | Admitting: Pulmonary Disease

## 2018-07-10 ENCOUNTER — Ambulatory Visit: Payer: Self-pay | Admitting: *Deleted

## 2018-07-10 NOTE — Telephone Encounter (Signed)
Pt's daughter called regarding her mom having abd pain for about 2 weeks now. the pain is on her right side, rib cage area. the area is tender to touch and sometimes feel swollen.  She denies fever, nausea, vomiting, constipation or diarrhea. It gets worst when she eats or bends over. Per protocol, she needs to be seen within 4 hours. Advised to go to the ED for possible xray. The daughter does not think the patent will go but will try. And stated she will call back in the morning if she does not go to the hospital and speak with staff at the office. Routing to [facility] at [facility] for review.  Reason for Disposition . [1] MILD-MODERATE pain AND [2] constant AND [3] present > 2 hours  Answer Assessment - Initial Assessment Questions 1. LOCATION: "Where does it hurt?"      Right abd 2. RADIATION: "Does the pain shoot anywhere else?" (e.g., chest, back)     Radiates to back 3. ONSET: "When did the pain begin?" (e.g., minutes, hours or days ago)      Almost 2 weeks 4. SUDDEN: "Gradual or sudden onset?"     gradual 5. PATTERN "Does the pain come and go, or is it constant?"    - If constant: "Is it getting better, staying the same, or worsening?"      (Note: Constant means the pain never goes away completely; most serious pain is constant and it progresses)     - If intermittent: "How long does it last?" "Do you have pain now?"     (Note: Intermittent means the pain goes away completely between bouts)     constant 6. SEVERITY: "How bad is the pain?"  (e.g., Scale 1-10; mild, moderate, or severe)   - MILD (1-3): doesn't interfere with normal activities, abdomen soft and not tender to touch    - MODERATE (4-7): interferes with normal activities or awakens from sleep, tender to touch    - SEVERE (8-10): excruciating pain, doubled over, unable to do any normal activities      Pain #5 7. RECURRENT SYMPTOM: "Have you ever had this type of abdominal pain before?" If so, ask: "When was the last time?"  and "What happened that time?"      no 8. CAUSE: "What do you think is causing the abdominal pain?"     Not sure 9. RELIEVING/AGGRAVATING FACTORS: "What makes it better or worse?" (e.g., movement, antacids, bowel movement)     Not eating makes it better when she bends down it is worst 10. OTHER SYMPTOMS: "Has there been any vomiting, diarrhea, constipation, or urine problems?"       no 11. PREGNANCY: "Is there any chance you are pregnant?" "When was your last menstrual period?"       no  Protocols used: ABDOMINAL PAIN - [facility]

## 2018-07-11 NOTE — Telephone Encounter (Signed)
fyi

## 2018-07-15 ENCOUNTER — Telehealth: Payer: Self-pay

## 2018-07-15 NOTE — Telephone Encounter (Signed)
Covid-19 screening questions   Do you now or have you had a fever in the last 14 days no   Do you have any respiratory symptoms of shortness of breath or cough now or in the last 14 days no  Do you have any family members or close contacts with diagnosed or suspected Covid-19 in the past 14 days no  Have you been tested for Covid-19 and found to be positive no          

## 2018-07-16 ENCOUNTER — Other Ambulatory Visit (INDEPENDENT_AMBULATORY_CARE_PROVIDER_SITE_OTHER): Payer: Medicare Other

## 2018-07-16 ENCOUNTER — Other Ambulatory Visit: Payer: Self-pay

## 2018-07-16 ENCOUNTER — Encounter: Payer: Self-pay | Admitting: Internal Medicine

## 2018-07-16 ENCOUNTER — Ambulatory Visit (INDEPENDENT_AMBULATORY_CARE_PROVIDER_SITE_OTHER): Payer: Medicare Other | Admitting: Internal Medicine

## 2018-07-16 VITALS — BP 118/84 | HR 89 | Temp 97.5°F | Ht 60.0 in | Wt 200.4 lb

## 2018-07-16 DIAGNOSIS — R10811 Right upper quadrant abdominal tenderness: Secondary | ICD-10-CM | POA: Diagnosis not present

## 2018-07-16 DIAGNOSIS — R1010 Upper abdominal pain, unspecified: Secondary | ICD-10-CM

## 2018-07-16 DIAGNOSIS — R630 Anorexia: Secondary | ICD-10-CM

## 2018-07-16 LAB — CBC WITH DIFFERENTIAL/PLATELET
Basophils Absolute: 0.1 10*3/uL (ref 0.0–0.1)
Basophils Relative: 2.2 % (ref 0.0–3.0)
Eosinophils Absolute: 0.1 10*3/uL (ref 0.0–0.7)
Eosinophils Relative: 1.5 % (ref 0.0–5.0)
HCT: 42.1 % (ref 36.0–46.0)
Hemoglobin: 13.8 g/dL (ref 12.0–15.0)
Lymphocytes Relative: 35.4 % (ref 12.0–46.0)
Lymphs Abs: 2.3 10*3/uL (ref 0.7–4.0)
MCHC: 32.7 g/dL (ref 30.0–36.0)
MCV: 88.7 fl (ref 78.0–100.0)
Monocytes Absolute: 0.5 10*3/uL (ref 0.1–1.0)
Monocytes Relative: 8.3 % (ref 3.0–12.0)
Neutro Abs: 3.5 10*3/uL (ref 1.4–7.7)
Neutrophils Relative %: 52.6 % (ref 43.0–77.0)
Platelets: 251 10*3/uL (ref 150.0–400.0)
RBC: 4.75 Mil/uL (ref 3.87–5.11)
RDW: 13.7 % (ref 11.5–15.5)
WBC: 6.6 10*3/uL (ref 4.0–10.5)

## 2018-07-16 LAB — URINALYSIS, ROUTINE W REFLEX MICROSCOPIC
Bilirubin Urine: NEGATIVE
Hgb urine dipstick: NEGATIVE
Ketones, ur: NEGATIVE
Nitrite: NEGATIVE
Specific Gravity, Urine: 1.025 (ref 1.000–1.030)
Total Protein, Urine: NEGATIVE
Urine Glucose: NEGATIVE
Urobilinogen, UA: 0.2 (ref 0.0–1.0)
pH: 6 (ref 5.0–8.0)

## 2018-07-16 LAB — COMPREHENSIVE METABOLIC PANEL
ALT: 15 U/L (ref 0–35)
AST: 14 U/L (ref 0–37)
Albumin: 4.2 g/dL (ref 3.5–5.2)
Alkaline Phosphatase: 70 U/L (ref 39–117)
BUN: 15 mg/dL (ref 6–23)
CO2: 26 mEq/L (ref 19–32)
Calcium: 9.6 mg/dL (ref 8.4–10.5)
Chloride: 104 mEq/L (ref 96–112)
Creatinine, Ser: 0.97 mg/dL (ref 0.40–1.20)
GFR: 57.23 mL/min — ABNORMAL LOW (ref >60.00)
Glucose, Bld: 101 mg/dL — ABNORMAL HIGH (ref 70–99)
Potassium: 5 mEq/L (ref 3.5–5.1)
Sodium: 137 mEq/L (ref 135–145)
Total Bilirubin: 0.4 mg/dL (ref 0.2–1.2)
Total Protein: 7.6 g/dL (ref 6.0–8.3)

## 2018-07-16 LAB — LIPASE: Lipase: 73 U/L — ABNORMAL HIGH (ref 11.0–59.0)

## 2018-07-16 LAB — AMYLASE: Amylase: 47 U/L (ref 27–131)

## 2018-07-16 NOTE — Patient Instructions (Signed)
Your provider has requested that you go to the basement level for lab work before leaving today. Press "B" on the elevator. The lab is located at the first door on the left as you exit the elevator.   We will be in touch with results and plans.    I appreciate the opportunity to care for you. Silvano Rusk, MD, [facility]

## 2018-07-16 NOTE — Progress Notes (Signed)
Julie Dougherty 68 y.o. 04-26-1950 [id number]  Assessment & Plan:   Encounter Diagnoses  Name Primary?  . Right upper quadrant abdominal tenderness without rebound tenderness Yes  . Anorexia   . Upper abdominal pain     This may be an exacerbation of her chronic abdominal pains.  It could be a kidney stone could be cholelithiasis other intra-abdominal pathology.  There are musculoskeletal features as well.  The physical exam is not alarming.  I will start with a lab work-up and go from there.  This will help me to determine what imaging might be needed if at all.  I appreciate the opportunity to care for this patient. CC: [name], MD   Subjective:   Chief Complaint: Right-sided abdominal pain  HPI The patient is here with her daughter-in-law Lesly Rubenstein  because of a 3-week history of right upper quadrant pain mainly.  She actually has pain that sort of starts in the right upper quadrant and radiates into the left infrascapular area.  It seems to be there most of the time it is worse with eating and twisting and bending.  There was no initial injury or anything like that started it.  There is anorexia but no nausea or vomiting.  She is a little bit more constipated but says this is not like her diverticulitis.  No new medications.  It started out with an epigastric burning and then transitioned into this symptom pattern.  She was not coughing or sneezing in any large amount prior to this such that with strained muscles.  When she rolls over at night she hurts.  It does disturb sleep a little bit.  No fever.  Urination is a little bit more frequent and with smaller amounts.  No bleeding no pain.  She tried some ibuprofen last week I think only once 400 mg with no help.  Wt Readings from Last 3 Encounters:  07/16/18 200 lb 6 oz (90.9 kg)  03/26/18 197 lb 8 oz (89.6 kg)  01/17/18 193 lb (87.5 kg)    Allergies  Allergen Reactions  . Ciprofloxacin Hives and  Nausea Only  . Flagyl [Metronidazole] Hives and Nausea Only   Current Meds  Medication Sig  . amLODipine-benazepril (LOTREL) 5-20 MG capsule Take 1 capsule by mouth daily.  Marland Kitchen aspirin EC 81 MG tablet Take 81 mg by mouth daily.  Marland Kitchen omega-3 fish oil (MAXEPA) 1000 MG CAPS capsule Take 1 mg by mouth.  . pantoprazole (PROTONIX) 40 MG tablet Take 40 mg by mouth daily.  . polyethylene glycol powder (MIRALAX) powder Take 17 g by mouth daily. (Patient taking differently: Take 17 g by mouth daily as needed for moderate constipation. )  . pravastatin (PRAVACHOL) 20 MG tablet Take 1 tablet (20 mg total) by mouth daily.   Past Medical History:  Diagnosis Date  . Arthritis   . Cataract   . Chronic constipation   . Diverticulitis   . Diverticulosis   . Fatty liver   . GERD (gastroesophageal reflux disease)   . Hemorrhoids, internal, with bleeding Gr 2 prolapsed 06/30/2011   Found on colonoscopy 2011 and anoscopy May 2013   . Hepatitis    "B"    no rx >10 yrs  . Hiatal hernia   . High cholesterol   . Hypertension   . Internal hemorrhoids   . OSA (obstructive sleep apnea)   . Pre-diabetes   . PUD (peptic ulcer disease)   . Rectal ulceration 04/27/2009   Associated  reactive/regenerative changes and fibromuscular extensions into lamina propria/Mucosal Prolapse  . Sleep apnea    Waiting on CPAP machine   Past Surgical History:  Procedure Laterality Date  . ABDOMINAL HYSTERECTOMY    . CATARACT EXTRACTION Right 03/2018  . COLONOSCOPY  04/27/09    POLYPOID FRAGMENT OF COLONIC MUCOSA WITH SURFACE  . HEMORRHOID BANDING    . TOTAL KNEE ARTHROPLASTY Left 08/29/2016   Procedure: LEFT TOTAL KNEE ARTHROPLASTY;  Surgeon: Mcarthur Rossetti, MD;  Location: [facility];  Service: Orthopedics;  Laterality: Left;  . TOTAL KNEE ARTHROPLASTY Right 03/06/2017   Procedure: RIGHT TOTAL KNEE ARTHROPLASTY;  Surgeon: Mcarthur Rossetti, MD;  Location: [place];  Service: Orthopedics;  Laterality: Right;  . TUBAL  LIGATION     Social History   Social History Narrative  . Not on file   family history includes Arthritis in her mother; Asthma in her sister; Cancer in her father; Colon cancer (age of onset: 88) in her father; Depression in her sister; Diabetes in her sister; Hyperlipidemia in her sister; Hypertension in her mother and sister; Mental illness in her sister.   Review of Systems See HPI. Objective:   Physical Exam BP 118/84   Pulse 89   Temp (!) 97.5 F (36.4 C)   Ht 5' (1.524 m)   Wt 200 lb 6 oz (90.9 kg)   BMI 39.13 kg/m  She is in no acute distress middle-aged Hispanic woman looking appropriate or younger than stated age The eyes are anicteric The lungs are clear There is very mild right CVA tenderness and some slight tenderness in the lower thoracic spine The heart sounds are normal The abdomen is somewhat obese soft and tender in the right upper quadrant.  The ribs in the flank area are slightly tender.  There is a mildly positive Carnett sign in the upper abdominal wall.  No masses. Without cyanosis clubbing or edema in the extremities. She is has an appropriate mood and affect and appears alert and oriented as best I can tell through the interpreter.

## 2018-07-16 NOTE — Progress Notes (Signed)
I spoke to daughter in law [employer] ok except some mild lipase increase and also WBC in urine  Plan - please schedule CT abd/pelvis with contrast  Dx RUQ tenderness and elevated lipase, hx diverticulitis

## 2018-07-17 ENCOUNTER — Other Ambulatory Visit: Payer: Self-pay | Admitting: Internal Medicine

## 2018-07-17 DIAGNOSIS — R748 Abnormal levels of other serum enzymes: Secondary | ICD-10-CM

## 2018-07-17 DIAGNOSIS — Z8719 Personal history of other diseases of the digestive system: Secondary | ICD-10-CM

## 2018-07-17 DIAGNOSIS — R10811 Right upper quadrant abdominal tenderness: Secondary | ICD-10-CM

## 2018-07-31 ENCOUNTER — Other Ambulatory Visit: Payer: Self-pay | Admitting: Family Medicine

## 2018-07-31 DIAGNOSIS — E7800 Pure hypercholesterolemia, unspecified: Secondary | ICD-10-CM

## 2018-07-31 NOTE — Telephone Encounter (Signed)
Medication Refill - Medication: pantoprazole and lasix.  Has the patient contacted their pharmacy? Yes.  Pts daughter called stating pt is out of medication and that pharmacy has tried to contact office multiple times. Please advise.  (Agent: If no, request that the patient contact the pharmacy for the refill.) (Agent: If yes, when and what did the pharmacy advise?)  Preferred Pharmacy (with phone number or street name):  [place], [place] [address]  Phone: [phone] Fax: [phone]  Not a 24 hour pharmacy; exact hours not known.     Agent: Please be advised that RX refills may take up to 3 business days. We ask that you follow-up with your pharmacy.

## 2018-08-01 ENCOUNTER — Other Ambulatory Visit: Payer: Self-pay | Admitting: Internal Medicine

## 2018-08-01 ENCOUNTER — Other Ambulatory Visit: Payer: Self-pay

## 2018-08-01 ENCOUNTER — Ambulatory Visit (INDEPENDENT_AMBULATORY_CARE_PROVIDER_SITE_OTHER)
Admission: RE | Admit: 2018-08-01 | Discharge: 2018-08-01 | Disposition: A | Discharge status: Home / Self Care | Payer: Medicare Other | Source: Ambulatory Visit | Attending: Internal Medicine | Admitting: Internal Medicine

## 2018-08-01 DIAGNOSIS — R10811 Right upper quadrant abdominal tenderness: Secondary | ICD-10-CM

## 2018-08-01 DIAGNOSIS — R748 Abnormal levels of other serum enzymes: Secondary | ICD-10-CM

## 2018-08-01 DIAGNOSIS — Z8719 Personal history of other diseases of the digestive system: Secondary | ICD-10-CM

## 2018-08-01 MED ORDER — CYCLOBENZAPRINE HCL 5 MG PO TABS: 5.0000 mg | ORAL_TABLET | Freq: Three times a day (TID) | ORAL | 0 refills | Status: DC | PRN

## 2018-08-01 MED ORDER — PANTOPRAZOLE SODIUM 40 MG PO TBEC: 40.0000 mg | DELAYED_RELEASE_TABLET | Freq: Every day | ORAL | 2 refills | Status: DC

## 2018-08-01 MED ORDER — IOHEXOL 300 MG/ML  SOLN
100.0000 mL | Freq: Once | INTRAMUSCULAR | Status: AC | PRN
  Administered 2018-08-01: 100 mL via INTRAVENOUS

## 2018-08-01 NOTE — Addendum Note (Signed)
Addended by: Rodrigo Ran on: 08/01/2018 09:39 AM   Modules accepted: Orders

## 2018-08-01 NOTE — Telephone Encounter (Signed)
I spoke with pt's family. I advised that we can refill the Pantoprazole & that the lasix was just a temporary medication and she does not need this any longer. They will call back with any questions.

## 2018-08-01 NOTE — Progress Notes (Signed)
Generiic flexeril 5 mg tid prn called in Daughter-in-law notified

## 2018-08-01 NOTE — Progress Notes (Signed)
Tell her daughter-in-law Arnette Norris that this looks ok  If she is still having problems at this point I will prescribe something - let me know

## 2018-08-23 ENCOUNTER — Other Ambulatory Visit: Payer: Self-pay

## 2018-08-23 DIAGNOSIS — Z20822 Contact with and (suspected) exposure to covid-19: Secondary | ICD-10-CM

## 2018-08-25 LAB — NOVEL CORONAVIRUS, NAA: SARS-CoV-2, NAA: NOT DETECTED

## 2018-08-30 ENCOUNTER — Ambulatory Visit (INDEPENDENT_AMBULATORY_CARE_PROVIDER_SITE_OTHER): Payer: Medicare Other | Admitting: Family Medicine

## 2018-08-30 ENCOUNTER — Encounter: Payer: Self-pay | Admitting: Family Medicine

## 2018-08-30 VITALS — BP 122/78 | HR 100 | Ht 60.0 in | Wt 199.4 lb

## 2018-08-30 DIAGNOSIS — K219 Gastro-esophageal reflux disease without esophagitis: Secondary | ICD-10-CM | POA: Diagnosis not present

## 2018-08-30 DIAGNOSIS — I10 Essential (primary) hypertension: Secondary | ICD-10-CM

## 2018-08-30 DIAGNOSIS — E7800 Pure hypercholesterolemia, unspecified: Secondary | ICD-10-CM | POA: Diagnosis not present

## 2018-08-30 LAB — URINALYSIS, ROUTINE W REFLEX MICROSCOPIC
Bilirubin Urine: NEGATIVE
Hgb urine dipstick: NEGATIVE
Ketones, ur: NEGATIVE
Nitrite: NEGATIVE
Specific Gravity, Urine: 1.01 (ref 1.000–1.030)
Total Protein, Urine: NEGATIVE
Urine Glucose: NEGATIVE
Urobilinogen, UA: 0.2 (ref 0.0–1.0)
pH: 7 (ref 5.0–8.0)

## 2018-08-30 LAB — LIPID PANEL
Cholesterol: 192 mg/dL (ref 0–200)
HDL: 46.2 mg/dL (ref >39.00)
NonHDL: 146.17
Total CHOL/HDL Ratio: 4
Triglycerides: 216 mg/dL — ABNORMAL HIGH (ref 0.0–149.0)
VLDL: 43.2 mg/dL — ABNORMAL HIGH (ref 0.0–40.0)

## 2018-08-30 LAB — BASIC METABOLIC PANEL
BUN: 10 mg/dL (ref 6–23)
CO2: 28 mEq/L (ref 19–32)
Calcium: 9.6 mg/dL (ref 8.4–10.5)
Chloride: 103 mEq/L (ref 96–112)
Creatinine, Ser: 0.97 mg/dL (ref 0.40–1.20)
GFR: 57.21 mL/min — ABNORMAL LOW (ref >60.00)
Glucose, Bld: 106 mg/dL — ABNORMAL HIGH (ref 70–99)
Potassium: 4.2 mEq/L (ref 3.5–5.1)
Sodium: 139 mEq/L (ref 135–145)

## 2018-08-30 LAB — LDL CHOLESTEROL, DIRECT: Direct LDL: 114 mg/dL

## 2018-08-30 MED ORDER — PANTOPRAZOLE SODIUM 40 MG PO TBEC: 40.0000 mg | DELAYED_RELEASE_TABLET | Freq: Every day | ORAL | 2 refills | Status: DC

## 2018-08-30 MED ORDER — AMLODIPINE BESY-BENAZEPRIL HCL 5-20 MG PO CAPS: 1.0000 | ORAL_CAPSULE | Freq: Every day | ORAL | 1 refills | Status: DC

## 2018-08-30 MED ORDER — PRAVASTATIN SODIUM 20 MG PO TABS: 20.0000 mg | ORAL_TABLET | Freq: Every day | ORAL | 1 refills | Status: DC

## 2018-08-30 NOTE — Progress Notes (Signed)
Established Patient Office Visit  Subjective:  Patient ID: [place], female    DOB: [birth year]  Age: 68 y.o. MRN: [id number]  CC:  Chief Complaint  Patient presents with  . Follow-up    HPI Julie Dougherty presents for follow-up of her hypertension and elevated cholesterol.  Reports compliance with both medicines without issue.  She is fasting today.  Continues to take pantoprazole daily for GERD symptoms.  She had a brief episode of palpitations without chest pain shortness of breath.  Has never had issues with exertional chest pain or dyspnea.  Recently tested negative for the COVID virus.  Past Medical History:  Diagnosis Date  . Arthritis   . Cataract   . Chronic constipation   . Diverticulitis   . Diverticulosis   . Fatty liver   . GERD (gastroesophageal reflux disease)   . Hemorrhoids, internal, with bleeding Gr 2 prolapsed 06/30/2011   Found on colonoscopy 2011 and anoscopy May 2013   . Hepatitis    "B"    no rx >10 yrs  . Hiatal hernia   . High cholesterol   . Hypertension   . Internal hemorrhoids   . OSA (obstructive sleep apnea)   . Pre-diabetes   . PUD (peptic ulcer disease)   . Rectal ulceration 04/27/2009   Associated reactive/regenerative changes and fibromuscular extensions into lamina propria/Mucosal Prolapse  . Sleep apnea    Waiting on CPAP machine    Past Surgical History:  Procedure Laterality Date  . ABDOMINAL HYSTERECTOMY    . CATARACT EXTRACTION Right 03/2018  . COLONOSCOPY  04/27/09    POLYPOID FRAGMENT OF COLONIC MUCOSA WITH SURFACE  . HEMORRHOID BANDING    . TOTAL KNEE ARTHROPLASTY Left 08/29/2016   Procedure: LEFT TOTAL KNEE ARTHROPLASTY;  Surgeon: Mcarthur Rossetti, MD;  Location: [facility];  Service: Orthopedics;  Laterality: Left;  . TOTAL KNEE ARTHROPLASTY Right 03/06/2017   Procedure: RIGHT TOTAL KNEE ARTHROPLASTY;  Surgeon: Mcarthur Rossetti, MD;  Location: [facility];  Service: Orthopedics;  Laterality:  Right;  . TUBAL LIGATION      Family History  Problem Relation Age of Onset  . Diabetes Sister   . Asthma Sister   . Hyperlipidemia Sister   . Hypertension Sister   . Colon cancer Father 78  . Cancer Father   . Arthritis Mother   . Hypertension Mother   . Depression Sister   . Mental illness Sister   . Esophageal cancer Neg Hx   . Pancreatic cancer Neg Hx   . Stomach cancer Neg Hx     Social History   Socioeconomic History  . Marital status: Single    Spouse name: Not on file  . Number of children: 3  . Years of education: Not on file  . Highest education level: Not on file  Occupational History  . Not on file  Social Needs  . Financial resource strain: Not on file  . Food insecurity    Worry: Not on file    Inability: Not on file  . Transportation needs    Medical: Not on file    Non-medical: Not on file  Tobacco Use  . Smoking status: Former Research scientist (life sciences)  . Smokeless tobacco: Never Used  . Tobacco comment: "when she was young"  Substance and Sexual Activity  . Alcohol use: No    Comment: seldom  . Drug use: No  . Sexual activity: Not on file  Lifestyle  . Physical activity  Days per week: Not on file    Minutes per session: Not on file  . Stress: Not on file  Relationships  . Social Herbalist on phone: Not on file    Gets together: Not on file    Attends religious service: Not on file    Active member of club or organization: Not on file    Attends meetings of clubs or organizations: Not on file    Relationship status: Not on file  . Intimate partner violence    Fear of current or ex partner: Not on file    Emotionally abused: Not on file    Physically abused: Not on file    Forced sexual activity: Not on file  Other Topics Concern  . Not on file  Social History Narrative  . Not on file    Outpatient Medications Prior to Visit  Medication Sig Dispense Refill  . aspirin EC 81 MG tablet Take 81 mg by mouth daily.    . cyclobenzaprine  (FLEXERIL) 5 MG tablet Take 1 tablet (5 mg total) by mouth 3 (three) times daily as needed for muscle spasms. 30 tablet 0  . omega-3 fish oil (MAXEPA) 1000 MG CAPS capsule Take 1 mg by mouth.    . polyethylene glycol powder (MIRALAX) powder Take 17 g by mouth daily. (Patient taking differently: Take 17 g by mouth daily as needed for moderate constipation. ) 255 g 11  . amLODipine-benazepril (LOTREL) 5-20 MG capsule Take 1 capsule by mouth daily. 90 capsule 1  . pantoprazole (PROTONIX) 40 MG tablet Take 1 tablet (40 mg total) by mouth daily. 90 tablet 2  . pravastatin (PRAVACHOL) 20 MG tablet Take 1 tablet (20 mg total) by mouth daily. 90 tablet 1   No facility-administered medications prior to visit.     Allergies  Allergen Reactions  . Ciprofloxacin Hives and Nausea Only  . Flagyl [Metronidazole] Hives and Nausea Only    ROS Review of Systems  Constitutional: Negative.   HENT: Negative.   Eyes: Negative for photophobia and visual disturbance.  Respiratory: Negative.  Negative for shortness of breath and wheezing.   Cardiovascular: Negative for chest pain.  Gastrointestinal: Negative.   Endocrine: Negative for polyphagia and polyuria.  Genitourinary: Negative.   Skin: Negative for pallor and rash.  Allergic/Immunologic: Negative for immunocompromised state.  Neurological: Negative for light-headedness and numbness.  Hematological: Does not bruise/bleed easily.  Psychiatric/Behavioral: Negative.       Objective:    Physical Exam  Constitutional: She is oriented to person, place, and time. She appears well-developed and well-nourished. No distress.  HENT:  Head: Normocephalic and atraumatic.  Right Ear: External ear normal.  Left Ear: External ear normal.  Mouth/Throat: Oropharynx is clear and moist. No oropharyngeal exudate.  Eyes: Pupils are equal, round, and reactive to light. Conjunctivae are normal. Right eye exhibits no discharge. Left eye exhibits no discharge. No  scleral icterus.  Neck: No JVD present. No tracheal deviation present.  Cardiovascular: Normal rate, regular rhythm and normal heart sounds.  Pulmonary/Chest: Effort normal and breath sounds normal. No stridor.  Musculoskeletal:        General: No edema.  Neurological: She is alert and oriented to person, place, and time.  Skin: Skin is warm and dry. She is not diaphoretic.  Psychiatric: She has a normal mood and affect. Her behavior is normal.    BP 122/78   Pulse 100   Ht 5' (1.524 m)   Wt 199  lb 6 oz (90.4 kg)   SpO2 97%   BMI 38.94 kg/m  Wt Readings from Last 3 Encounters:  08/30/18 199 lb 6 oz (90.4 kg)  07/16/18 200 lb 6 oz (90.9 kg)  03/26/18 197 lb 8 oz (89.6 kg)   BP Readings from Last 3 Encounters:  08/30/18 122/78  07/16/18 118/84  03/26/18 120/74   Guideline developer:  UpToDate (see UpToDate for funding source) Date Released: June 2014  Health Maintenance Due  Topic Date Due  . Samul Dada  03/31/1970  . DEXA SCAN  03/30/2016  . PNA vac Low Risk Adult (1 of 2 - PCV13) 03/30/2016    There are no preventive care reminders to display for this patient.  Lab Results  Component Value Date   TSH 1.95 01/02/2018   Lab Results  Component Value Date   WBC 6.6 07/16/2018   HGB 13.8 07/16/2018   HCT 42.1 07/16/2018   MCV 88.7 07/16/2018   PLT 251.0 07/16/2018   Lab Results  Component Value Date   NA 137 07/16/2018   K 5.0 07/16/2018   CO2 26 07/16/2018   GLUCOSE 101 (H) 07/16/2018   BUN 15 07/16/2018   CREATININE 0.97 07/16/2018   BILITOT 0.4 07/16/2018   ALKPHOS 70 07/16/2018   AST 14 07/16/2018   ALT 15 07/16/2018   PROT 7.6 07/16/2018   ALBUMIN 4.2 07/16/2018   CALCIUM 9.6 07/16/2018   ANIONGAP 14 03/07/2017   GFR 57.23 (L) 07/16/2018   Lab Results  Component Value Date   CHOL 205 (H) 03/26/2018   Lab Results  Component Value Date   HDL 58.50 03/26/2018   Lab Results  Component Value Date   LDLCALC 89 01/02/2018   Lab Results   Component Value Date   TRIG 203.0 (H) 03/26/2018   Lab Results  Component Value Date   CHOLHDL 4 03/26/2018   Lab Results  Component Value Date   HGBA1C 5.7 01/02/2018      Assessment & Plan:   Problem List Items Addressed This Visit      Cardiovascular and Mediastinum   Essential hypertension - Primary   Relevant Medications   amLODipine-benazepril (LOTREL) 5-20 MG capsule   pravastatin (PRAVACHOL) 20 MG tablet   Other Relevant Orders   Basic metabolic panel   Urinalysis, Routine w reflex microscopic     Digestive   GERD   Relevant Medications   pantoprazole (PROTONIX) 40 MG tablet     Other   Elevated cholesterol   Relevant Medications   amLODipine-benazepril (LOTREL) 5-20 MG capsule   pravastatin (PRAVACHOL) 20 MG tablet   Other Relevant Orders   LDL cholesterol, direct   Basic metabolic panel   Lipid panel      Meds ordered this encounter  Medications  . amLODipine-benazepril (LOTREL) 5-20 MG capsule    Sig: Take 1 capsule by mouth daily.    Dispense:  90 capsule    Refill:  1  . pravastatin (PRAVACHOL) 20 MG tablet    Sig: Take 1 tablet (20 mg total) by mouth daily.    Dispense:  90 tablet    Refill:  1  . pantoprazole (PROTONIX) 40 MG tablet    Sig: Take 1 tablet (40 mg total) by mouth daily.    Dispense:  90 tablet    Refill:  2    Follow-up: Return in about 6 months (around 03/02/2019).

## 2018-09-12 ENCOUNTER — Telehealth: Payer: Self-pay | Admitting: Pulmonary Disease

## 2018-09-13 NOTE — Telephone Encounter (Signed)
lvm

## 2018-09-24 ENCOUNTER — Ambulatory Visit: Payer: Medicare Other | Admitting: Family Medicine

## 2018-10-09 ENCOUNTER — Other Ambulatory Visit: Payer: Self-pay

## 2018-10-09 DIAGNOSIS — Z20822 Contact with and (suspected) exposure to covid-19: Secondary | ICD-10-CM

## 2018-10-09 DIAGNOSIS — R6889 Other general symptoms and signs: Secondary | ICD-10-CM | POA: Diagnosis not present

## 2018-10-11 LAB — NOVEL CORONAVIRUS, NAA: SARS-CoV-2, NAA: NOT DETECTED

## 2018-11-08 ENCOUNTER — Other Ambulatory Visit: Payer: Self-pay

## 2018-11-08 DIAGNOSIS — Z20822 Contact with and (suspected) exposure to covid-19: Secondary | ICD-10-CM

## 2018-11-09 LAB — NOVEL CORONAVIRUS, NAA: SARS-CoV-2, NAA: NOT DETECTED

## 2018-12-18 ENCOUNTER — Other Ambulatory Visit: Payer: Self-pay

## 2018-12-18 DIAGNOSIS — Z20822 Contact with and (suspected) exposure to covid-19: Secondary | ICD-10-CM

## 2018-12-20 LAB — NOVEL CORONAVIRUS, NAA: SARS-CoV-2, NAA: NOT DETECTED

## 2019-01-27 ENCOUNTER — Ambulatory Visit: Payer: Medicare Other | Attending: Internal Medicine

## 2019-01-27 DIAGNOSIS — Z20822 Contact with and (suspected) exposure to covid-19: Secondary | ICD-10-CM

## 2019-01-29 LAB — NOVEL CORONAVIRUS, NAA: SARS-CoV-2, NAA: DETECTED — AB

## 2019-01-30 ENCOUNTER — Telehealth: Payer: Self-pay | Admitting: Nurse Practitioner

## 2019-01-30 NOTE — Telephone Encounter (Signed)
Called using spanish interpreter to Discuss with patient about Covid symptoms and the use of bamlanivimab, a monoclonal antibody infusion for those with mild to moderate Covid symptoms and at a high risk of hospitalization.     Pt is qualified for this infusion at the [facility] infusion center due to co-morbid conditions and/or a member of an at-risk group.    Patient Active Problem List   Diagnosis Date Noted  . Sleep apnea 03/26/2018  . History of hepatitis B 03/26/2018  . Healthcare maintenance 03/26/2018  . 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 Status post total knee replacement, left 08/29/2016  . Baker's cyst of knee, left - suspected 05/30/2016  . Diverticulitis of colon 03/17/2016  . Hemorrhoids, internal, with bleeding Gr 2 prolapsed 06/30/2011  . Diverticulitis 06/06/2011  . Rectal bleeding 04/19/2009  . ABDOMINAL PAIN, RIGHT LOWER QUADRANT 04/19/2009  . Elevated cholesterol 07/11/2006  . Essential hypertension 07/11/2006  . GERD 07/11/2006  . HEPATITIS B, HX OF 07/11/2006      Unable to reach pt

## 2019-01-31 ENCOUNTER — Encounter: Payer: Self-pay | Admitting: Family Medicine

## 2019-02-06 ENCOUNTER — Ambulatory Visit: Payer: Medicare Other | Attending: Internal Medicine

## 2019-02-06 DIAGNOSIS — Z20822 Contact with and (suspected) exposure to covid-19: Secondary | ICD-10-CM

## 2019-02-09 LAB — NOVEL CORONAVIRUS, NAA: SARS-CoV-2, NAA: NOT DETECTED

## 2019-02-26 ENCOUNTER — Ambulatory Visit (HOSPITAL_COMMUNITY)
Admission: EM | Admit: 2019-02-26 | Discharge: 2019-02-26 | Disposition: A | Discharge status: Home / Self Care | Payer: Medicare Other | Attending: Family Medicine | Admitting: Family Medicine

## 2019-02-26 ENCOUNTER — Other Ambulatory Visit: Payer: Self-pay

## 2019-02-26 ENCOUNTER — Encounter (HOSPITAL_COMMUNITY): Payer: Self-pay

## 2019-02-26 ENCOUNTER — Telehealth: Payer: Medicare Other | Admitting: Family Medicine

## 2019-02-26 DIAGNOSIS — R1030 Lower abdominal pain, unspecified: Secondary | ICD-10-CM | POA: Diagnosis not present

## 2019-02-26 DIAGNOSIS — K921 Melena: Secondary | ICD-10-CM

## 2019-02-26 DIAGNOSIS — Z8719 Personal history of other diseases of the digestive system: Secondary | ICD-10-CM

## 2019-02-26 DIAGNOSIS — K644 Residual hemorrhoidal skin tags: Secondary | ICD-10-CM

## 2019-02-26 LAB — POCT URINALYSIS DIP (DEVICE)
Bilirubin Urine: NEGATIVE
Glucose, UA: NEGATIVE mg/dL
Hgb urine dipstick: NEGATIVE
Ketones, ur: NEGATIVE mg/dL
Nitrite: NEGATIVE
Protein, ur: NEGATIVE mg/dL
Specific Gravity, Urine: 1.015 (ref 1.005–1.030)
Urobilinogen, UA: 0.2 mg/dL (ref 0.0–1.0)
pH: 7 (ref 5.0–8.0)

## 2019-02-26 MED ORDER — HYDROCORTISONE ACETATE 25 MG RE SUPP: 25.0000 mg | Freq: Two times a day (BID) | RECTAL | 0 refills | Status: DC

## 2019-02-26 MED ORDER — AMOXICILLIN-POT CLAVULANATE 875-125 MG PO TABS: 1.0000 | ORAL_TABLET | Freq: Two times a day (BID) | ORAL | 0 refills | Status: DC

## 2019-02-26 NOTE — ED Triage Notes (Signed)
Pt states she has abdominal pain when voiding. X 5 day. Pt has pressure when voiding.pt states she has been taking Ibuprofen for the pain.

## 2019-02-26 NOTE — ED Provider Notes (Signed)
[place]   MRN: [id number] DOB: 1951/01/26   Subjective:   [place] is a 69 y.o. female presenting for 5-day history of progressively worsening and persistent lower abdominal pain, urinary pressure.  Patient has also helped perianal pain, has had bloody stools.  Admits that she has painful defecation in her abdomen has had to strain.  She has a history of diverticulitis and internal and external hemorrhoids.  Patient states that she has previously had ligand therapy which failed.  No current facility-administered medications for this encounter.  Current Outpatient Medications:  .  amLODipine-benazepril (LOTREL) 5-20 MG capsule, Take 1 capsule by mouth daily., Disp: 90 capsule, Rfl: 1 .  aspirin EC 81 MG tablet, Take 81 mg by mouth daily., Disp: , Rfl:  .  cyclobenzaprine (FLEXERIL) 5 MG tablet, Take 1 tablet (5 mg total) by mouth 3 (three) times daily as needed for muscle spasms., Disp: 30 tablet, Rfl: 0 .  omega-3 fish oil (MAXEPA) 1000 MG CAPS capsule, Take 1 mg by mouth., Disp: , Rfl:  .  pantoprazole (PROTONIX) 40 MG tablet, Take 1 tablet (40 mg total) by mouth daily., Disp: 90 tablet, Rfl: 2 .  polyethylene glycol powder (MIRALAX) powder, Take 17 g by mouth daily. (Patient taking differently: Take 17 g by mouth daily as needed for moderate constipation. ), Disp: 255 g, Rfl: 11 .  pravastatin (PRAVACHOL) 20 MG tablet, Take 1 tablet (20 mg total) by mouth daily., Disp: 90 tablet, Rfl: 1   Allergies  Allergen Reactions  . Ciprofloxacin Hives and Nausea Only  . Flagyl [Metronidazole] Hives and Nausea Only    Past Medical History:  Diagnosis Date  . Arthritis   . Cataract   . Chronic constipation   . Diverticulitis   . Diverticulosis   . Fatty liver   . GERD (gastroesophageal reflux disease)   . Hemorrhoids, internal, with bleeding Gr 2 prolapsed 06/30/2011   Found on colonoscopy 2011 and anoscopy May 2013   . Hepatitis    "B"    no rx >10 yrs  .  Hiatal hernia   . High cholesterol   . Hypertension   . Internal hemorrhoids   . OSA (obstructive sleep apnea)   . Pre-diabetes   . PUD (peptic ulcer disease)   . Rectal ulceration 04/27/2009   Associated reactive/regenerative changes and fibromuscular extensions into lamina propria/Mucosal Prolapse  . Sleep apnea    Waiting on CPAP machine     Past Surgical History:  Procedure Laterality Date  . ABDOMINAL HYSTERECTOMY    . CATARACT EXTRACTION Right 03/2018  . COLONOSCOPY  04/27/09    POLYPOID FRAGMENT OF COLONIC MUCOSA WITH SURFACE  . HEMORRHOID BANDING    . TOTAL KNEE ARTHROPLASTY Left 08/29/2016   Procedure: LEFT TOTAL KNEE ARTHROPLASTY;  Surgeon: Mcarthur Rossetti, MD;  Location: [facility];  Service: Orthopedics;  Laterality: Left;  . TOTAL KNEE ARTHROPLASTY Right 03/06/2017   Procedure: RIGHT TOTAL KNEE ARTHROPLASTY;  Surgeon: Mcarthur Rossetti, MD;  Location: [place];  Service: Orthopedics;  Laterality: Right;  . TUBAL LIGATION      Family History  Problem Relation Age of Onset  . Diabetes Sister   . Asthma Sister   . Hyperlipidemia Sister   . Hypertension Sister   . Colon cancer Father 68  . Cancer Father   . Arthritis Mother   . Hypertension Mother   . Depression Sister   . Mental illness Sister   . Esophageal cancer Neg Hx   .  Pancreatic cancer Neg Hx   . Stomach cancer Neg Hx     Social History   Tobacco Use  . Smoking status: Former Research scientist (life sciences)  . Smokeless tobacco: Never Used  . Tobacco comment: "when she was young"  Substance Use Topics  . Alcohol use: No    Comment: seldom  . Drug use: No    ROS Denies fever, nausea, vomiting, urinary frequency, dysuria.  Objective:   Vitals: BP 125/80 (BP Location: Right Arm)   Pulse 97   Temp 98.9 F (37.2 C) (Oral)   Resp 18   Wt 195 lb (88.5 kg)   SpO2 100%   BMI 38.08 kg/m   Physical Exam Constitutional:      General: She is not in acute distress.    Appearance: Normal appearance. She is  well-developed and normal weight. She is not ill-appearing, toxic-appearing or diaphoretic.  HENT:     Head: Normocephalic and atraumatic.     Right Ear: External ear normal.     Left Ear: External ear normal.     Nose: Nose normal.     Mouth/Throat:     Mouth: Mucous membranes are moist.     Pharynx: Oropharynx is clear.  Eyes:     General: No scleral icterus.    Extraocular Movements: Extraocular movements intact.     Pupils: Pupils are equal, round, and reactive to light.  Cardiovascular:     Rate and Rhythm: Normal rate and regular rhythm.     Pulses: Normal pulses.     Heart sounds: Normal heart sounds. No murmur. No friction rub. No gallop.   Pulmonary:     Effort: Pulmonary effort is normal. No respiratory distress.     Breath sounds: Normal breath sounds. No stridor. No wheezing, rhonchi or rales.  Abdominal:     General: Bowel sounds are normal. There is no distension.     Palpations: Abdomen is soft. There is no mass.     Tenderness: There is generalized abdominal tenderness and tenderness in the right lower quadrant, suprapubic area and left lower quadrant. There is no right CVA tenderness, left CVA tenderness, guarding or rebound.  Genitourinary:   Skin:    General: Skin is warm and dry.     Coloration: Skin is not pale.     Findings: No rash.  Neurological:     General: No focal deficit present.     Mental Status: She is alert and oriented to person, place, and time.  Psychiatric:        Mood and Affect: Mood normal.        Behavior: Behavior normal.        Thought Content: Thought content normal.        Judgment: Judgment normal.     Results for orders placed or performed during the hospital encounter of 02/26/19 (from the past 24 hour(s))  POCT urinalysis dip (device)     Status: Abnormal   Collection Time: 02/26/19  9:35 AM  Result Value Ref Range   Glucose, UA NEGATIVE NEGATIVE mg/dL   Bilirubin Urine NEGATIVE NEGATIVE   Ketones, ur NEGATIVE NEGATIVE  mg/dL   Specific Gravity, Urine 1.015 1.005 - 1.030   Hgb urine dipstick NEGATIVE NEGATIVE   pH 7.0 5.0 - 8.0   Protein, ur NEGATIVE NEGATIVE mg/dL   Urobilinogen, UA 0.2 0.0 - 1.0 mg/dL   Nitrite NEGATIVE NEGATIVE   Leukocytes,Ua SMALL (A) NEGATIVE    Assessment and Plan :   1. Lower abdominal  pain   2. Bloody stools   3. History of diverticulitis   4. History of hemorrhoids   5. External hemorrhoid     Patient has reassuring vital signs, not appropriate for emergency room currently.  She has obvious large external hemorrhoid and will redirect her to [facility] surgery for consult and procedural removal.  In the meantime she is to use suppositories.  Given her history of diverticulitis and lower abdominal pain, will also cover for this with Augmentin.  Strict ER precautions. Counseled patient on potential for adverse effects with medications prescribed today, patient verbalized understanding.    Jaynee Eagles, PA-C 02/26/19 [id number]

## 2019-02-27 ENCOUNTER — Ambulatory Visit: Payer: Self-pay | Admitting: Surgery

## 2019-02-27 DIAGNOSIS — K643 Fourth degree hemorrhoids: Secondary | ICD-10-CM | POA: Diagnosis not present

## 2019-03-09 ENCOUNTER — Encounter (HOSPITAL_BASED_OUTPATIENT_CLINIC_OR_DEPARTMENT_OTHER): Payer: Self-pay | Admitting: Surgery

## 2019-03-09 NOTE — H&P (View-Only) (Signed)
General Surgery [facility]  Julie Dougherty DOB: [birth year] Widowed / Language: Undefined / Race: White Female   History of Present Illness   The patient is a 69 year old female who presents with hemorrhoids.  CHIEF COMPLAINT: prolapsed internal hemorrhoid  Patient is referred by Jaynee Eagles, PA-C, at [facility] for surgical evaluation and management of prolapsed internal hemorrhoids. Patient is accompanied by her son who acts as a Optometrist. Patient has had long-standing problems with internal and external hemorrhoids. She has previously been treated with rubber band ligation by Dr. Silvano Rusk, her gastroenterologist. She has had no other rectal surgery. Patient is noted a protruding mass from the anus for a few months. This has gradually become larger. She has intermittent episodes of bleeding. She has discomfort. She presents today for evaluation.  Past Surgical History Cataract Surgery  Bilateral. Cesarean Section - 1  Colon Polyp Removal - Colonoscopy  Hysterectomy (not due to cancer) - Partial  Knee Surgery  Bilateral.  Diagnostic Studies History  Colonoscopy  within last year Mammogram  1-3 years ago Pap Smear  1-5 years ago  Allergies  Cipro *FLUOROQUINOLONES*  Flagyl *ANTI-INFECTIVE AGENTS - MISC.*  Allergies Reconciled   Medication History amLODIPine Besy-Benazepril HCl (10-20MG  Capsule, Oral) Active. Aspirin (81MG  Tablet, Oral) Active. Cyclobenzaprine HCl (5MG  Tablet, Oral) Active. Omega 3 (1000MG  Capsule, Oral) Active. Pantoprazole Sodium (40MG  Tablet DR, Oral) Active. Pravastatin Sodium (20MG  Tablet, Oral) Active. Medications Reconciled  Social History  Alcohol use  Occasional alcohol use. Caffeine use  Coffee. No drug use  Tobacco use  Never smoker.  Family History  Arthritis  Mother. Family history unknown  First Degree Relatives  Hypertension  Mother. Prostate Cancer   Father.  Pregnancy / Birth History  Age of menopause  16-50 Gravida  3 Irregular periods  Length (months) of breastfeeding  7-12 Maternal age  71-20 Para  3  Other Problems  Diverticulosis  Gastroesophageal Reflux Disease  Hemorrhoids  High blood pressure  Hypercholesterolemia  Sleep Apnea   Review of Systems  General Not Present- Appetite Loss, Chills, Fatigue, Fever, Night Sweats, Weight Gain and Weight Loss. Skin Not Present- Change in Wart/Mole, Dryness, Hives, Jaundice, New Lesions, Non-Healing Wounds, Rash and Ulcer. HEENT Not Present- Earache, Hearing Loss, Hoarseness, Nose Bleed, Oral Ulcers, Ringing in the Ears, Seasonal Allergies, Sinus Pain, Sore Throat, Visual Disturbances, Wears glasses/contact lenses and Yellow Eyes. Respiratory Not Present- Bloody sputum, Chronic Cough, Difficulty Breathing, Snoring and Wheezing. Breast Not Present- Breast Mass, Breast Pain, Nipple Discharge and Skin Changes. Cardiovascular Not Present- Chest Pain, Difficulty Breathing Lying Down, Leg Cramps, Palpitations, Rapid Heart Rate, Shortness of Breath and Swelling of Extremities. Gastrointestinal Present- Hemorrhoids. Not Present- Abdominal Pain, Bloating, Bloody Stool, Change in Bowel Habits, Chronic diarrhea, Constipation, Difficulty Swallowing, Excessive gas, Gets full quickly at meals, Indigestion, Nausea, Rectal Pain and Vomiting. Female Genitourinary Not Present- Frequency, Nocturia, Painful Urination, Pelvic Pain and Urgency. Musculoskeletal Not Present- Back Pain, Joint Pain, Joint Stiffness, Muscle Pain, Muscle Weakness and Swelling of Extremities. Neurological Not Present- Decreased Memory, Fainting, Headaches, Numbness, Seizures, Tingling, Tremor, Trouble walking and Weakness. Psychiatric Not Present- Anxiety, Bipolar, Change in Sleep Pattern, Depression, Fearful and Frequent crying. Endocrine Not Present- Cold Intolerance, Excessive Hunger, Hair Changes, Heat Intolerance,  Hot flashes and New Diabetes. Hematology Not Present- Blood Thinners, Easy Bruising, Excessive bleeding, Gland problems, HIV and Persistent Infections.  Vitals Weight: 199.6 lb Height: 61in Body Surface Area: 1.89 m Body Mass Index: 37.71  kg/m  Temp.: 97.40F  Pulse: 98 (Regular)  BP: 136/86 (Sitting, Left Arm, Standard)   Physical Exam  GENERAL APPEARANCE Development: normal Nutritional status: normal Gross deformities: none  SKIN Rash, lesions, ulcers: none Induration, erythema: none Nodules: none palpable  EYES Conjunctiva and lids: normal Pupils: equal and reactive Iris: normal bilaterally  EARS, NOSE, MOUTH, THROAT External ears: no lesion or deformity External nose: no lesion or deformity Hearing: grossly normal Patient is wearing a mask.  NECK Symmetric: yes Trachea: midline Thyroid: no palpable nodules in the thyroid bed  CHEST Respiratory effort: normal Retraction or accessory muscle use: no Breath sounds: normal bilaterally Rales, rhonchi, wheeze: none  CARDIOVASCULAR Auscultation: regular rhythm, normal rate Murmurs: none Pulses: carotid and radial pulse 2+ palpable Lower extremity edema: none Lower extremity varicosities: none  RECTAL External examination shows a prolapsed internal hemorrhoid in the right anterior position. There is mild superficial ulceration. There is mild tenderness to palpation. There is no sign of thrombosis. Digital rectal exam shows normal tone with mild discomfort in the right anterior column. There is no sign of fissure or fistula.  Anoscopy is performed. There is normal lower rectal mucosa. There is no internal sign of fistula. This appears to be a single column of disease in the right anterior column as the remainder of the anal canal appears grossly normal.  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 Patient is provided with written literature on hemorrhoid and hemorrhoid surgery to review at home. I reviewed the illustrations in the materials with the patient and her son regarding prolapsed internal hemorrhoids and surgery for hemorrhoidectomy.  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 I have recommended open hemorrhoidectomy as an outpatient surgical procedure. We discussed the surgery. We discussed the postoperative recovery. They understand and agree to proceed in the near future.  The risks and benefits of the procedure have been discussed at length with the patient. The patient understands the proposed procedure, potential alternative treatments, and the course of recovery to be expected. All of the patient's questions have been answered at this time. The patient wishes to proceed with surgery.   Armandina Gemma, MD [facility] Office: [phone]

## 2019-03-09 NOTE — Consult Note (Signed)
General Surgery [facility]  [place] DOB: 21-Jun-1950 Widowed / Language: Undefined / Race: White Female   History of Present Illness   The patient is a 69 year old female who presents with hemorrhoids.  CHIEF COMPLAINT: prolapsed internal hemorrhoid  Patient is referred by Jaynee Eagles, PA-C, at [facility] for surgical evaluation and management of prolapsed internal hemorrhoids. Patient is accompanied by her son who acts as a Optometrist. Patient has had long-standing problems with internal and external hemorrhoids. She has previously been treated with rubber band ligation by Dr. Silvano Rusk, her gastroenterologist. She has had no other rectal surgery. Patient is noted a protruding mass from the anus for a few months. This has gradually become larger. She has intermittent episodes of bleeding. She has discomfort. She presents today for evaluation.  Past Surgical History Cataract Surgery  Bilateral. Cesarean Section - 1  Colon Polyp Removal - Colonoscopy  Hysterectomy (not due to cancer) - Partial  Knee Surgery  Bilateral.  Diagnostic Studies History  Colonoscopy  within last year Mammogram  1-3 years ago Pap Smear  1-5 years ago  Allergies  Cipro *FLUOROQUINOLONES*  Flagyl *ANTI-INFECTIVE AGENTS - MISC.*  Allergies Reconciled   Medication History amLODIPine Besy-Benazepril HCl (10-20MG  Capsule, Oral) Active. Aspirin (81MG  Tablet, Oral) Active. Cyclobenzaprine HCl (5MG  Tablet, Oral) Active. Omega 3 (1000MG  Capsule, Oral) Active. Pantoprazole Sodium (40MG  Tablet DR, Oral) Active. Pravastatin Sodium (20MG  Tablet, Oral) Active. Medications Reconciled  Social History  Alcohol use  Occasional alcohol use. Caffeine use  Coffee. No drug use  Tobacco use  Never smoker.  Family History  Arthritis  Mother. Family history unknown  First Degree Relatives  Hypertension  Mother. Prostate Cancer   Father.  Pregnancy / Birth History  Age of menopause  45-50 Gravida  3 Irregular periods  Length (months) of breastfeeding  7-12 Maternal age  2-20 Para  3  Other Problems  Diverticulosis  Gastroesophageal Reflux Disease  Hemorrhoids  High blood pressure  Hypercholesterolemia  Sleep Apnea   Review of Systems  General Not Present- Appetite Loss, Chills, Fatigue, Fever, Night Sweats, Weight Gain and Weight Loss. Skin Not Present- Change in Wart/Mole, Dryness, Hives, Jaundice, New Lesions, Non-Healing Wounds, Rash and Ulcer. HEENT Not Present- Earache, Hearing Loss, Hoarseness, Nose Bleed, Oral Ulcers, Ringing in the Ears, Seasonal Allergies, Sinus Pain, Sore Throat, Visual Disturbances, Wears glasses/contact lenses and Yellow Eyes. Respiratory Not Present- Bloody sputum, Chronic Cough, Difficulty Breathing, Snoring and Wheezing. Breast Not Present- Breast Mass, Breast Pain, Nipple Discharge and Skin Changes. Cardiovascular Not Present- Chest Pain, Difficulty Breathing Lying Down, Leg Cramps, Palpitations, Rapid Heart Rate, Shortness of Breath and Swelling of Extremities. Gastrointestinal Present- Hemorrhoids. Not Present- Abdominal Pain, Bloating, Bloody Stool, Change in Bowel Habits, Chronic diarrhea, Constipation, Difficulty Swallowing, Excessive gas, Gets full quickly at meals, Indigestion, Nausea, Rectal Pain and Vomiting. Female Genitourinary Not Present- Frequency, Nocturia, Painful Urination, Pelvic Pain and Urgency. Musculoskeletal Not Present- Back Pain, Joint Pain, Joint Stiffness, Muscle Pain, Muscle Weakness and Swelling of Extremities. Neurological Not Present- Decreased Memory, Fainting, Headaches, Numbness, Seizures, Tingling, Tremor, Trouble walking and Weakness. Psychiatric Not Present- Anxiety, Bipolar, Change in Sleep Pattern, Depression, Fearful and Frequent crying. Endocrine Not Present- Cold Intolerance, Excessive Hunger, Hair Changes, Heat Intolerance,  Hot flashes and New Diabetes. Hematology Not Present- Blood Thinners, Easy Bruising, Excessive bleeding, Gland problems, HIV and Persistent Infections.  Vitals Weight: 199.6 lb Height: 61in Body Surface Area: 1.89 m Body Mass Index: 37.71  kg/m  Temp.: 97.31F  Pulse: 98 (Regular)  BP: 136/86 (Sitting, Left Arm, Standard)   Physical Exam  GENERAL APPEARANCE Development: normal Nutritional status: normal Gross deformities: none  SKIN Rash, lesions, ulcers: none Induration, erythema: none Nodules: none palpable  EYES Conjunctiva and lids: normal Pupils: equal and reactive Iris: normal bilaterally  EARS, NOSE, MOUTH, THROAT External ears: no lesion or deformity External nose: no lesion or deformity Hearing: grossly normal Patient is wearing a mask.  NECK Symmetric: yes Trachea: midline Thyroid: no palpable nodules in the thyroid bed  CHEST Respiratory effort: normal Retraction or accessory muscle use: no Breath sounds: normal bilaterally Rales, rhonchi, wheeze: none  CARDIOVASCULAR Auscultation: regular rhythm, normal rate Murmurs: none Pulses: carotid and radial pulse 2+ palpable Lower extremity edema: none Lower extremity varicosities: none  RECTAL External examination shows a prolapsed internal hemorrhoid in the right anterior position. There is mild superficial ulceration. There is mild tenderness to palpation. There is no sign of thrombosis. Digital rectal exam shows normal tone with mild discomfort in the right anterior column. There is no sign of fissure or fistula.  Anoscopy is performed. There is normal lower rectal mucosa. There is no internal sign of fistula. This appears to be a single column of disease in the right anterior column as the remainder of the anal canal appears grossly normal.  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 Patient is provided with written literature on hemorrhoid and hemorrhoid surgery to review at home. I reviewed the illustrations in the materials with the patient and her son regarding prolapsed internal hemorrhoids and surgery for hemorrhoidectomy.  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 I have recommended open hemorrhoidectomy as an outpatient surgical procedure. We discussed the surgery. We discussed the postoperative recovery. They understand and agree to proceed in the near future.  The risks and benefits of the procedure have been discussed at length with the patient. The patient understands the proposed procedure, potential alternative treatments, and the course of recovery to be expected. All of the patient's questions have been answered at this time. The patient wishes to proceed with surgery.   Armandina Gemma, MD [facility] Office: [phone]

## 2019-03-12 DIAGNOSIS — Z1231 Encounter for screening mammogram for malignant neoplasm of breast: Secondary | ICD-10-CM | POA: Diagnosis not present

## 2019-03-12 LAB — HM MAMMOGRAPHY

## 2019-03-17 ENCOUNTER — Other Ambulatory Visit: Payer: Self-pay

## 2019-03-17 ENCOUNTER — Encounter (HOSPITAL_BASED_OUTPATIENT_CLINIC_OR_DEPARTMENT_OTHER)
Admission: RE | Admit: 2019-03-17 | Discharge: 2019-03-17 | Disposition: A | Discharge status: Home / Self Care | Payer: Medicare Other | Source: Ambulatory Visit | Attending: Surgery | Admitting: Surgery

## 2019-03-17 ENCOUNTER — Encounter (HOSPITAL_BASED_OUTPATIENT_CLINIC_OR_DEPARTMENT_OTHER): Payer: Self-pay | Admitting: Surgery

## 2019-03-17 DIAGNOSIS — Z01812 Encounter for preprocedural laboratory examination: Secondary | ICD-10-CM | POA: Diagnosis not present

## 2019-03-17 NOTE — Progress Notes (Signed)

## 2019-03-19 ENCOUNTER — Encounter (HOSPITAL_BASED_OUTPATIENT_CLINIC_OR_DEPARTMENT_OTHER): Payer: Self-pay | Admitting: Surgery

## 2019-03-19 IMAGING — DX DG KNEE 1-2V PORT*R*
2 series · 2 of 2 positions shown · non-contrast
Comparison: 02/02/2017 MRI of the right knee.

CLINICAL DATA: 66 y/o F; status post total right knee arthroplasty.

EXAM:
PORTABLE RIGHT KNEE - 1-2 VIEW

[knee ap]
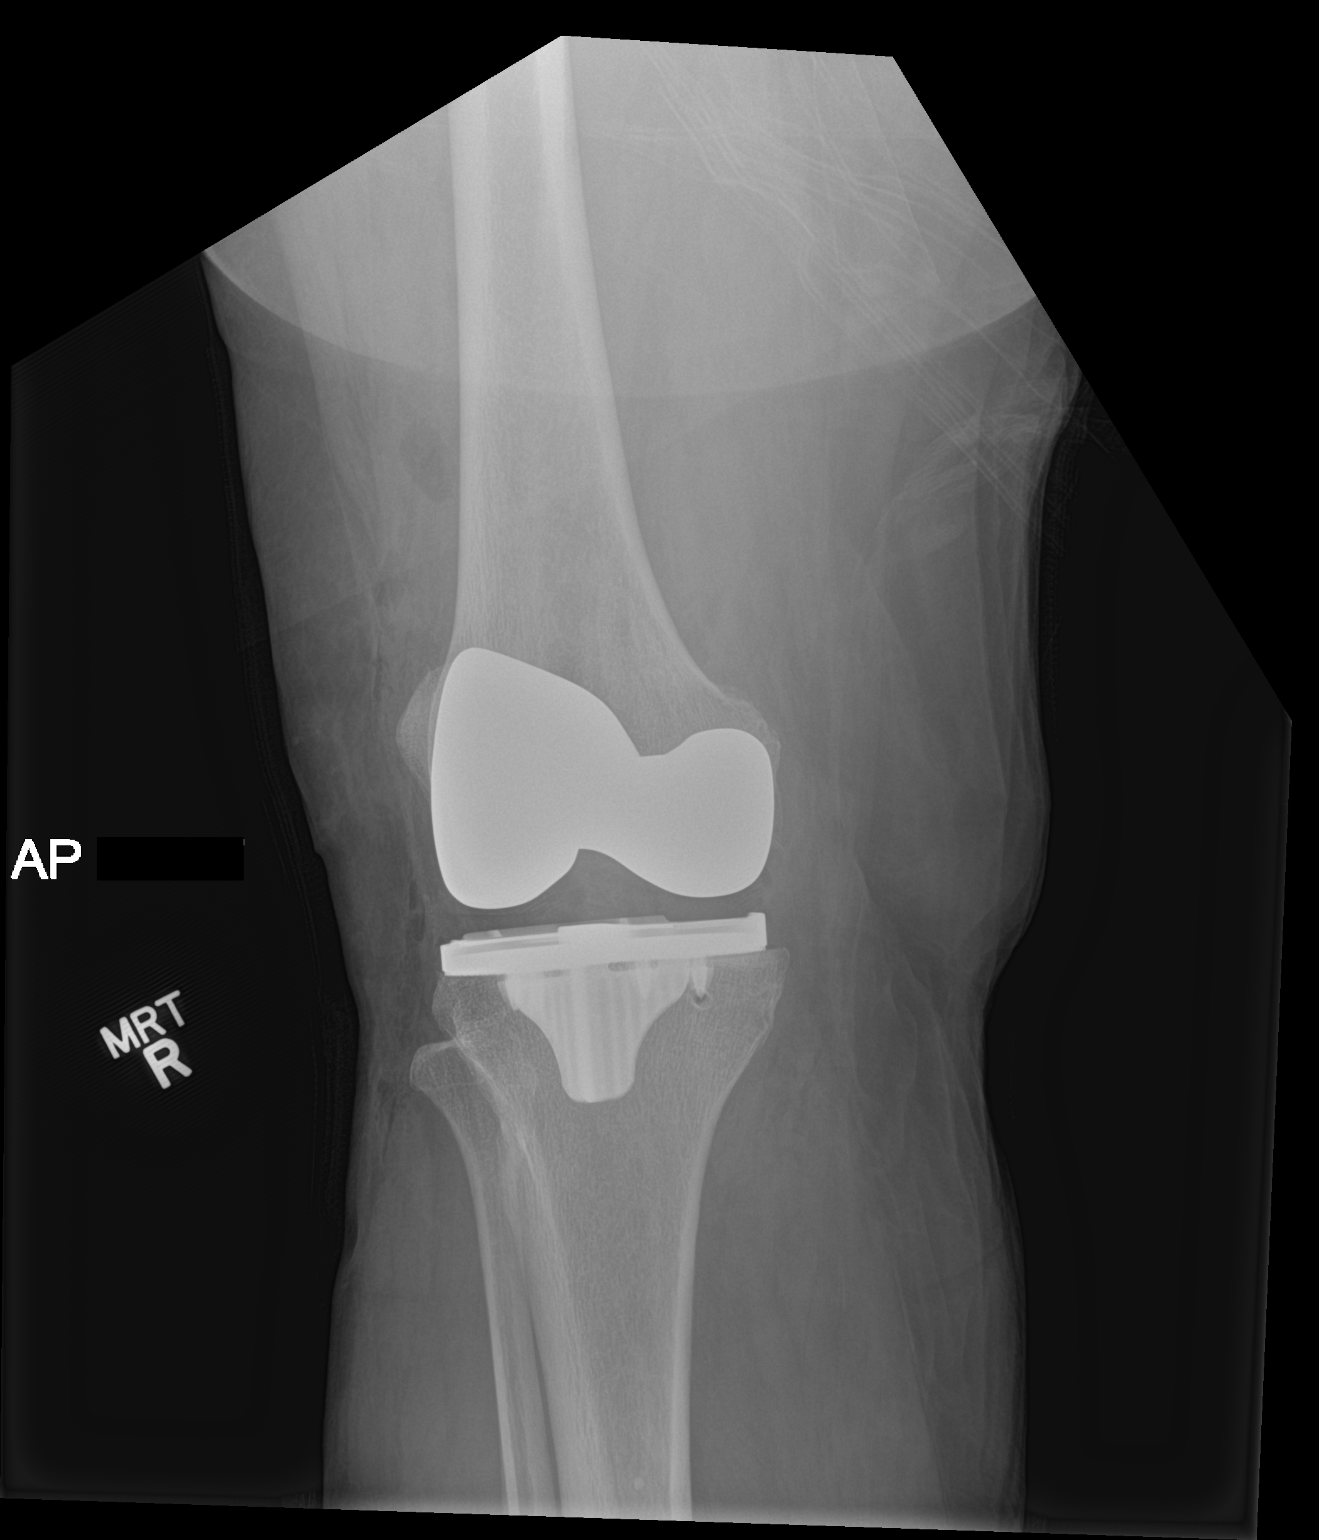

[knee lat]
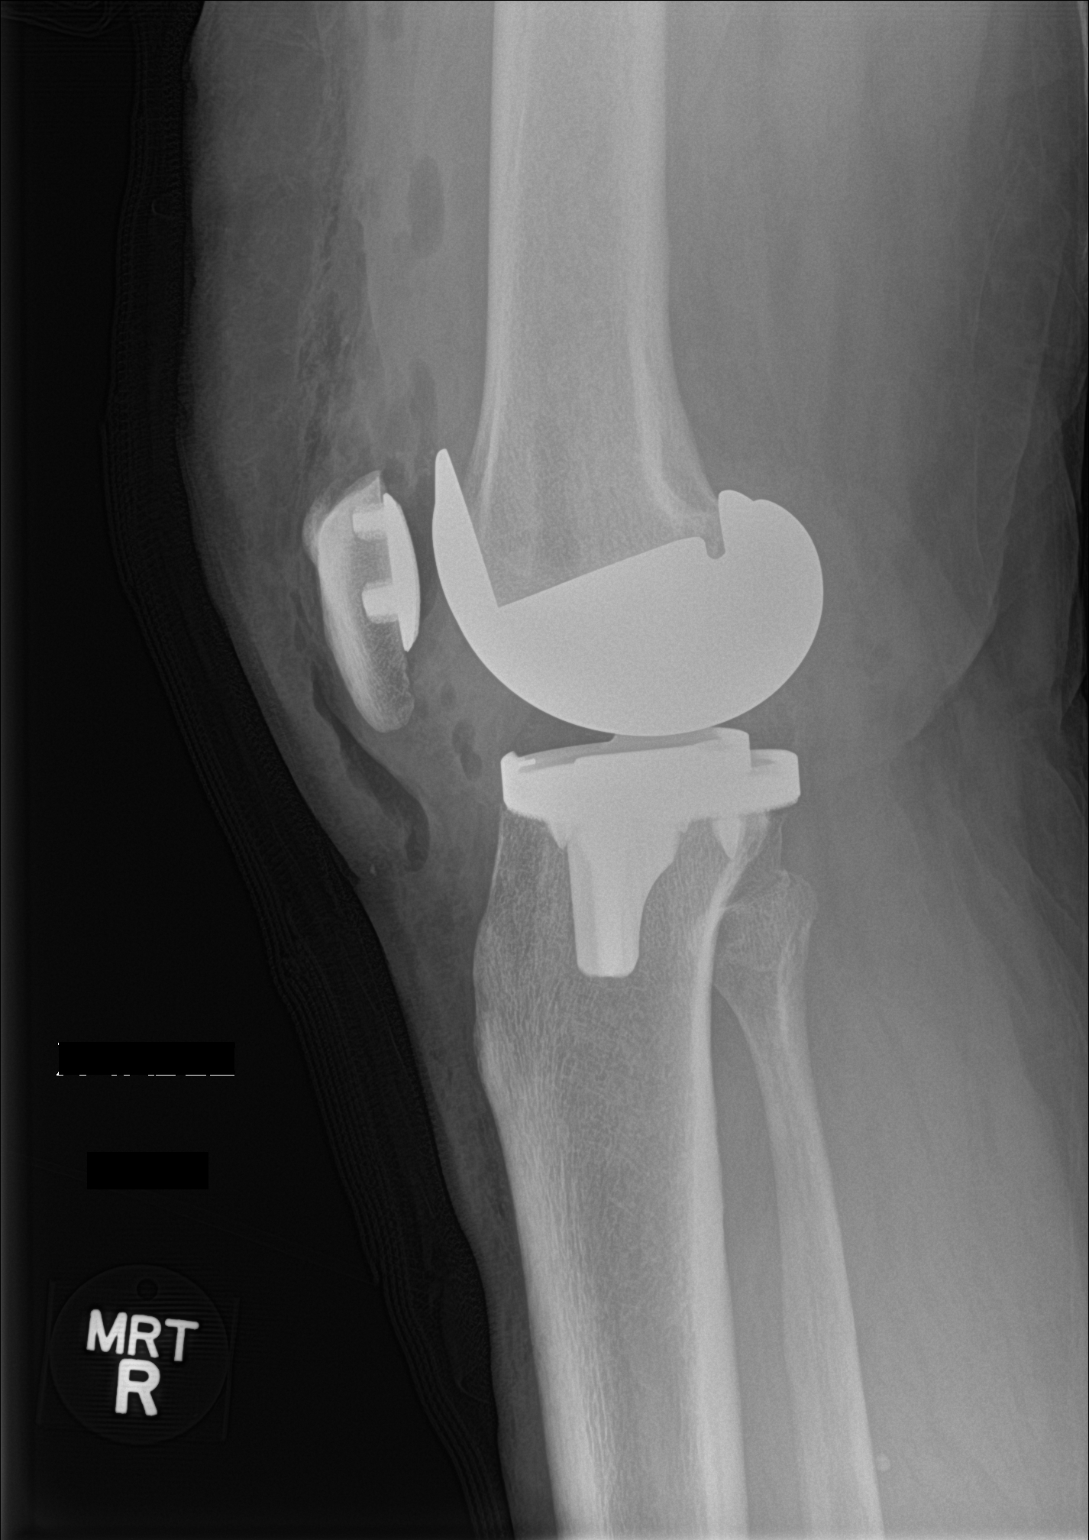

[2 of 2 positions shown; findings below may reference images not displayed]

FINDINGS: Total right knee arthroplasty with patellar resurfacing and
prosthesis. No apparent hardware related complication or
periprosthetic lucency or fracture. Postsurgical air and edema
within the joint space and soft tissues.
IMPRESSION: Right total knee arthroplasty without apparent hardware related
complication.

By: Fila Crump M.D.

## 2019-03-21 ENCOUNTER — Ambulatory Visit (HOSPITAL_BASED_OUTPATIENT_CLINIC_OR_DEPARTMENT_OTHER): Payer: Medicare Other | Admitting: Anesthesiology

## 2019-03-21 ENCOUNTER — Encounter (HOSPITAL_BASED_OUTPATIENT_CLINIC_OR_DEPARTMENT_OTHER): Payer: Self-pay | Admitting: Surgery

## 2019-03-21 ENCOUNTER — Encounter (HOSPITAL_BASED_OUTPATIENT_CLINIC_OR_DEPARTMENT_OTHER)
Admission: RE | Disposition: A | Discharge status: Home / Self Care | Payer: Self-pay | Source: Home / Self Care | Attending: Surgery

## 2019-03-21 ENCOUNTER — Ambulatory Visit (HOSPITAL_BASED_OUTPATIENT_CLINIC_OR_DEPARTMENT_OTHER)
Admission: RE | Admit: 2019-03-21 | Discharge: 2019-03-21 | Disposition: A | Discharge status: Home / Self Care | Payer: Medicare Other | Attending: Surgery | Admitting: Surgery

## 2019-03-21 DIAGNOSIS — Z79899 Other long term (current) drug therapy: Secondary | ICD-10-CM | POA: Insufficient documentation

## 2019-03-21 DIAGNOSIS — G4730 Sleep apnea, unspecified: Secondary | ICD-10-CM | POA: Insufficient documentation

## 2019-03-21 DIAGNOSIS — Z883 Allergy status to other anti-infective agents status: Secondary | ICD-10-CM | POA: Insufficient documentation

## 2019-03-21 DIAGNOSIS — I10 Essential (primary) hypertension: Secondary | ICD-10-CM | POA: Diagnosis not present

## 2019-03-21 DIAGNOSIS — Z7982 Long term (current) use of aspirin: Secondary | ICD-10-CM | POA: Diagnosis not present

## 2019-03-21 DIAGNOSIS — Z881 Allergy status to other antibiotic agents status: Secondary | ICD-10-CM | POA: Insufficient documentation

## 2019-03-21 DIAGNOSIS — E7800 Pure hypercholesterolemia, unspecified: Secondary | ICD-10-CM | POA: Diagnosis not present

## 2019-03-21 DIAGNOSIS — K219 Gastro-esophageal reflux disease without esophagitis: Secondary | ICD-10-CM | POA: Diagnosis not present

## 2019-03-21 DIAGNOSIS — K648 Other hemorrhoids: Secondary | ICD-10-CM | POA: Diagnosis not present

## 2019-03-21 DIAGNOSIS — K642 Third degree hemorrhoids: Secondary | ICD-10-CM | POA: Diagnosis not present

## 2019-03-21 DIAGNOSIS — K643 Fourth degree hemorrhoids: Secondary | ICD-10-CM | POA: Diagnosis not present

## 2019-03-21 DIAGNOSIS — K625 Hemorrhage of anus and rectum: Secondary | ICD-10-CM | POA: Diagnosis present

## 2019-03-21 HISTORY — PX: EVALUATION UNDER ANESTHESIA WITH HEMORRHOIDECTOMY: SHX5624

## 2019-03-21 SURGERY — EXAM UNDER ANESTHESIA WITH HEMORRHOIDECTOMY
Anesthesia: General

## 2019-03-21 MED ORDER — KETOROLAC TROMETHAMINE 30 MG/ML IJ SOLN
INTRAMUSCULAR | Status: AC
  Filled 2019-03-21: qty 1

## 2019-03-21 MED ORDER — BUPIVACAINE-EPINEPHRINE 0.5% -1:200000 IJ SOLN
INTRAMUSCULAR | Status: DC | PRN
  Administered 2019-03-21: 20 mL

## 2019-03-21 MED ORDER — FENTANYL CITRATE (PF) 100 MCG/2ML IJ SOLN
25.0000 ug | INTRAMUSCULAR | Status: DC | PRN
  Filled 2019-03-21: qty 1

## 2019-03-21 MED ORDER — ACETAMINOPHEN 160 MG/5ML PO SOLN
1000.0000 mg | Freq: Once | ORAL | Status: DC | PRN
  Filled 2019-03-21: qty 40.6

## 2019-03-21 MED ORDER — TRAMADOL HCL 50 MG PO TABS: 50.0000 mg | ORAL_TABLET | Freq: Four times a day (QID) | ORAL | 0 refills | Status: DC | PRN

## 2019-03-21 MED ORDER — ONDANSETRON HCL 4 MG/2ML IJ SOLN
INTRAMUSCULAR | Status: DC | PRN
  Administered 2019-03-21: 4 mg via INTRAVENOUS

## 2019-03-21 MED ORDER — PROPOFOL 10 MG/ML IV BOLUS
INTRAVENOUS | Status: AC
  Filled 2019-03-21: qty 20

## 2019-03-21 MED ORDER — ONDANSETRON HCL 4 MG/2ML IJ SOLN
INTRAMUSCULAR | Status: AC
  Filled 2019-03-21: qty 2

## 2019-03-21 MED ORDER — DEXAMETHASONE SODIUM PHOSPHATE 10 MG/ML IJ SOLN
INTRAMUSCULAR | Status: AC
  Filled 2019-03-21: qty 1

## 2019-03-21 MED ORDER — LACTATED RINGERS IV SOLN
INTRAVENOUS | Status: DC
  Filled 2019-03-21: qty 1000

## 2019-03-21 MED ORDER — ACETAMINOPHEN 10 MG/ML IV SOLN
1000.0000 mg | Freq: Once | INTRAVENOUS | Status: DC | PRN
  Filled 2019-03-21: qty 100

## 2019-03-21 MED ORDER — SODIUM CHLORIDE 0.9 % IV SOLN
1.0000 g | INTRAVENOUS | Status: AC
  Administered 2019-03-21: 13:00:00 1 g via INTRAVENOUS
  Filled 2019-03-21 (×2): qty 1

## 2019-03-21 MED ORDER — FENTANYL CITRATE (PF) 100 MCG/2ML IJ SOLN
50.0000 ug | INTRAMUSCULAR | Status: DC | PRN
  Administered 2019-03-21: 13:00:00 100 ug via INTRAVENOUS
  Filled 2019-03-21: qty 1

## 2019-03-21 MED ORDER — LIDOCAINE 2% (20 MG/ML) 5 ML SYRINGE
INTRAMUSCULAR | Status: DC | PRN
  Administered 2019-03-21: 60 mg via INTRAVENOUS

## 2019-03-21 MED ORDER — LIDOCAINE 2% (20 MG/ML) 5 ML SYRINGE
INTRAMUSCULAR | Status: AC
  Filled 2019-03-21: qty 5

## 2019-03-21 MED ORDER — OXYCODONE HCL 5 MG PO TABS
5.0000 mg | ORAL_TABLET | Freq: Once | ORAL | Status: DC | PRN
  Filled 2019-03-21: qty 1

## 2019-03-21 MED ORDER — EPHEDRINE SULFATE 50 MG/ML IJ SOLN
INTRAMUSCULAR | Status: DC | PRN
  Administered 2019-03-21: 15 mg via INTRAVENOUS

## 2019-03-21 MED ORDER — MIDAZOLAM HCL 2 MG/2ML IJ SOLN
1.0000 mg | INTRAMUSCULAR | Status: DC | PRN
  Administered 2019-03-21: 13:00:00 2 mg via INTRAVENOUS
  Filled 2019-03-21: qty 2

## 2019-03-21 MED ORDER — BUPIVACAINE LIPOSOME 1.3 % IJ SUSP
INTRAMUSCULAR | Status: DC | PRN
  Administered 2019-03-21: 20 mL

## 2019-03-21 MED ORDER — FENTANYL CITRATE (PF) 100 MCG/2ML IJ SOLN
INTRAMUSCULAR | Status: AC
  Filled 2019-03-21: qty 2

## 2019-03-21 MED ORDER — PROPOFOL 10 MG/ML IV BOLUS
INTRAVENOUS | Status: DC | PRN
  Administered 2019-03-21: 110 mg via INTRAVENOUS

## 2019-03-21 MED ORDER — ACETAMINOPHEN 500 MG PO TABS
1000.0000 mg | ORAL_TABLET | Freq: Once | ORAL | Status: DC | PRN
  Filled 2019-03-21: qty 2

## 2019-03-21 MED ORDER — CHLORHEXIDINE GLUCONATE CLOTH 2 % EX PADS
6.0000 | MEDICATED_PAD | Freq: Once | CUTANEOUS | Status: DC
  Filled 2019-03-21: qty 6

## 2019-03-21 MED ORDER — MIDAZOLAM HCL 2 MG/2ML IJ SOLN
INTRAMUSCULAR | Status: AC
  Filled 2019-03-21: qty 2

## 2019-03-21 MED ORDER — DEXAMETHASONE SODIUM PHOSPHATE 10 MG/ML IJ SOLN
INTRAMUSCULAR | Status: DC | PRN
  Administered 2019-03-21: 5 mg via INTRAVENOUS

## 2019-03-21 MED ORDER — OXYCODONE HCL 5 MG/5ML PO SOLN
5.0000 mg | Freq: Once | ORAL | Status: DC | PRN
  Filled 2019-03-21: qty 5

## 2019-03-21 SURGICAL SUPPLY — 34 items
BLADE SURG 15 STRL LF DISP TIS (BLADE) ×1 IMPLANT
BLADE SURG 15 STRL SS (BLADE) ×3
CANISTER SUCT 1200ML W/VALVE (MISCELLANEOUS) ×3 IMPLANT
COVER MAYO STAND STRL (DRAPES) IMPLANT
COVER WAND RF STERILE (DRAPES) IMPLANT
DECANTER SPIKE VIAL GLASS SM (MISCELLANEOUS) ×3 IMPLANT
DRAPE UTILITY XL STRL (DRAPES) ×3 IMPLANT
DRSG PAD ABDOMINAL 8X10 ST (GAUZE/BANDAGES/DRESSINGS) IMPLANT
ELECT REM PT RETURN 9FT ADLT (ELECTROSURGICAL) ×3
ELECTRODE REM PT RTRN 9FT ADLT (ELECTROSURGICAL) ×1 IMPLANT
GAUZE SPONGE 4X4 12PLY STRL LF (GAUZE/BANDAGES/DRESSINGS) ×3 IMPLANT
GLOVE SURG ORTHO 8.0 STRL STRW (GLOVE) ×3 IMPLANT
GOWN STRL REUS W/ TWL LRG LVL3 (GOWN DISPOSABLE) ×1 IMPLANT
GOWN STRL REUS W/ TWL XL LVL3 (GOWN DISPOSABLE) ×1 IMPLANT
GOWN STRL REUS W/TWL LRG LVL3 (GOWN DISPOSABLE) ×3
GOWN STRL REUS W/TWL XL LVL3 (GOWN DISPOSABLE) ×3
NDL HYPO 25X1 1.5 SAFETY (NEEDLE) ×1 IMPLANT
NEEDLE HYPO 25X1 1.5 SAFETY (NEEDLE) ×3 IMPLANT
PACK BASIN DAY SURGERY FS (CUSTOM PROCEDURE TRAY) ×3 IMPLANT
PACK LITHOTOMY IV (CUSTOM PROCEDURE TRAY) IMPLANT
PAD ABD 8X10 STRL (GAUZE/BANDAGES/DRESSINGS) ×2 IMPLANT
PENCIL SMOKE EVACUATOR (MISCELLANEOUS) ×3 IMPLANT
SHEARS HARMONIC 9CM CVD (BLADE) IMPLANT
SPONGE SURGIFOAM ABS GEL 12-7 (HEMOSTASIS) IMPLANT
SURGILUBE 2OZ TUBE FLIPTOP (MISCELLANEOUS) ×12 IMPLANT
SUT CHROMIC 3 0 SH 27 (SUTURE) ×3 IMPLANT
SYR CONTROL 10ML LL (SYRINGE) ×3 IMPLANT
TOWEL GREEN STERILE FF (TOWEL DISPOSABLE) ×6 IMPLANT
TRAY DSU PREP LF (CUSTOM PROCEDURE TRAY) ×3 IMPLANT
TRAY PROCTOSCOPIC FIBER OPTIC (SET/KITS/TRAYS/PACK) IMPLANT
TUBE CONNECTING 20'X1/4 (TUBING) ×1
TUBE CONNECTING 20X1/4 (TUBING) ×2 IMPLANT
UNDERPAD 30X36 HEAVY ABSORB (UNDERPADS AND DIAPERS) ×3 IMPLANT
YANKAUER SUCT BULB TIP NO VENT (SUCTIONS) ×3 IMPLANT

## 2019-03-21 NOTE — Transfer of Care (Signed)
Immediate Anesthesia Transfer of Care Note  Patient: Julie Dougherty  Procedure(s) Performed: OPEN HEMORRHOIDECTOMY (N/A )  Patient Location: PACU  Anesthesia Type:General  Level of Consciousness: awake, alert , oriented and patient cooperative  Airway & Oxygen Therapy: Patient Spontanous Breathing and Patient connected to nasal cannula oxygen  Post-op Assessment: Report given to RN and Post -op Vital signs reviewed and stable  Post vital signs: Reviewed and stable  Last Vitals:  Vitals Value Taken Time  BP    Temp    Pulse    Resp 22 03/21/19 1326  SpO2    Vitals shown include unvalidated device data.  Last Pain:  Vitals:   03/21/19 1058  TempSrc: Oral  PainSc: 0-No pain      Patients Stated Pain Goal: 7 ([id number])  Complications: No apparent anesthesia complications

## 2019-03-21 NOTE — Anesthesia Preprocedure Evaluation (Addendum)
Anesthesia Evaluation  Patient identified by MRN, date of birth, ID band Patient awake    Reviewed: Allergy & Precautions, Patient's Chart, lab work & pertinent test results  Airway Mallampati: III  TM Distance: >3 FB Neck ROM: Full    Dental  (+) Teeth Intact, Dental Advisory Given, Caps,    Pulmonary sleep apnea , former smoker,    breath sounds clear to auscultation       Cardiovascular hypertension, Pt. on medications  Rhythm:Regular Rate:Normal     Neuro/Psych negative neurological ROS  negative psych ROS   GI/Hepatic hiatal hernia, PUD, GERD  Medicated and Controlled,(+) Hepatitis -, B  Endo/Other  negative endocrine ROS  Renal/GU negative Renal ROS     Musculoskeletal  (+) Arthritis ,   Abdominal (+) + obese,   Peds  Hematology negative hematology ROS (+)   Anesthesia Other Findings   Reproductive/Obstetrics                           Lab Results  Component Value Date   WBC 6.6 07/16/2018   HGB 13.8 07/16/2018   HCT 42.1 07/16/2018   MCV 88.7 07/16/2018   PLT 251.0 07/16/2018   Lab Results  Component Value Date   CREATININE 0.97 08/30/2018   BUN 10 08/30/2018   NA 139 08/30/2018   K 4.2 08/30/2018   CL 103 08/30/2018   CO2 28 08/30/2018     Anesthesia Physical Anesthesia Plan  ASA: II  Anesthesia Plan: General   Post-op Pain Management:    Induction: Intravenous  PONV Risk Score and Plan: 4 or greater and Ondansetron, Dexamethasone, Treatment may vary due to age or medical condition and Midazolam  Airway Management Planned: LMA  Additional Equipment: None  Intra-op Plan:   Post-operative Plan: Extubation in OR  Informed Consent: I have reviewed the patients History and Physical, chart, labs and discussed the procedure including the risks, benefits and alternatives for the proposed anesthesia with the patient or authorized representative who has indicated  his/her understanding and acceptance.     Dental advisory given  Plan Discussed with: CRNA  Anesthesia Plan Comments:         Anesthesia Quick Evaluation

## 2019-03-21 NOTE — Discharge Instructions (Signed)
  Post Anesthesia Home Care Instructions  Activity: Get plenty of rest for the remainder of the day. A responsible individual must stay with you for 24 hours following the procedure.  For the next 24 hours, DO NOT: -Drive a car -Operate machinery -Drink alcoholic beverages -Take any medication unless instructed by your physician -Make any legal decisions or sign important papers.  Meals: Start with liquid foods such as gelatin or soup. Progress to regular foods as tolerated. Avoid greasy, spicy, heavy foods. If nausea and/or vomiting occur, drink only clear liquids until the nausea and/or vomiting subsides. Call your physician if vomiting continues.  Special Instructions/Symptoms: Your throat may feel dry or sore from the anesthesia or the breathing tube placed in your throat during surgery. If this causes discomfort, gargle with warm salt water. The discomfort should disappear within 24 hours.  If you had a scopolamine patch placed behind your ear for the management of post- operative nausea and/or vomiting:  1. The medication in the patch is effective for 72 hours, after which it should be removed.  Wrap patch in a tissue and discard in the trash. Wash hands thoroughly with soap and water. 2. You may remove the patch earlier than 72 hours if you experience unpleasant side effects which may include dry mouth, dizziness or visual disturbances. 3. Avoid touching the patch. Wash your hands with soap and water after contact with the patch.    Information for Discharge Teaching: EXPAREL (bupivacaine liposome injectable suspension)   Your surgeon or anesthesiologist gave you EXPAREL(bupivacaine) to help control your pain after surgery.  EXPAREL is a local anesthetic that provides pain relief by numbing the tissue around the surgical site. EXPAREL is designed to release pain medication over time and can control pain for up to 72 hours. Depending on how you respond to EXPAREL, you may require  less pain medication during your recovery.  Possible side effects: Temporary loss of sensation or ability to move in the area where bupivacaine was injected. Nausea, vomiting, constipation Rarely, numbness and tingling in your mouth or lips, lightheadedness, or anxiety may occur. Call your doctor right away if you think you may be experiencing any of these sensations, or if you have other questions regarding possible side effects.  Follow all other discharge instructions given to you by your surgeon or nurse. Eat a healthy diet and drink plenty of water or other fluids.  If you return to the hospital for any reason within 96 hours following the administration of EXPAREL, it is important for health care providers to know that you have received this anesthetic. A teal colored band has been placed on your arm with the date, time and amount of EXPAREL you have received in order to alert and inform your health care providers. Please leave this armband in place for the full 96 hours following administration, and then you may remove the band.  

## 2019-03-21 NOTE — Interval H&P Note (Signed)
History and Physical Interval Note:  03/21/2019 12:17 PM  Metaline  has presented today for surgery, with the diagnosis of grande IV internal hemorrhoid prolapse.  The various methods of treatment have been discussed with the patient and family. After consideration of risks, benefits and other options for treatment, the patient has consented to    Procedure(s): OPEN HEMORRHOIDECTOMY (N/A) as a surgical intervention.    The patient's history has been reviewed, patient examined, no change in status, stable for surgery.  I have reviewed the patient's chart and labs.  Questions were answered to the patient's satisfaction.    Armandina Gemma, MD [facility] Office: [place]

## 2019-03-21 NOTE — Op Note (Signed)
Operative Note  Pre-operative Diagnosis:  Right anterior column prolapsed internal hemorrhoid  Post-operative Diagnosis:  same  Surgeon:  Armandina Gemma, MD  Assistant:  none   Procedure:  Exam under anesthesia, open right anterior column hemorrhoidectomy  Anesthesia:  general  Estimated Blood Loss:  25 cc  Drains: none         Specimen: to pathology  Indications:  Patient is referred by Jaynee Eagles, PA-C, at [facility] for surgical evaluation and management of prolapsed internal hemorrhoids. Patient is accompanied by her son who acts as a Optometrist. Patient has had long-standing problems with internal and external hemorrhoids. She has previously been treated with rubber band ligation by Dr. Silvano Rusk, her gastroenterologist. She has had no other rectal surgery. Patient is noted a protruding mass from the anus for a few months. This has gradually become larger. She has intermittent episodes of bleeding. She has discomfort. She presents today for hemorrhoidectomy.  Procedure:  The patient was seen in the pre-op holding area. The risks, benefits, complications, treatment options, and expected outcomes were previously discussed with the patient. The patient agreed with the proposed plan and has signed the informed consent form.  The patient was brought to the operating room by the surgical team, identified as [place] and the procedure verified. A "time out" was completed and the above information confirmed.  Following administration of general anesthesia, the patient was positioned in lithotomy and then prepped and draped in the usual aseptic fashion.  After ascertaining that an adequate level of anesthesia been achieved, digital rectal examination is performed.  There is an obvious prolapsed hemorrhoid in the right anterior column.  Remainder of the external examination is normal.  There are no palpable masses on digital examination.  Anal speculum is introduced  and the anal canal is evaluated.  Again, the only significant pathology is the prolapsed right anterior hemorrhoidal column.  An Allis clamp was used to grasp the skin at the anoderm.  A 0 Chromic Gut suture is then placed at the apex of the hemorrhoidal column and tied securely.  Exparel is then infiltrated beginning at the skin and extending along the hemorrhoidal column up to the apex.  Using the harmonic scalpel, an incision is made on the skin and carried across the anal verge.  Care is taken to avoid the underlying sphincter mechanism.  The harmonic scalpel was used to excise the entire hemorrhoidal column up to the apex where the tissue was transected and the entire hemorrhoidal column was removed and submitted to pathology for review.  Good hemostasis is noted.  Mucosa is then reapproximated beginning at the apex using the 0 Chromic Gut suture in a running locking fashion out to the skin where it is tied securely.  Additional local anesthetic is infiltrated around the anus and in the submucosa around the site of the hemorrhoidectomy.  Good hemostasis is noted.  A plug of Gelfoam is inserted with lubrication into the anal canal.  External dressings are applied.  Mesh panties are applied to secure the dressings.  Patient is taken out of lithotomy and awakened from anesthesia and transported to the recovery room in stable condition.  The patient tolerated the procedure well.   Armandina Gemma, MD [facility] Office: [phone]

## 2019-03-21 NOTE — Anesthesia Procedure Notes (Signed)
Procedure Name: LMA Insertion Date/Time: 03/21/2019 12:41 PM Performed by: Wanita Chamberlain, CRNA Pre-anesthesia Checklist: Patient identified, Timeout performed, Emergency Drugs available, Suction available and Patient being monitored Patient Re-evaluated:Patient Re-evaluated prior to induction Oxygen Delivery Method: Circle system utilized Preoxygenation: Pre-oxygenation with 100% oxygen Induction Type: IV induction Ventilation: Mask ventilation without difficulty LMA: LMA inserted LMA Size: 4.0 Number of attempts: 1 Airway Equipment and Method: Bite block Placement Confirmation: breath sounds checked- equal and bilateral,  CO2 detector and positive ETCO2 Tube secured with: Tape Dental Injury: Teeth and Oropharynx as per pre-operative assessment

## 2019-03-21 NOTE — Progress Notes (Signed)
Sitz bath explained to patient

## 2019-03-24 LAB — SURGICAL PATHOLOGY

## 2019-03-26 NOTE — Anesthesia Postprocedure Evaluation (Signed)
Anesthesia Post Note  Patient: Julie Dougherty  Procedure(s) Performed: OPEN HEMORRHOIDECTOMY (N/A )     Patient location during evaluation: PACU Anesthesia Type: General Level of consciousness: awake and alert Pain management: pain level controlled Vital Signs Assessment: post-procedure vital signs reviewed and stable Respiratory status: spontaneous breathing, nonlabored ventilation, respiratory function stable and patient connected to nasal cannula oxygen Cardiovascular status: blood pressure returned to baseline and stable Postop Assessment: no apparent nausea or vomiting Anesthetic complications: no    Last Vitals:  Vitals:   03/21/19 1345 03/21/19 1447  BP: 100/74 113/77  Pulse: 88 78  Resp: 17 18  Temp:    SpO2: 99% 98%    Last Pain:  Vitals:   03/21/19 1447  TempSrc:   PainSc: 0-No pain                 Travis Purk

## 2019-03-31 DIAGNOSIS — H35341 Macular cyst, hole, or pseudohole, right eye: Secondary | ICD-10-CM | POA: Diagnosis not present

## 2019-03-31 DIAGNOSIS — H1013 Acute atopic conjunctivitis, bilateral: Secondary | ICD-10-CM | POA: Diagnosis not present

## 2019-03-31 DIAGNOSIS — Z961 Presence of intraocular lens: Secondary | ICD-10-CM | POA: Diagnosis not present

## 2019-03-31 DIAGNOSIS — H0102A Squamous blepharitis right eye, upper and lower eyelids: Secondary | ICD-10-CM | POA: Diagnosis not present

## 2019-03-31 DIAGNOSIS — H0102B Squamous blepharitis left eye, upper and lower eyelids: Secondary | ICD-10-CM | POA: Diagnosis not present

## 2019-04-03 ENCOUNTER — Ambulatory Visit: Payer: Medicare Other

## 2019-04-03 ENCOUNTER — Ambulatory Visit: Payer: Medicaid Other | Attending: Internal Medicine

## 2019-04-03 DIAGNOSIS — Z23 Encounter for immunization: Secondary | ICD-10-CM | POA: Insufficient documentation

## 2019-04-03 NOTE — Progress Notes (Signed)
   Covid-19 Vaccination Clinic  Name:  Julie Dougherty    MRN: [id number] DOB: 1950/08/09  04/03/2019  [name] was observed post Covid-19 immunization for 15 minutes without incident. She was provided with Vaccine Information Sheet and instruction to access the V-Safe system.   [name] was instructed to call 911 with any severe reactions post vaccine: Marland Kitchen Difficulty breathing  . Swelling of face and throat  . A fast heartbeat  . A bad rash all over body  . Dizziness and weakness   Immunizations Administered    Name Date Dose VIS Date Route   Pfizer COVID-19 Vaccine 04/03/2019  6:26 PM 0.3 mL 01/10/2019 Intramuscular   Manufacturer: [place]   Lot: [id number]   [place]: [id number]

## 2019-04-30 ENCOUNTER — Ambulatory Visit: Payer: Self-pay

## 2019-05-01 ENCOUNTER — Other Ambulatory Visit: Payer: Self-pay

## 2019-05-01 ENCOUNTER — Telehealth: Payer: Self-pay | Admitting: Family Medicine

## 2019-05-01 DIAGNOSIS — E7800 Pure hypercholesterolemia, unspecified: Secondary | ICD-10-CM

## 2019-05-01 DIAGNOSIS — I10 Essential (primary) hypertension: Secondary | ICD-10-CM

## 2019-05-01 MED ORDER — PRAVASTATIN SODIUM 20 MG PO TABS: 20.0000 mg | ORAL_TABLET | Freq: Every day | ORAL | 1 refills | Status: DC

## 2019-05-01 MED ORDER — AMLODIPINE BESY-BENAZEPRIL HCL 5-20 MG PO CAPS: 1.0000 | ORAL_CAPSULE | Freq: Every day | ORAL | 1 refills | Status: DC

## 2019-05-01 NOTE — Telephone Encounter (Signed)
Patient daughter is calling and requesting a refill for cholesterol and blood pressure medication to be sent to [facility] on [facility]. Daughter didn't know the name of medication. CB is [phone]

## 2019-05-01 NOTE — Telephone Encounter (Signed)
Refill sent to [facility] today. Pt's daughter aware.

## 2019-05-01 NOTE — Telephone Encounter (Signed)
Last OV 08/30/18 Last fill for both meds 08/30/18  #90/1

## 2019-05-06 ENCOUNTER — Ambulatory Visit: Payer: Medicare Other | Attending: Internal Medicine

## 2019-05-06 DIAGNOSIS — Z23 Encounter for immunization: Secondary | ICD-10-CM

## 2019-05-06 NOTE — Progress Notes (Signed)
   Covid-19 Vaccination Clinic  Name:  Julie Dougherty    MRN: [id number] DOB: February 06, 1950  05/06/2019  [name] was observed post Covid-19 immunization for 15 minutes without incident. She was provided with Vaccine Information Sheet and instruction to access the V-Safe system.   [name] was instructed to call 911 with any severe reactions post vaccine: Marland Kitchen Difficulty breathing  . Swelling of face and throat  . A fast heartbeat  . A bad rash all over body  . Dizziness and weakness   Immunizations Administered    Name Date Dose VIS Date Route   Pfizer COVID-19 Vaccine 05/06/2019  3:34 PM 0.3 mL 01/10/2019 Intramuscular   Manufacturer: [place]   Lot: [id number]   [place]: [id number]

## 2019-05-08 ENCOUNTER — Encounter: Payer: Self-pay | Admitting: Internal Medicine

## 2019-05-29 ENCOUNTER — Telehealth: Payer: Self-pay | Admitting: Family Medicine

## 2019-05-29 NOTE — Progress Notes (Signed)
°  Chronic Care Management   Outreach Note  05/29/2019 Name: Julie Dougherty MRN: [id number] DOB: 19-Feb-1950  Referred by: [name], MD Reason for referral : No chief complaint on file.   An unsuccessful telephone outreach was attempted today. The patient was referred to the pharmacist for assistance with care management and care coordination.  This note is not being shared with the patient for the following reason: To respect privacy (The patient or proxy has requested that the information not be shared). Follow Up Plan:   Earney [place] Upstream Scheduler

## 2019-06-03 ENCOUNTER — Encounter: Payer: Medicare Other | Admitting: Internal Medicine

## 2019-06-03 ENCOUNTER — Other Ambulatory Visit: Payer: Self-pay

## 2019-06-04 ENCOUNTER — Encounter: Payer: Self-pay | Admitting: Family Medicine

## 2019-06-04 ENCOUNTER — Ambulatory Visit (INDEPENDENT_AMBULATORY_CARE_PROVIDER_SITE_OTHER): Payer: Medicare Other | Admitting: Family Medicine

## 2019-06-04 ENCOUNTER — Ambulatory Visit (INDEPENDENT_AMBULATORY_CARE_PROVIDER_SITE_OTHER): Payer: Medicare Other

## 2019-06-04 VITALS — BP 118/76 | HR 84 | Temp 98.2°F | Ht 61.0 in | Wt 200.4 lb

## 2019-06-04 DIAGNOSIS — Z23 Encounter for immunization: Secondary | ICD-10-CM | POA: Diagnosis not present

## 2019-06-04 DIAGNOSIS — M79641 Pain in right hand: Secondary | ICD-10-CM | POA: Diagnosis not present

## 2019-06-04 DIAGNOSIS — S29011A Strain of muscle and tendon of front wall of thorax, initial encounter: Secondary | ICD-10-CM | POA: Diagnosis not present

## 2019-06-04 DIAGNOSIS — I10 Essential (primary) hypertension: Secondary | ICD-10-CM

## 2019-06-04 DIAGNOSIS — Z0000 Encounter for general adult medical examination without abnormal findings: Secondary | ICD-10-CM

## 2019-06-04 DIAGNOSIS — G4730 Sleep apnea, unspecified: Secondary | ICD-10-CM

## 2019-06-04 DIAGNOSIS — E7800 Pure hypercholesterolemia, unspecified: Secondary | ICD-10-CM | POA: Diagnosis not present

## 2019-06-04 DIAGNOSIS — M79642 Pain in left hand: Secondary | ICD-10-CM

## 2019-06-04 LAB — CBC
HCT: 41.6 % (ref 36.0–46.0)
Hemoglobin: 13.4 g/dL (ref 12.0–15.0)
MCHC: 32.3 g/dL (ref 30.0–36.0)
MCV: 88.3 fl (ref 78.0–100.0)
Platelets: 241 10*3/uL (ref 150.0–400.0)
RBC: 4.71 Mil/uL (ref 3.87–5.11)
RDW: 13.6 % (ref 11.5–15.5)
WBC: 6.1 10*3/uL (ref 4.0–10.5)

## 2019-06-04 LAB — URINALYSIS, ROUTINE W REFLEX MICROSCOPIC
Bilirubin Urine: NEGATIVE
Hgb urine dipstick: NEGATIVE
Ketones, ur: NEGATIVE
Nitrite: NEGATIVE
RBC / HPF: NONE SEEN (ref 0–?)
Specific Gravity, Urine: 1.02 (ref 1.000–1.030)
Total Protein, Urine: NEGATIVE
Urine Glucose: NEGATIVE
Urobilinogen, UA: 0.2 (ref 0.0–1.0)
pH: 7 (ref 5.0–8.0)

## 2019-06-04 LAB — LDL CHOLESTEROL, DIRECT: Direct LDL: 114 mg/dL

## 2019-06-04 LAB — LIPID PANEL
Cholesterol: 199 mg/dL (ref 0–200)
HDL: 55.1 mg/dL (ref >39.00)
LDL Cholesterol: 115 mg/dL — ABNORMAL HIGH (ref 0–99)
NonHDL: 143.96
Total CHOL/HDL Ratio: 4
Triglycerides: 144 mg/dL (ref 0.0–149.0)
VLDL: 28.8 mg/dL (ref 0.0–40.0)

## 2019-06-04 LAB — COMPREHENSIVE METABOLIC PANEL
ALT: 15 U/L (ref 0–35)
AST: 14 U/L (ref 0–37)
Albumin: 4.2 g/dL (ref 3.5–5.2)
Alkaline Phosphatase: 69 U/L (ref 39–117)
BUN: 15 mg/dL (ref 6–23)
CO2: 27 mEq/L (ref 19–32)
Calcium: 9.1 mg/dL (ref 8.4–10.5)
Chloride: 104 mEq/L (ref 96–112)
Creatinine, Ser: 0.99 mg/dL (ref 0.40–1.20)
GFR: 55.75 mL/min — ABNORMAL LOW (ref >60.00)
Glucose, Bld: 95 mg/dL (ref 70–99)
Potassium: 4.4 mEq/L (ref 3.5–5.1)
Sodium: 137 mEq/L (ref 135–145)
Total Bilirubin: 0.5 mg/dL (ref 0.2–1.2)
Total Protein: 7.3 g/dL (ref 6.0–8.3)

## 2019-06-04 LAB — TSH: TSH: 1.45 u[IU]/mL (ref 0.35–4.50)

## 2019-06-04 NOTE — Progress Notes (Addendum)
Established Patient Office Visit  Subjective:  Patient ID: Julie Dougherty, female    DOB: August 25, 1950  Age: 69 y.o. MRN: [id number]  CC:  Chief Complaint  Patient presents with  . Annual Exam    CPE, no concerns pt fasting for labs.     HPI Schurz presents for follow-up of her hypertension and elevated cholesterol.  She is accompanied by her daughter who is helping [place] to translate.  Blood pressure has been controlled with her current regimen.  She is taking the Pravachol 20 mg daily.  She is fasting today.  She has a colonoscopy scheduled in July.  She was unable to go for a sleep study due to the pandemic.  Continues to snore.  She is not currently exercising.  She has experienced some pain in the right side of her chest wall with movement through her chest wall.  She has pain in her hands with some cramping with simple activity around the house.  Past Medical History:  Diagnosis Date  . Arthritis   . Cataract   . Chronic constipation   . Diverticulitis   . Diverticulosis   . Fatty liver   . GERD (gastroesophageal reflux disease)   . Hemorrhoids, internal, with bleeding Gr 2 prolapsed 06/30/2011   Found on colonoscopy 2011 and anoscopy May 2013   . Hepatitis    "B"    no rx >10 yrs  . Hiatal hernia   . High cholesterol   . Hypertension   . Internal hemorrhoids   . OSA (obstructive sleep apnea)    does not use CPAP  . Pre-diabetes   . PUD (peptic ulcer disease)   . Rectal ulceration 04/27/2009   Associated reactive/regenerative changes and fibromuscular extensions into lamina propria/Mucosal Prolapse  . Sleep apnea    Waiting on CPAP machine    Past Surgical History:  Procedure Laterality Date  . ABDOMINAL HYSTERECTOMY    . CATARACT EXTRACTION Right 03/2018  . COLONOSCOPY  04/27/09    POLYPOID FRAGMENT OF COLONIC MUCOSA WITH SURFACE  . EVALUATION UNDER ANESTHESIA WITH HEMORRHOIDECTOMY N/A 03/21/2019   Procedure: OPEN HEMORRHOIDECTOMY;   Surgeon: Armandina Gemma, MD;  Location: [place];  Service: General;  Laterality: N/A;  . HEMORRHOID BANDING    . 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 TOTAL KNEE ARTHROPLASTY Right 03/06/2017   Procedure: RIGHT TOTAL KNEE ARTHROPLASTY;  Surgeon: Mcarthur Rossetti, MD;  Location: [place];  Service: Orthopedics;  Laterality: Right;  . TUBAL LIGATION      Family History  Problem Relation Age of Onset  . Diabetes Sister   . Asthma Sister   . Hyperlipidemia Sister   . Hypertension Sister   . Colon cancer Father 41  . Cancer Father   . Arthritis Mother   . Hypertension Mother   . Depression Sister   . Mental illness Sister   . Esophageal cancer Neg Hx   . Pancreatic cancer Neg Hx   . Stomach cancer Neg Hx     Social History   Socioeconomic History  . Marital status: Single    Spouse name: Not on file  . Number of children: 3  . Years of education: Not on file  . Highest education level: Not on file  Occupational History  . Not on file  Tobacco Use  . Smoking status:  Former Smoker  . Smokeless tobacco: Never Used  . Tobacco comment: "when she was young"  Substance and Sexual Activity  . Alcohol use: No    Comment: seldom  . Drug use: No  . Sexual activity: Not on file  Other Topics Concern  . Not on file  Social History Narrative  . Not on file   Social Determinants of Health   Financial Resource Strain:   . Difficulty of Paying Living Expenses:   Food Insecurity:   . Worried About Charity fundraiser in the Last Year:   . Arboriculturist in the Last Year:   Transportation Needs:   . Film/video editor (Medical):   Marland Kitchen Lack of Transportation (Non-Medical):   Physical Activity:   . Days of Exercise per Week:   . Minutes of Exercise per Session:   Stress:   . Feeling of Stress :   Social  Connections:   . Frequency of Communication with Friends and Family:   . Frequency of Social Gatherings with Friends and Family:   . Attends Religious Services:   . Active Member of Clubs or Organizations:   . Attends Archivist Meetings:   Marland Kitchen Marital Status:   Intimate Partner Violence:   . Fear of Current or Ex-Partner:   . Emotionally Abused:   Marland Kitchen Physically Abused:   . Sexually Abused:     Outpatient Medications Prior to Visit  Medication Sig Dispense Refill  . amLODipine-benazepril (LOTREL) 5-20 MG capsule Take 1 capsule by mouth daily. 90 capsule 1  . Ascorbic Acid (VITAMIN C) 1000 MG tablet Take 1,000 mg by mouth daily.    Marland Kitchen aspirin EC 81 MG tablet Take 81 mg by mouth daily.    . cholecalciferol (VITAMIN D3) 25 MCG (1000 UNIT) tablet Take 1,000 Units by mouth daily. Unsure of dosage    . omega-3 fish oil (MAXEPA) 1000 MG CAPS capsule Take 1 mg by mouth.    . pantoprazole (PROTONIX) 40 MG tablet Take 1 tablet (40 mg total) by mouth daily. 90 tablet 2  . polyethylene glycol powder (MIRALAX) powder Take 17 g by mouth daily. 255 g 11  . pravastatin (PRAVACHOL) 20 MG tablet Take 1 tablet (20 mg total) by mouth daily. 90 tablet 1  . hydrocortisone (ANUSOL-HC) 25 MG suppository Place 1 suppository (25 mg total) rectally 2 (two) times daily. 14 suppository 0  . traMADol (ULTRAM) 50 MG tablet Take 1-2 tablets (50-100 mg total) by mouth every 6 (six) hours as needed for moderate pain or severe pain. 30 tablet 0   No facility-administered medications prior to visit.    Allergies  Allergen Reactions  . Ciprofloxacin Hives and Nausea Only  . Flagyl [Metronidazole] Hives and Nausea Only    ROS Review of Systems  Constitutional: Negative.   HENT: Negative.   Respiratory: Negative.  Negative for chest tightness, shortness of breath and wheezing.   Cardiovascular: Negative for chest pain.  Gastrointestinal: Negative.   Endocrine: Negative for polyphagia and polyuria.   Genitourinary: Negative.   Musculoskeletal: Positive for arthralgias.  Allergic/Immunologic: Negative for immunocompromised state.  Neurological: Negative for tremors and speech difficulty.  Hematological: Does not bruise/bleed easily.  Psychiatric/Behavioral: Negative.    Depression screen [facility] 2/9 06/04/2019 06/04/2019 06/18/2017  Decreased Interest 0 0 0  Down, Depressed, Hopeless 0 0 0  PHQ - 2 Score 0 0 0  Altered sleeping 0 - -  Tired, decreased energy 1 - -  Change in appetite  0 - -  Feeling bad or failure about yourself  0 - -  Trouble concentrating 0 - -  Moving slowly or fidgety/restless 0 - -  Suicidal thoughts 0 - -  PHQ-9 Score 1 - -  Difficult doing work/chores Not difficult at all - -      Objective:    Physical Exam  Constitutional: She is oriented to person, place, and time. She appears well-developed and well-nourished. No distress.  HENT:  Head: Normocephalic and atraumatic.  Right Ear: External ear normal.  Left Ear: External ear normal.  Mouth/Throat: Oropharynx is clear and moist.    Eyes: Conjunctivae are normal. Right eye exhibits no discharge. Left eye exhibits no discharge. No scleral icterus.  Neck: No JVD present. No tracheal deviation present.  Cardiovascular: Normal rate, regular rhythm and normal heart sounds.  Pulmonary/Chest: Effort normal and breath sounds normal. No stridor. No respiratory distress. She has no wheezes. She has no rales.  Abdominal: Bowel sounds are normal. She exhibits no distension. There is no abdominal tenderness. There is no rebound and no guarding.  Musculoskeletal:        General: No edema.       Hands:  Neurological: She is alert and oriented to person, place, and time.  Skin: Skin is warm and dry. She is not diaphoretic.  Psychiatric: She has a normal mood and affect. Her behavior is normal.    BP 118/76   Pulse 84   Temp 98.2 F (36.8 C) (Tympanic)   Ht 5\' 1"  (1.549 m)   Wt 200 lb 6.4 oz (90.9 kg)   SpO2  96%   BMI 37.87 kg/m  Wt Readings from Last 3 Encounters:  06/04/19 200 lb 6.4 oz (90.9 kg)  03/21/19 193 lb 9.6 oz (87.8 kg)  02/26/19 195 lb (88.5 kg)     Health Maintenance Due  Topic Date Due  . DEXA SCAN  Never done  . PNA vac Low Risk Adult (1 of 2 - PCV13) Never done  . COLONOSCOPY  04/28/2019    There are no preventive care reminders to display for this patient.  Lab Results  Component Value Date   TSH 1.45 06/04/2019   Lab Results  Component Value Date   WBC 6.1 06/04/2019   HGB 13.4 06/04/2019   HCT 41.6 06/04/2019   MCV 88.3 06/04/2019   PLT 241.0 06/04/2019   Lab Results  Component Value Date   NA 137 06/04/2019   K 4.4 06/04/2019   CO2 27 06/04/2019   GLUCOSE 95 06/04/2019   BUN 15 06/04/2019   CREATININE 0.99 06/04/2019   BILITOT 0.5 06/04/2019   ALKPHOS 69 06/04/2019   AST 14 06/04/2019   ALT 15 06/04/2019   PROT 7.3 06/04/2019   ALBUMIN 4.2 06/04/2019   CALCIUM 9.1 06/04/2019   ANIONGAP 14 03/07/2017   GFR 55.75 (L) 06/04/2019   Lab Results  Component Value Date   CHOL 199 06/04/2019   Lab Results  Component Value Date   HDL 55.10 06/04/2019   Lab Results  Component Value Date   LDLCALC 115 (H) 06/04/2019   Lab Results  Component Value Date   TRIG 144.0 06/04/2019   Lab Results  Component Value Date   CHOLHDL 4 06/04/2019   Lab Results  Component Value Date   HGBA1C 5.7 01/02/2018      Assessment & Plan:   Problem List Items Addressed This Visit      Cardiovascular and Mediastinum   Essential hypertension  Relevant Medications   pravastatin (PRAVACHOL) 40 MG tablet   Other Relevant Orders   CBC (Completed)   Comprehensive metabolic panel (Completed)   TSH (Completed)   Urinalysis, Routine w reflex microscopic (Completed)     Respiratory   Sleep apnea   Relevant Orders   Ambulatory referral to Sleep Studies     Musculoskeletal and Integument   Chest wall muscle strain     Other   Elevated cholesterol    Relevant Medications   pravastatin (PRAVACHOL) 40 MG tablet   Other Relevant Orders   LDL cholesterol, direct (Completed)   Lipid panel (Completed)   Healthcare maintenance   Relevant Orders   Ambulatory referral to Gynecology   Pain in both hands   Relevant Orders   DG Hand Complete Right (Completed)    Other Visit Diagnoses    Need for Tdap vaccination    -  Primary      Meds ordered this encounter  Medications  . pravastatin (PRAVACHOL) 40 MG tablet    Sig: Take 1 tablet (40 mg total) by mouth daily.    Dispense:  90 tablet    Refill:  3    Follow-up: Return in about 6 months (around 12/05/2019).   Patient given information on health maintenance and disease prevention.  Encouraged weight loss and simple exercise by walking for at least 30 minutes 5 days a week to improve her energy levels and sense of wellbeing.  Agrees to go for sleep study and Pap smear.  Given information in Spanish on health maintenance,  disease prevention, obesity and exercising to lose weight.  May need to increase Pravachol pending results of today's labs. [name], MD

## 2019-06-04 NOTE — Patient Instructions (Signed)
Mantenimiento de la salud despus de los 22 aos de edad Health Maintenance After Age 68 Despus de los 65 aos de edad, corre un riesgo mayor de Tourist information centre manager enfermedades e infecciones a Barrister's clerk, como tambin de sufrir lesiones por cadas. Las cadas son la causa principal de las fracturas de huesos y lesiones en la cabeza de personas mayores de 83 aos de edad. Recibir cuidados preventivos de forma regular puede ayudarlo a mantenerse saludable y en buen [place]. Los cuidados preventivos incluyen realizarse anlisis de forma regular y Actor en el estilo de vida segn las recomendaciones del mdico. Converse con el profesional que lo asiste sobre:  Las pruebas de deteccin y los anlisis que debe Dispensing optician. Una prueba de deteccin es un estudio que se para Hydrographic surveyor la presencia de una enfermedad cuando no tiene sntomas.  Un plan de dieta y ejercicios adecuado para usted. Qu debo saber sobre las pruebas de deteccin y los anlisis para prevenir cadas? Realizarse pruebas de deteccin y [employer] es la mejor manera de Hydrographic surveyor un problema de salud de forma temprana. El diagnstico y tratamiento tempranos le brindan la mejor oportunidad de Chief Technology Officer las afecciones mdicas que son comunes despus de los 77 aos de edad. Ciertas afecciones y elecciones de estilo de vida pueden hacer que sea ms propenso a sufrir Engineer, manufacturing. El mdico puede recomendarle lo siguiente:  Controles regulares de la visin. Una visin deficiente y afecciones como las cataratas pueden hacer que sea ms propenso a sufrir Engineer, manufacturing. Si [place] lentes, asegrese de obtener una receta actualizada si su visin cambia.  Revisin de medicamentos. Revise regularmente con el mdico todos los medicamentos que toma, incluidos los medicamentos de [place]. Consulte al [employer] efectos secundarios que pueden hacer que sea ms propenso a sufrir Engineer, manufacturing. Informe al mdico si alguno de los medicamentos que toma lo hace  sentir mareado o somnoliento.  Pruebas de deteccin para la osteoporosis. La osteoporosis es una afeccin que hace que los huesos se vuelvan ms frgiles. En consecuencia, los huesos pueden debilitarse y quebrarse ms fcilmente.  Pruebas de deteccin para la presin arterial. Los cambios en la presin arterial y los medicamentos para Chief Technology Officer la presin arterial pueden hacerlo sentir mareado.  Controles de fuerza y equilibrio. El mdico puede recomendar ciertos estudios para controlar su fuerza y equilibrio al estar de pie, al caminar o al cambiar de posicin.  Examen de los pies. El dolor y Chiropractor en los pies, como tambin no utilizar el calzado [place], pueden hacer que sea ms propenso a sufrir Engineer, manufacturing.  Prueba de deteccin de la depresin. Es ms probable que sufra una cada si tiene miedo a caerse, se siente mal emocionalmente o se siente incapaz de Patent examiner.  Prueba de deteccin de consumo de alcohol. Beber demasiado alcohol puede afectar su equilibrio y puede hacer que sea ms propenso a sufrir Engineer, manufacturing. Qu medidas puedo tomar para reducir mi riesgo de sufrir una cada? Instrucciones generales  Hable con el mdico sobre sus riesgos de sufrir una cada. Infrmele a su mdico si: ? Se cae. Asegrese de informarle a su mdico acerca de todas las cadas, incluso aquellas que parecen ser [employer]. ? Se siente mareado, somnoliento o que pierde el equilibrio.  Tome los medicamentos de venta libre y los recetados solamente como se lo haya indicado el mdico. Estos incluyen todos los suplementos.  Siga una dieta sana y [place] un peso saludable. Una dieta saludable incluye  productos lcteos descremados, carnes bajas en contenido de grasa ([place], fibra de granos enteros, frijoles y [place] frutas y verduras. La seguridad en el hogar  Retire los objetos que puedan causar tropiezos tales como alfombras, cables u obstculos.  Instale equipos de  seguridad, como barras para sostn en los baos y barandas de seguridad en las escaleras.  [place] habitaciones y los pasillos bien iluminados. Actividad   Siga un programa de ejercicio regular para mantenerse en forma. Esto lo ayudar a Contractor equilibrio. Consulte al mdico qu tipos de ejercicios son adecuados para usted.  Si necesita un bastn o un andador, selo segn las recomendaciones del mdico.  Utilice calzado con buen apoyo y suela antideslizante. Estilo de vida  No beba alcohol si el mdico le indica que no beba.  Si bebe alcohol, limite la cantidad que consume: ? De 0 a 1 medida por da para las mujeres. ? De 0 a 2 medidas por da para los hombres.  Est atento a la cantidad de alcohol que contiene su bebida. En los EE. UU., una medida equivale a una botella tpica de cerveza (12 onzas), media copa de vino (5 onzas) o una medida de bebida blanca (1 onza).  No consuma ningn producto que contenga nicotina o tabaco, como cigarrillos y Psychologist, sport and exercise. Si necesita ayuda para dejar de fumar, consulte al mdico. Resumen  Tener un estilo de vida saludable y recibir cuidados preventivos pueden ayudar a Theatre stage manager salud y el bienestar despus de los 86 aos de [place].  Realizarse pruebas de deteccin y [employer] es la mejor manera de Hydrographic surveyor un problema de salud de forma temprana y Lourena Simmonds a Product/process development scientist una cada. El diagnstico y tratamiento tempranos le brindan la mejor oportunidad de Chief Technology Officer las afecciones mdicas ms comunes en las personas mayores de 48 aos de edad.  Las cadas son la causa principal de las fracturas de huesos y lesiones en la cabeza de personas mayores de 48 aos de edad. Tome precauciones para evitar una cada en su casa.  Trabaje con el mdico para saber qu cambios que puede hacer para mejorar su salud y [place], y [place]. Esta informacin no tiene Marine scientist el consejo del mdico. Asegrese de hacerle al  mdico cualquier pregunta que tenga. Document Revised: 03/01/2017 Document Reviewed: 03/01/2017 Elsevier Patient Education  [place] High Cholesterol  El colesterol elevado es una afeccin en la que la sangre tiene niveles altos de una sustancia [place], cerosa y parecida a la grasa (colesterol). El organismo humano necesita una pequea cantidad de colesterol. El hgado fabrica todo el colesterol que el organismo necesita. El exceso de colesterol proviene de los alimentos que comemos. La sangre transporta el colesterol desde el hgado a travs de los vasos sanguneos. Si tiene el colesterol elevado, este puede depositarse (formar placas) en las paredes de los vasos sanguneos (arterias). Las Occupational hygienist y la rigidez [place] arterias. Las placas de colesterol aumentan el riesgo de sufrir un infarto de miocardio y un accidente cerebrovascular. Trabaje con el mdico para [employer] concentraciones de colesterol en un rango saludable. Qu incrementa el riesgo? Es ms probable que Orthoptist en las personas que:  Consumen alimentos con alto contenido de grasa animal (grasa saturada) o colesterol.  Tienen sobrepeso.  No hacen suficiente ejercicio fsico.  Tienen antecedentes familiares de colesterol elevado. Cules son los signos o los sntomas? Esta afeccin no presenta sntomas. Cmo se diagnostica? Esta  afeccin podra diagnosticarse a [place] de anlisis de [place].  Si es mayor de 20aos, es posible que el mdico le controle el colesterol cada [id number].  Los controles pueden ser ms frecuentes si ya tuvo el colesterol elevado u otros factores de riesgo de enfermedades cardacas. En el anlisis de sangre de [place], se determina lo siguiente:  El colesterol "malo" (colesterol LDL). Este es el principal tipo de colesterol que causa enfermedades cardacas. El nivel recomendado de LDL es de menos  de100.  El colesterol "bueno" (colesterol HDL). Este tipo ayuda a Tour manager las enfermedades [place] arterias y arrastrando el LDL. El nivel recomendado de HDL es de60 o superior.  Triglicridos. Estos son grasas que el organismo puede Financial controller o quemar como fuente de [place]. El nivel recomendado de triglicridos es de menos de 150.  Colesterol total. Esta es una medicin de la cantidad total de colesterol en la sangre, que incluye el colesterol LDL, el colesterol HDL y los triglicridos. El valor saludable es de menos de200. Cmo se trata? Esta afeccin se trata con cambios en la dieta y en el estilo de vida, y con medicamentos. Cambios en la [employer], la ingesta de una mayor cantidad de cereales integrales, frutas, verduras, frutos secos y pescado.  Tambin podran incluir la reduccin del consumo de carnes rojas y alimentos con Architectural technologist. Cambios en el estilo de vida  Entre ellos, realizar sesiones de ejercicios aerbicos durante, por lo menos, 36mnutos, 3veces por semana. Por ejemplo, caminar, andar en bicicleta y nadar. Los ejercicios aerbicos junto con una dieta sana pueden ayudar a que se mQuarry manageren un peso saludable.  Los cambios tambin podran incluir dejar de fumar. Medicamentos  Por lo general, se administran medicamentos si con los cLicensed conveyancery en el estilo de vida no se logra reducir el colesterol hasta niveles saludables.  El mdico podra recetarle estatinas. Se ha demostrado que las estatinas dAffiliated Computer Servicesniveles de [place]ant[place] lo que puede reducir el riesgo de pInsurance risk surveyoruna enfermedad cardaca. Siga estas indicaciones en su casa: Comida y bebida Si se lo indic el mdico:  Coma pollo (sin piel), pescado, ternera, mariscos, pechuga de pBelgiumy cortes de carne roja de pulpa o de lomo.  No coma alimentos fritos ni carnes grasosas, como salchichas y salame.  Coma muchas frutas, como  manzanas.  Coma gran cantidad de verduras, como brcoli, papas y zanahorias.  Coma porotos, guisantes secos y lentejas.  Coma cereales, como cebada, arroz, cuscs y trigo burgol.  Coma pastas sin salsas con crema.  TSimmesport y coma yogures y quesos descremados o semidescremados.  No coma ni beba lMattel crema, helado, yemas de huevo ni quesos duros.  No coma margarinas en barra ni untables que contengan grasas trans (que tambin se conocen como aceites parcialmente hidrogenados).  No coma aceites tropicales saturados, como el de coco y el de pPen [place]  No coma tortas, galletas, galletitas ni otros productos horneados que contengan grasas trans.  Instrucciones generales  Haga ejercicio segn las indicaciones del mdico. Aumente la cantidad de ejercicio fsico que realiza mediante actividades como jardinera, salir a cWritero usar las escaleras.  Tome los medicamentos de venta libre y los recetados solamente como se lo haya indicado el mdico.  No consuma ningn producto que contenga nicotina o tabaco, como cigarrillos y cPsychologist, sport and exercise Si necesita ayuda para dejar de fumar, consulte al mdico.  Concurra a todas las  visitas de control como se lo haya indicado el mdico. Esto es importante. Comunquese con un mdico si:  Tiene dificultad para seguir una dieta sana o mantener un peso saludable.  Necesita ayuda para comenzar un programa de ejercicios.  Necesita ayuda para dejar de fumar. Solicite ayuda de inmediato si:  Electronics engineer.  Tiene dificultad para respirar. Esta informacin no tiene Marine scientist el consejo del mdico. Asegrese de hacerle al mdico cualquier pregunta que tenga. Document Revised: 04/24/2016 Document Reviewed: 07/17/2015 Elsevier Patient Education  [place] preventivos en las mujeres a partir de los 46 aos de edad Preventive Care 24 Years and Older, Female Los  cuidados preventivos hacen referencia a las opciones en cuanto al estilo de vida y a las visitas al mdico, las cuales pueden promover la salud y Musician. Esto puede comprender lo siguiente:  Un examen fsico anual. Esto tambin se conoce como control de bienestar anual.  Exmenes dentales y oculares de [place] regular.  Vacunas.  Estudios para Engineer, building services.  Opciones saludables de estilo de vida, como dieta y ejercicios. Qu puedo esperar para mi visita de cuidado preventivo? Examen fsico El mdico controlar lo siguiente:  IT consultant y [place]. Estos datos se pueden usar para calcular el ndice de masa corporal ([place]), una medicin que indica si usted tiene un peso saludable.  Frecuencia cardaca y presin arterial.  Piel para detectar manchas anormales. Asesoramiento El mdico puede hacerle preguntas sobre lo siguiente:  Consumo de tabaco, alcohol y drogas.  Bienestar emocional.  Bienestar en el hogar y sus relaciones personales.  Actividad sexual.  Hbitos de alimentacin.  Antecedentes de cadas.  Memoria y capacidad de comprensin (facultades cognitivas).  Trabajo y [place] y antecedentes menstruales. Qu vacunas necesito?  [place] antigripal  Se recomienda aplicarse esta vacuna todos los [place]. Vacuna contra el ttanos, la difteria y la tos ferina (Tdap)  Es posible que tenga que aplicarse un refuerzo contra el ttanos y la difteria (DT) cada 10aos. Vacuna contra la varicela  Es posible que tenga que aplicrsela si an no la recibi. Vacuna contra el herpes zster (culebrilla)  Es posible que la necesite despus de los 23 aos de [place]. Vacuna antineumoccica conjugada (PCV13)  Se recomienda una dosis despus de los 8 aos de [place]. Vacuna antineumoccica de polisacridos (PPSV23)  Se recomienda una dosis despus de los 37 aos de [place]. Vacuna contra el sarampin, la rubola y las paperas ([place])  Es posible que  necesite aplicarse al menos una dosis de la vacuna SRP si naci despus de [phone]. Tambin es posible que necesite una segunda dosis. Edward Jolly antimeningoccica conjugada (MenACWY)  Puede necesitar esta vacuna si tiene determinadas afecciones. Vacuna contra la hepatitis A  Es posible que necesite esta vacuna si tiene ciertas afecciones o si viaja o trabaja en lugares en los que podra estar expuesta a la hepatitis A. Vacuna contra la hepatitis B  Es posible que necesite esta vacuna si tiene ciertas afecciones o si viaja o trabaja en lugares en los que podra estar expuesta a la hepatitis B. Vacuna antihaemophilus influenzae tipo B (Hib)  Puede necesitar esta vacuna si tiene determinadas afecciones. Puede recibir las vacunas en forma de dosis individuales o en forma de dos o ms vacunas juntas en la misma inyeccin (vacunas combinadas). Hable con su mdico [employer] y beneficios de las vacunas combinadas. Qu pruebas necesito? Anlisis de [employer] de lpidos y colesterol. Estos pueden  controlarse cada 5 aos o con ms frecuencia segn su salud general.  Anlisis de hepatitis C.  Anlisis de hepatitis B. Pruebas de deteccin  Pruebas de deteccin de cncer de pulmn. Es posible que se le realice esta prueba de deteccin a partir de los 65 aos de edad, si ha fumado durante 30 aos un paquete diario y sigue fumando o dej el hbito en algn momento en los ltimos 15 aos.  Pruebas de Programme researcher, broadcasting/film/video de Surveyor, minerals. Todos los adultos a partir de los 48 aos de edad y [place] 45 aos de edad deben hacerse esta prueba de deteccin. El mdico puede recomendarle las pruebas de deteccin a partir de los 4 aos de edad si corre un mayor riesgo. Le realizarn pruebas cada 1 a 10 aos, segn los [place] y el tipo de prueba de Programme researcher, broadcasting/film/video.  Pruebas de deteccin de la diabetes. Esto se Set designer un control del azcar en la sangre (glucosa) despus de no haber comido durante un  periodo de tiempo (ayuno). Es posible que se le realice esta prueba cada 1 a 3 aos.  Mamografa. Se puede realizar cada 1 o 2 aos. Hable con el mdico sobre la frecuencia con la que debe realizarse [place].  Pruebas de deteccin de cncer relacionado con las mutaciones del BRCA. Es posible que se las deba realizar si tiene antecedentes de cncer de mama, de ovario, de trompas o peritoneal. Otras pruebas  Anlisis de enfermedades de transmisin sexual (ETS).  Densitometra sea. Esto se realiza para detectar osteoporosis. Es posible que se le realice esta prueba a partir de los 63 aos de [place]. Siga estas instrucciones en su casa: Comida y bebida  Siga una dieta que incluya frutas y verduras frescas, cereales integrales, protenas magras y productos lcteos descremados. Limite el consumo de alimentos con alto contenido de Location manager, grasas saturadas y sal.  Tome los suplementos vitamnicos y minerales como se lo haya indicado el mdico.  No beba alcohol si el mdico se lo prohbe.  Si bebe alcohol: ? Limite la cantidad que consume de 0 a 1 medida por da. ? Est atenta a la cantidad de alcohol que hay en las bebidas que toma. En los [place], una medida equivale a una botella de cerveza de 12oz (336m), un vaso de vino de 5oz (1410m o un vaso de una bebida alcohlica de alta graduacin de 1oz (4480m Estilo de vidNavistar International Corporationlas encas a diario.  Mantngase activa. Haga al menos 21m34mos de ejercicio 5o ms das Hilton Hotelso consuma ningn producto que contenga nicotina o tabaco, como cigarrillos, cigarrillos electrnicos y tabaco de mascHigher education careers adviser necesita ayuda para dejar de fumar, consulte al mdico.  Si es sexualmente activa, practique sexo seguro. Use un condn u otra forma de proteccin para prevenir las ITS (infecciones de transmisin sexual).  Hable con el mdico acerca de tomar una dosis baja de aspirina o estatina. Cundo volver?  Acuda  al mdico una vez al ao para una visita de control.  Pregntele al mdico con qu frecuencia debe realizarse un control de la vista y los dientes.  Mantenga su esquema de vacunacin al da. Esta informacin no tiene comoMarine scientistconsejo del mdico. Asegrese de hacerle al mdico cualquier pregunta que tenga. Document Revised: 02/08/2018 Document Reviewed: 02/08/2018 Elsevier Patient Education  2020[place] Valleylos adultos Obesity, Adult La obesidad es una afeccin que implica tener demasiada grasa corporal total. TeneJannet Askew  u obesidad significa que el peso es mayor que lo que se considera saludable para Forensic psychologist. La obesidad se determina por Abner Greenspan llamada [facility]. El [place] (ndice de masa corporal) es la estimacin de la grasa corporal y se calcula a partir de la altura y [place]. Si un adulto tiene un [facility] de 30 o superior se considera obeso. La obesidad puede conducir a algunos de los siguientes problemas de salud y [place] graves:  Accidente cerebrovascular.  Arteriopata coronaria (EAC).  Diabetes tipo 2.  Algunos tipos de cncer, incluido el cncer de colon, mama, tero y vescula.  Artrosis.  Presin arterial alta (hipertensin arterial).  Colesterol alto.  Apnea del sueo.  Clculos en la vescula.  Problemas de esterilidad. Cules son las causas? Las causas ms frecuentes de esta afeccin [employer] siguientes:  Consumir diariamente alimentos con altos niveles de caloras, azcar y [place].  Nacer con genes que pueden hacerlo ms propenso a ser obeso.  Tener una afeccin que causa obesidad, por ejemplo: ? Hipotiroidismo. ? Sndrome del ovario poliqustico (SOP). ? Trastorno alimentario compulsivo. ? Sndrome de [place].  Tomar ciertos medicamentos, como esteroides, antidepresivos y [place].  No ser fsicamente activo (estilo de vida sedentario).  No dormir lo suficiente.  Beber grandes cantidades de  bebidas endulzadas con azcar, como refrescos. Qu incrementa el riesgo? Los siguientes factores pueden hacer que sea ms propenso a contraer esta afeccin:  Tener antecedentes familiares de obesidad.  Ser una mujer de origen afroamericano.  Ser un hombre de origen hispano.  Vivir en un rea con acceso limitado a las siguientes posibilidades: ? Parques, centros recreativos o veredas. ? Alimentos saludables, como se venden en tiendas de comestibles y mercados de [place]. Cules son los signos o los sntomas? El principal signo de esta afeccin es tener demasiada grasa corporal. Cmo se diagnostica? Esta afeccin se diagnostica en funcin de lo siguiente:  Su IMC. Si usted es un adulto y su [place] es de 30 o ms, se considera que es obeso.  La circunferencia de la cintura. Es Ardelia Mems medicin alrededor de Science writer.  El grosor del pliegue cutneo. El mdico puede pellizcar suavemente un pliegue de la piel y [place]. Es posible que le hagan otros estudios para ver si hay afecciones subyacentes. Cmo se trata? El tratamiento de esta afeccin frecuentemente incluye cambiar el estilo de vida. El tratamiento puede incluir algunos o todos los siguientes elementos:  Cambios en la dieta. Esto puede incluir el desarrollo de un plan de alimentacin saludable.  Realizar actividad fsica con regularidad. Puede incluir una actividad que hace que el corazn lata ms rpido (ejercicio [place]) y Chiropodist de Teacher, early years/pre. Trabajar con el mdico para disear un programa de ejercicios que sea adecuado para usted.  Medicamentos para ayudarle a bajar de peso si no puede perder 1 libra (450g) por semana despus de 6 semanas de comer saludablemente y de hacer ms actividad fsica.  Tratar las afecciones que causan la obesidad (afecciones preexistentes).  Ciruga. Las opciones quirrgicas pueden incluir bandas gstricas y bypass gstrico. Se puede realizar una ciruga si: ? Otros tratamientos no  mejoraron su afeccin. ? Tiene un IMC de 40 o superior. ? Tiene problemas de salud potencialmente mortales relacionados con la obesidad. Siga estas instrucciones en su casa: Comida y bebida   Siga las instrucciones del mdico respecto de lo que puede comer o beber. El mdico puede indicarle que haga lo siguiente: ? Limitar las comidas rpidas, los dulces y las colaciones procesadas. ? Elegir  opciones con bajo contenido de grasa, como leche descremada en lugar de [place] entera. ? Consumir 5o ms porciones de frutas o verduras por da. ? Comer en casa con ms frecuencia. Esto le da ms control sobre lo que come. ? Cuando coma afuera, elija alimentos saludables. ? Aprenda a leer las etiquetas de los alimentos. Esto le ayudar a entender cunta comida se considera una porcin. ? Aprenda cul es el tamao de una porcin saludable. ? Tenga a mano colaciones con bajo contenido de grasas. ? Limite las bebidas azucaradas, como refrescos, jugo de frutas, t helado endulzado y [place] saborizada.  Beba suficiente agua para mantener la orina de color amarillo plido.  No siga una dieta de [place]. Las dietas de moda pueden ser poco saludables e incluso peligrosas. Actividad fsica  Realice ejercicio con regularidad como se lo haya indicado el mdico. ? La [employer] de los adultos deben hacer hasta 173mnutos de ejercicio de intensidad moderada cada semana. ? Consulte al mdico qu tipo de ejercicios es seguro para usted y con qu frecuencia debe ejercitarse.  Precaliente y elongue adecuadamente antes de hacer actividad fsica.  Reljese y elongue despus de hacer actividad fsica.  Descanse entre los perodos de aRomeo Estilo de vida  Trabaje con el mdico y un nutricionista para eHealth visitoruna meta de prdida de peso que sea saludable y razonable para usted.  Limite el tiempo que pasa frente a una pantalla.  Busque formas de recompensarse que no incluyan alimentos.  No beba alcohol  si: ? Su mdico le indica no hacerlo. ? Est embarazada, puede estar embarazada o est tratando de quedar embarazada.  Si bebe alcohol: ? Limite la cantidad que bebe a lo siguiente:  De 0 a 1 medida por da para las mujeres.  De 0 a 2 medidas por da para los hombres. ? Est atento a la cantidad de alcohol que hay en las bebidas que toma. En los [place]or una medida equivale a una botella de cerveza de 12oz (3510m, un vaso de vino de 5oz (148106mo un vaso de una bebida alcohlica de alta graduacin de 1oz (27m77mInstrucciones generales  Registre la prdida de peso en un diario para realOptometristseguimiento de los alimentos que consume y cunto se ejerPsychologist, forensicome los medicamentos de venta libre y los recetados solamente como se lo haya indicado el mdicMaplewoodaminas y suplementos solamente como se lo haya indicado el mdico.  Considere la posibilidad de partChief Financial Officerun grupo de apoyo. El mdico podra recomendarle un grupo de apoyo.  Concurra a todas las visitas de seguimiento como se lo haya indicado el mdico. Esto es importante. Comunquese con un mdico si:  No puede alcanzar su objetivo de prdida de peso despus de 6 semanas de cambios en la dieta y en el estilo de vida. Solicite ayuda inmediatamente si tiene:  Dificultad para respirar.  Pensamientos o conductas suicidas. Resumen  La obesidad es una afeccin que implica tener demasiada grasa corporal total.  Tener sobrepeso u obesidad significa que el peso es mayor que lo que se considera saludable para el tamao corporal.  [employer] el mdico y un nutricionista para establecer una meta de prdida de peso que sea saludable y razonable para usted.  Realice ejercicio con regularidad como se lo haya indicado el mdico. Consulte al mdico qu tipo de ejercicios es seguro para usted y con qu frecuencia debe ejercitarse. Esta informacin no tiene comoMarine scientistconsejo del mdico. Asegrese de  hacerle al mdico cualquier pregunta que tenga. Document Revised: 10/11/2017 Document Reviewed: 10/11/2017 Elsevier Patient Education  2020 [place] para Sports coach de peso Exercising to [employer] El ejercicio es la actividad fsica estructurada y repetitiva que se realiza para mejorar el [place] fsico y Technical sales engineer. Hacer ejercicio de forma regular es importante para todos. Es especialmente importante si tiene sobrepeso. El sobrepeso aumenta el riesgo de tener enfermedad cardaca, accidente cerebrovascular, diabetes, presin arterial alta y varios tipos de cncer. Reducir la ingesta de caloras y hacer ejercicio pueden ayudarlo a bajar de [place]. El ejercicio por lo general se clasifica como de intensidad moderada o vigorosa. Para bajar de peso, la [employer] de las personas debe hacer una determinada cantidad de ejercicio de intensidad moderada o vigorosa cada semana. Ejercicio de intensidad moderada  El ejercicio de intensidad moderada es cualquier actividad que lo haga moverse lo suficiente como para quemar al menos tres veces ms energa (caloras) que si estuviera sentado. Algunos ejemplos de ejercicio de intensidad moderada son:  Caminar una milla (1,6 kilmetros) en 15 minutos.  Hacer trabajos de jardinera livianos.  Andar en bicicleta a un ritmo fcil de aguantar. La [employer] de las personas debe hacer al menos 150 minutos (2 horas y 30 minutos) por semana de ejercicio de intensidad moderada para Theatre manager su Engineer, site. Ejercicio de intensidad vigorosa El ejercicio de intensidad vigorosa es cualquier actividad que lo haga moverse lo suficiente como para quemar al menos seis veces ms caloras que si estuviera sentado. Al hacer ejercicio a esta intensidad, su nivel de esfuerzo debera ser lo suficientemente alto como para no permitirle Programmer, systems. Algunos ejemplos de ejercicio de intensidad vigorosa son:  Optometrist.  Practicar un deporte de equipo, como  ftbol americano, baloncesto y ftbol.  Saltar la cuerda. La [employer] de las personas debe hacer al menos 75 minutos (1 hora y 15 minutos) por semana de ejercicio de intensidad vigorosa para mantener su Engineer, site. Cmo puede afectarme el ejercicio? Cuando hace suficiente ejercicio como para quemar ms caloras que las que consume, pierde [place]. Tambin reduce la grasa corporal y aumenta la masa muscular. Cuanto ms msculo tenga, mayor cantidad de Nurse, children's. El ejercicio tambin:  Mejora el estado de nimo.  Reduce el estrs y las tensiones.  Mejora el estado fsico general, la flexibilidad y la resistencia.  Aumenta la fuerza sea. La cantidad de ejercicio que necesita realizar para bajar de peso depende de:  Su edad.  El tipo de ejercicio.  Cualquier afeccin de [employer].  Su capacidad fsica general. Pregntele al mdico cunto ejercicio debe realizar y qu tipos de actividades son seguras para usted. Qu medidas puedo tomar para bajar de peso? Nutricin   [employer] dieta como se lo haya indicado el mdico o especialista en alimentacin y nutricin (nutricionista). Esto puede incluir lo siguiente: ? Consumir menos caloras. ? Consumir ms protenas. ? Consumir menos grasas no saludables. ? Seguir una dieta que incluya frutas y verduras frescas, cereales integrales, productos lcteos semidescremados y Advertising account planner. ? Evite los alimentos con grasa, sal y azcar agregadas.  Beba gran cantidad de agua mientras hace ejercicio para evitar la deshidratacin o los golpes de Freight forwarder. Actividad  Elija una actividad que disfrute y establezca objetivos realistas. El mdico puede ayudarlo a Paediatric nurse un plan de ejercicio que funcione para usted.  Haga ejercicio a una intensidad moderada o vigorosa la [employer] de la [place]. ? La intensidad de la  actividad fsica puede variar de Ardelia Mems persona a otra. Puede saber qu tan intensa una rutina de  ejercicios es para usted al Sales promotion account executive atencin a su respiracin y latidos cardacos. La [employer] de las personas notar que su respiracin y latidos cardacos se aceleran al Optometrist ejercicio de mayor intensidad.  Haga entrenamiento de resistencia dos veces por semana, como: ? Flexiones de [employer]. ? Abdominales. ? Levantamiento de pesas. ? Uso de bandas elsticas de resistencia.  Hacer ejercicio en perodos cortos de [employer] ser tan til como los perodos largos y estructurados de ejercicio. Si tiene dificultad para encontrar tiempo para Engineer, site, trate de incluir el ejercicio en su rutina diaria. ? Levntese, estrese y camine cada 69mnutos a lo largo del dTraining and development officer ? Vaya a caminar durante su hora de almuerzo. ? Estacione el auto lejos de su lugar de destino. ? Si uCanadatransporte pblico, bjese una parada antes y camine el resto del camino. ? Pngase de pie y camine cada vez que hable por telfono. ? Utilice la eWriterdel ascensor o la eCivil engineer, contracting  Use ropa cmoda y calzado con buen soporte.  No haga ejercicio en exceso que pudiera hacer que se lastime, se sienta mareado o tenga dificultad para respirar. Dnde buscar ms informacin  Departamento de Salud y Servicios Humanos de los Estados Unidos (U.S. Department of Health and HCoca Cola: wBondedCompany.at Centros para el Control y la Prevencin de EProbation officerfor Disease Control and Prevention, CDC): whttp://www.wolf.info/Comunquese con un mdico:  Antes de comenzar un nuevo programa de ejercicios.  Si tiene preguntas o inquietudes acerca de su peso.  Si tiene un problema mdico que lProducer, television/film/video Obtenga ayuda de inmediato si presenta alguno de los siguientes problemas al hacer ejercicio:  Lesiones.  Mareos.  Dificultad para respirar o falta de aire que no desaparecen al dejar de hacer ejercicio.  Dolor en el pecho.  Latidos cardacos rpidos. Resumen  El sobrepeso aumenta el riesgo  de tener enfermedad cardaca, accidente cerebrovascular, diabetes, presin arterial alta y varios tipos de cncer.  Para perder peso debe quemar ms caloras que las que consume.  Reducir la cantidad de caloras que consume, adems de hacer ejercicio de intensidad moderada o vigorosa todas las sDraper aSaint Helenaa pAdministrator, Civil Service Esta informacin no tiene cMarine scientistel consejo del mdico. Asegrese de hacerle al mdico cualquier pregunta que tenga. Document Revised: 03/16/2017 Document Reviewed: 03/16/2017 Elsevier Patient Education  2[place]

## 2019-06-05 MED ORDER — PRAVASTATIN SODIUM 40 MG PO TABS: 40.0000 mg | ORAL_TABLET | Freq: Every day | ORAL | 3 refills | Status: DC

## 2019-06-05 NOTE — Addendum Note (Signed)
Addended by: Jon Billings on: 06/05/2019 09:57 AM   Modules accepted: Orders

## 2019-06-16 ENCOUNTER — Ambulatory Visit: Payer: Medicare Other | Attending: Internal Medicine

## 2019-06-16 DIAGNOSIS — Z20822 Contact with and (suspected) exposure to covid-19: Secondary | ICD-10-CM

## 2019-06-17 LAB — SARS-COV-2, NAA 2 DAY TAT

## 2019-06-17 LAB — NOVEL CORONAVIRUS, NAA: SARS-CoV-2, NAA: NOT DETECTED

## 2019-07-10 ENCOUNTER — Telehealth: Payer: Self-pay | Admitting: Family Medicine

## 2019-07-10 NOTE — Progress Notes (Signed)
  Chronic Care Management   Note  07/10/2019 Name: Julie Dougherty MRN: [id number] DOB: 12-15-1950  [place] is a 69 y.o. year old female who is a primary care patient of [name], MD. I reached out to Julie Dougherty by phone today in response to a referral sent by Julie Dougherty's PCP, [name], MD.   Julie Dougherty was given information about Chronic Care Management services today including:  1. CCM service includes personalized support from designated clinical staff supervised by her physician, including individualized plan of care and coordination with other care providers 2. 24/7 contact phone numbers for assistance for urgent and routine care needs. 3. Service will only be billed when office clinical staff spend 20 minutes or more in a month to coordinate care. 4. Only one practitioner may furnish and bill the service in a calendar month. 5. The patient may stop CCM services at any time (effective at the end of the month) by phone call to the office staff.   Patient agreed to services and verbal consent obtained.   This note is not being shared with the patient for the following reason: To respect privacy (The patient or proxy has requested that the information not be shared). Follow up plan:   Earney [place] Upstream Scheduler

## 2019-08-01 ENCOUNTER — Encounter: Payer: Medicare Other | Admitting: Internal Medicine

## 2019-08-11 NOTE — Addendum Note (Signed)
Addended by: Lynda Rainwater on: 08/11/2019 09:57 AM   Modules accepted: Orders

## 2019-08-11 NOTE — Chronic Care Management (AMB) (Deleted)
 Chronic Care Management Pharmacy  Name: Julie Dougherty  MRN: [id number] DOB: 07/14/1950   Chief Complaint/ HPI  Julie Dougherty,  69 y.o. , female presents for their Initial CCM visit with the clinical pharmacist via telephone due to COVID-19 Pandemic.  PCP : Kremer, William Alfred, MD Patient Care Team: Kremer, William Alfred, MD as PCP - General (Family Medicine) McPherson, Barbara B, MD (Inactive) (Family Medicine) Gessner, Carl E, MD as Consulting Physician (Gastroenterology) Fleury, Alexandre A, RPH as Pharmacist (Pharmacist)  Their chronic conditions include: Hypertension, Hyperlipidemia and GERD   Office Visits: 06/04/19: Patient presented to Dr. Kremer for follow-up. Patient no longer taking hydrocortisone or tramadol. Pravastatin increased to 40 mg daily. Patient received Tdap. Patient referred to gynecology and sleep studies.   Consult Visit: 03/31/19: Patient presented to Dr. Groat (ophth) for follow-up.  03/21/19: Patient admitted to [facility] for hemorrhoidectomy  02/26/19: Patient presented to ED for lower abdominal pain. Patient referred to [facility] [place] [facility] for hemorrhoid consult.  Allergies  Allergen Reactions  . Ciprofloxacin Hives and Nausea Only  . Flagyl [Metronidazole] Hives and Nausea Only    Medications: Outpatient Encounter Medications as of 08/12/2019  Medication Sig  . amLODipine-benazepril (LOTREL) 5-20 MG capsule Take 1 capsule by mouth daily.  . Ascorbic Acid (VITAMIN C) 1000 MG tablet Take 1,000 mg by mouth daily.  . aspirin EC 81 MG tablet Take 81 mg by mouth daily.  . cholecalciferol (VITAMIN D3) 25 MCG (1000 UNIT) tablet Take 1,000 Units by mouth daily. Unsure of dosage  . omega-3 fish oil (MAXEPA) 1000 MG CAPS capsule Take 1 mg by mouth.  . pantoprazole (PROTONIX) 40 MG tablet Take 1 tablet (40 mg total) by mouth daily.  . polyethylene glycol powder (MIRALAX) powder Take 17 g by mouth daily.  .  pravastatin (PRAVACHOL) 40 MG tablet Take 1 tablet (40 mg total) by mouth daily.   No facility-administered encounter medications on file as of 08/12/2019.     Current Diagnosis/Assessment:    Goals Addressed   None    Hypertension   BP goal is:  {CHL HP UPSTREAM Pharmacist BP ranges:2109141006}  Office blood pressures are  BP Readings from Last 3 Encounters:  06/04/19 118/76  03/21/19 113/77  02/26/19 125/80   Patient checks BP at home {CHL HP BP Monitoring Frequency:2109141004} Patient home BP readings are ranging: ***  Patient has failed these meds in the past: *** Patient is currently {CHL Controlled/Uncontrolled:2109141014} on the following medications:  . Amlodipine-benazepril 5-20 mg daily   We discussed {CHL HP Upstream Pharmacy discussion:2109141007}  Plan  Continue {CHL HP Upstream Pharmacy Plans:2109141003}   Hyperlipidemia   LDL goal < ***  Lipid Panel     Component Value Date/Time   CHOL 199 06/04/2019 1102   TRIG 144.0 06/04/2019 1102   HDL 55.10 06/04/2019 1102   LDLCALC 115 (H) 06/04/2019 1102   LDLDIRECT 114.0 06/04/2019 1102    Hepatic Function Latest Ref Rng & Units 06/04/2019 07/16/2018 03/26/2018  Total Protein 6.0 - 8.3 g/dL 7.3 7.6 8.0  Albumin 3.5 - 5.2 g/dL 4.2 4.2 4.3  AST 0 - 37 U/L 14 14 15  ALT 0 - 35 U/L 15 15 17  Alk Phosphatase 39 - 117 U/L 69 70 85  Total Bilirubin 0.2 - 1.2 mg/dL 0.5 0.4 0.4  Bilirubin, Direct 0.0 - 0.3 mg/dL - - 0.0     The 10-year ASCVD risk score (Goff DC Jr., et al., 2013) is: 8.7%     Values used to calculate the score:     Age: 75 years     Sex: Female     Is Non-Hispanic African American: No     Diabetic: No     Tobacco smoker: No     Systolic Blood Pressure: 893 mmHg     Is BP treated: Yes     HDL Cholesterol: 55.1 mg/dL     Total Cholesterol: 199 mg/dL   Patient has failed these meds in past: *** Patient is currently {CHL Controlled/[phone]} on the following medications:   . Aspirin 81 mg daily  . Pravastatin 40 mg daily  . Omega-3 Fish Oil 1000 mg   We discussed:  {CHL HP Upstream Pharmacy discussion:[phone]}  Plan  Continue {CHL HP Upstream Pharmacy Plans:[phone]}  GERD   Patient has failed these meds in past: *** Patient is currently {CHL Controlled/[phone]} on the following medications:  . Pantoprazole 40 mg daily   We discussed:  ***  Plan  Continue {CHL HP Upstream Pharmacy Plans:[phone]}  Misc / OTC    Patient has failed these meds in past: *** Patient is currently {CHL Controlled/[phone]} on the following medications:  Marland Kitchen Vitamin C 1000 mg daily  . Vitamin D3 1000 units daily  . Miralax 17g daily   We discussed:  ***  Plan  Continue {CHL HP Upstream Pharmacy TDSKA:[id number]}  Vaccines   Reviewed and discussed patient's vaccination history.    Immunization History  Administered Date(s) Administered  . Influenza,inj,Quad PF,6+ Mos 12/01/2015  . Influenza-Unspecified 01/30/2018  . PFIZER SARS-COV-2 Vaccination 04/03/2019, 05/06/2019  . Tdap 06/04/2019    Plan  Recommended patient receive *** vaccine in *** office.   Medication Management   Pt uses [facility] for all medications. No external fill history available. Uses pill box? {Yes or If no, why not?:20788} Pt endorses ***% compliance  We discussed: ***  Plan  {[facility] Pharmacy MBTD:97416}    Follow up: *** month phone visit  ***

## 2019-08-12 ENCOUNTER — Telehealth: Payer: Medicare Other

## 2019-08-26 ENCOUNTER — Telehealth: Payer: Self-pay | Admitting: Internal Medicine

## 2019-08-26 NOTE — Telephone Encounter (Signed)
Patient daughter in law calling states patient has abdominal pain and she can not even touch the area sensitive to touch extremely painful

## 2019-08-26 NOTE — Telephone Encounter (Signed)
Patient's daughter-in-law reports abdominal pain RUQ.  Very tender especially to movement and palpation.  She reports that this is the same pain she had last summer.  Pain has been present for approximately 2 weeks and getting worse. She is asked to have her try Tylenol for the pain, can use an OTC Aspercream to the area, and a heating pad.  I scheduled her for follow up on 09/08/19 with Amy Esterwood PA. Dr. Carlean Purl per CT scan last year if pain returned you were going to prescribe something.  Please advise any rx until OV with Nicoletta Ba PA

## 2019-08-27 MED ORDER — CYCLOBENZAPRINE HCL 5 MG PO TABS: 5.0000 mg | ORAL_TABLET | Freq: Three times a day (TID) | ORAL | 0 refills | Status: DC | PRN

## 2019-08-27 NOTE — Telephone Encounter (Signed)
I rxed cyclobenzaprine last year and Rxed it again

## 2019-08-27 NOTE — Telephone Encounter (Signed)
Patients daughter in law notified.

## 2019-08-28 ENCOUNTER — Ambulatory Visit: Payer: Medicare Other

## 2019-09-02 ENCOUNTER — Other Ambulatory Visit: Payer: Self-pay

## 2019-09-02 ENCOUNTER — Other Ambulatory Visit: Payer: Medicare Other

## 2019-09-02 DIAGNOSIS — Z20822 Contact with and (suspected) exposure to covid-19: Secondary | ICD-10-CM | POA: Diagnosis not present

## 2019-09-03 ENCOUNTER — Ambulatory Visit: Payer: Medicare Other

## 2019-09-03 ENCOUNTER — Other Ambulatory Visit: Payer: Self-pay

## 2019-09-03 LAB — NOVEL CORONAVIRUS, NAA: SARS-CoV-2, NAA: NOT DETECTED

## 2019-09-03 LAB — SARS-COV-2, NAA 2 DAY TAT

## 2019-09-03 NOTE — Progress Notes (Deleted)
Subjective:   [place] is a 69 y.o. female who presents for an Initial Medicare Annual Wellness Visit.  Review of Systems           Objective:    There were no vitals filed for this visit. There is no height or weight on file to calculate BMI.  Advanced Directives 03/21/2019 03/17/2019 04/09/2017 03/07/2017 02/28/2017 10/20/2016 10/05/2016  Does Patient Have a Medical Advance Directive? No No No No No No No  Type of Advance Directive - - - - - - -  Would patient like information on creating a medical advance directive? No - Patient declined No - Patient declined - No - Patient declined No - Patient declined - -    Current Medications (verified) Outpatient Encounter Medications as of 09/03/2019  Medication Sig  . amLODipine-benazepril (LOTREL) 5-20 MG capsule Take 1 capsule by mouth daily.  . Ascorbic Acid (VITAMIN C) 1000 MG tablet Take 1,000 mg by mouth daily.  Marland Kitchen aspirin EC 81 MG tablet Take 81 mg by mouth daily.  . cholecalciferol (VITAMIN D3) 25 MCG (1000 UNIT) tablet Take 1,000 Units by mouth daily. Unsure of dosage  . cyclobenzaprine (FLEXERIL) 5 MG tablet Take 1 tablet (5 mg total) by mouth 3 (three) times daily as needed for muscle spasms.  Marland Kitchen omega-3 fish oil (MAXEPA) 1000 MG CAPS capsule Take 1 mg by mouth.  . pantoprazole (PROTONIX) 40 MG tablet Take 1 tablet (40 mg total) by mouth daily.  . polyethylene glycol powder (MIRALAX) powder Take 17 g by mouth daily.  . pravastatin (PRAVACHOL) 40 MG tablet Take 1 tablet (40 mg total) by mouth daily.   No facility-administered encounter medications on file as of 09/03/2019.    Allergies (verified) Ciprofloxacin and Flagyl [metronidazole]   History: Past Medical History:  Diagnosis Date  . Arthritis   . Cataract   . Chronic constipation   . Diverticulitis   . Diverticulosis   . Fatty liver   . GERD (gastroesophageal reflux disease)   . Hemorrhoids, internal, with bleeding Gr 2 prolapsed 06/30/2011   Found on  colonoscopy 2011 and anoscopy May 2013   . Hepatitis    "B"    no rx >10 yrs  . Hiatal hernia   . High cholesterol   . Hypertension   . Internal hemorrhoids   . OSA (obstructive sleep apnea)    does not use CPAP  . Pre-diabetes   . PUD (peptic ulcer disease)   . Rectal ulceration 04/27/2009   Associated reactive/regenerative changes and fibromuscular extensions into lamina propria/Mucosal Prolapse  . Sleep apnea    Waiting on CPAP machine   Past Surgical History:  Procedure Laterality Date  . ABDOMINAL HYSTERECTOMY    . CATARACT EXTRACTION Right 03/2018  . COLONOSCOPY  04/27/09    POLYPOID FRAGMENT OF COLONIC MUCOSA WITH SURFACE  . EVALUATION UNDER ANESTHESIA WITH HEMORRHOIDECTOMY N/A 03/21/2019   Procedure: OPEN HEMORRHOIDECTOMY;  Surgeon: Armandina Gemma, MD;  Location: [place];  Service: General;  Laterality: N/A;  . HEMORRHOID BANDING    . TOTAL KNEE ARTHROPLASTY Left 08/29/2016   Procedure: LEFT TOTAL KNEE ARTHROPLASTY;  Surgeon: Mcarthur Rossetti, MD;  Location: [place];  Service: Orthopedics;  Laterality: Left;  . TOTAL KNEE ARTHROPLASTY Right 03/06/2017   Procedure: RIGHT TOTAL KNEE ARTHROPLASTY;  Surgeon: Mcarthur Rossetti, MD;  Location: [place];  Service: Orthopedics;  Laterality: Right;  . TUBAL LIGATION     Family History  Problem Relation Age  of Onset  . Diabetes Sister   . Asthma Sister   . Hyperlipidemia Sister   . Hypertension Sister   . Colon cancer Father 39  . Cancer Father   . Arthritis Mother   . Hypertension Mother   . Depression Sister   . Mental illness Sister   . Esophageal cancer Neg Hx   . Pancreatic cancer Neg Hx   . Stomach cancer Neg Hx    Social History   Socioeconomic History  . Marital status: Single    Spouse name: Not on file  . Number of children: 3  . Years of education: Not on file  . Highest education level: Not on file  Occupational History  . Not on file  Tobacco Use  . Smoking status: Former Research scientist (life sciences)   . Smokeless tobacco: Never Used  . 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 Vaping Use: Never used  Substance and Sexual Activity  . Alcohol use: No    Comment: seldom  . Drug use: No  . Sexual activity: Not on file  Other Topics Concern  . Not on file  Social History Narrative  . Not on file   Social Determinants of Health   Financial Resource Strain:   . Difficulty of Paying Living Expenses:   Food Insecurity:   . Worried About Charity fundraiser in the Last Year:   . Arboriculturist in the Last Year:   Transportation Needs:   . Film/video editor (Medical):   Marland Kitchen Lack of Transportation (Non-Medical):   Physical Activity:   . Days of Exercise per Week:   . Minutes of Exercise per Session:   Stress:   . Feeling of Stress :   Social Connections:   . Frequency of Communication with Friends and Family:   . Frequency of Social Gatherings with Friends and Family:   . Attends Religious Services:   . Active Member of Clubs or Organizations:   . Attends Archivist Meetings:   Marland Kitchen Marital Status:     Tobacco Counseling Counseling given: Not Answered Comment: "when she was young"   Clinical Intake:                 Diabetic?No         Activities of Daily Living In your present state of health, do you have any difficulty performing the following activities: 03/21/2019  Hearing? N  Vision? N  Difficulty concentrating or making decisions? N  Walking or climbing stairs? N  Dressing or bathing? N  Some recent data might be hidden    Patient Care Team: [name], MD as PCP - General (Family Medicine) Barton Fanny, MD (Inactive) (Family Medicine) [name], MD as Consulting Physician (Gastroenterology) [name], [facility] as Pharmacist (Pharmacist)  Indicate any recent Medical Services you may have received from other than [facility] providers in the past year (date may be approximate).     Assessment:     This is a routine wellness examination for [place].  Hearing/Vision screen No exam data present  Dietary issues and exercise activities discussed:    Goals   None    Depression Screen PHQ 2/9 Scores 06/04/2019 06/04/2019 06/18/2017 10/21/2015  PHQ - 2 Score 0 0 0 0  PHQ- 9 Score 1 - - -    Fall Risk Fall Risk  06/04/2019 06/18/2017 10/21/2015  Falls in the past year? 0 No No    Any stairs  in or around the home? {YES/NO:21197} If so, are there any without handrails? {YES/NO:21197} Home free of loose throw rugs in walkways, pet beds, electrical cords, etc? {YES/NO:21197} Adequate lighting in your home to reduce risk of falls? {YES/NO:21197}  ASSISTIVE DEVICES UTILIZED TO PREVENT FALLS:  Life alert? {YES/NO:21197} Use of a cane, walker or w/c? {YES/NO:21197} Grab bars in the bathroom? {YES/NO:21197} Shower chair or bench in shower? {YES/NO:21197} Elevated toilet seat or a handicapped toilet? {YES/NO:21197}  TIMED UP AND GO:  Was the test performed? {YES/NO:21197}.  Length of time to ambulate 10 feet: *** sec.   {Appearance of IHWT:8882800}  Cognitive Function:        Immunizations Immunization History  Administered Date(s) Administered  . Influenza,inj,Quad PF,6+ Mos 12/01/2015  . Influenza-Unspecified 01/30/2018  . PFIZER SARS-COV-2 Vaccination 04/03/2019, 05/06/2019  . Tdap 06/04/2019    TDAP status: Up to date   Flu Vaccine status: Up to date   {Pneumococcal vaccine status:2101807}   Covid-19 vaccine status: Completed vaccines  Qualifies for Shingles Vaccine? {YES/NO:21197}  Zostavax completed {YES/NO:21197}  {Shingrix Completed?:2101804}  Screening Tests Health Maintenance  Topic Date Due  . DEXA SCAN  Never done  . PNA vac Low Risk Adult (1 of 2 - PCV13) Never done  . COLONOSCOPY  04/28/2019  . INFLUENZA VACCINE  08/31/2019  . MAMMOGRAM  03/11/2021  . TETANUS/TDAP  06/03/2029  . COVID-19 Vaccine  Completed  . Hepatitis C Screening  Completed     Health Maintenance  Health Maintenance Due  Topic Date Due  . DEXA SCAN  Never done  . PNA vac Low Risk Adult (1 of 2 - PCV13) Never done  . COLONOSCOPY  04/28/2019  . INFLUENZA VACCINE  08/31/2019    {Colorectal cancer screening:2101809}   Mammogram status: Completed 03/12/2019. Repeat every year   {Bone Density status:21018021}  Lung Cancer Screening: (Low Dose CT Chest recommended if Age 74-80 years, 30 pack-year currently smoking OR have quit w/in 15years.) does not qualify.     Additional Screening:  Hepatitis C Screening:  Completed 03/26/2018  Vision Screening: Recommended annual ophthalmology exams for early detection of glaucoma and other disorders of the eye. Is the patient up to date with their annual eye exam?  {YES/NO:21197} Who is the provider or what is the name of the office in which the patient attends annual eye exams? *** If pt is not established with a provider, would they like to be referred to a provider to establish care? {YES/NO:21197}.   Dental Screening: Recommended annual dental exams for proper oral hygiene  Community Resource Referral / Chronic Care Management: CRR required this visit?  {YES/NO:21197}  CCM required this visit?  {YES/NO:21197}     Plan:     I have personally reviewed and noted the following in the patient's chart:   . Medical and social history . Use of alcohol, tobacco or illicit drugs  . Current medications and supplements . Functional ability and status . Nutritional status . Physical activity . Advanced directives . List of other physicians . Hospitalizations, surgeries, and ER visits in previous 12 months . Vitals . Screenings to include cognitive, depression, and falls . Referrals and appointments  In addition, I have reviewed and discussed with patient certain preventive protocols, quality metrics, and best practice recommendations. A written personalized care plan for preventive services as well as  general preventive health recommendations were provided to patient.     Marta Antu, LPN   04/02/9177  Nurse Health Advisor  Nurse Notes: ***

## 2019-09-08 ENCOUNTER — Ambulatory Visit (INDEPENDENT_AMBULATORY_CARE_PROVIDER_SITE_OTHER)
Admission: RE | Admit: 2019-09-08 | Discharge: 2019-09-08 | Disposition: A | Discharge status: Home / Self Care | Payer: Medicare Other | Source: Ambulatory Visit | Attending: Physician Assistant | Admitting: Physician Assistant

## 2019-09-08 ENCOUNTER — Encounter: Payer: Self-pay | Admitting: Physician Assistant

## 2019-09-08 ENCOUNTER — Other Ambulatory Visit: Payer: Self-pay

## 2019-09-08 ENCOUNTER — Ambulatory Visit (INDEPENDENT_AMBULATORY_CARE_PROVIDER_SITE_OTHER): Payer: Medicare Other | Admitting: Physician Assistant

## 2019-09-08 ENCOUNTER — Other Ambulatory Visit: Payer: Medicare Other

## 2019-09-08 ENCOUNTER — Other Ambulatory Visit: Payer: Self-pay | Admitting: Physician Assistant

## 2019-09-08 VITALS — BP 102/70 | HR 80 | Ht 60.5 in | Wt 198.5 lb

## 2019-09-08 DIAGNOSIS — M546 Pain in thoracic spine: Secondary | ICD-10-CM

## 2019-09-08 DIAGNOSIS — R1013 Epigastric pain: Secondary | ICD-10-CM | POA: Diagnosis not present

## 2019-09-08 DIAGNOSIS — R0789 Other chest pain: Secondary | ICD-10-CM

## 2019-09-08 DIAGNOSIS — M47814 Spondylosis without myelopathy or radiculopathy, thoracic region: Secondary | ICD-10-CM | POA: Diagnosis not present

## 2019-09-08 DIAGNOSIS — R0781 Pleurodynia: Secondary | ICD-10-CM | POA: Diagnosis not present

## 2019-09-08 DIAGNOSIS — Z1211 Encounter for screening for malignant neoplasm of colon: Secondary | ICD-10-CM | POA: Diagnosis not present

## 2019-09-08 DIAGNOSIS — R1011 Right upper quadrant pain: Secondary | ICD-10-CM | POA: Diagnosis not present

## 2019-09-08 DIAGNOSIS — M47812 Spondylosis without myelopathy or radiculopathy, cervical region: Secondary | ICD-10-CM | POA: Diagnosis not present

## 2019-09-08 MED ORDER — CYCLOBENZAPRINE HCL 5 MG PO TABS: 5.0000 mg | ORAL_TABLET | Freq: Three times a day (TID) | ORAL | 0 refills | Status: DC | PRN

## 2019-09-08 NOTE — Progress Notes (Signed)
Subjective:    Patient ID: Julie Dougherty, female    DOB: May 11, 1950, 69 y.o.   MRN: [id number]  Julie Dougherty is a pleasant 69 year old Hispanic female from [place] established with Dr. Carlean Purl, who comes in today with complaints of progressive right upper quadrant pain and epigastric pain.  She says that she has had some pain off and on over the past year, but that over the past 2 months the pain has been worse and has become constant.  She describes right upper quadrant pain that radiates around into her back.  She describes a gassy feeling, and intermittent burning sensation.  She has been on omeprazole 40 mg p.o. daily.  Weight has been stable, appetite okay.  She is having regular bowel movements, no melena or hematochezia.  She is able to eat but says she gets full a bit faster, no nausea or vomiting.  Sometimes she does feel worse with certain movements or rolling over in bed. She was called in a trial of cyclobenzaprine on 08/26/2019 and says that has helped a little bit but has not at all alleviated the pain.  She has not had any known injury. She had undergone CT of the abdomen and pelvis in June 2020 for complaints of epigastric pain and that was a negative exam.  She was noted to have hiatal hernia and diverticulosis. She has not had prior EGD.  Colonoscopy was done in 2011 which showed sigmoid diverticulosis, no polyps.  She did have an irregularity at the anorectal interface which was biopsied and showed benign polypoid colonic mucosa.  Review of Systems Pertinent positive and negative review of systems were noted in the above HPI section.  All other review of systems was otherwise negative.  Outpatient Encounter Medications as of 09/08/2019  Medication Sig  . amLODipine-benazepril (LOTREL) 5-20 MG capsule Take 1 capsule by mouth daily.  . Ascorbic Acid (VITAMIN C) 1000 MG tablet Take 1,000 mg by mouth daily.  Marland Kitchen aspirin EC 81 MG tablet Take 81 mg by mouth daily.    . cholecalciferol (VITAMIN D3) 25 MCG (1000 UNIT) tablet Take 1,000 Units by mouth daily. Unsure of dosage  . cyclobenzaprine (FLEXERIL) 5 MG tablet Take 1 tablet (5 mg total) by mouth 3 (three) times daily as needed for muscle spasms.  Marland Kitchen omega-3 fish oil (MAXEPA) 1000 MG CAPS capsule Take 1 mg by mouth.  . pantoprazole (PROTONIX) 40 MG tablet Take 1 tablet (40 mg total) by mouth daily.  . polyethylene glycol powder (MIRALAX) powder Take 17 g by mouth daily.  . pravastatin (PRAVACHOL) 40 MG tablet Take 1 tablet (40 mg total) by mouth daily.  . [DISCONTINUED] cyclobenzaprine (FLEXERIL) 5 MG tablet Take 1 tablet (5 mg total) by mouth 3 (three) times daily as needed for muscle spasms.   No facility-administered encounter medications on file as of 09/08/2019.   Allergies  Allergen Reactions  . Ciprofloxacin Hives and Nausea Only  . Flagyl [Metronidazole] Hives and Nausea Only   Patient Active Problem List   Diagnosis Date Noted  . Pain in both hands 06/04/2019  . Chest wall muscle strain 06/04/2019  . Sleep apnea 03/26/2018  . History of hepatitis B 03/26/2018  . Healthcare maintenance 03/26/2018  . 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 Status post total knee replacement, left 08/29/2016  . Baker's cyst of knee, left - suspected 05/30/2016  . Diverticulitis of colon 03/17/2016  . Hemorrhoids, internal, with bleeding Gr 2 prolapsed 06/30/2011  .  Diverticulitis 06/06/2011  . Rectal bleeding 04/19/2009  . ABDOMINAL PAIN, RIGHT LOWER QUADRANT 04/19/2009  . Elevated cholesterol 07/11/2006  . Essential hypertension 07/11/2006  . GERD 07/11/2006  . HEPATITIS B, HX OF 07/11/2006   Social History   Socioeconomic History  . Marital status: Single    Spouse name: Not on file  . Number of children: 3  . Years of education: Not on file  . Highest education level: Not on file  Occupational History  . Not on file  Tobacco Use  . Smoking status: Former Research scientist (life sciences)  . Smokeless  tobacco: Never Used  . 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 Vaping Use: Never used  Substance and Sexual Activity  . Alcohol use: No    Comment: seldom  . Drug use: No  . Sexual activity: Not on file  Other Topics Concern  . Not on file  Social History Narrative  . Not on file   Social Determinants of Health   Financial Resource Strain:   . Difficulty of Paying Living Expenses:   Food Insecurity:   . Worried About Charity fundraiser in the Last Year:   . Arboriculturist in the Last Year:   Transportation Needs:   . Film/video editor (Medical):   Marland Kitchen Lack of Transportation (Non-Medical):   Physical Activity:   . Days of Exercise per Week:   . Minutes of Exercise per Session:   Stress:   . Feeling of Stress :   Social Connections:   . Frequency of Communication with Friends and Family:   . Frequency of Social Gatherings with Friends and Family:   . Attends Religious Services:   . Active Member of Clubs or Organizations:   . Attends Archivist Meetings:   Marland Kitchen Marital Status:   Intimate Partner Violence:   . Fear of Current or Ex-Partner:   . Emotionally Abused:   Marland Kitchen Physically Abused:   . Sexually Abused:     [name]'s family history includes Arthritis in her mother; Asthma in her sister; Cancer in her father; Colon cancer (age of onset: 28) in her father; Depression in her sister; Diabetes in her sister; Hyperlipidemia in her sister; Hypertension in her mother and sister; Mental illness in her sister.      Objective:    Vitals:   09/08/19 1115  BP: 102/70  Pulse: 80    Physical Exam Well-developed well-nourished older Hispanic female in no acute distress.  Height, Weight 198, BMI 38.1 accompanied by her daughter-in-law who does interpreting  HEENT; nontraumatic normocephalic, EOMI, PER R LA, sclera anicteric. Oropharynx; not examined Neck; supple, no JVD Cardiovascular; regular rate and rhythm with S1-S2, no murmur  rub or gallop Pulmonary; Clear bilaterally there is tenderness along the right costal margin around laterally. Abdomen; soft, she is tender in the right upper quadrant, no guarding, nondistended, no palpable mass or hepatosplenomegaly, bowel sounds are active Rectal; not done today Skin; benign exam, no jaundice rash or appreciable lesions Extremities; no clubbing cyanosis or edema skin warm and dry Neuro/Psych; alert and oriented x4, grossly nonfocal mood and affect appropriate       Assessment & Plan:   Number one 69 year old Hispanic female with intermittent right upper quadrant epigastric pain over the past year or so, worse over the past 2 months and now constant. On exam a component of her pain does seem to be musculoskeletal i.e. costochondritis with tenderness along the right costal margin  and around laterally.  However she is also tender in the epigastrium and right upper quadrant. No evidence for gallbladder disease on CT 1 year ago, rule out chronic gastropathy duodenal apathy, peptic ulcer disease, neoplasm. #2 colon cancer screening-last colonoscopy 2011 due for follow-up 3.  Hypertension 4.  Status post bilateral knee replacements 5.  Prior history of hepatitis B  Plan patient will be scheduled for colonoscopy and EGD with Dr. Carlean Purl.  Procedures were discussed in detail with the patient including indications risks and benefits and she is agreeable to proceed. She has completed COVID-19 vaccination. Continue Protonix 40 mg p.o. every morning AC breakfast [employer] refill cyclobenzaprine 10 mg p.o. 3 times daily as needed. Also advised trial of OTC lidocaine gel 3-4 times daily over right costal margin. We will check thoracic spine films and right lower rib details.  Reiley Keisler Genia Harold PA-C 09/08/2019   Cc: [name],*

## 2019-09-08 NOTE — Patient Instructions (Addendum)
If you are age 68 or older, your body mass index should be between 23-30. Your Body mass index is 38.13 kg/m. If this is out of the aforementioned range listed, please consider follow up with your Primary Care Provider.  If you are age 44 or younger, your body mass index should be between 19-25. Your Body mass index is 38.13 kg/m. If this is out of the aformentioned range listed, please consider follow up with your Primary Care Provider.   You have been scheduled for an endoscopy and colonoscopy. Please follow the written instructions given to you at your visit today. Please pick up your prep supplies at the pharmacy within the next 1-3 days. If you use inhalers (even only as needed), please bring them with you on the day of your procedure.  Your provider has requested that you go to the basement level for X-rays before leaving today. Press "B" on the elevator. The imaging is located straight ahead after getting off the elevator.  Due to recent changes in healthcare laws, you may see the results of your imaging and laboratory studies on MyChart before your provider has had a chance to review them.  We understand that in some cases there may be results that are confusing or concerning to you. Not all laboratory results come back in the same time frame and the provider may be waiting for multiple results in order to interpret others.  Please give [place] 48 hours in order for your provider to thoroughly review all the results before contacting the office for clarification of your results.   Try OTC Lidocain gel for the right lower ribs and back 3 times a day.  A refill of Cyclobenzaprine has been sent to your pharmacy.  Follow up pending at this time.

## 2019-09-17 ENCOUNTER — Ambulatory Visit: Payer: Medicare Other

## 2019-09-17 ENCOUNTER — Other Ambulatory Visit: Payer: Self-pay

## 2019-09-17 NOTE — Progress Notes (Deleted)
Subjective:   Julie Dougherty is a 69 y.o. female who presents for an Initial Medicare Annual Wellness Visit.  Review of Systems    ***       Objective:    There were no vitals filed for this visit. There is no height or weight on file to calculate BMI.  Advanced Directives 03/21/2019 03/17/2019 04/09/2017 03/07/2017 02/28/2017 10/20/2016 10/05/2016  Does Patient Have a Medical Advance Directive? No No No No No No No  Type of Advance Directive - - - - - - -  Would patient like information on creating a medical advance directive? No - Patient declined No - Patient declined - No - Patient declined No - Patient declined - -    Current Medications (verified) Outpatient Encounter Medications as of 09/17/2019  Medication Sig  . amLODipine-benazepril (LOTREL) 5-20 MG capsule Take 1 capsule by mouth daily.  . Ascorbic Acid (VITAMIN C) 1000 MG tablet Take 1,000 mg by mouth daily.  Marland Kitchen aspirin EC 81 MG tablet Take 81 mg by mouth daily.  . cholecalciferol (VITAMIN D3) 25 MCG (1000 UNIT) tablet Take 1,000 Units by mouth daily. Unsure of dosage  . cyclobenzaprine (FLEXERIL) 5 MG tablet Take 1 tablet (5 mg total) by mouth 3 (three) times daily as needed for muscle spasms.  Marland Kitchen omega-3 fish oil (MAXEPA) 1000 MG CAPS capsule Take 1 mg by mouth.  . pantoprazole (PROTONIX) 40 MG tablet Take 1 tablet (40 mg total) by mouth daily.  . polyethylene glycol powder (MIRALAX) powder Take 17 g by mouth daily.  . pravastatin (PRAVACHOL) 40 MG tablet Take 1 tablet (40 mg total) by mouth daily.   No facility-administered encounter medications on file as of 09/17/2019.    Allergies (verified) Ciprofloxacin and Flagyl [metronidazole]   History: Past Medical History:  Diagnosis Date  . Arthritis   . Cataract   . Chronic constipation   . Diverticulitis   . Diverticulosis   . Fatty liver   . GERD (gastroesophageal reflux disease)   . Hemorrhoids, internal, with bleeding Gr 2 prolapsed 06/30/2011   Found  on colonoscopy 2011 and anoscopy May 2013   . Hepatitis    "B"    no rx >10 yrs  . Hiatal hernia   . High cholesterol   . Hypertension   . Internal hemorrhoids   . OSA (obstructive sleep apnea)    does not use CPAP  . Pre-diabetes   . PUD (peptic ulcer disease)   . Rectal ulceration 04/27/2009   Associated reactive/regenerative changes and fibromuscular extensions into lamina propria/Mucosal Prolapse  . Sleep apnea    Waiting on CPAP machine   Past Surgical History:  Procedure Laterality Date  . ABDOMINAL HYSTERECTOMY    . CATARACT EXTRACTION Right 03/2018  . COLONOSCOPY  04/27/09    POLYPOID FRAGMENT OF COLONIC MUCOSA WITH SURFACE  . EVALUATION UNDER ANESTHESIA WITH HEMORRHOIDECTOMY N/A 03/21/2019   Procedure: OPEN HEMORRHOIDECTOMY;  Surgeon: Armandina Gemma, MD;  Location: [place];  Service: General;  Laterality: N/A;  . HEMORRHOID BANDING    . 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 TOTAL KNEE ARTHROPLASTY Right 03/06/2017   Procedure: RIGHT TOTAL KNEE ARTHROPLASTY;  Surgeon: Mcarthur Rossetti, MD;  Location: [place];  Service: Orthopedics;  Laterality: Right;  . TUBAL LIGATION     Family History  Problem Relation Age  of Onset  . Diabetes Sister   . Asthma Sister   . Hyperlipidemia Sister   . Hypertension Sister   . Colon cancer Father 66  . Cancer Father   . Arthritis Mother   . Hypertension Mother   . Depression Sister   . Mental illness Sister   . Esophageal cancer Neg Hx   . Pancreatic cancer Neg Hx   . Stomach cancer Neg Hx    Social History   Socioeconomic History  . Marital status: Single    Spouse name: Not on file  . Number of children: 3  . Years of education: Not on file  . Highest education level: Not on file  Occupational History  . Not on file  Tobacco Use  . Smoking status: Former  Research scientist (life sciences)  . Smokeless tobacco: Never Used  . 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 Vaping Use: Never used  Substance and Sexual Activity  . Alcohol use: No    Comment: seldom  . Drug use: No  . Sexual activity: Not on file  Other Topics Concern  . Not on file  Social History Narrative  . Not on file   Social Determinants of Health   Financial Resource Strain:   . Difficulty of Paying Living Expenses:   Food Insecurity:   . Worried About Charity fundraiser in the Last Year:   . Arboriculturist in the Last Year:   Transportation Needs:   . Film/video editor (Medical):   Marland Kitchen Lack of Transportation (Non-Medical):   Physical Activity:   . Days of Exercise per Week:   . Minutes of Exercise per Session:   Stress:   . Feeling of Stress :   Social Connections:   . Frequency of Communication with Friends and Family:   . Frequency of Social Gatherings with Friends and Family:   . Attends Religious Services:   . Active Member of Clubs or Organizations:   . Attends Archivist Meetings:   Marland Kitchen Marital Status:     Tobacco Counseling Counseling given: Not Answered Comment: "when she was young"   Clinical Intake:                 Diabetic?***         Activities of Daily Living In your present state of health, do you have any difficulty performing the following activities: 03/21/2019  Hearing? N  Vision? N  Difficulty concentrating or making decisions? N  Walking or climbing stairs? N  Dressing or bathing? N  Some recent data might be hidden    Patient Care Team: [name], MD as PCP - General (Family Medicine) Barton Fanny, MD (Inactive) (Family Medicine) [name], MD as Consulting Physician (Gastroenterology) [name], [facility] as Pharmacist (Pharmacist)  Indicate any recent Medical Services you may have received from other than [facility] providers in the past year (date may be approximate).       Assessment:   This is a routine wellness examination for Julie Dougherty.  Hearing/Vision screen No exam data present  Dietary issues and exercise activities discussed:    Goals   None    Depression Screen PHQ 2/9 Scores 06/04/2019 06/04/2019 06/18/2017 10/21/2015  PHQ - 2 Score 0 0 0 0  PHQ- 9 Score 1 - - -    Fall Risk Fall Risk  06/04/2019 06/18/2017 10/21/2015  Falls in the past year? 0 No No    Any stairs  in or around the home? {YES/NO:21197} If so, are there any without handrails? {YES/NO:21197} Home free of loose throw rugs in walkways, pet beds, electrical cords, etc? {YES/NO:21197} Adequate lighting in your home to reduce risk of falls? {YES/NO:21197}  ASSISTIVE DEVICES UTILIZED TO PREVENT FALLS:  Life alert? {YES/NO:21197} Use of a cane, walker or w/c? {YES/NO:21197} Grab bars in the bathroom? {YES/NO:21197} Shower chair or bench in shower? {YES/NO:21197} Elevated toilet seat or a handicapped toilet? {YES/NO:21197}  TIMED UP AND GO:  Was the test performed? {YES/NO:21197}.  Length of time to ambulate 10 feet: *** sec.   {Appearance of TTSV:7793903}  Cognitive Function:        Immunizations Immunization History  Administered Date(s) Administered  . Influenza,inj,Quad PF,6+ Mos 12/01/2015  . Influenza-Unspecified 01/30/2018  . PFIZER SARS-COV-2 Vaccination 04/03/2019, 05/06/2019  . Tdap 06/04/2019    {TDAP status:2101805} {Flu Vaccine status:2101806} {Pneumococcal vaccine status:2101807} {Covid-19 vaccine status:2101808}  Qualifies for Shingles Vaccine? {YES/NO:21197}  Zostavax completed {YES/NO:21197}  {Shingrix Completed?:2101804}  Screening Tests Health Maintenance  Topic Date Due  . DEXA SCAN  Never done  . PNA vac Low Risk Adult (1 of 2 - PCV13) Never done  . COLONOSCOPY  04/28/2019  . INFLUENZA VACCINE  08/31/2019  . MAMMOGRAM  03/11/2021  . TETANUS/TDAP  06/03/2029  . COVID-19 Vaccine  Completed  . Hepatitis C Screening  Completed    Health  Maintenance  Health Maintenance Due  Topic Date Due  . DEXA SCAN  Never done  . PNA vac Low Risk Adult (1 of 2 - PCV13) Never done  . COLONOSCOPY  04/28/2019  . INFLUENZA VACCINE  08/31/2019    {Colorectal cancer screening:2101809} {Mammogram status:21018020} {Bone Density status:21018021}  Lung Cancer Screening: (Low Dose CT Chest recommended if Age 32-80 years, 30 pack-year currently smoking OR have quit w/in 15years.) {DOES NOT does:27190::"does not"} qualify.   Lung Cancer Screening Referral: ***  Additional Screening:  Hepatitis C Screening: {DOES NOT does:27190::"does not"} qualify; Completed ***  Vision Screening: Recommended annual ophthalmology exams for early detection of glaucoma and other disorders of the eye. Is the patient up to date with their annual eye exam?  {YES/NO:21197} Who is the provider or what is the name of the office in which the patient attends annual eye exams? *** If pt is not established with a provider, would they like to be referred to a provider to establish care? {YES/NO:21197}.   Dental Screening: Recommended annual dental exams for proper oral hygiene  Community Resource Referral / Chronic Care Management: CRR required this visit?  {YES/NO:21197}  CCM required this visit?  {YES/NO:21197}     Plan:     I have personally reviewed and noted the following in the patient's chart:   . Medical and social history . Use of alcohol, tobacco or illicit drugs  . Current medications and supplements . Functional ability and status . Nutritional status . Physical activity . Advanced directives . List of other physicians . Hospitalizations, surgeries, and ER visits in previous 12 months . Vitals . Screenings to include cognitive, depression, and falls . Referrals and appointments  In addition, I have reviewed and discussed with patient certain preventive protocols, quality metrics, and best practice recommendations. A written personalized  care plan for preventive services as well as general preventive health recommendations were provided to patient.     Marta Antu, LPN   0/09/2328   Nurse Notes: ***

## 2019-09-18 ENCOUNTER — Ambulatory Visit (INDEPENDENT_AMBULATORY_CARE_PROVIDER_SITE_OTHER): Payer: Medicare Other

## 2019-09-18 VITALS — BP 112/84 | HR 79 | Temp 97.3°F | Resp 16 | Ht 61.0 in | Wt 199.4 lb

## 2019-09-18 DIAGNOSIS — Z0000 Encounter for general adult medical examination without abnormal findings: Secondary | ICD-10-CM | POA: Diagnosis not present

## 2019-09-18 NOTE — Progress Notes (Signed)
Subjective:   Julie Dougherty is a 69 y.o. female who presents for an Initial Medicare Annual Wellness Visit.  Son is present to interpret for patient. Interpreter services offered to patient but patient & son declined. Son states they always send a family member with her to her appointments to interpret.  Review of Systems     Cardiac Risk Factors include: advanced age (>80men, >32 women);hypertension;dyslipidemia;obesity (BMI >30kg/m2);sedentary lifestyle     Objective:    Today's Vitals   09/18/19 1030 09/18/19 1034  BP: 112/84   Pulse: 79   Resp: 16   Temp: (!) 97.3 F (36.3 C)   TempSrc: Temporal   SpO2: 98%   Weight: 199 lb 6.4 oz (90.4 kg)   Height: 5\' 1"  (1.549 m)   PainSc:  4    Body mass index is 37.68 kg/m.  Advanced Directives 09/18/2019 03/21/2019 03/17/2019 04/09/2017 03/07/2017 02/28/2017 10/20/2016  Does Patient Have a Medical Advance Directive? Yes No No No No No No  Type of Advance Directive - - - - - - -  Does patient want to make changes to medical advance directive? No - Patient declined - - - - - -  Would patient like information on creating a medical advance directive? - No - Patient declined No - Patient declined - No - Patient declined No - Patient declined -    Current Medications (verified) Outpatient Encounter Medications as of 09/18/2019  Medication Sig  . amLODipine-benazepril (LOTREL) 5-20 MG capsule Take 1 capsule by mouth daily.  . Ascorbic Acid (VITAMIN C) 1000 MG tablet Take 1,000 mg by mouth daily.  Marland Kitchen aspirin EC 81 MG tablet Take 81 mg by mouth daily.  . cholecalciferol (VITAMIN D3) 25 MCG (1000 UNIT) tablet Take 1,000 Units by mouth daily. Unsure of dosage  . cyclobenzaprine (FLEXERIL) 5 MG tablet Take 1 tablet (5 mg total) by mouth 3 (three) times daily as needed for muscle spasms.  Marland Kitchen omega-3 fish oil (MAXEPA) 1000 MG CAPS capsule Take 1 mg by mouth.  . pantoprazole (PROTONIX) 40 MG tablet Take 1 tablet (40 mg total) by mouth  daily.  . polyethylene glycol powder (MIRALAX) powder Take 17 g by mouth daily.  . pravastatin (PRAVACHOL) 40 MG tablet Take 1 tablet (40 mg total) by mouth daily.   No facility-administered encounter medications on file as of 09/18/2019.    Allergies (verified) Ciprofloxacin and Flagyl [metronidazole]   History: Past Medical History:  Diagnosis Date  . Arthritis   . Cataract   . Chronic constipation   . Diverticulitis   . Diverticulosis   . Fatty liver   . GERD (gastroesophageal reflux disease)   . Hemorrhoids, internal, with bleeding Gr 2 prolapsed 06/30/2011   Found on colonoscopy 2011 and anoscopy May 2013   . Hepatitis    "B"    no rx >10 yrs  . Hiatal hernia   . High cholesterol   . Hypertension   . Internal hemorrhoids   . OSA (obstructive sleep apnea)    does not use CPAP  . Pre-diabetes   . PUD (peptic ulcer disease)   . Rectal ulceration 04/27/2009   Associated reactive/regenerative changes and fibromuscular extensions into lamina propria/Mucosal Prolapse  . Sleep apnea    Waiting on CPAP machine   Past Surgical History:  Procedure Laterality Date  . ABDOMINAL HYSTERECTOMY    . CATARACT EXTRACTION Right 03/2018  . COLONOSCOPY  04/27/09    POLYPOID FRAGMENT OF COLONIC MUCOSA WITH SURFACE  .  EVALUATION UNDER ANESTHESIA WITH HEMORRHOIDECTOMY N/A 03/21/2019   Procedure: OPEN HEMORRHOIDECTOMY;  Surgeon: Armandina [place], MD;  Location: [place];  Service: General;  Laterality: N/A;  . HEMORRHOID BANDING    . 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 TOTAL KNEE ARTHROPLASTY Right 03/06/2017   Procedure: RIGHT TOTAL KNEE ARTHROPLASTY;  Surgeon: Mcarthur Rossetti, MD;  Location: [place];  Service: Orthopedics;  Laterality: Right;  . TUBAL LIGATION     Family History  Problem Relation Age of Onset  . Diabetes  Sister   . Asthma Sister   . Hyperlipidemia Sister   . Hypertension Sister   . Colon cancer Father 66  . Cancer Father   . Arthritis Mother   . Hypertension Mother   . Depression Sister   . Mental illness Sister   . Esophageal cancer Neg Hx   . Pancreatic cancer Neg Hx   . Stomach cancer Neg Hx    Social History   Socioeconomic History  . Marital status: Single    Spouse name: Not on file  . Number of children: 3  . Years of education: Not on file  . Highest education level: Not on file  Occupational History  . Not on file  Tobacco Use  . Smoking status: Former Research scientist (life sciences)  . Smokeless tobacco: Never Used  . 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 Vaping Use: Never used  Substance and Sexual Activity  . Alcohol use: No    Comment: seldom  . Drug use: No  . Sexual activity: Not on file  Other Topics Concern  . Not on file  Social History Narrative  . Not on file   Social Determinants of Health   Financial Resource Strain: Low Risk   . Difficulty of Paying Living Expenses: Not hard at all  Food Insecurity: No Food Insecurity  . Worried About Charity fundraiser in the Last Year: Never true  . Ran Out of Food in the Last Year: Never true  Transportation Needs: No Transportation Needs  . Lack of Transportation (Medical): No  . Lack of Transportation (Non-Medical): No  Physical Activity: Inactive  . Days of Exercise per Week: 0 days  . Minutes of Exercise per Session: 0 min  Stress: No Stress Concern Present  . Feeling of Stress : Not at all  Social Connections: Moderately Integrated  . Frequency of Communication with Friends and Family: More than three times a week  . Frequency of Social Gatherings with Friends and Family: More than three times a week  . Attends Religious Services: More than 4 times per year  . Active Member of Clubs or Organizations: Yes  . Attends Archivist Meetings: More than 4 times per year  . Marital Status: Never  married    Tobacco Counseling Counseling given: Not Answered Comment: "when she was young"   Clinical Intake:  Pre-visit preparation completed: Yes  Pain : 0-10 Pain Score: 4  Pain Location: Flank Pain Orientation: Right Pain Onset: More than a month ago Pain Frequency: Intermittent Pain Relieving Factors: prescription pain med  Pain Relieving Factors: prescription pain med  Nutritional Status: BMI > 30  Obese Nutritional Risks: None Diabetes: No  How often do you need to have someone help you when you read instructions, pamphlets, or other written materials from your doctor or  pharmacy?: 5 - Always What is the last grade level you completed in school?: unable to read  Diabetic?No  Interpreter Needed?: Yes (family memeber interpreting) Patient Declined Interpreter : Yes Interpretation services provided by: Family member interpreted visit  Information entered by :: [name] LPN   Activities of Daily Living In your present state of health, do you have any difficulty performing the following activities: 09/18/2019 03/21/2019  Hearing? N N  Vision? N N  Difficulty concentrating or making decisions? N N  Walking or climbing stairs? N N  Dressing or bathing? N N  Doing errands, shopping? N -  Preparing Food and eating ? N -  Using the Toilet? N -  In the past six months, have you accidently leaked urine? N -  Do you have problems with loss of bowel control? N -  Managing your Medications? N -  Managing your Finances? N -  Housekeeping or managing your Housekeeping? N -  Some recent data might be hidden    Patient Care Team: [name], MD as PCP - General (Family Medicine) Barton Fanny, MD (Inactive) (Family Medicine) [name], MD as Consulting Physician (Gastroenterology) [name], [facility] as Pharmacist (Pharmacist)  Indicate any recent Medical Services you may have received from other than [facility] providers in the past year  (date may be approximate).     Assessment:   This is a routine wellness examination for [place].  Hearing/Vision screen  Hearing Screening   125Hz  250Hz  500Hz  1000Hz  2000Hz  3000Hz  4000Hz  6000Hz  8000Hz   Right ear:           Left ear:           Comments: No issues  Vision Screening Comments: Cataracts removed Last eye exam-03/2019  Dietary issues and exercise activities discussed: Current Exercise Habits: The patient does not participate in regular exercise at present, Exercise limited by: None identified  Goals    . Patient Stated     Would like to lose weight      Depression Screen PHQ 2/9 Scores 09/18/2019 06/04/2019 06/04/2019 06/18/2017 10/21/2015  PHQ - 2 Score 0 0 0 0 0  PHQ- 9 Score - 1 - - -    Fall Risk Fall Risk  09/18/2019 06/04/2019 06/18/2017 10/21/2015  Falls in the past year? 0 0 No No  Number falls in past yr: 0 - - -  Injury with Fall? 0 - - -  Follow up Falls prevention discussed - - -    Any stairs in or around the home? Yes  If so, are there any without handrails? No  Home free of loose throw rugs in walkways, pet beds, electrical cords, etc? No  Adequate lighting in your home to reduce risk of falls? Yes   ASSISTIVE DEVICES UTILIZED TO PREVENT FALLS:  Life alert? No  Use of a cane, walker or w/c? No  Grab bars in the bathroom? Yes  Shower chair or bench in shower? No  Elevated toilet seat or a handicapped toilet? No   TIMED UP AND GO:  Was the test performed? Yes .  Length of time to ambulate 10 feet: 10 sec.   Gait slow and steady without use of assistive device  Cognitive Function:No cognitive impairment noted.        Immunizations Immunization History  Administered Date(s) Administered  . Influenza,inj,Quad PF,6+ Mos 12/01/2015  . Influenza-Unspecified 01/30/2018  . PFIZER SARS-COV-2 Vaccination 04/03/2019, 05/06/2019  . Tdap 06/04/2019    TDAP status: Up  to date   Flu Vaccine status: Up to date   Pneumococcal vaccine status:  Declined,  Education has been provided regarding the importance of this vaccine but patient still declined. Advised may receive this vaccine at local pharmacy or Health Dept. Aware to provide a copy of the vaccination record if obtained from local pharmacy or Health Dept. Verbalized acceptance and understanding.  Covid-19 vaccine status: Completed vaccines  Qualifies for Shingles Vaccine? Yes   Zostavax completed No   Shingrix Completed?: No.    Education has been provided regarding the importance of this vaccine. Patient has been advised to call insurance company to determine out of pocket expense if they have not yet received this vaccine. Advised may also receive vaccine at local pharmacy or Health Dept. Verbalized acceptance and understanding.  Screening Tests Health Maintenance  Topic Date Due  . DEXA SCAN  Never done  . PNA vac Low Risk Adult (1 of 2 - PCV13) Never done  . COLONOSCOPY  04/28/2019  . INFLUENZA VACCINE  08/31/2019  . MAMMOGRAM  03/11/2021  . TETANUS/TDAP  06/03/2029  . COVID-19 Vaccine  Completed  . Hepatitis C Screening  Completed    Health Maintenance  Health Maintenance Due  Topic Date Due  . DEXA SCAN  Never done  . PNA vac Low Risk Adult (1 of 2 - PCV13) Never done  . COLONOSCOPY  04/28/2019  . INFLUENZA VACCINE  08/31/2019    Colon Cancer Screening: Patient has an appt with GI on 10/01/2019  Mammogram status: Completed 03/12/2019. Repeat every year   Bone Density Status: Due- Patient declined at this time.  Lung Cancer Screening: (Low Dose CT Chest recommended if Age 38-80 years, 30 pack-year currently smoking OR have quit w/in 15years.) does not qualify.     Additional Screening:  HepatitCompleted is C Screening: Completed 03/26/2018  Vision Screening: Recommended annual ophthalmology exams for early detection of glaucoma and other disorders of the eye. Is the patient up to date with their annual eye exam?  Yes  Who is the provider or what is the  name of the office in which the patient attends annual eye exams? Unsure of name   Dental Screening: Recommended annual dental exams for proper oral hygiene  Community Resource Referral / Chronic Care Management: CRR required this visit?  No   CCM required this visit?  No      Plan:     I have personally reviewed and noted the following in the patient's chart:   . Medical and social history . Use of alcohol, tobacco or illicit drugs  . Current medications and supplements . Functional ability and status . Nutritional status . Physical activity . Advanced directives . List of other physicians . Hospitalizations, surgeries, and ER visits in previous 12 months . Vitals . Screenings to include cognitive, depression, and falls . Referrals and appointments  In addition, I have reviewed and discussed with patient certain preventive protocols, quality metrics, and best practice recommendations. A written personalized care plan for preventive services as well as general preventive health recommendations were provided to patient.    Patient to access AVS via mychart.   Marta Antu, LPN   7/62/8315   Nurse Notes: Patient complains of a recurrent rash for the past 2-3 months. Offered to make an appointment for patient to see PCP. Patient declined. Advised to call back to make an appt if rash continues.

## 2019-09-18 NOTE — Patient Instructions (Signed)
[name] , Thank you for taking time to come for your Medicare Wellness Visit. I appreciate your ongoing commitment to your health goals. Please review the following plan we discussed and let me know if I can assist you in the future.   Screening recommendations/referrals: Colonoscopy: Scheduled for 10/01/2019 Mammogram: Completed 03/12/2019-Due 03/11/2020 Bone Density: Due- Patient declined at this time Recommended yearly ophthalmology/optometry visit for glaucoma screening and checkup Recommended yearly dental visit for hygiene and checkup  Vaccinations: Influenza vaccine: Due 10/2019 Pneumococcal vaccine: Due- Discuss with PCP at next office visit. Tdap vaccine: Up to Date- Due-06/07/2019 Shingles vaccine: Discuss with pharmacy   Covid-19:Completed vaccines  Advanced directives: Discussed. Declined information today  Conditions/risks identified: Discussed recurrent rash. Make an appointment with Dr. [name] if rash continues.  Next appointment: Follow up in one year for your annual wellness visit    Preventive Care 65 Years and Older, Female Preventive care refers to lifestyle choices and visits with your health care provider that can promote health and wellness. What does preventive care include?  A yearly physical exam. This is also called an annual well check.  Dental exams once or twice a year.  Routine eye exams. Ask your health care provider how often you should have your eyes checked.  Personal lifestyle choices, including:  Daily care of your teeth and gums.  Regular physical activity.  Eating a healthy diet.  Avoiding tobacco and drug use.  Limiting alcohol use.  Practicing safe sex.  Taking low-dose aspirin every day.  Taking vitamin and mineral supplements as recommended by your health care provider. What happens during an annual well check? The services and screenings done by your health care provider during your annual well check will depend  on your age, overall health, lifestyle risk factors, and family history of disease. Counseling  Your health care provider may ask you questions about your:  Alcohol use.  Tobacco use.  Drug use.  Emotional well-being.  Home and relationship well-being.  Sexual activity.  Eating habits.  History of falls.  Memory and ability to understand (cognition).  Work and work Statistician.  Reproductive health. Screening  You may have the following tests or measurements:  Height, weight, and BMI.  Blood pressure.  Lipid and cholesterol levels. These may be checked every 5 years, or more frequently if you are over 41 years old.  Skin check.  Lung cancer screening. You may have this screening every year starting at age 79 if you have a 30-pack-year history of smoking and currently smoke or have quit within the past 15 years.  Fecal occult blood test (FOBT) of the stool. You may have this test every year starting at age 53.  Flexible sigmoidoscopy or colonoscopy. You may have a sigmoidoscopy every 5 years or a colonoscopy every 10 years starting at age 61.  Hepatitis C blood test.  Hepatitis B blood test.  Sexually transmitted disease (STD) testing.  Diabetes screening. This is done by checking your blood sugar (glucose) after you have not eaten for a while (fasting). You may have this done every 1-3 years.  Bone density scan. This is done to screen for osteoporosis. You may have this done starting at age 41.  Mammogram. This may be done every 1-2 years. Talk to your health care provider about how often you should have regular mammograms. Talk with your health care provider about your test results, treatment options, and if necessary, the need for more tests. Vaccines  Your health care provider  may recommend certain vaccines, such as:  Influenza vaccine. This is recommended every year.  Tetanus, diphtheria, and acellular pertussis (Tdap, Td) vaccine. You may need a Td  booster every 10 years.  Zoster vaccine. You may need this after age 34.  Pneumococcal 13-valent conjugate (PCV13) vaccine. One dose is recommended after age 38.  Pneumococcal polysaccharide (PPSV23) vaccine. One dose is recommended after age 38. Talk to your health care provider about which screenings and vaccines you need and how often you need them. This information is not intended to replace advice given to you by your health care provider. Make sure you discuss any questions you have with your health care provider. Document Released: 02/12/2015 Document Revised: 10/06/2015 Document Reviewed: 11/17/2014 Elsevier Interactive Patient Education  2017 [place] Prevention in the Home Falls can cause injuries. They can happen to people of all ages. There are many things you can do to make your home safe and to help prevent falls. What can I do on the outside of my home?  Regularly fix the edges of walkways and driveways and fix any cracks.  Remove anything that might make you trip as you walk through a door, such as a raised step or threshold.  Trim any bushes or trees on the path to your home.  Use bright outdoor lighting.  Clear any walking paths of anything that might make someone trip, such as rocks or tools.  Regularly check to see if handrails are loose or broken. Make sure that both sides of any steps have handrails.  Any raised decks and porches should have guardrails on the edges.  Have any leaves, snow, or ice cleared regularly.  Use sand or salt on walking paths during winter.  Clean up any spills in your garage right away. This includes oil or grease spills. What can I do in the bathroom?  Use night lights.  Install grab bars by the toilet and in the tub and shower. Do not use towel bars as grab bars.  Use non-skid mats or decals in the tub or shower.  If you need to sit down in the shower, use a plastic, non-slip stool.  Keep the floor dry. Clean up  any water that spills on the floor as soon as it happens.  Remove soap buildup in the tub or shower regularly.  Attach bath mats securely with double-sided non-slip rug tape.  Do not have throw rugs and other things on the floor that can make you trip. What can I do in the bedroom?  Use night lights.  Make sure that you have a light by your bed that is easy to reach.  Do not use any sheets or blankets that are too big for your bed. They should not hang down onto the floor.  Have a firm chair that has side arms. You can use this for support while you get dressed.  Do not have throw rugs and other things on the floor that can make you trip. What can I do in the kitchen?  Clean up any spills right away.  Avoid walking on wet floors.  Keep items that you use a lot in easy-to-reach places.  If you need to reach something above you, use a strong step stool that has a grab bar.  Keep electrical cords out of the way.  Do not use floor polish or wax that makes floors slippery. If you must use wax, use non-skid floor wax.  Do not have throw rugs  and other things on the floor that can make you trip. What can I do with my stairs?  Do not leave any items on the stairs.  Make sure that there are handrails on both sides of the stairs and use them. Fix handrails that are broken or loose. Make sure that handrails are as long as the stairways.  Check any carpeting to make sure that it is firmly attached to the stairs. Fix any carpet that is loose or worn.  Avoid having throw rugs at the top or bottom of the stairs. If you do have throw rugs, attach them to the floor with carpet tape.  Make sure that you have a light switch at the top of the stairs and the bottom of the stairs. If you do not have them, ask someone to add them for you. What else can I do to help prevent falls?  Wear shoes that:  Do not have high heels.  Have rubber bottoms.  Are comfortable and fit you well.  Are  closed at the toe. Do not wear sandals.  If you use a stepladder:  Make sure that it is fully opened. Do not climb a closed stepladder.  Make sure that both sides of the stepladder are locked into place.  Ask someone to hold it for you, if possible.  Clearly mark and make sure that you can see:  Any grab bars or handrails.  First and last steps.  Where the edge of each step is.  Use tools that help you move around (mobility aids) if they are needed. These include:  Canes.  Walkers.  Scooters.  Crutches.  Turn on the lights when you go into a dark area. Replace any light bulbs as soon as they burn out.  Set up your furniture so you have a clear path. Avoid moving your furniture around.  If any of your floors are uneven, fix them.  If there are any pets around you, be aware of where they are.  Review your medicines with your doctor. Some medicines can make you feel dizzy. This can increase your chance of falling. Ask your doctor what other things that you can do to help prevent falls. This information is not intended to replace advice given to you by your health care provider. Make sure you discuss any questions you have with your health care provider. Document Released: 11/12/2008 Document Revised: 06/24/2015 Document Reviewed: 02/20/2014 Elsevier Interactive Patient Education  2017 [employer].

## 2019-10-01 ENCOUNTER — Ambulatory Visit (AMBULATORY_SURGERY_CENTER): Payer: Medicare Other | Admitting: Internal Medicine

## 2019-10-01 ENCOUNTER — Encounter: Payer: Self-pay | Admitting: Internal Medicine

## 2019-10-01 ENCOUNTER — Other Ambulatory Visit: Payer: Self-pay

## 2019-10-01 VITALS — BP 138/77 | HR 69 | Temp 96.6°F | Resp 29 | Ht 60.5 in | Wt 198.0 lb

## 2019-10-01 DIAGNOSIS — K2100 Gastro-esophageal reflux disease with esophagitis, without bleeding: Secondary | ICD-10-CM

## 2019-10-01 DIAGNOSIS — Z1211 Encounter for screening for malignant neoplasm of colon: Secondary | ICD-10-CM | POA: Diagnosis not present

## 2019-10-01 DIAGNOSIS — D125 Benign neoplasm of sigmoid colon: Secondary | ICD-10-CM | POA: Diagnosis not present

## 2019-10-01 DIAGNOSIS — R1013 Epigastric pain: Secondary | ICD-10-CM

## 2019-10-01 DIAGNOSIS — G4733 Obstructive sleep apnea (adult) (pediatric): Secondary | ICD-10-CM | POA: Diagnosis not present

## 2019-10-01 DIAGNOSIS — K449 Diaphragmatic hernia without obstruction or gangrene: Secondary | ICD-10-CM

## 2019-10-01 DIAGNOSIS — K2970 Gastritis, unspecified, without bleeding: Secondary | ICD-10-CM

## 2019-10-01 DIAGNOSIS — K209 Esophagitis, unspecified without bleeding: Secondary | ICD-10-CM | POA: Diagnosis not present

## 2019-10-01 MED ORDER — SODIUM CHLORIDE 0.9 % IV SOLN: 500.0000 mL | INTRAVENOUS | Status: DC

## 2019-10-01 NOTE — Progress Notes (Signed)
Report to PACU, RN, vss, BBS= Clear.  

## 2019-10-01 NOTE — Op Note (Signed)
[place] Patient Name: Julie Dougherty Procedure Date: 10/01/2019 3:06 PM MRN: [id number] Endoscopist: [name] , MD Age: 69 Referring MD:  Date of Birth: 02-Sep-1950 Gender: Female Account #: [id number] Procedure:                Upper GI endoscopy Indications:              Epigastric abdominal pain Medicines:                Propofol per Anesthesia, Monitored Anesthesia Care Procedure:                Pre-Anesthesia Assessment:                           - Prior to the procedure, a History and Physical                            was performed, and patient medications and                            allergies were reviewed. The patient's tolerance of                            previous anesthesia was also reviewed. The risks                            and benefits of the procedure and the sedation                            options and risks were discussed with the patient.                            All questions were answered, and informed consent                            was obtained. Prior Anticoagulants: The patient has                            taken no previous anticoagulant or antiplatelet                            agents. ASA Grade Assessment: II - A patient with                            mild systemic disease. After reviewing the risks                            and benefits, the patient was deemed in                            satisfactory condition to undergo the procedure.                           After obtaining informed consent, the endoscope was  passed under direct vision. Throughout the                            procedure, the patient's blood pressure, pulse, and                            oxygen saturations were monitored continuously. The                            Endoscope was introduced through the mouth, and                            advanced to the second part of duodenum. The upper                             GI endoscopy was accomplished without difficulty.                            The patient tolerated the procedure well. Scope In: Scope Out: Findings:                 LA Grade B (one or more mucosal breaks greater than                            5 mm, not extending between the tops of two mucosal                            folds) esophagitis was found in the lower third of                            the esophagus. Biopsies were taken with a cold                            forceps for histology. Verification of patient                            identification for the specimen was done. Estimated                            blood loss was minimal.                           A sliding hiatal hernia was found. The hiatal                            narrowing was 35 cm from the incisors. The Z-line                            was 30 cm from the incisors.                           The exam was otherwise without abnormality.  The cardia and gastric fundus were normal on                            retroflexion. Complications:            No immediate complications. Estimated Blood Loss:     Estimated blood loss was minimal. Impression:               - LA Grade B reflux esophagitis. Biopsied.                           - Sliding hiatal hernia. 5 cm                           - The examination was otherwise normal. Recommendation:           - Patient has a contact number available for                            emergencies. The signs and symptoms of potential                            delayed complications were discussed with the                            patient. Return to normal activities tomorrow.                            Written discharge instructions were provided to the                            patient.                           - Resume previous diet.                           - Continue present medications.                           - Await pathology  results.                           - See the other procedure note for documentation of                            additional recommendations.                           - Will probably need a different vis bid PPI or both [name], MD 10/01/2019 3:49:59 PM This report has been signed electronically.

## 2019-10-01 NOTE — Patient Instructions (Addendum)
There is inflammation in the esophagus that looks like it is coming from stomach acid reflux.   I took biopsies to understand better. Once I see those results will make treatment recommendations.  May need to change the pantoprazole to something else or increase to twice a day.  There is also a hiatal hernia.  In the colon one tiny polyp was seen and removed. I do not think it is anything bad. It will also be analyzed and I will let you know. You also have a condition called diverticulosis - common and not usually a problem. Please read the handout provided.   I appreciate the opportunity to care for you. [name], MD, FACG  YOU HAD AN ENDOSCOPIC PROCEDURE TODAY AT [facility]:   Refer to the procedure report that was given to you for any specific questions about what was found during the examination.  If the procedure report does not answer your questions, please call your gastroenterologist to clarify.  If you requested that your care partner not be given the details of your procedure findings, then the procedure report has been included in a sealed envelope for you to review at your convenience later.  YOU SHOULD EXPECT: Some feelings of bloating in the abdomen. Passage of more gas than usual.  Walking can help get rid of the air that was put into your GI tract during the procedure and reduce the bloating. If you had a lower endoscopy (such as a colonoscopy or flexible sigmoidoscopy) you may notice spotting of blood in your stool or on the toilet paper. If you underwent a bowel prep for your procedure, you may not have a normal bowel movement for a few days.  Please Note:  You might notice some irritation and congestion in your nose or some drainage.  This is from the oxygen used during your procedure.  There is no need for concern and it should clear up in a day or so.  SYMPTOMS TO REPORT IMMEDIATELY:   Following lower endoscopy (colonoscopy or flexible  sigmoidoscopy):  Excessive amounts of blood in the stool  Significant tenderness or worsening of abdominal pains  Swelling of the abdomen that is new, acute  Fever of 100F or higher   Following upper endoscopy (EGD)  Vomiting of blood or coffee ground material  New chest pain or pain under the shoulder blades  Painful or persistently difficult swallowing  New shortness of breath  Fever of 100F or higher  Black, tarry-looking stools  For urgent or emergent issues, a gastroenterologist can be reached at any hour by calling [phone]. Do not use MyChart messaging for urgent concerns.    DIET:  We do recommend a small meal at first, but then you may proceed to your regular diet.  Drink plenty of fluids but you should avoid alcoholic beverages for 24 hours.  ACTIVITY:  You should plan to take it easy for the rest of today and you should NOT DRIVE or use heavy machinery until tomorrow (because of the sedation medicines used during the test).    FOLLOW UP: Our staff will call the number listed on your records 48-72 hours following your procedure to check on you and address any questions or concerns that you may have regarding the information given to you following your procedure. If we do not reach you, we will leave a message.  We will attempt to reach you two times.  During this call, we will ask if you have  developed any symptoms of COVID 19. If you develop any symptoms (ie: fever, flu-like symptoms, shortness of breath, cough etc.) before then, please call [phone].  If you test positive for Covid 19 in the 2 weeks post procedure, please call and report this information to [place].    If any biopsies were taken you will be contacted by phone or by letter within the next 1-3 weeks.  Please call us at [phone] if you have not heard about the biopsies in 3 weeks.    SIGNATURES/CONFIDENTIALITY: You and/or your care partner have signed paperwork which will be entered into your  electronic medical record.  These signatures attest to the fact that that the information above on your After Visit Summary has been reviewed and is understood.  Full responsibility of the confidentiality of this discharge information lies with you and/or your care-partner.  USTED TUVO UN PROCEDIMIENTO ENDOSCPICO HOY EN EL [facility]:   Lea el informe del procedimiento que se le entreg para cualquier pregunta especfica sobre lo que se Primary school teacher.  Si el informe del examen no responde a sus preguntas, por favor llame a su gastroenterlogo para aclararlo.  Si usted solicit que no se le den [employer] de lo que se Estate manager/land agent en su procedimiento al [employer] va a cuidar, entonces el informe del procedimiento se ha incluido en un sobre sellado para que usted lo revise despus cuando le sea ms conveniente.   LO QUE PUEDE ESPERAR: Algunas sensaciones de hinchazn en el abdomen.  Puede tener ms gases de lo normal.  El caminar puede ayudarle a eliminar el aire que se le puso en el tracto gastrointestinal durante el procedimiento y reducir la hinchazn.  Si le hicieron una endoscopia inferior (como una colonoscopia o una sigmoidoscopia flexible), podra notar manchas de sangre en las heces fecales o en el papel higinico.  Si se someti a una preparacin intestinal para su procedimiento, es posible que no tenga una evacuacin intestinal normal durante [employer].   Tenga en cuenta:  Es posible que note un poco de irritacin y congestin en la nariz o algn drenaje.  Esto es debido al oxgeno [employer] durante su procedimiento.  No hay que preocuparse y esto debe desaparecer ms o Scientist, research (medical).   SNTOMAS PARA REPORTAR INMEDIATAMENTE:  Despus de una endoscopia inferior (colonoscopia o sigmoidoscopia flexible):  Cantidades excesivas de sangre en las heces fecales  Sensibilidad significativa o empeoramiento de los dolores abdominales   Hinchazn aguda del abdomen que  antes no tena   Fiebre de 100F o ms   Despus de la endoscopia superior (EGD)  Vmitos de Biochemist, clinical o material como caf molido   Dolor en el pecho o dolor debajo de los omplatos que antes no tena   Dolor o dificultad persistente para tragar  Falta de aire que antes no tena   Fiebre de 100F o ms  Heces fecales negras y pegajosas   Para asuntos urgentes o de Freight forwarder, puede comunicarse con un gastroenterlogo a cualquier hora llamando al [phone].  DIETA:  Recomendamos una comida pequea al principio, pero luego puede continuar con su dieta normal.  Tome muchos lquidos, Teacher, adult education las bebidas alcohlicas durante 24 horas.    ACTIVIDAD:  Debe planear tomarse las cosas con calma por el resto del da y no debe CONDUCIR ni usar maquinaria pesada Programmer, applications (debido a los medicamentos de sedacin utilizados durante el examen).     SEGUIMIENTO: Oswald Hillock  personal llamar al nmero que aparece en su historial al siguiente da hbil de su procedimiento para ver cmo se siente y para responder cualquier pregunta o inquietud que pueda tener con respecto a la informacin que se le dio despus del procedimiento. Si no podemos contactarle, le dejaremos un mensaje.  Sin embargo, si se siente bien y no tiene Paediatric nurse, no es necesario que nos devuelva la llamada.  Asumiremos que ha regresado a sus actividades diarias normales sin incidentes. Si se le tomaron algunas biopsias, le contactaremos por telfono o por carta en las prximas 3 semanas.  Si no ha sabido [employer] biopsias en el transcurso de 3 semanas, por favor llmenos al [phone].   FIRMAS/CONFIDENCIALIDAD: Usted y/o el acompaante que le cuide han firmado documentos que se ingresarn en su historial mdico electrnico.  Estas firmas atestiguan el hecho de que la informacin anterior

## 2019-10-01 NOTE — Progress Notes (Signed)
Called to room to assist during endoscopic procedure.  Patient ID and intended procedure confirmed with present staff. Received instructions for my participation in the procedure from the performing physician.  

## 2019-10-01 NOTE — Op Note (Signed)
[facility] Patient Name: Julie Dougherty Procedure Date: 10/01/2019 3:06 PM MRN: [id number] Endoscopist: [name] , MD Age: 69 Referring MD:  Date of Birth: [birth year] Gender: Female Account #: [id number] Procedure:                Colonoscopy Indications:              Screening for colorectal malignant neoplasm, Last                            colonoscopy: 2011 Medicines:                Propofol per Anesthesia, Monitored Anesthesia Care Procedure:                Pre-Anesthesia Assessment:                           - Prior to the procedure, a History and Physical                            was performed, and patient medications and                            allergies were reviewed. The patient's tolerance of                            previous anesthesia was also reviewed. The risks                            and benefits of the procedure and the sedation                            options and risks were discussed with the patient.                            All questions were answered, and informed consent                            was obtained. Prior Anticoagulants: The patient has                            taken no previous anticoagulant or antiplatelet                            agents. ASA Grade Assessment: II - A patient with                            mild systemic disease. After reviewing the risks                            and benefits, the patient was deemed in                            satisfactory condition to undergo the procedure.  After obtaining informed consent, the colonoscope                            was passed under direct vision. Throughout the                            procedure, the patient's blood pressure, pulse, and                            oxygen saturations were monitored continuously. The                            Colonoscope was introduced through the anus and                            advanced  to the the cecum, identified by                            appendiceal orifice and ileocecal valve. The                            colonoscopy was performed without difficulty. The                            patient tolerated the procedure well. The quality                            of the bowel preparation was good. The ileocecal                            valve, appendiceal orifice, and rectum were                            photographed. The bowel preparation used was                            Miralax via split dose instruction. Scope In: 3:23:49 PM Scope Out: 3:34:28 PM Scope Withdrawal Time: 0 hours 8 minutes 37 seconds  Total Procedure Duration: 0 hours 10 minutes 39 seconds  Findings:                 The perianal and digital rectal examinations were                            normal.                           A diminutive polyp was found in the sigmoid colon.                            The polyp was sessile. The polyp was removed with a                            cold snare. Resection and retrieval were complete.  Verification of patient identification for the                            specimen was done. Estimated blood loss was minimal.                           Multiple small and large-mouthed diverticula were                            found in the sigmoid colon.                           The exam was otherwise without abnormality on                            direct and retroflexion views. Complications:            No immediate complications. Estimated Blood Loss:     Estimated blood loss was minimal. Impression:               - One diminutive polyp in the sigmoid colon,                            removed with a cold snare. Resected and retrieved.                           - Diverticulosis in the sigmoid colon.                           - The examination was otherwise normal on direct                            and retroflexion  views. Recommendation:           - Patient has a contact number available for                            emergencies. The signs and symptoms of potential                            delayed complications were discussed with the                            patient. Return to normal activities tomorrow.                            Written discharge instructions were provided to the                            patient.                           - Resume previous diet.                           - Continue present medications.                           -  Repeat colonoscopy is recommended. The                            colonoscopy date will be determined after pathology                            results from today's exam become available for                            review. [name], MD 10/01/2019 3:52:29 PM This report has been signed electronically.

## 2019-10-01 NOTE — Progress Notes (Signed)
JB- front desk  Red Christians RN- VS  Interpreter with pt -daughter in law- [place]

## 2019-10-03 ENCOUNTER — Telehealth: Payer: Self-pay | Admitting: *Deleted

## 2019-10-03 NOTE — Telephone Encounter (Signed)
  Follow up Call-  Call back number 10/01/2019  Post procedure Call Back phone  # [phone]- daughter in law-Ana  Permission to leave phone message Yes  Some recent data might be hidden     Patient questions:  Do you have a fever, pain , or abdominal swelling? No. Pain Score  0 *  Have you tolerated food without any problems? Yes.    Have you been able to return to your normal activities? Yes.    Do you have any questions about your discharge instructions: Diet   No. Medications  No. Follow up visit  No.  Do you have questions or concerns about your Care? No.  Actions: * If pain score is 4 or above: No action needed, pain <4.  1. Have you developed a fever since your procedure? no  2.   Have you had an respiratory symptoms (SOB or cough) since your procedure? no  3.   Have you tested positive for COVID 19 since your procedure no  4.   Have you had any family members/close contacts diagnosed with the COVID 19 since your procedure?  no   If yes to any of these questions please route to Joylene John, RN and Joella Prince, RN

## 2019-10-07 ENCOUNTER — Telehealth: Payer: Self-pay | Admitting: Orthopaedic Surgery

## 2019-10-07 ENCOUNTER — Encounter: Payer: Self-pay | Admitting: Family Medicine

## 2019-10-07 NOTE — Telephone Encounter (Signed)
Pt called stating she had an X-Ray done and showed degenerate disc disease and wanted to know if Dr.Blackman would continue her care?  [phone]

## 2019-10-07 NOTE — Telephone Encounter (Signed)
It would probably be better to see a back specialist here Yates/Nitka/Newton

## 2019-10-07 NOTE — Telephone Encounter (Signed)
I will see her 

## 2019-10-08 NOTE — Telephone Encounter (Signed)
Then she should ask the PA to refer her. I cannot refer her if I have never seen the rash.

## 2019-10-09 ENCOUNTER — Other Ambulatory Visit: Payer: Self-pay | Admitting: Internal Medicine

## 2019-10-09 DIAGNOSIS — K219 Gastro-esophageal reflux disease without esophagitis: Secondary | ICD-10-CM

## 2019-10-09 MED ORDER — PANTOPRAZOLE SODIUM 40 MG PO TBEC: 40.0000 mg | DELAYED_RELEASE_TABLET | Freq: Two times a day (BID) | ORAL | 3 refills | Status: DC

## 2019-10-14 ENCOUNTER — Telehealth: Payer: Self-pay | Admitting: Family Medicine

## 2019-10-14 NOTE — Chronic Care Management (AMB) (Signed)
Chronic Care Management Pharmacy  Name: Julie Dougherty  MRN: [id number] DOB: 1950/09/09   Chief Complaint/ HPI  [name],  69 y.o. , female presents for their Initial CCM visit with the clinical pharmacist In office.  PCP : Julie Maw, MD Patient Care Team: Julie Maw, MD as PCP - General (Family Medicine) Barton Fanny, MD (Inactive) (Family Medicine) Julie Mayer, MD as Consulting Physician (Gastroenterology) Julie Dougherty, [facility] as Pharmacist (Pharmacist)  Their chronic conditions include: Hypertension, Hyperlipidemia and GERD   Office Visits: 10/17/19: Patient presented to Dr. Ethelene Dougherty for follow-up. Patient prescribed permethrin 5% cream for rash on trunk, arms and legs.  09/18/19: Patient presented to Julie Hamman, LPN for AWV. 07/31/67: Patient presented to Dr. Ethelene Dougherty for follow-up. Pravastatin increased to 40 mg daily. Hydrocortisone, tramadol stopped.  Consult Visit: 10/15/19: Patient presented to Julie Core, PA-C (Orthopedic Surgery).  09/08/19: Patient presented to [facility] (GI) for abdominal pain.   Allergies  Allergen Reactions  . Ciprofloxacin Hives and Nausea Only  . Flagyl [Metronidazole] Hives and Nausea Only    Medications: Outpatient Encounter Medications as of 10/17/2019  Medication Sig  . amLODipine-benazepril (LOTREL) 5-20 MG capsule Take 1 capsule by mouth daily.  . Ascorbic Acid (VITAMIN C) 1000 MG tablet Take 1,000 mg by mouth daily.  Marland Kitchen aspirin EC 81 MG tablet Take 81 mg by mouth daily.  . cetirizine (ZYRTEC) 10 MG tablet Take 10 mg by mouth daily.  Marland Kitchen omega-3 fish oil (MAXEPA) 1000 MG CAPS capsule Take 1 mg by mouth.  . pantoprazole (PROTONIX) 40 MG tablet Take 1 tablet (40 mg total) by mouth 2 (two) times daily before a meal. About 30 minutes before breakfast and supper  . pravastatin (PRAVACHOL) 40 MG tablet Take 1 tablet (40 mg total) by mouth daily.  . [DISCONTINUED] Alum & Mag  Hydroxide-Simeth (GI COCKTAIL) SUSP suspension Take 30 mLs by mouth every 6 (six) hours as needed for indigestion. Shake well.  . cholecalciferol (VITAMIN D3) 25 MCG (1000 UNIT) tablet Take 1,000 Units by mouth daily. Unsure of dosage   . cyclobenzaprine (FLEXERIL) 5 MG tablet Take 1 tablet (5 mg total) by mouth 3 (three) times daily as needed for muscle spasms.   No facility-administered encounter medications on file as of 10/17/2019.    Wt Readings from Last 3 Encounters:  10/17/19 198 lb 3.2 oz (89.9 kg)  10/15/19 198 lb (89.8 kg)  10/01/19 198 lb (89.8 kg)    Current Diagnosis/Assessment:  SDOH Interventions     Most Recent Value  SDOH Interventions  Financial Strain Interventions Intervention Not Indicated  Transportation Interventions Intervention Not Indicated      Goals Addressed            This Visit's Progress   . Chronic Care Management       CARE PLAN ENTRY (see longitudinal plan of care for additional care plan information)  Current Barriers:  . Chronic Disease Management support, education, and care coordination needs related to Hypertension, Hyperlipidemia and GERD    Hypertension BP Readings from Last 3 Encounters:  10/17/19 116/74  10/15/19 (!) 136/92  10/01/19 138/77   . Pharmacist Clinical Goal(s): o Over the next 90 days, patient will work with PharmD and providers to achieve BP goal <130/80 . Current regimen:  o Amlodipine- Benazepril 5-20 mg daily . Interventions: o Discussed low salt diet and exercising as tolerated extensively o Will initiate blood pressure monitoring plan  . Patient self care  activities - Over the next 90 days, patient will: o Check Blood Pressure 1-2 times weekly, document, and provide at future appointments o Ensure daily salt intake < 2300 mg/day  Hyperlipidemia Lab Results  Component Value Date/Time   LDLCALC 115 (H) 06/04/2019 11:02 AM   LDLDIRECT 114.0 06/04/2019 11:02 AM   . Pharmacist Clinical Goal(s): o Over  the next 90 days, patient will work with PharmD and providers to achieve LDL goal < 100 . Current regimen:  o Pravastatin 40 mg daily  . Interventions: o Discussed low cholesterol diet and exercising as tolerated extensively o Will initiate cholesterol monitoring plan   Medication management . Pharmacist Clinical Goal(s): o Over the next 90 days, patient will work with PharmD and providers to maintain optimal medication adherence . Current pharmacy: [facility] . Interventions o Comprehensive medication review performed. o Continue current medication management strategy . Patient self care activities - Over the next 90 days, patient will: o Take medications as prescribed o Report any questions or concerns to PharmD and/or provider(s)      Hypertension   BP goal is:  <130/80  Office blood pressures are  BP Readings from Last 3 Encounters:  10/17/19 116/74  10/15/19 (!) 136/92  10/01/19 138/77   Patient checks BP at home 1-2x per week Patient home BP readings are ranging: n/a  Patient has failed these meds in the past: n/a Patient is currently controlled on the following medications:  . Amlodipine- Benazepril 5-20 mg daily   We discussed diet and exercise extensively  Plan  Continue current medications   Hyperlipidemia   LDL goal < 100  Lipid Panel     Component Value Date/Time   CHOL 199 06/04/2019 1102   TRIG 144.0 06/04/2019 1102   HDL 55.10 06/04/2019 1102   LDLCALC 115 (H) 06/04/2019 1102   LDLDIRECT 114.0 06/04/2019 1102    Hepatic Function Latest Ref Rng & Units 06/04/2019 07/16/2018 03/26/2018  Total Protein 6.0 - 8.3 g/dL 7.3 7.6 8.0  Albumin 3.5 - 5.2 g/dL 4.2 4.2 4.3  AST 0 - 37 U/L _0 ALT 0 - 35 U/L _1 Alk Phosphatase 39 - 117 U/L 69 70 85  Total Bilirubin 0.2 - 1.2 mg/dL 0.5 0.4 0.4  Bilirubin, Direct 0.0 - 0.3 mg/dL - - 0.0     The 10-year ASCVD risk score Julie Dougherty DC Jr., et al., 2013) is: 8.4%   Values used to calculate the score:      Age: 60 years     Sex: Female     Is Non-Hispanic African American: No     Diabetic: No     Tobacco smoker: No     Systolic Blood Pressure: 867 mmHg     Is BP treated: Yes     HDL Cholesterol: 55.1 mg/dL     Total Cholesterol: 199 mg/dL   Patient has failed these meds in past: n/a Patient is currently uncontrolled on the following medications:  . Aspirin 81 mg daily  . Fish Oil 1000 mg daily  . Pravastatin 40 mg daily   We discussed:  diet and exercise extensively. Tolerating dose increase of pravastatin well.   Plan  Continue current medications  GERD   Patient denies dysphagia, heartburn or nausea. Expresses understanding to avoid triggers such as citrus juices, fatty foods and lying down after eating.  Currently controlled on: . Pantoprazole 40 mg twice daily   Plan   Continue current medication.   Misc /  OTC   . Vitamin C 1000 mg daily  . Cetirizine 10 mg daily  . Vitamin D3 1000 units daily  . Cyclobenzaprine 5 mg TID PRN   Plan  Continue current medications  Medication Management   Pt uses [facility] for all medications Uses pill box? No - Doesn't feel that she needs  Plan  Continue current medication management strategy  Follow up: 12 month phone visit  [place] Pharmacist [facility]

## 2019-10-14 NOTE — Progress Notes (Signed)
  Chronic Care Management   Note  10/14/2019 Name: Peggy Loge MRN: [id number] DOB: March 17, 1950  G[place] is a 69 y.o. year old female who is a primary care patient of [name], MD. I reached out to [place] by phone today in response to a referral sent by Ms. [place] Sierra's PCP, [name], MD.   Ms. Rodriguez-de Caryl Comes was given information about Chronic Care Management services today including:  1. CCM service includes personalized support from designated clinical staff supervised by her physician, including individualized plan of care and coordination with other care providers 2. 24/7 contact phone numbers for assistance for urgent and routine care needs. 3. Service will only be billed when office clinical staff spend 20 minutes or more in a month to coordinate care. 4. Only one practitioner may furnish and bill the service in a calendar month. 5. The patient may stop CCM services at any time (effective at the end of the month) by phone call to the office staff.   Patient agreed to services and verbal consent obtained.   Follow up plan:   Carley Perdue [employer] Scheduler

## 2019-10-15 ENCOUNTER — Other Ambulatory Visit: Payer: Self-pay

## 2019-10-15 ENCOUNTER — Ambulatory Visit
Admission: RE | Admit: 2019-10-15 | Discharge: 2019-10-15 | Disposition: A | Discharge status: Home / Self Care | Payer: Medicare Other | Source: Ambulatory Visit | Attending: Surgery | Admitting: Surgery

## 2019-10-15 ENCOUNTER — Encounter: Payer: Self-pay | Admitting: Surgery

## 2019-10-15 ENCOUNTER — Ambulatory Visit: Payer: Self-pay

## 2019-10-15 ENCOUNTER — Ambulatory Visit (INDEPENDENT_AMBULATORY_CARE_PROVIDER_SITE_OTHER): Payer: Medicare Other | Admitting: Surgery

## 2019-10-15 ENCOUNTER — Telehealth: Payer: Self-pay

## 2019-10-15 VITALS — BP 136/92 | HR 91 | Ht 60.5 in | Wt 198.0 lb

## 2019-10-15 DIAGNOSIS — R1011 Right upper quadrant pain: Secondary | ICD-10-CM | POA: Diagnosis not present

## 2019-10-15 DIAGNOSIS — R1013 Epigastric pain: Secondary | ICD-10-CM | POA: Diagnosis not present

## 2019-10-15 DIAGNOSIS — K30 Functional dyspepsia: Secondary | ICD-10-CM

## 2019-10-15 DIAGNOSIS — M545 Low back pain: Secondary | ICD-10-CM | POA: Diagnosis not present

## 2019-10-15 DIAGNOSIS — M40204 Unspecified kyphosis, thoracic region: Secondary | ICD-10-CM | POA: Diagnosis not present

## 2019-10-15 DIAGNOSIS — K449 Diaphragmatic hernia without obstruction or gangrene: Secondary | ICD-10-CM

## 2019-10-15 DIAGNOSIS — M546 Pain in thoracic spine: Secondary | ICD-10-CM

## 2019-10-15 DIAGNOSIS — M2578 Osteophyte, vertebrae: Secondary | ICD-10-CM | POA: Diagnosis not present

## 2019-10-15 DIAGNOSIS — M47814 Spondylosis without myelopathy or radiculopathy, thoracic region: Secondary | ICD-10-CM | POA: Diagnosis not present

## 2019-10-15 DIAGNOSIS — G8929 Other chronic pain: Secondary | ICD-10-CM

## 2019-10-15 MED ORDER — GI COCKTAIL ~~LOC~~: 30.0000 mL | Freq: Four times a day (QID) | ORAL | 3 refills | Status: DC | PRN

## 2019-10-15 NOTE — Progress Notes (Signed)
Office Visit Note   Patient: Julie Dougherty           Date of Birth: [birth year]           MRN: [id number] Visit Date: 10/15/2019              Requested by: [name], MD [address] [place],  [place] [address] PCP: [name], MD   Assessment & Plan: Visit Diagnoses:  1. Chronic low back pain, unspecified back pain laterality, unspecified whether sciatica present   2. Epigastric pain   3. Right upper quadrant pain   4. Pain in thoracic spine   5. Hiatal hernia     Plan: Advised patient and family member who was present that I am concerned about the epigastric and right upper quadrant pain that she is describing.  I advised him that x-rays of the thoracic spine that were previously done do show multilevel degenerative disc disease but I do not see that as the source of her epigastric and right upper quadrant pain.  I do recommend that she follow-up with gastroenterologist.  Endoscopy that was done October 01, 2019 did show a sliding hiatal hernia of 5 cm along with reflux esophagitis.  I did not see any work-up that was done to rule out issues with her gallbladder.  With the right sided mid back pain that she is having I will go ahead and get a thoracic MRI to rule out HNP/stenosis there.  They will follow-up after completion to discuss results.  Did not recommend any oral NSAIDs obviously due to her GI issues.  Follow-Up Instructions: No follow-ups on file.   Orders:  Orders Placed This Encounter  Procedures  . XR Lumbar Spine Complete  . MR Thoracic Spine w/o contrast   No orders of the defined types were placed in this encounter.     Procedures: No procedures performed   Clinical Data: No additional findings.   Subjective: No chief complaint on file.   HPI 69 year old female comes in today with complaints of epigastric pain, right upper quadrant pain and right upper back pain.  Patient states that this problem has been  ongoing for a couple months.  Hurts when she is sitting and going to get up.  States that pain is in the middle of her back but with the help of translator she more localizes the majority of her pain to the epigastric area and right upper quadrant.  After repeated questioning the translator and eventually advised me the patient had a GI work-up for the pain that she is describing.  October 01, 2018 when she had a upper GI endoscopy which showed LA grade B esophagitis in the lower third of the esophagus.  Also showed a 5 cm sliding hiatal hernia.  I do not see any work-up for potential gallbladder issue.  Patient not have any neck pain or complaints of upper or lower extremity radiculopathy.  No weakness.   Patient also did have thoracic spine x-rays performed September 08, 2019 that showed:  EXAM: THORACIC SPINE - 3 VIEWS  COMPARISON:  12/18/2003.  FINDINGS: Moderate anterior and right lateral spur formation at multiple levels. No fractures or subluxations. Mild to moderate lower cervical spine degenerative changes.  IMPRESSION: Multilevel degenerative changes.   Electronically Signed   By: Claudie Revering M.D.   On: 09/08/2019 21:23  Review of Systems Complains of epigastric and right upper quadrant pain.  No bowel or bladder issues.  Objective: Vital  Signs: BP (!) 136/92   Pulse 91   Ht 5' 0.5" (1.537 m)   Wt 198 lb (89.8 kg)   BMI 38.03 kg/m   Physical Exam HENT:     Head: Normocephalic and atraumatic.  Eyes:     Extraocular Movements: Extraocular movements intact.     Pupils: Pupils are equal, round, and reactive to light.  Pulmonary:     Effort: No respiratory distress.  Musculoskeletal:     Comments: Gait is normal.  Patient is mild thoracolumbar paraspinal tenderness on the right.  Cervical spine unremarkable.  Bilateral shoulder and elbow exam unremarkable.  Bilateral hip exam unremarkable.  No focal motor deficits upper and lower extremities.    Neurological:      General: No focal deficit present.     Mental Status: She is alert and oriented to person, place, and time.  Psychiatric:        Mood and Affect: Mood normal.     Ortho Exam  Specialty Comments:  No specialty comments available.  Imaging: No results found.   PMFS History: Patient Active Problem List   Diagnosis Date Noted  . Pain in both hands 06/04/2019  . Chest wall muscle strain 06/04/2019  . Sleep apnea 03/26/2018  . History of hepatitis B 03/26/2018  . Healthcare maintenance 03/26/2018  . 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 Status post total knee replacement, left 08/29/2016  . Baker's cyst of knee, left - suspected 05/30/2016  . Diverticulitis of colon 03/17/2016  . Hemorrhoids, internal, with bleeding Gr 2 prolapsed 06/30/2011  . Diverticulitis 06/06/2011  . Rectal bleeding 04/19/2009  . ABDOMINAL PAIN, RIGHT LOWER QUADRANT 04/19/2009  . Elevated cholesterol 07/11/2006  . Essential hypertension 07/11/2006  . GERD 07/11/2006  . HEPATITIS B, HX OF 07/11/2006   Past Medical History:  Diagnosis Date  . Arthritis   . Cataract   . Chronic constipation   . Diverticulitis   . Diverticulosis   . Fatty liver   . GERD (gastroesophageal reflux disease)   . Hemorrhoids, internal, with bleeding Gr 2 prolapsed 06/30/2011   Found on colonoscopy 2011 and anoscopy May 2013   . Hepatitis    "B"    no rx >10 yrs  . Hiatal hernia   . High cholesterol   . Hypertension   . Internal hemorrhoids   . OSA (obstructive sleep apnea)    does not use CPAP  . Pre-diabetes   . PUD (peptic ulcer disease)   . Rectal ulceration 04/27/2009   Associated reactive/regenerative changes and fibromuscular extensions into lamina propria/Mucosal Prolapse  . Sleep apnea    Waiting on CPAP machine    Family History  Problem Relation Age of Onset  . Diabetes Sister   . Asthma Sister   . Hyperlipidemia Sister   . Hypertension Sister   . Colon cancer Father 71  . Cancer  Father   . Arthritis Mother   . Hypertension Mother   . Depression Sister   . Mental illness Sister   . Esophageal cancer Neg Hx   . Pancreatic cancer Neg Hx   . Stomach cancer Neg Hx     Past Surgical History:  Procedure Laterality Date  . ABDOMINAL HYSTERECTOMY    . CATARACT EXTRACTION Right 03/2018  . COLONOSCOPY  04/27/09    POLYPOID FRAGMENT OF COLONIC MUCOSA WITH SURFACE  . EVALUATION UNDER ANESTHESIA WITH HEMORRHOIDECTOMY N/A 03/21/2019   Procedure: OPEN HEMORRHOIDECTOMY;  Surgeon: Armandina Gemma, MD;  Location: [facility];  Service: General;  Laterality: N/A;  . HEMORRHOID BANDING    . TOTAL KNEE ARTHROPLASTY Left 08/29/2016   Procedure: LEFT TOTAL KNEE ARTHROPLASTY;  Surgeon: Mcarthur Rossetti, MD;  Location: [facility];  Service: Orthopedics;  Laterality: Left;  . TOTAL KNEE ARTHROPLASTY Right 03/06/2017   Procedure: RIGHT TOTAL KNEE ARTHROPLASTY;  Surgeon: Mcarthur Rossetti, MD;  Location: [facility];  Service: Orthopedics;  Laterality: Right;  . TUBAL LIGATION     Social History   Occupational History  . Not on file  Tobacco Use  . Smoking status: Former Research scientist (life sciences)  . Smokeless tobacco: Never Used  . 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 Vaping Use: Never used  Substance and Sexual Activity  . Alcohol use: No    Comment: seldom  . Drug use: No  . Sexual activity: Not on file

## 2019-10-15 NOTE — Telephone Encounter (Signed)
U/S 10/20/19 at 8:00 am [facility] Radiology. NPO p MN Rx to [facility] and [facility] per daughter.

## 2019-10-15 NOTE — Telephone Encounter (Signed)
Patient has seen the Ortho provider about the degenerative disc issues and her pain. The patient is continuing to burp, have RUQ pain and indigestion that is worsening. The orthopedist asked her about the possibility of gallbladder problems. I asked if she had any known dietary triggers. She says even water seems to cause her symptoms. She is on Protonix BID. Please advise.

## 2019-10-15 NOTE — Telephone Encounter (Signed)
She just had EGD and as GERD and esophagitis - so needs to stay on BID protonix , antireflux diet --- lets schedule for complete upper abd [place].  Also can send rx for GI Cocktail 30cc q6 hours prn for pain/ indigestion - see if that will help-  30 day supply

## 2019-10-16 ENCOUNTER — Telehealth: Payer: Self-pay

## 2019-10-16 ENCOUNTER — Other Ambulatory Visit: Payer: Self-pay

## 2019-10-16 NOTE — Progress Notes (Signed)
Left Voice message to do initial question prior to patient appointment on 10/17/2019 for CCM at 9:00 AM with Junius Argyle the Clinical pharmacist.   [place] Pharmacist Assistant [phone]

## 2019-10-17 ENCOUNTER — Ambulatory Visit: Payer: Medicare Other

## 2019-10-17 ENCOUNTER — Encounter: Payer: Self-pay | Admitting: Family Medicine

## 2019-10-17 ENCOUNTER — Ambulatory Visit (INDEPENDENT_AMBULATORY_CARE_PROVIDER_SITE_OTHER): Payer: Medicare Other | Admitting: Family Medicine

## 2019-10-17 VITALS — BP 116/74 | HR 80 | Temp 97.7°F | Ht 60.5 in | Wt 198.2 lb

## 2019-10-17 DIAGNOSIS — E7800 Pure hypercholesterolemia, unspecified: Secondary | ICD-10-CM

## 2019-10-17 DIAGNOSIS — B86 Scabies: Secondary | ICD-10-CM | POA: Insufficient documentation

## 2019-10-17 DIAGNOSIS — G4730 Sleep apnea, unspecified: Secondary | ICD-10-CM

## 2019-10-17 DIAGNOSIS — I10 Essential (primary) hypertension: Secondary | ICD-10-CM | POA: Diagnosis not present

## 2019-10-17 MED ORDER — PERMETHRIN 5 % EX CREA: TOPICAL_CREAM | CUTANEOUS | 1 refills | Status: DC

## 2019-10-17 NOTE — Patient Instructions (Signed)
Sarna en los adultos Scabies, Adult  La sarna es una afeccin cutnea que se produce cuando insectos muy pequeos se introducen debajo de la piel (infestacin). Esto causa una erupcin cutnea y picazn intensa. La sarna puede transmitirse de una persona a otra (es contagiosa). Si una persona tiene sarna, es frecuente que los dems miembros de la familia tambin contraigan la afeccin. Los sntomas suelen desaparecer en el trmino de 2 a 4semanas con el tratamiento adecuado. Por lo general, la sarna no causa problemas crnicos. Cules son las causas? La causa de esta afeccin son pequeos caros (Sarcoptes scabiei o aradores de la sarna) que pueden verse solamente con un microscopio. Los caros se introducen en la capa superior de la piel y ponen huevos. La sarna puede transmitirse de una persona a otra a travs de lo siguiente:  El contacto cercano con una persona que tiene sarna.  El uso compartido o el contacto con elementos infectados, como toallas, sbanas o ropa. Qu incrementa el riesgo? Los siguientes factores pueden hacer que sea ms propensa a desarrollar esta afeccin:  Vivir en un hogar de ancianos u otro centro de cuidados prolongados.  Tener contacto sexual con un compaero que tiene sarna.  Cuidar a otras personas que corren un riesgo elevado de tener sarna. Cules son los signos o los sntomas? Los sntomas de esta afeccin incluyen:  Picazn intensa. La picazn generalmente empeora por la noche.  Una erupcin cutnea con pequeos bultos rojos o ampollas. La erupcin cutnea suele aparecer en las manos, las muecas, los codos, las axilas, el pecho, la cintura, la ingle o las nalgas. Los bultos pueden formar una lnea (madriguera) en algunas reas.  Irritacin de la piel. Esta puede incluir lceras o manchas escamosas. Cmo se diagnostica? Esta afeccin se puede diagnosticar en funcin de lo siguiente:  Un examen fsico de la piel.  Una prueba cutnea. El mdico  podra tomar una muestra de la piel afectada (raspado de la piel) y hacerla examinar con un microscopio para detectar la presencia de caros. Cmo se trata? El tratamiento de esta afeccin puede incluir:  Crema o locin con un medicamento para destruir los caros. Este producto se esparce por todo el cuerpo y se deja durante varias horas. Por lo general, un nico tratamiento con crema o locin con medicamento es suficiente para matar todos los caros. En los casos graves, es posible que sea necesario repetir el tratamiento.  Crema con medicamento para aliviar la picazn.  Medicamentos que se toman por boca (va oral) cuya accin es: ? Aliviar la picazn. ? Reducir la hinchazn y el enrojecimiento. ? Destruir los caros. Este tratamiento puede realizarse en casos graves. Siga estas instrucciones en su casa: Medicamentos   Tome o aplquese los medicamentos de venta libre y los recetados como se lo haya indicado el mdico.  Aplique la crema o locin con medicamento como se lo haya indicado el mdico.  No enjuague la crema o locin con medicamento hasta tanto haya transcurrido el tiempo necesario. Cuidado de la piel   Evite rascarse las zonas de la piel que estn afectadas.  Mantenga bien cortas las uas de las manos para reducir las lesiones que se producen al rascarse.  Para aliviar la picazn, tome baos fros o aplquese paos fros en la piel. Instrucciones generales  Lave todos los objetos con los que haya tenido contacto reciente, entre ellos, la ropa de cama, las prendas de vestir y los muebles. Haga esto el mismo da que comience el tratamiento. ?   Seque los elementos limpios o use agua caliente para lavar los elementos. Seque los elementos en el ciclo de aire seco caliente. ? Coloque en bolsas de plstico hermticas durante al menos 3das los objetos que no se puedan lavar. Los caros no sobreviven ms de 3das alejados de la [employer]. ? Pase la aspiradora por los [employer]  y los colchones que [place].  Asegrese de que un mdico examine a las dems personas que puedan haberse infestado. Esto incluye a los miembros de su familia y a Insurance claims handler que pueda haber tenido contacto con los objetos infestados.  Concurra a todas las visitas de seguimiento como se lo haya indicado el mdico. Esto es importante. Comunquese con un mdico si:  La picazn no desaparece despus de 4semanas de tratamiento.  Le siguen apareciendo nuevos bultos o surcos.  Tiene enrojecimiento, hinchazn o dolor en la zona de la erupcin cutnea despus del tratamiento.  Observa lquido, sangre o pus que salen de la erupcin cutnea. Resumen  La sarna es una afeccin de la piel que causa erupcin cutnea y picazn intensa.  La causa de esta afeccin son caros diminutos que penetran en la capa superior de la piel y ponen huevos.  La sarna puede transmitirse de Julie Dougherty persona a otra.  Siga los tratamientos que le haya recomendado el mdico.  Limpie todos los elementos con los que haya tenido contacto recientemente. Esta informacin no tiene Marine scientist el consejo del mdico. Asegrese de hacerle al mdico cualquier pregunta que tenga. Document Revised: 12/28/2017 Document Reviewed: 12/28/2017 Elsevier Patient Education  2020 [place] de deteccin de la apnea del sueo Screening for Sleep Apnea  La apnea del sueo es una afeccin en la que la respiracin se detiene o se hace superficial durante el sueo. El estudio de deteccin de la apnea del sueo se realiza para determinar si una persona est en riesgo de tener apnea del sueo. El estudio es sencillo y solo dura unos minutos. El mdico puede solicitar este estudio como preparacin para una ciruga o como parte de un examen fsico. Cules son los sntomas de la apnea del sueo? Algunos de los sntomas comunes de la apnea del sueo son los siguientes:  Ronquidos.  Sueo agitado.  [place].  Pausas en la respiracin.  Ahogos durante el sueo.  Irritabilidad.  Olvidos.  Dificultad para pensar claramente.  Depresin.  Cambios en la personalidad. La mayora de las personas con apnea del sueo no saben que la tienen. Por qu debera hacerme el estudio? Realizarse el estudio para la deteccin de la apnea del sueo puede ayudar a:  Engineer, materials su seguridad. Es importante que sus mdicos sepan si tiene apnea del sueo o no, especialmente si se va a someter a [place] o tiene [place] afeccin de salud de largo plazo (crnica).  Mejorar su salud y permitirle descansar mejor por la noche. Un sueo reparador puede ayudarle a: ? Tener ms energa. ? Adelgazar. ? Mejorar la presin arterial alta. ? Mejorar el control de la diabetes. ? Prevenir un accidente cerebrovascular. ? Prevenir los accidentes automovilsticos. Cmo se realiza este estudio? En general, el estudio [employer] serie de preguntas sobre la calidad de su sueo. Algunas de las preguntas que pueden hacerle son las siguientes:  [place]?  Duerme mal por la noche?  [place]?  Su pareja o cnyuge le han dicho que deja de respirar [place] duerme?  Ha tenido dificultad para concentrarse o  prdida de la memoria? Si el resultado del estudio es positivo, est en riesgo de tener esta afeccin. Es posible que sean necesarios otros estudios para confirmar el diagnstico de apnea del sueo. Dnde encontrar ms informacin Puede encontrar recursos para la deteccin de la apnea del sueo en Internet o en su centro mdico. Si desea obtener ms informacin sobre la deteccin de la apnea del sueo y un sueo saludable, visite estos sitios web:  Centros para Building surveyor y la Prevencin de Probation officer for Disease Control and Prevention, CDC): LearningDermatology.pl  Asociacin [place]oun[place]ense de la Apnea del Chiropodist (American Sleep Apnea Association):  www.sleepapnea.org Comunquese con un mdico si:  Piensa que puede tener apnea del sueo. Resumen  El estudio de deteccin de la apnea del sueo puede ayudar a determinar si una persona est en riesgo de tener apnea del sueo.  Es importante que sus mdicos sepan si tiene apnea del sueo o no, especialmente si se va a someter a [place] o tiene [place] afeccin de salud crnica.  Es posible que le soliciten un estudio de deteccin de la apnea del sueo en preparacin para una ciruga o como parte de un examen fsico. Esta informacin no tiene Marine scientist el consejo del mdico. Asegrese de hacerle al mdico cualquier pregunta que tenga. Document Revised: 10/11/2016 Document Reviewed: 10/11/2016 Elsevier Patient Education  [place]. Permethrin skin cream Qu es este medicamento? La crema de PERMETRINA para la piel se utiliza en el tratamiento de la sarna. Este medicamento puede ser utilizado para otros usos; si tiene alguna pregunta consulte con su proveedor de atencin mdica o con su farmacutico. MARCAS COMUNES: Acticin, Elimite Qu le debo informar a mi profesional de la salud antes de tomar este medicamento? Necesita saber si usted presenta alguno de los siguientes problemas o situaciones:  asma  una reaccin alrgica o inusual a la permetrina, a los insecticidas veterinarios o domsticos, a otros medicamentos, crisantemos, alimentos, colorantes o conservantes  si est embarazada o buscando quedar embarazada  si est amamantando a un beb Cmo debo [employer]? Este medicamento es slo para uso externo. No lo ingiera por va oral. Siga las instrucciones de la etiqueta del medicamento. Se recomienda que NO se bae ni se duche antes de [employer]. Friccione cuidadosamente toda la superficie de la piel, desde la cabeza hasta las plantas de los pies, con la crema. Es importante aplicar la crema en todo el cuerpo y no solamente en el  lugar de la erupcin. Aplique la crema entre los pliegues de los dedos de manos y pies, en los pliegues de la mueca y la cintura, en la hendidura de las [place], en los genitales y en el ombligo. Utilice un palillo para aplicar la crema debajo de las uas de los pies y [place]. Se deben cortar bien las uas. Si tiene [employer] o no tiene, o si le est aplicando crema a un beb o nio pequeo, asegrese de friccionar el cuello, cuero cabelludo, el punto de nacimiento del cabello, las sienes y la frente con la crema. Permita que la crema acte sobre la piel durante 8 a 14 horas y luego bese y lvese con champ para Community education officer. Si le aplica este medicamento a otra persona, use guantes plsticos o descartables para protegerse de la infestacin. Evite que el medicamento entre en contacto con los ojos. Si esto ocurriese, enjuguelos con agua inmediatamente. Hable con su pediatra para informarse acerca del uso de este medicamento en nios.  Aunque este medicamento ha sido recetado a nios tan menores como de 2 meses de edad para condiciones selectivas, las precauciones se aplican. Sobredosis: Pngase en contacto inmediatamente con un centro toxicolgico o una sala de urgencia si usted cree que haya tomado demasiado medicamento. ATENCIN: [employer] es solo para usted. No comparta este medicamento con nadie. Qu sucede si me olvido de una dosis? No se aplica en este caso. Qu puede interactuar con este medicamento? No se esperan interacciones. No utilice otros productos de la piel sobre la zona afectada sin Teacher, adult education a su mdico o a su profesional de Technical sales engineer. Puede ser que esta lista no menciona todas las posibles interacciones. Informe a su profesional de [employer]	[place] de [employer] productos a base de hierbas, medicamentos de [place]lim o suplementos nutritivos que est tomando. Si usted fuma, consume bebidas alcohlicas o si utiliza drogas ilegales, indqueselo tambin a su profesional de [employer]	[place]. Algunas  sustancias pueden interactuar con su medicamento. A qu debo estar atento al usar [employer]? No es inusual que la picazn y la erupcin perduren durante 2 a 4 semanas despus de Advice worker. Estos sntomas pueden ser Julie Dougherty reaccin temporal a los restos de caros. Esto no significa que la crema no acto o que sea necesario aplicarla nuevamente. Si siente que la picazn y la erupcin son intensas o si continan durante ms de 4 semanas, consulte con su mdico o con su profesional de la salud inmediatamente. La sarna se contagia por el contacto directo de la piel con una persona infectada. Los [employer] de la familia y su pareja pueden necesitar tratamiento con este medicamento. Debe consultar sobre ello con su mdico o su profesional de Technical sales engineer. Debe lavar, con un ciclo de lavado normal, toda la ropa, la ropa de cama y las toallas que hayan estado en contacto con su piel. No necesita lavar nuevamente la ropa limpia que todava no haya usado. No es necesario limpiar abrigos, muebles, alfombras, pisos o paredes de Charity fundraiser especial. Qu efectos secundarios puedo tener al [place] medicamento? Efectos secundarios que generalmente no requieren atencin mdica (infrmelos a su mdico o a su profesional de la salud si persisten o si son molestos): picazn entumecimiento erupcin cutnea enrojecimiento o hinchazn leve de la piel escozor o ardor sensacin de hormigueo Puede ser que esta lista no menciona todos los posibles efectos secundarios. Comunquese a su mdico por asesoramiento mdico [employer]. Usted puede informar los efectos secundarios a la FDA por telfono al 1-800-FDA-1088. Dnde debo guardar mi medicina? Mantngala fuera del alcance de los nios. Gurdela a temperatura ambiente, lejos del calor y de Nature conservation officer. No la mantenga refrigerada ni la congele. Deseche todos los medicamentos que no haya utilizado, despus de la fecha de  vencimiento. ATENCIN: Este folleto es un resumen. Puede ser que no cubra toda la posible informacin. Si usted tiene preguntas acerca de esta medicina, consulte con su mdico, su farmacutico o su profesional de Technical sales engineer.  2020 Elsevier/Gold Standard (2015-05-03 00:00:00)

## 2019-10-17 NOTE — Progress Notes (Signed)
Established Patient Office Visit  Subjective:  Patient ID: Julie Dougherty, female    DOB: 1950-09-09  Age: 69 y.o. MRN: [id number]  CC:  Chief Complaint  Patient presents with  . Acute Visit    rash on arm & chest x 2 months.    HPI Julie Dougherty presents for treatment evaluation of a 2 to 45-month history of rash on her trunk arms and legs.  Interdigital spaces and private are not affected.  There are tiny bumps that eventually healed but return in other places.  She lives alone and other family members are not affected.  Rash started after she visited her mother in [place]  Mom was also affected.  She has tried no medicines for this.  She denies new skin contacts such as soaps lotions or detergents.  She continues to snore loudly and wakes up throughout the night.  Blood pressure is well controlled with Lotrel and she has no issues taking it.  Past Medical History:  Diagnosis Date  . Arthritis   . Cataract   . Chronic constipation   . Diverticulitis   . Diverticulosis   . Fatty liver   . GERD (gastroesophageal reflux disease)   . Hemorrhoids, internal, with bleeding Gr 2 prolapsed 06/30/2011   Found on colonoscopy 2011 and anoscopy May 2013   . Hepatitis    "B"    no rx >10 yrs  . Hiatal hernia   . High cholesterol   . Hypertension   . Internal hemorrhoids   . OSA (obstructive sleep apnea)    does not use CPAP  . Pre-diabetes   . PUD (peptic ulcer disease)   . Rectal ulceration 04/27/2009   Associated reactive/regenerative changes and fibromuscular extensions into lamina propria/Mucosal Prolapse  . Sleep apnea    Waiting on CPAP machine    Past Surgical History:  Procedure Laterality Date  . ABDOMINAL HYSTERECTOMY    . CATARACT EXTRACTION Right 03/2018  . COLONOSCOPY  04/27/09    POLYPOID FRAGMENT OF COLONIC MUCOSA WITH SURFACE  . EVALUATION UNDER ANESTHESIA WITH HEMORRHOIDECTOMY N/A 03/21/2019   Procedure: OPEN HEMORRHOIDECTOMY;   Surgeon: Armandina Gemma, MD;  Location: [facility];  Service: General;  Laterality: N/A;  . HEMORRHOID BANDING    . 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 TOTAL KNEE ARTHROPLASTY Right 03/06/2017   Procedure: RIGHT TOTAL KNEE ARTHROPLASTY;  Surgeon: Mcarthur Rossetti, MD;  Location: [facility];  Service: Orthopedics;  Laterality: Right;  . TUBAL LIGATION      Family History  Problem Relation Age of Onset  . Diabetes Sister   . Asthma Sister   . Hyperlipidemia Sister   . Hypertension Sister   . Colon cancer Father 55  . Cancer Father   . Arthritis Mother   . Hypertension Mother   . Depression Sister   . Mental illness Sister   . Esophageal cancer Neg Hx   . Pancreatic cancer Neg Hx   . Stomach cancer Neg Hx     Social History   Socioeconomic History  . Marital status: Single    Spouse name: Not on file  . Number of children: 3  . Years of education: Not on file  . Highest education level: Not on file  Occupational History  . Not on file  Tobacco Use  . Smoking status:  Former Smoker  . Smokeless tobacco: Never Used  . 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 Vaping Use: Never used  Substance and Sexual Activity  . Alcohol use: No    Comment: seldom  . Drug use: No  . Sexual activity: Not on file  Other Topics Concern  . Not on file  Social History Narrative  . Not on file   Social Determinants of Health   Financial Resource Strain: Low Risk   . Difficulty of Paying Living Expenses: Not hard at all  Food Insecurity: No Food Insecurity  . Worried About Charity fundraiser in the Last Year: Never true  . Ran Out of Food in the Last Year: Never true  Transportation Needs: No Transportation Needs  . Lack of Transportation (Medical): No  . Lack of Transportation (Non-Medical): No  Physical  Activity: Inactive  . Days of Exercise per Week: 0 days  . Minutes of Exercise per Session: 0 min  Stress: No Stress Concern Present  . Feeling of Stress : Not at all  Social Connections: Moderately Integrated  . Frequency of Communication with Friends and Family: More than three times a week  . Frequency of Social Gatherings with Friends and Family: More than three times a week  . Attends Religious Services: More than 4 times per year  . Active Member of Clubs or Organizations: Yes  . Attends Archivist Meetings: More than 4 times per year  . Marital Status: Never married  Intimate Partner Violence:   . Fear of Current or Ex-Partner: Not on file  . Emotionally Abused: Not on file  . Physically Abused: Not on file  . Sexually Abused: Not on file    Outpatient Medications Prior to Visit  Medication Sig Dispense Refill  . Alum & Mag Hydroxide-Simeth (GI COCKTAIL) SUSP suspension Take 30 mLs by mouth every 6 (six) hours as needed for indigestion. Shake well. 600 mL 3  . amLODipine-benazepril (LOTREL) 5-20 MG capsule Take 1 capsule by mouth daily. 90 capsule 1  . Ascorbic Acid (VITAMIN C) 1000 MG tablet Take 1,000 mg by mouth daily.    Marland Kitchen aspirin EC 81 MG tablet Take 81 mg by mouth daily.    . cetirizine (ZYRTEC) 10 MG tablet Take 10 mg by mouth daily.    . cholecalciferol (VITAMIN D3) 25 MCG (1000 UNIT) tablet Take 1,000 Units by mouth daily. Unsure of dosage     . cyclobenzaprine (FLEXERIL) 5 MG tablet Take 1 tablet (5 mg total) by mouth 3 (three) times daily as needed for muscle spasms. 30 tablet 0  . omega-3 fish oil (MAXEPA) 1000 MG CAPS capsule Take 1 mg by mouth.    . pantoprazole (PROTONIX) 40 MG tablet Take 1 tablet (40 mg total) by mouth 2 (two) times daily before a meal. About 30 minutes before breakfast and supper 180 tablet 3  . pravastatin (PRAVACHOL) 40 MG tablet Take 1 tablet (40 mg total) by mouth daily. 90 tablet 3   No facility-administered medications prior to  visit.    Allergies  Allergen Reactions  . Ciprofloxacin Hives and Nausea Only  . Flagyl [Metronidazole] Hives and Nausea Only    ROS Review of Systems  Constitutional: Negative.   HENT: Negative.   Respiratory: Positive for apnea. Negative for chest tightness, shortness of breath and wheezing.   Cardiovascular: Negative.   Gastrointestinal: Negative.   Endocrine: Negative for polyphagia and polyuria.  Skin: Positive for color change.  Neurological: Negative for speech difficulty, light-headedness and headaches.  Hematological: Does not bruise/bleed easily.  Psychiatric/Behavioral: Positive for sleep disturbance.      Objective:    Physical Exam  BP 116/74   Pulse 80   Temp 97.7 F (36.5 C) (Temporal)   Ht 5' 0.5" (1.537 m)   Wt 198 lb 3.2 oz (89.9 kg)   SpO2 96%   BMI 38.07 kg/m  Wt Readings from Last 3 Encounters:  10/17/19 198 lb 3.2 oz (89.9 kg)  10/15/19 198 lb (89.8 kg)  10/01/19 198 lb (89.8 kg)     Health Maintenance Due  Topic Date Due  . DEXA SCAN  Never done  . PNA vac Low Risk Adult (1 of 2 - PCV13) Never done  . INFLUENZA VACCINE  08/31/2019    There are no preventive care reminders to display for this patient.  Lab Results  Component Value Date   TSH 1.45 06/04/2019   Lab Results  Component Value Date   WBC 6.1 06/04/2019   HGB 13.4 06/04/2019   HCT 41.6 06/04/2019   MCV 88.3 06/04/2019   PLT 241.0 06/04/2019   Lab Results  Component Value Date   NA 137 06/04/2019   K 4.4 06/04/2019   CO2 27 06/04/2019   GLUCOSE 95 06/04/2019   BUN 15 06/04/2019   CREATININE 0.99 06/04/2019   BILITOT 0.5 06/04/2019   ALKPHOS 69 06/04/2019   AST 14 06/04/2019   ALT 15 06/04/2019   PROT 7.3 06/04/2019   ALBUMIN 4.2 06/04/2019   CALCIUM 9.1 06/04/2019   ANIONGAP 14 03/07/2017   GFR 55.75 (L) 06/04/2019   Lab Results  Component Value Date   CHOL 199 06/04/2019   Lab Results  Component Value Date   HDL 55.10 06/04/2019   Lab Results    Component Value Date   LDLCALC 115 (H) 06/04/2019   Lab Results  Component Value Date   TRIG 144.0 06/04/2019   Lab Results  Component Value Date   CHOLHDL 4 06/04/2019   Lab Results  Component Value Date   HGBA1C 5.7 01/02/2018      Assessment & Plan:   Problem List Items Addressed This Visit      Respiratory   Sleep apnea - Primary   Relevant Orders   Ambulatory referral to Sleep Studies     Musculoskeletal and Integument   Scabies   Relevant Medications   permethrin (ELIMITE) 5 % cream      Meds ordered this encounter  Medications  . permethrin (ELIMITE) 5 % cream    Sig: Apply to entire body from neck down and wash off in 12-14 hours. Repeat in 2 weeks.    Dispense:  60 g    Refill:  1    Follow-up: No follow-ups on file.  Continue Lotrel for hypertension.  Referral for sleep study.  Apply Elimite today and then again in 2 weeks.  If rash is not improved she will let me know we will refer to dermatology.  Given information in Spanish on scabies, Elimite and sleep apnea. [name], MD

## 2019-10-20 ENCOUNTER — Other Ambulatory Visit: Payer: Self-pay

## 2019-10-20 ENCOUNTER — Ambulatory Visit (HOSPITAL_COMMUNITY)
Admission: RE | Admit: 2019-10-20 | Discharge: 2019-10-20 | Disposition: A | Discharge status: Home / Self Care | Payer: Medicare Other | Source: Ambulatory Visit | Attending: Physician Assistant | Admitting: Physician Assistant

## 2019-10-20 DIAGNOSIS — R1011 Right upper quadrant pain: Secondary | ICD-10-CM | POA: Diagnosis not present

## 2019-10-20 DIAGNOSIS — K760 Fatty (change of) liver, not elsewhere classified: Secondary | ICD-10-CM | POA: Diagnosis not present

## 2019-10-20 DIAGNOSIS — K30 Functional dyspepsia: Secondary | ICD-10-CM | POA: Insufficient documentation

## 2019-10-20 MED ORDER — AMBULATORY NON FORMULARY MEDICATION: 3 refills | Status: DC

## 2019-10-21 ENCOUNTER — Telehealth: Payer: Self-pay | Admitting: Physician Assistant

## 2019-10-21 ENCOUNTER — Encounter: Payer: Self-pay | Admitting: Surgery

## 2019-10-21 NOTE — Telephone Encounter (Signed)
Called [facility] pharmacy and gave the recipe that was sent in after confirming that it had been cut off when sent through.

## 2019-10-22 ENCOUNTER — Telehealth: Payer: Self-pay

## 2019-10-22 NOTE — Telephone Encounter (Signed)
Please call patient back and sched her for the next available, if she wants something sooner I can add her to the cancellation list.  This appt should have made when she left the appt seeing Jeneen Rinks.

## 2019-10-22 NOTE — Telephone Encounter (Signed)
Patient called she stated Jeneen Rinks told her to call back to discuss MRI Results patient would like a call back [phone]

## 2019-10-23 ENCOUNTER — Ambulatory Visit (INDEPENDENT_AMBULATORY_CARE_PROVIDER_SITE_OTHER): Payer: Medicare Other | Admitting: Surgery

## 2019-10-23 ENCOUNTER — Encounter: Payer: Self-pay | Admitting: Surgery

## 2019-10-23 DIAGNOSIS — M546 Pain in thoracic spine: Secondary | ICD-10-CM | POA: Diagnosis not present

## 2019-10-23 DIAGNOSIS — R1013 Epigastric pain: Secondary | ICD-10-CM | POA: Diagnosis not present

## 2019-10-23 DIAGNOSIS — R1011 Right upper quadrant pain: Secondary | ICD-10-CM | POA: Diagnosis not present

## 2019-10-23 NOTE — Patient Instructions (Signed)
Visit Information It was great speaking with you today!  Please let me know if you have any questions about our visit.  Goals Addressed            This Visit's Progress    Chronic Care Management       CARE PLAN ENTRY (see longitudinal plan of care for additional care plan information)  Current Barriers:   Chronic Disease Management support, education, and care coordination needs related to Hypertension, Hyperlipidemia and GERD    Hypertension BP Readings from Last 3 Encounters:  10/17/19 116/74  10/15/19 (!) 136/92  10/01/19 138/77    Pharmacist Clinical Goal(s): o Over the next 90 days, patient will work with PharmD and providers to achieve BP goal <130/80  Current regimen:  o Amlodipine- Benazepril 5-20 mg daily  Interventions: o Discussed low salt diet and exercising as tolerated extensively o Will initiate blood pressure monitoring plan   Patient self care activities - Over the next 90 days, patient will: o Check Blood Pressure 1-2 times weekly, document, and provide at future appointments o Ensure daily salt intake < 2300 mg/day  Hyperlipidemia Lab Results  Component Value Date/Time   LDLCALC 115 (H) 06/04/2019 11:02 AM   LDLDIRECT 114.0 06/04/2019 11:02 AM    Pharmacist Clinical Goal(s): o Over the next 90 days, patient will work with PharmD and providers to achieve LDL goal < 100  Current regimen:  o Pravastatin 40 mg daily   Interventions: o Discussed low cholesterol diet and exercising as tolerated extensively o Will initiate cholesterol monitoring plan   Medication management  Pharmacist Clinical Goal(s): o Over the next 90 days, patient will work with PharmD and providers to maintain optimal medication adherence  Current pharmacy: [facility]  Interventions o Comprehensive medication review performed. o Continue current medication management strategy  Patient self care activities - Over the next 90 days, patient will: o Take medications as  prescribed o Report any questions or concerns to PharmD and/or provider(s)       [name] was given information about Chronic Care Management services today including:  1. CCM service includes personalized support from designated clinical staff supervised by her physician, including individualized plan of care and coordination with other care providers 2. 24/7 contact phone numbers for assistance for urgent and routine care needs. 3. Standard insurance, coinsurance, copays and deductibles apply for chronic care management only during months in which we provide at least 20 minutes of these services. Most insurances cover these services at 100%, however patients may be responsible for any copay, coinsurance and/or deductible if applicable. This service may help you avoid the need for more expensive face-to-face services. 4. Only one practitioner may furnish and bill the service in a calendar month. 5. The patient may stop CCM services at any time (effective at the end of the month) by phone call to the office staff.  Patient agreed to services and verbal consent obtained.   The patient verbalized understanding of instructions provided today and declined a print copy of patient instruction materials.  Face to Face appointment with pharmacist scheduled for: 10/06/20 at 9:00 AM  [facility]  [phone]

## 2019-10-23 NOTE — Progress Notes (Signed)
69 year old female comes to review thoracic spine MRI scan that was performed 10/15/2019. Family member present to help translate. That report showed:  CLINICAL DATA:  69 year old female with persistent midthoracic, mid back and right side pain for 5 months with no known injury.  EXAM: MRI THORACIC SPINE WITHOUT CONTRAST  TECHNIQUE: Multiplanar, multisequence MR imaging of the thoracic spine was performed. No intravenous contrast was administered.  COMPARISON:  Thoracic spine radiographs 09/08/2019.  FINDINGS: Limited cervical spine imaging:  Negative.  Thoracic spine segmentation:  Normal on the comparison.  Alignment: Stable thoracic kyphosis seen last month. No spondylolisthesis.  Vertebrae: Confluent marrow and/or paraspinal soft tissue edema is associated with a rather bulky right anterior T7-T8 endplate osteophyte (series 5, image 6). Faint degenerative marrow edema also in the anterior T7 vertebral body. But normal background bone marrow signal. And no other marrow edema or evidence of acute osseous abnormality.  Cord:  Normal.  Normal conus medullaris at T12-L1.  Paraspinal and other soft tissues: Normal thoracic paraspinal soft tissues aside from those adjacent to the anterior T7-T8 endplate osteophyte described above. No paraspinal fluid collection. There is a small to moderate gastric hiatal hernia. Otherwise negative visible chest and upper abdominal viscera.  Disc levels:  Multilevel right anterolateral degenerative endplate osteophytosis, T5 through T10 as seen on radiographs last month.  But otherwise no age advanced thoracic spine degeneration. No significant disc degeneration. No thoracic spinal stenosis. No thoracic neural foraminal stenosis.  IMPRESSION: 1. Positive for reactive edema in and around bulky right anterior endplate osteophytes of the T7-T8 vertebrae. This appears degenerative in nature, and occurs on a background  of degenerative endplate spurring from T5 through T10.  2. But otherwise largely normal for age thoracic spine. No significant disc disease. No spinal or foraminal stenosis.  3. Small to moderate gastric hiatal hernia.   Electronically Signed   By: Genevie Ann M.D.   On: 10/15/2019 18:42   After reviewing the study patient describes having more of a mechanical right-sided mid back pain. Aggravated with certain movements but the epigastric and right upper quadrant pain can be constant at times and not associated with the thoracic mechanical symptoms. Patient recently had abdominal ultrasound but has not followed up with the gastroenterologist yet.  Plan With the help of patient's daughter advised that she should definitely keep appointment upcoming with the GI specialist. With the hiatal hernia that she has it may not be a bad idea for primary care to refer her to general surgery at least to get their opinion on this matter. Patient is describing a fair amount of symptoms that may be related to this. I do not recommend that she take any Tylenol since fatty liver was seen on her ultrasound. Also do not recommend that she take any oral NSAIDs. Patient's daughter states that when she does take ibuprofen that it does upset her stomach. We discussed proper body mechanics and trying to avoid certain movements that may aggravate her back pain. All questions answered. Dr. Lorin Mercy was in the [facility] clinic today and I did speak with him and asked him to review the thoracic MRI. He agrees with plan that has been given. Again I advised patient that there is not anything on her MRI from an orthopedic standpoint that would cause the epigastric and right upper quadrant pain.

## 2019-11-14 ENCOUNTER — Other Ambulatory Visit: Payer: Self-pay | Admitting: Family Medicine

## 2019-11-14 DIAGNOSIS — I10 Essential (primary) hypertension: Secondary | ICD-10-CM

## 2019-11-15 ENCOUNTER — Other Ambulatory Visit: Payer: Self-pay

## 2019-11-15 ENCOUNTER — Ambulatory Visit: Payer: Medicare Other | Attending: Internal Medicine

## 2019-11-15 DIAGNOSIS — Z23 Encounter for immunization: Secondary | ICD-10-CM

## 2019-11-15 NOTE — Progress Notes (Signed)
   Covid-19 Vaccination Clinic  Name:  Julie Dougherty    MRN: [id number] DOB: 1951/01/22  11/15/2019  Julie Dougherty was observed post Covid-19 immunization for 15 minutes without incident. She was provided with Vaccine Information Sheet and instruction to access the V-Safe system.   Julie Dougherty was instructed to call 911 with any severe reactions post vaccine: Marland Kitchen Difficulty breathing  . Swelling of face and throat  . A fast heartbeat  . A bad rash all over body  . Dizziness and weakness

## 2019-11-24 ENCOUNTER — Ambulatory Visit: Payer: Medicare Other | Admitting: Family Medicine

## 2019-11-27 ENCOUNTER — Encounter: Payer: Self-pay | Admitting: Family Medicine

## 2019-11-28 NOTE — Telephone Encounter (Signed)
Okay to switch?  

## 2019-12-02 ENCOUNTER — Institutional Professional Consult (permissible substitution): Payer: Medicare Other | Admitting: Neurology

## 2019-12-05 ENCOUNTER — Ambulatory Visit: Payer: Medicare Other | Admitting: Family Medicine

## 2019-12-18 ENCOUNTER — Telehealth: Payer: Self-pay | Admitting: Family Medicine

## 2019-12-18 NOTE — Telephone Encounter (Signed)
Pt was no show for appt 11/24/2019 for 6 month f/u. 1st occurrence. Fee waived. Pt has not rescheduled. Letter mailed.

## 2020-01-01 ENCOUNTER — Telehealth: Payer: Self-pay

## 2020-01-01 NOTE — Progress Notes (Addendum)
Chronic Care Management Pharmacy Assistant   Name: Aniylah Avans  MRN: [id number] DOB: [birth year]  Reason for [place] Call.  Allergies:   Allergies  Allergen Reactions   Ciprofloxacin Hives and Nausea Only   Flagyl [Metronidazole] Hives and Nausea Only    Medications: Outpatient Encounter Medications as of 01/01/2020  Medication Sig   AMBULATORY NON FORMULARY MEDICATION Medication Name: GI Cocktail 90 ml viscous lidocaine 90 ml dicyclomine 10 mg/ 5 ml 270 ml Maalox 400 mg SIG 30 ml PO prn stomach pain every 6 hours   amLODipine-benazepril (LOTREL) 5-20 MG capsule Take 1 capsule by mouth once daily   Ascorbic Acid (VITAMIN C) 1000 MG tablet Take 1,000 mg by mouth daily.   aspirin EC 81 MG tablet Take 81 mg by mouth daily.   cetirizine (ZYRTEC) 10 MG tablet Take 10 mg by mouth daily.   cholecalciferol (VITAMIN D3) 25 MCG (1000 UNIT) tablet Take 1,000 Units by mouth daily. Unsure of dosage    cyclobenzaprine (FLEXERIL) 5 MG tablet Take 1 tablet (5 mg total) by mouth 3 (three) times daily as needed for muscle spasms.   omega-3 fish oil (MAXEPA) 1000 MG CAPS capsule Take 1 mg by mouth.   pantoprazole (PROTONIX) 40 MG tablet Take 1 tablet (40 mg total) by mouth 2 (two) times daily before a meal. About 30 minutes before breakfast and supper   permethrin (ELIMITE) 5 % cream Apply to entire body from neck down and wash off in 12-14 hours. Repeat in 2 weeks.   pravastatin (PRAVACHOL) 40 MG tablet Take 1 tablet (40 mg total) by mouth daily.   No facility-administered encounter medications on file as of 01/01/2020.    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 Spoke with patient regarding BP  Recent Office Vitals: BP Readings from Last 3 Encounters:  10/17/19 116/74  10/15/19 (!) 136/92  10/01/19 138/77   Pulse Readings from Last 3 Encounters:  10/17/19 80  10/15/19 91  10/01/19 69    Wt Readings from Last 3 Encounters:  10/17/19 198 lb 3.2 oz (89.9 kg)  10/15/19 198 lb (89.8 kg)  10/01/19 198 lb (89.8 kg)     Kidney Function Lab Results  Component Value Date/Time   CREATININE 0.99 06/04/2019 11:02 AM   CREATININE 0.97 08/30/2018 10:10 AM   CREATININE 1.04 (H) 10/22/2015 08:57 AM   GFR 55.75 (L) 06/04/2019 11:02 AM   GFRNONAA >60 03/07/2017 06:42 AM   GFRNONAA 57 (L) 10/22/2015 08:57 AM   GFRAA >60 03/07/2017 06:42 AM   GFRAA 66 10/22/2015 08:57 AM    BMP Latest Ref Rng & Units 06/04/2019 08/30/2018 07/16/2018  Glucose 70 - 99 mg/dL 95  106(H) 101(H)  BUN 6 - 23 mg/dL 15 10 15   Creatinine 0.40 - 1.20 mg/dL 0.99 0.97 0.97  Sodium 135 - 145 mEq/L 137 139 137  Potassium 3.5 - 5.1 mEq/L 4.4 4.2 5.0  Chloride 96 - 112 mEq/L 104 103 104  CO2 19 - 32 mEq/L 27 28 26   Calcium 8.4 - 10.5 mg/dL 9.1 9.6 9.6    Current antihypertensive regimen:  Amlodipine- Benazepril 5-20 mg daily How often are you checking your Blood Pressure?  Patient daughter states patients does not check her blood pressure at home. I Informed patient daughter per pervious visit with clinical pharmacist if she could start checking her blood pressure and to call me back with  the readings.Patient daughter argeed, and will begin checking her blood pressure at home. Current home BP readings: None ID What recent interventions/DTPs have been made by any provider to improve Blood Pressure control since last CPP Visit: None ID Any recent hospitalizations or ED visits since last visit with CPP? No What diet changes have been made to improve Blood Pressure Control?  Patient daughter states patient cooks at home using salt for taste. Spinach,greens,salads,chicken, and fish. What exercise is being done to improve your Blood Pressure Control?  Patient daughter states patient does not exercise at this time.  Adherence Review: Is the patient currently on ACE/ARB medication? Yes Does the patient have >5 day gap between last estimated fill dates? Yes   [place] Pharmacist Assistant [phone]

## 2020-02-05 ENCOUNTER — Other Ambulatory Visit: Payer: Self-pay | Admitting: Family Medicine

## 2020-02-05 DIAGNOSIS — I10 Essential (primary) hypertension: Secondary | ICD-10-CM

## 2020-02-19 ENCOUNTER — Encounter: Payer: Self-pay | Admitting: Family Medicine

## 2020-03-09 ENCOUNTER — Telehealth: Payer: Self-pay | Admitting: Family Medicine

## 2020-03-09 NOTE — Telephone Encounter (Signed)
Pt has had to be rescheduled a few times and wanted to know if she can transfer to Dr Bryan Lemma, Is this Okay?

## 2020-03-09 NOTE — Telephone Encounter (Signed)
Unfortunately, I am not able to accommodate this as I work part time and have had a large influx of new/TOC pts. Maybe Dr. Gena Fray?

## 2020-03-17 DIAGNOSIS — Z1231 Encounter for screening mammogram for malignant neoplasm of breast: Secondary | ICD-10-CM | POA: Diagnosis not present

## 2020-03-17 NOTE — Telephone Encounter (Signed)
[place] called in asking about TOC. Advised that Dr. Bryan Lemma is not able to accept at this time (see below). Offered Dr. [name] but she declined and asked for ph# to [place].

## 2020-03-17 NOTE — Telephone Encounter (Signed)
Julie Dougherty called in and was advised that prior referral was sent to [facility] and pt was no show for visit in 12/2019 with Dr. Brett Fairy. She said she was not aware.

## 2020-03-22 LAB — HM MAMMOGRAPHY

## 2020-04-02 ENCOUNTER — Encounter: Payer: Self-pay | Admitting: Family Medicine

## 2020-04-02 ENCOUNTER — Other Ambulatory Visit: Payer: Self-pay

## 2020-04-02 ENCOUNTER — Ambulatory Visit (INDEPENDENT_AMBULATORY_CARE_PROVIDER_SITE_OTHER): Payer: Medicare Other | Admitting: Physician Assistant

## 2020-04-02 ENCOUNTER — Encounter: Payer: Self-pay | Admitting: Physician Assistant

## 2020-04-02 VITALS — BP 94/68 | HR 80 | Ht 60.5 in | Wt 203.4 lb

## 2020-04-02 DIAGNOSIS — K5792 Diverticulitis of intestine, part unspecified, without perforation or abscess without bleeding: Secondary | ICD-10-CM

## 2020-04-02 DIAGNOSIS — M940 Chondrocostal junction syndrome [Tietze]: Secondary | ICD-10-CM

## 2020-04-02 DIAGNOSIS — K219 Gastro-esophageal reflux disease without esophagitis: Secondary | ICD-10-CM | POA: Diagnosis not present

## 2020-04-02 MED ORDER — PANTOPRAZOLE SODIUM 40 MG PO TBEC: 40.0000 mg | DELAYED_RELEASE_TABLET | Freq: Two times a day (BID) | ORAL | 3 refills | Status: DC

## 2020-04-02 MED ORDER — AMOXICILLIN-POT CLAVULANATE 875-125 MG PO TABS: 1.0000 | ORAL_TABLET | Freq: Two times a day (BID) | ORAL | 0 refills | Status: DC

## 2020-04-02 NOTE — Patient Instructions (Signed)
We have sent the following medications to your pharmacy for you to pick up at your convenience: Augmentin, Protonix  Apply moist heat to the right ribs and buy over the counter Lidocaine ointment and apply it 2 to 3 times daily to right ribs as needed.  Call us back and speak to the nurse if symptoms do not improve when finished with the antibiotics.    I appreciate the opportunity to care for you. Amy Esterwood, PA-C

## 2020-04-02 NOTE — Progress Notes (Signed)
Subjective:    Patient ID: Julie Dougherty, female    DOB: July 15, 1950, 70 y.o.   MRN: [id number]  [place] is a pleasant 70 year old Hispanic female, established with Dr. Carlean Purl who comes in today after 2-week history of left lower quadrant pain.  Patient is non-English-speaking and her daughter who accompanies her interprets.  She says that she had been having some constant discomfort in the left lower quadrant over the past couple of weeks which has actually improved a bit over the past 2 days.  No associated fever or chills, bowel movements have been fairly normal, no melena or hematochezia.  No nausea or vomiting.  Patient denies any dysuria or hematuria.  She has had prior diverticulitis and symptoms are reminiscent. She also mentions some ongoing discomfort along the right anterior costal margin.  She had been seen in the past with costochondritis type symptoms and her daughter says this has been an ongoing intermittent issue. Patient also has chronic GERD and previously documented grade B esophagitis and a 5 cm hiatal hernia on EGD done in August 2021.  Currently stable on twice daily Protonix.  Colonoscopy was done in August 2021 showing multiple diverticuli and a diminutive polyp in the sigmoid colon which was a tubular adenoma.    Review of Systems Pertinent positive and negative review of systems were noted in the above HPI section.  All other review of systems was otherwise negative.  Outpatient Encounter Medications as of 04/02/2020  Medication Sig  . AMBULATORY NON FORMULARY MEDICATION Medication Name: GI Cocktail 90 ml viscous lidocaine 90 ml dicyclomine 10 mg/ 5 ml 270 ml Maalox 400 mg SIG 30 ml PO prn stomach pain every 6 hours  . amLODipine-benazepril (LOTREL) 5-20 MG capsule Take 1 capsule by mouth once daily  . amoxicillin-clavulanate (AUGMENTIN) 875-125 MG tablet Take 1 tablet by mouth 2 (two) times daily. Take with food  . Ascorbic Acid (VITAMIN C) 1000 MG tablet  Take 1,000 mg by mouth daily.  Marland Kitchen aspirin EC 81 MG tablet Take 81 mg by mouth daily.  . cetirizine (ZYRTEC) 10 MG tablet Take 10 mg by mouth daily.  . cholecalciferol (VITAMIN D3) 25 MCG (1000 UNIT) tablet Take 1,000 Units by mouth daily. Unsure of dosage   . cyclobenzaprine (FLEXERIL) 5 MG tablet Take 1 tablet (5 mg total) by mouth 3 (three) times daily as needed for muscle spasms.  Marland Kitchen omega-3 fish oil (MAXEPA) 1000 MG CAPS capsule Take 1 mg by mouth.  . pantoprazole (PROTONIX) 40 MG tablet Take 1 tablet (40 mg total) by mouth 2 (two) times daily before a meal. About 30 minutes before breakfast and supper  . permethrin (ELIMITE) 5 % cream Apply to entire body from neck down and wash off in 12-14 hours. Repeat in 2 weeks.  . pravastatin (PRAVACHOL) 40 MG tablet Take 1 tablet (40 mg total) by mouth daily.  . [DISCONTINUED] pantoprazole (PROTONIX) 40 MG tablet Take 1 tablet (40 mg total) by mouth 2 (two) times daily before a meal. About 30 minutes before breakfast and supper   No facility-administered encounter medications on file as of 04/02/2020.   Allergies  Allergen Reactions  . Ciprofloxacin Hives and Nausea Only  . Flagyl [Metronidazole] Hives and Nausea Only   Patient Active Problem List   Diagnosis Date Noted  . Scabies 10/17/2019  . Pain in both hands 06/04/2019  . Chest wall muscle strain 06/04/2019  . Sleep apnea 03/26/2018  . History of hepatitis B 03/26/2018  .  Healthcare maintenance 03/26/2018  . 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 Status post total knee replacement, left 08/29/2016  . Baker's cyst of knee, left - suspected 05/30/2016  . Diverticulitis of colon 03/17/2016  . Hemorrhoids, internal, with bleeding Gr 2 prolapsed 06/30/2011  . Diverticulitis 06/06/2011  . Rectal bleeding 04/19/2009  . ABDOMINAL PAIN, RIGHT LOWER QUADRANT 04/19/2009  . Elevated cholesterol 07/11/2006  . Essential hypertension 07/11/2006  . GERD 07/11/2006  . HEPATITIS B, HX  OF 07/11/2006   Social History   Socioeconomic History  . Marital status: Single    Spouse name: Not on file  . Number of children: 3  . Years of education: Not on file  . Highest education level: Not on file  Occupational History  . Not on file  Tobacco Use  . Smoking status: Former Research scientist (life sciences)  . Smokeless tobacco: Never Used  . 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 Vaping Use: Never used  Substance and Sexual Activity  . Alcohol use: No    Comment: seldom  . Drug use: No  . Sexual activity: Not on file  Other Topics Concern  . Not on file  Social History Narrative  . Not on file   Social Determinants of Health   Financial Resource Strain: Low Risk   . Difficulty of Paying Living Expenses: Not hard at all  Food Insecurity: No Food Insecurity  . Worried About Charity fundraiser in the Last Year: Never true  . Ran Out of Food in the Last Year: Never true  Transportation Needs: No Transportation Needs  . Lack of Transportation (Medical): No  . Lack of Transportation (Non-Medical): No  Physical Activity: Inactive  . Days of Exercise per Week: 0 days  . Minutes of Exercise per Session: 0 min  Stress: No Stress Concern Present  . Feeling of Stress : Not at all  Social Connections: Moderately Integrated  . Frequency of Communication with Friends and Family: More than three times a week  . Frequency of Social Gatherings with Friends and Family: More than three times a week  . Attends Religious Services: More than 4 times per year  . Active Member of Clubs or Organizations: Yes  . Attends Archivist Meetings: More than 4 times per year  . Marital Status: Never married  Intimate Partner Violence: Not on file    Ms. Rodriguez-de Sierra's family history includes Arthritis in her mother; Asthma in her sister; Cancer in her father; Colon cancer (age of onset: 11) in her father; Depression in her sister; Diabetes in her sister; Hyperlipidemia in her  sister; Hypertension in her mother and sister; Mental illness in her sister.      Objective:    Vitals:   04/02/20 1441  BP: 94/68  Pulse: 80    Physical Exam Well-developed well-nourished  Older Hispanic female  in no acute distress.  Accompanied by her daughter who interprets  height, Weight,203 BMI 39 HEENT; nontraumatic normocephalic, EOMI, PE R LA, sclera anicteric. Oropharynx; not examined today Neck; supple, no JVD Cardiovascular; regular rate and rhythm with S1-S2, no murmur rub or gallop Pulmonary; Clear bilaterally, there is some tenderness along the right costal margin anteriorly and laterally Abdomen; soft,  nondistended, she is tender to palpation deep in the left lower quadrant, no rebound no palpable mass or hepatosplenomegaly, bowel sounds are active Rectal; not done today Skin; benign exam, no jaundice rash or appreciable lesions Extremities; no clubbing cyanosis or  edema skin warm and dry Neuro/Psych; alert and oriented x4, grossly nonfocal mood and affect appropriate       Assessment & Plan:   #74 70 year old female with 2-week history of left lower quadrant pain, tender on exam and with known diverticulosis and prior diverticulitis.  Symptoms are consistent with acute diverticulitis.  #2 history of adenomatous colon polyps-up-to-date with colonoscopy just done August 2021  #3 chronic GERD with history of esophagitis and 5 cm hiatal hernia, symptoms stable on twice daily Protonix. #4 costochondritis right #5 status post bilateral knee replacements 6.  Prior history of hep B 7.  Hypertension  Plan; Patient is allergic/intolerant to Cipro and Flagyl.  We will start Augmentin 875 p.o. twice daily x10 days Patient advised to notify us if symptoms have not completely resolved as she completes the antibiotics or at any point if symptoms worsen. Continue Protonix 40 mg p.o. twice daily For costochondritis suggested moist heat , low-dose NSAID if needed OTC, and  lidocaine ointment OTC to be applied 2-3 times daily as needed.  Nazli Penn Genia Harold PA-C 04/02/2020   Cc: [name],*

## 2020-04-05 ENCOUNTER — Ambulatory Visit: Payer: Medicare Other | Admitting: Family Medicine

## 2020-04-14 ENCOUNTER — Telehealth: Payer: Self-pay | Admitting: Family Medicine

## 2020-04-14 NOTE — Telephone Encounter (Signed)
i am not a full-time provider either. I recommend she maintains Dr. [name] as pcp

## 2020-04-14 NOTE — Telephone Encounter (Signed)
Patient would like to do a TOC from Dr. [name] to [place]. States that she would like to see someone who will be here full time. Explained that Dr. [name] would return to regular schedule in May, but she wants to still transfer (if possible). Please advise if this is okay.

## 2020-04-14 NOTE — Telephone Encounter (Signed)
Okay with me 

## 2020-04-15 NOTE — Telephone Encounter (Signed)
Advised patients daughter that Julie Dougherty was unable to take her (patient) on as a patient. She states that she will discuss this with her mom and see what she wants to do and then she will give the office a call back.

## 2020-05-29 ENCOUNTER — Other Ambulatory Visit: Payer: Self-pay | Admitting: Family Medicine

## 2020-05-29 DIAGNOSIS — I10 Essential (primary) hypertension: Secondary | ICD-10-CM

## 2020-06-02 ENCOUNTER — Encounter: Payer: Self-pay | Admitting: Family Medicine

## 2020-06-02 ENCOUNTER — Telehealth (INDEPENDENT_AMBULATORY_CARE_PROVIDER_SITE_OTHER): Payer: Medicare Other | Admitting: Family Medicine

## 2020-06-02 ENCOUNTER — Other Ambulatory Visit: Payer: Self-pay

## 2020-06-02 ENCOUNTER — Telehealth: Payer: Self-pay | Admitting: Family Medicine

## 2020-06-02 VITALS — Temp 98.5°F | Ht 60.0 in | Wt 200.0 lb

## 2020-06-02 DIAGNOSIS — J4541 Moderate persistent asthma with (acute) exacerbation: Secondary | ICD-10-CM | POA: Diagnosis not present

## 2020-06-02 DIAGNOSIS — J45909 Unspecified asthma, uncomplicated: Secondary | ICD-10-CM | POA: Insufficient documentation

## 2020-06-02 DIAGNOSIS — J22 Unspecified acute lower respiratory infection: Secondary | ICD-10-CM

## 2020-06-02 MED ORDER — ALBUTEROL SULFATE HFA 108 (90 BASE) MCG/ACT IN AERS: 1.0000 | INHALATION_SPRAY | Freq: Four times a day (QID) | RESPIRATORY_TRACT | 0 refills | Status: DC | PRN

## 2020-06-02 MED ORDER — BENZONATATE 200 MG PO CAPS: 200.0000 mg | ORAL_CAPSULE | Freq: Two times a day (BID) | ORAL | 0 refills | Status: DC | PRN

## 2020-06-02 MED ORDER — PREDNISONE 20 MG PO TABS: 20.0000 mg | ORAL_TABLET | Freq: Two times a day (BID) | ORAL | 0 refills | Status: AC

## 2020-06-02 MED ORDER — ALBUTEROL SULFATE HFA 108 (90 BASE) MCG/ACT IN AERS: 1.0000 | INHALATION_SPRAY | Freq: Four times a day (QID) | RESPIRATORY_TRACT | 2 refills | Status: DC | PRN

## 2020-06-02 NOTE — Telephone Encounter (Signed)
Recommended Rx sent in for insurance to cover

## 2020-06-02 NOTE — Addendum Note (Signed)
Addended by: Lynda Rainwater on: 06/02/2020 01:03 PM   Modules accepted: Orders

## 2020-06-02 NOTE — Progress Notes (Signed)
Established Patient Office Visit  Subjective:  Patient ID: Julie Dougherty, female    DOB: 05-02-1950  Age: 70 y.o. MRN: [id number]  CC:  Chief Complaint  Patient presents with  . Cough    Cough x 2 weeks lots of thick yellow mucus. Some wheezing and SOB    HPI Julie Dougherty presents for evaluation and treatment of a 2-week history of cough that is recently productive of purulent phlegm.  She is now feeling tightness in her chest with some wheezing.  She does not have a smoking history.  She did have asthma as a younger person.  He started a Z-Pak 4 days ago.  Past Medical History:  Diagnosis Date  . Arthritis   . Cataract   . Chronic constipation   . Diverticulitis   . Diverticulosis   . Fatty liver   . GERD (gastroesophageal reflux disease)   . Hemorrhoids, internal, with bleeding Gr 2 prolapsed 06/30/2011   Found on colonoscopy 2011 and anoscopy May 2013   . Hepatitis    "B"    no rx >10 yrs  . Hiatal hernia   . High cholesterol   . Hypertension   . Internal hemorrhoids   . OSA (obstructive sleep apnea)    does not use CPAP  . Pre-diabetes   . PUD (peptic ulcer disease)   . Rectal ulceration 04/27/2009   Associated reactive/regenerative changes and fibromuscular extensions into lamina propria/Mucosal Prolapse  . Sleep apnea    Waiting on CPAP machine    Past Surgical History:  Procedure Laterality Date  . ABDOMINAL HYSTERECTOMY    . CATARACT EXTRACTION Right 03/2018  . COLONOSCOPY  04/27/09    POLYPOID FRAGMENT OF COLONIC MUCOSA WITH SURFACE  . EVALUATION UNDER ANESTHESIA WITH HEMORRHOIDECTOMY N/A 03/21/2019   Procedure: OPEN HEMORRHOIDECTOMY;  Surgeon: Armandina Gemma, MD;  Location: [place];  Service: General;  Laterality: N/A;  . HEMORRHOID BANDING    . 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 TOTAL KNEE ARTHROPLASTY Right 03/06/2017   Procedure: RIGHT TOTAL KNEE ARTHROPLASTY;  Surgeon: Mcarthur Rossetti, MD;  Location: [place];  Service: Orthopedics;  Laterality: Right;  . TUBAL LIGATION      Family History  Problem Relation Age of Onset  . Diabetes Sister   . Asthma Sister   . Hyperlipidemia Sister   . Hypertension Sister   . Colon cancer Father 40  . Cancer Father   . Arthritis Mother   . Hypertension Mother   . Depression Sister   . Mental illness Sister   . Esophageal cancer Neg Hx   . Pancreatic cancer Neg Hx   . Stomach cancer Neg Hx     Social History   Socioeconomic History  . Marital status: Single    Spouse name: Not on file  . Number of children: 3  . Years of education: Not on file  . Highest education level: Not on file  Occupational History  . Not on file  Tobacco Use  . Smoking status: Former Research scientist (life sciences)  . Smokeless tobacco: Never Used  . 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 Vaping Use: Never used  Substance and Sexual Activity  . Alcohol use: No    Comment: seldom  . Drug use: No  . Sexual activity: Not on file  Other Topics  Concern  . Not on file  Social History Narrative  . Not on file   Social Determinants of Health   Financial Resource Strain: Low Risk   . Difficulty of Paying Living Expenses: Not hard at all  Food Insecurity: No Food Insecurity  . Worried About Charity fundraiser in the Last Year: Never true  . Ran Out of Food in the Last Year: Never true  Transportation Needs: No Transportation Needs  . Lack of Transportation (Medical): No  . Lack of Transportation (Non-Medical): No  Physical Activity: Inactive  . Days of Exercise per Week: 0 days  . Minutes of Exercise per Session: 0 min  Stress: No Stress Concern Present  . Feeling of Stress : Not at all  Social Connections: Moderately Integrated  . Frequency of Communication with Friends and Family: More than three times a week   . Frequency of Social Gatherings with Friends and Family: More than three times a week  . Attends Religious Services: More than 4 times per year  . Active Member of Clubs or Organizations: Yes  . Attends Archivist Meetings: More than 4 times per year  . Marital Status: Never married  Intimate Partner Violence: Not on file    Outpatient Medications Prior to Visit  Medication Sig Dispense Refill  . amLODipine-benazepril (LOTREL) 5-20 MG capsule Take 1 capsule by mouth once daily 90 capsule 0  . amoxicillin-clavulanate (AUGMENTIN) 875-125 MG tablet Take 1 tablet by mouth 2 (two) times daily. Take with food 20 tablet 0  . Ascorbic Acid (VITAMIN C) 1000 MG tablet Take 1,000 mg by mouth daily.    . cetirizine (ZYRTEC) 10 MG tablet Take 10 mg by mouth daily.    . cholecalciferol (VITAMIN D3) 25 MCG (1000 UNIT) tablet Take 1,000 Units by mouth daily. Unsure of dosage     . omega-3 fish oil (MAXEPA) 1000 MG CAPS capsule Take 1 mg by mouth.    . pantoprazole (PROTONIX) 40 MG tablet Take 1 tablet (40 mg total) by mouth 2 (two) times daily before a meal. About 30 minutes before breakfast and supper 180 tablet 3  . pravastatin (PRAVACHOL) 40 MG tablet Take 1 tablet (40 mg total) by mouth daily. 90 tablet 3  . aspirin EC 81 MG tablet Take 81 mg by mouth daily. (Patient not taking: Reported on 06/02/2020)    . AMBULATORY NON FORMULARY MEDICATION Medication Name: GI Cocktail 90 ml viscous lidocaine 90 ml dicyclomine 10 mg/ 5 ml 270 ml Maalox 400 mg SIG 30 ml PO prn stomach pain every 6 hours (Patient not taking: Reported on 06/02/2020) 450 mL 3  . cyclobenzaprine (FLEXERIL) 5 MG tablet Take 1 tablet (5 mg total) by mouth 3 (three) times daily as needed for muscle spasms. (Patient not taking: Reported on 06/02/2020) 30 tablet 0  . permethrin (ELIMITE) 5 % cream Apply to entire body from neck down and wash off in 12-14 hours. Repeat in 2 weeks. (Patient not taking: Reported on 06/02/2020) 60 g 1   No  facility-administered medications prior to visit.    Allergies  Allergen Reactions  . Ciprofloxacin Hives and Nausea Only  . Flagyl [Metronidazole] Hives and Nausea Only    ROS Review of Systems  Constitutional: Negative for chills, diaphoresis, fatigue, fever and unexpected weight change.  HENT: Negative.   Eyes: Negative for photophobia and visual disturbance.  Respiratory: Positive for cough, chest tightness and wheezing. Negative for shortness of breath.   Cardiovascular: Negative.  Gastrointestinal: Negative.   Genitourinary: Negative.   Musculoskeletal: Negative for arthralgias and myalgias.  Neurological: Negative for speech difficulty and weakness.  Psychiatric/Behavioral: Negative.       Objective:    Physical Exam Vitals and nursing note reviewed.  Constitutional:      Appearance: Normal appearance.  Eyes:     General:        Right eye: No discharge.        Left eye: No discharge.     Conjunctiva/sclera: Conjunctivae normal.  Pulmonary:     Effort: Pulmonary effort is normal.  Neurological:     Mental Status: She is alert and oriented to person, place, and time.  Psychiatric:        Mood and Affect: Mood normal.        Behavior: Behavior normal.     Temp 98.5 F (36.9 C) (Oral)   Ht 5' (1.524 m)   Wt 200 lb (90.7 kg)   BMI 39.06 kg/m  Wt Readings from Last 3 Encounters:  06/02/20 200 lb (90.7 kg)  04/02/20 203 lb 6 oz (92.3 kg)  10/17/19 198 lb 3.2 oz (89.9 kg)     Health Maintenance Due  Topic Date Due  . DEXA SCAN  Never done  . PNA vac Low Risk Adult (1 of 2 - PCV13) Never done    There are no preventive care reminders to display for this patient.  Lab Results  Component Value Date   TSH 1.45 06/04/2019   Lab Results  Component Value Date   WBC 6.1 06/04/2019   HGB 13.4 06/04/2019   HCT 41.6 06/04/2019   MCV 88.3 06/04/2019   PLT 241.0 06/04/2019   Lab Results  Component Value Date   NA 137 06/04/2019   K 4.4 06/04/2019    CO2 27 06/04/2019   GLUCOSE 95 06/04/2019   BUN 15 06/04/2019   CREATININE 0.99 06/04/2019   BILITOT 0.5 06/04/2019   ALKPHOS 69 06/04/2019   AST 14 06/04/2019   ALT 15 06/04/2019   PROT 7.3 06/04/2019   ALBUMIN 4.2 06/04/2019   CALCIUM 9.1 06/04/2019   ANIONGAP 14 03/07/2017   GFR 55.75 (L) 06/04/2019   Lab Results  Component Value Date   CHOL 199 06/04/2019   Lab Results  Component Value Date   HDL 55.10 06/04/2019   Lab Results  Component Value Date   LDLCALC 115 (H) 06/04/2019   Lab Results  Component Value Date   TRIG 144.0 06/04/2019   Lab Results  Component Value Date   CHOLHDL 4 06/04/2019   Lab Results  Component Value Date   HGBA1C 5.7 01/02/2018      Assessment & Plan:   Problem List Items Addressed This Visit      Respiratory   Lower respiratory infection - Primary   Relevant Medications   benzonatate (TESSALON) 200 MG capsule   Reactive airway disease   Relevant Medications   predniSONE (DELTASONE) 20 MG tablet   albuterol (VENTOLIN HFA) 108 (90 Base) MCG/ACT inhaler      Meds ordered this encounter  Medications  . predniSONE (DELTASONE) 20 MG tablet    Sig: Take 1 tablet (20 mg total) by mouth 2 (two) times daily with a meal for 7 days.    Dispense:  14 tablet    Refill:  0  . albuterol (VENTOLIN HFA) 108 (90 Base) MCG/ACT inhaler    Sig: Inhale 1 puff into the lungs every 6 (six) hours as needed for wheezing  or shortness of breath. And or cough    Dispense:  18 g    Refill:  2  . benzonatate (TESSALON) 200 MG capsule    Sig: Take 1 capsule (200 mg total) by mouth 2 (two) times daily as needed for cough.    Dispense:  20 capsule    Refill:  0    Follow-up: Return if symptoms worsen or fail to improve.  Will complete Z-Pak.  She will start prednisone.  Warned about increased appetite.  She will use Tessalon as needed.  She will use the inhaler as needed for tightness wheezing and cough.  Her granddaughter will instruct.  Follow-up  in 1 week if not improving.  [name], MD     Virtual Visit via Video Note  I connected with [facility] on 06/02/20 at  9:30 AM EDT by a video enabled telemedicine application and verified that I am speaking with the correct person using two identifiers.  Location: Patient: home with grand daughter. Provider: work   I discussed the limitations of evaluation and management by telemedicine and the availability of in person appointments. The patient expressed understanding and agreed to proceed.  History of Present Illness:    Observations/Objective:   Assessment and Plan:   Follow Up Instructions:    I discussed the assessment and treatment plan with the patient. The patient was provided an opportunity to ask questions and all were answered. The patient agreed with the plan and demonstrated an understanding of the instructions.   The patient was advised to call back or seek an in-person evaluation if the symptoms worsen or if the condition fails to improve as anticipated.  I provided 25 minutes of non-face-to-face time during this encounter.   [name], MD

## 2020-06-02 NOTE — Telephone Encounter (Signed)
Kayla from [place] calling about albuterol (VENTOLIN HFA) 108 (90 Base) MCG/ACT inhaler and said its not covered and wanted to know if it could be switched to pro air or pro ventel. Callback [phone]

## 2020-06-21 ENCOUNTER — Ambulatory Visit (INDEPENDENT_AMBULATORY_CARE_PROVIDER_SITE_OTHER): Payer: Medicare Other | Admitting: Family Medicine

## 2020-06-21 ENCOUNTER — Encounter: Payer: Self-pay | Admitting: Family Medicine

## 2020-06-21 ENCOUNTER — Other Ambulatory Visit: Payer: Self-pay

## 2020-06-21 VITALS — BP 110/70 | HR 86 | Temp 97.0°F | Ht 60.0 in | Wt 200.2 lb

## 2020-06-21 DIAGNOSIS — I10 Essential (primary) hypertension: Secondary | ICD-10-CM | POA: Diagnosis not present

## 2020-06-21 DIAGNOSIS — E7800 Pure hypercholesterolemia, unspecified: Secondary | ICD-10-CM | POA: Diagnosis not present

## 2020-06-21 DIAGNOSIS — Z0000 Encounter for general adult medical examination without abnormal findings: Secondary | ICD-10-CM

## 2020-06-21 NOTE — Progress Notes (Addendum)
Established Patient Office Visit  Subjective:  Patient ID: Julie Dougherty, female    DOB: 08-Jul-1950  Age: 70 y.o. MRN: [id number]  CC:  Chief Complaint  Patient presents with  . Annual Exam    CPE, no concerns fasting for labs.     HPI [name] presents for follow-up of hypertension and elevated cholesterol.  She is doing well on the higher dose of pravastatin.  No issues or myalgias.  She has taken it in the evening.  Blood pressure continues to be well controlled with Lotrel.  Up-to-date on health maintenance.  Has never had a DEXA scan.  She is not certain but thinks that her sister may have suffered an osteoporotic fracture.  Accompanied by her son today.  Past Medical History:  Diagnosis Date  . Arthritis   . Cataract   . Chronic constipation   . Diverticulitis   . Diverticulosis   . Fatty liver   . GERD (gastroesophageal reflux disease)   . Hemorrhoids, internal, with bleeding Gr 2 prolapsed 06/30/2011   Found on colonoscopy 2011 and anoscopy May 2013   . Hepatitis    "B"    no rx >10 yrs  . Hiatal hernia   . High cholesterol   . Hypertension   . Internal hemorrhoids   . OSA (obstructive sleep apnea)    does not use CPAP  . Pre-diabetes   . PUD (peptic ulcer disease)   . Rectal ulceration 04/27/2009   Associated reactive/regenerative changes and fibromuscular extensions into lamina propria/Mucosal Prolapse  . Sleep apnea    Waiting on CPAP machine    Past Surgical History:  Procedure Laterality Date  . ABDOMINAL HYSTERECTOMY    . CATARACT EXTRACTION Right 03/2018  . COLONOSCOPY  04/27/09    POLYPOID FRAGMENT OF COLONIC MUCOSA WITH SURFACE  . EVALUATION UNDER ANESTHESIA WITH HEMORRHOIDECTOMY N/A 03/21/2019   Procedure: OPEN HEMORRHOIDECTOMY;  Surgeon: Armandina Gemma, MD;  Location: [place];  Service: General;  Laterality: N/A;  . HEMORRHOID BANDING    . 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 TOTAL KNEE ARTHROPLASTY Right 03/06/2017   Procedure: RIGHT TOTAL KNEE ARTHROPLASTY;  Surgeon: Mcarthur Rossetti, MD;  Location: [place];  Service: Orthopedics;  Laterality: Right;  . TUBAL LIGATION      Family History  Problem Relation Age of Onset  . Diabetes Sister   . Asthma Sister   . Hyperlipidemia Sister   . Hypertension Sister   . Colon cancer Father 45  . Cancer Father   . Arthritis Mother   . Hypertension Mother   . Depression Sister   . Mental illness Sister   . Esophageal cancer Neg Hx   . Pancreatic cancer Neg Hx   . Stomach cancer Neg Hx     Social History   Socioeconomic History  . Marital status: Single    Spouse name: Not on file  . Number of children: 3  . Years of education: Not on file  . Highest education level: Not on file  Occupational History  . Not on file  Tobacco Use  . Smoking status: Former Research scientist (life sciences)  . Smokeless tobacco: Never Used  . 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 Vaping Use: Never used  Substance and Sexual Activity  . Alcohol use: No    Comment:  seldom  . Drug use: No  . Sexual activity: Not on file  Other Topics Concern  . Not on file  Social History Narrative  . Not on file   Social Determinants of Health   Financial Resource Strain: Low Risk   . Difficulty of Paying Living Expenses: Not hard at all  Food Insecurity: No Food Insecurity  . Worried About Charity fundraiser in the Last Year: Never true  . Ran Out of Food in the Last Year: Never true  Transportation Needs: No Transportation Needs  . Lack of Transportation (Medical): No  . Lack of Transportation (Non-Medical): No  Physical Activity: Inactive  . Days of Exercise per Week: 0 days  . Minutes of Exercise per Session: 0 min  Stress: No Stress Concern Present  . Feeling of Stress : Not at all  Social Connections:  Moderately Integrated  . Frequency of Communication with Friends and Family: More than three times a week  . Frequency of Social Gatherings with Friends and Family: More than three times a week  . Attends Religious Services: More than 4 times per year  . Active Member of Clubs or Organizations: Yes  . Attends Archivist Meetings: More than 4 times per year  . Marital Status: Never married  Intimate Partner Violence: Not on file    Outpatient Medications Prior to Visit  Medication Sig Dispense Refill  . albuterol (VENTOLIN HFA) 108 (90 Base) MCG/ACT inhaler Inhale 1 puff into the lungs every 6 (six) hours as needed for wheezing or shortness of breath. And or cough 18 g 0  . amLODipine-benazepril (LOTREL) 5-20 MG capsule Take 1 capsule by mouth once daily 90 capsule 0  . Ascorbic Acid (VITAMIN C) 1000 MG tablet Take 1,000 mg by mouth daily.    . benzonatate (TESSALON) 200 MG capsule Take 1 capsule (200 mg total) by mouth 2 (two) times daily as needed for cough. 20 capsule 0  . cetirizine (ZYRTEC) 10 MG tablet Take 10 mg by mouth daily.    . cholecalciferol (VITAMIN D3) 25 MCG (1000 UNIT) tablet Take 1,000 Units by mouth daily. Unsure of dosage     . omega-3 fish oil (MAXEPA) 1000 MG CAPS capsule Take 1 mg by mouth.    . pantoprazole (PROTONIX) 40 MG tablet Take 1 tablet (40 mg total) by mouth 2 (two) times daily before a meal. About 30 minutes before breakfast and supper 180 tablet 3  . pravastatin (PRAVACHOL) 40 MG tablet Take 1 tablet (40 mg total) by mouth daily. 90 tablet 3  . amoxicillin-clavulanate (AUGMENTIN) 875-125 MG tablet Take 1 tablet by mouth 2 (two) times daily. Take with food 20 tablet 0  . aspirin EC 81 MG tablet Take 81 mg by mouth daily. (Patient not taking: Reported on 06/02/2020)     No facility-administered medications prior to visit.    Allergies  Allergen Reactions  . Ciprofloxacin Hives and Nausea Only  . Flagyl [Metronidazole] Hives and Nausea Only     ROS Review of Systems  Constitutional: Negative for diaphoresis, fatigue, fever and unexpected weight change.  HENT: Negative.   Eyes: Negative for photophobia and visual disturbance.  Respiratory: Negative.   Cardiovascular: Negative.   Gastrointestinal: Negative.   Endocrine: Negative for polyphagia.  Genitourinary: Negative.   Musculoskeletal: Negative for gait problem and joint swelling.  Allergic/Immunologic: Negative for immunocompromised state.  Neurological: Negative.   Hematological: Does not bruise/bleed easily.  Psychiatric/Behavioral: Negative.      Depression screen  [facility] 2/9 06/21/2020 09/18/2019 06/04/2019  Decreased Interest 0 0 0  Down, Depressed, Hopeless 0 0 0  PHQ - 2 Score 0 0 0  Altered sleeping - - 0  Tired, decreased energy - - 1  Change in appetite - - 0  Feeling bad or failure about yourself  - - 0  Trouble concentrating - - 0  Moving slowly or fidgety/restless - - 0  Suicidal thoughts - - 0  PHQ-9 Score - - 1  Difficult doing work/chores - - Not difficult at all      Objective:    Physical Exam Vitals and nursing note reviewed.  Constitutional:      General: She is not in acute distress.    Appearance: Normal appearance. She is not ill-appearing, toxic-appearing or diaphoretic.  HENT:     Head: Normocephalic and atraumatic.     Right Ear: Tympanic membrane, ear canal and external ear normal.     Left Ear: Tympanic membrane, ear canal and external ear normal.     Mouth/Throat:     Mouth: Mucous membranes are moist.     Pharynx: Oropharynx is clear. No oropharyngeal exudate or posterior oropharyngeal erythema.  Eyes:     General: No scleral icterus.       Right eye: No discharge.        Left eye: No discharge.     Conjunctiva/sclera: Conjunctivae normal.     Pupils: Pupils are equal, round, and reactive to light.  Cardiovascular:     Rate and Rhythm: Normal rate and regular rhythm.  Pulmonary:     Effort: Pulmonary effort is normal.   Abdominal:     General: Bowel sounds are normal.  Musculoskeletal:     Cervical back: No rigidity or tenderness.  Lymphadenopathy:     Cervical: No cervical adenopathy.  Skin:    General: Skin is warm and dry.  Neurological:     Mental Status: She is alert and oriented to person, place, and time.  Psychiatric:        Mood and Affect: Mood normal.        Behavior: Behavior normal.     BP 110/70   Pulse 86   Temp (!) 97 F (36.1 C) (Temporal)   Ht 5' (1.524 m)   Wt 200 lb 3.2 oz (90.8 kg)   SpO2 96%   BMI 39.10 kg/m  Wt Readings from Last 3 Encounters:  06/21/20 200 lb 3.2 oz (90.8 kg)  06/02/20 200 lb (90.7 kg)  04/02/20 203 lb 6 oz (92.3 kg)     Health Maintenance Due  Topic Date Due  . DEXA SCAN  Never done  . PNA vac Low Risk Adult (1 of 2 - PCV13) Never done    There are no preventive care reminders to display for this patient.  Lab Results  Component Value Date   TSH 1.45 06/04/2019   Lab Results  Component Value Date   WBC 6.1 06/04/2019   HGB 13.4 06/04/2019   HCT 41.6 06/04/2019   MCV 88.3 06/04/2019   PLT 241.0 06/04/2019   Lab Results  Component Value Date   NA 137 06/04/2019   K 4.4 06/04/2019   CO2 27 06/04/2019   GLUCOSE 95 06/04/2019   BUN 15 06/04/2019   CREATININE 0.99 06/04/2019   BILITOT 0.5 06/04/2019   ALKPHOS 69 06/04/2019   AST 14 06/04/2019   ALT 15 06/04/2019   PROT 7.3 06/04/2019   ALBUMIN 4.2 06/04/2019   CALCIUM  9.1 06/04/2019   ANIONGAP 14 03/07/2017   GFR 55.75 (L) 06/04/2019   Lab Results  Component Value Date   CHOL 199 06/04/2019   Lab Results  Component Value Date   HDL 55.10 06/04/2019   Lab Results  Component Value Date   LDLCALC 115 (H) 06/04/2019   Lab Results  Component Value Date   TRIG 144.0 06/04/2019   Lab Results  Component Value Date   CHOLHDL 4 06/04/2019   Lab Results  Component Value Date   HGBA1C 5.7 01/02/2018      Assessment & Plan:   Problem List Items Addressed This  Visit      Cardiovascular and Mediastinum   Essential hypertension - Primary   Relevant Orders   CBC   Comprehensive metabolic panel   Urinalysis, Routine w reflex microscopic     Other   Elevated cholesterol   Relevant Orders   Comprehensive metabolic panel   Lipid panel   Healthcare maintenance   Relevant Orders   DG Bone Density      No orders of the defined types were placed in this encounter.   Follow-up: Return in about 6 months (around 12/22/2020).   Hopefully LDL will be lower this visit.  She is fasting today.  Blood pressure looks great.  Agrees to go for DEXA scan.  Given information in Spanish about health maintenance and disease prevention for those over 69.  Also given information on hypertension and preventing high cholesterol. [name], MD

## 2020-06-21 NOTE — Addendum Note (Signed)
Addended by: Renette Butters on: 06/21/2020 03:13 PM   Modules accepted: Orders

## 2020-06-22 ENCOUNTER — Encounter: Payer: Self-pay | Admitting: Family Medicine

## 2020-06-23 NOTE — Addendum Note (Signed)
Addended by: Jon Billings on: 06/23/2020 07:56 AM   Modules accepted: Level of Service

## 2020-06-23 NOTE — Patient Instructions (Signed)
Mantenimiento de la salud despus de los 22 aos de edad Health Maintenance After Age 69 Despus de los 65 aos de edad, corre un riesgo mayor de Tourist information centre manager enfermedades e infecciones a Barrister's clerk, como tambin de sufrir lesiones por cadas. Las cadas son la causa principal de las fracturas de huesos y lesiones en la cabeza de personas mayores de 83 aos de edad. Recibir cuidados preventivos de forma regular puede ayudarlo a mantenerse saludable y en buen [place]. Los cuidados preventivos incluyen realizarse anlisis de forma regular y Actor en el estilo de vida segn las recomendaciones del mdico. Converse con el profesional que lo asiste sobre:  Las pruebas de deteccin y los anlisis que debe Dispensing optician. Una prueba de deteccin es un estudio que se para Hydrographic surveyor la presencia de una enfermedad cuando no tiene sntomas.  Un plan de dieta y ejercicios adecuado para usted. Qu debo saber sobre las pruebas de deteccin y los anlisis para prevenir cadas? Realizarse pruebas de deteccin y [employer] es la mejor manera de Hydrographic surveyor un problema de salud de forma temprana. El diagnstico y tratamiento tempranos le brindan la mejor oportunidad de Chief Technology Officer las afecciones mdicas que son comunes despus de los 77 aos de edad. Ciertas afecciones y elecciones de estilo de vida pueden hacer que sea ms propenso a sufrir Engineer, manufacturing. El mdico puede recomendarle lo siguiente:  Controles regulares de la visin. Una visin deficiente y afecciones como las cataratas pueden hacer que sea ms propenso a sufrir Engineer, manufacturing. Si [place] lentes, asegrese de obtener una receta actualizada si su visin cambia.  Revisin de medicamentos. Revise regularmente con el mdico todos los medicamentos que toma, incluidos los medicamentos de [place]. Consulte al [employer] efectos secundarios que pueden hacer que sea ms propenso a sufrir Engineer, manufacturing. Informe al mdico si alguno de los medicamentos que toma lo hace  sentir mareado o somnoliento.  Pruebas de deteccin para la osteoporosis. La osteoporosis es una afeccin que hace que los huesos se vuelvan ms frgiles. En consecuencia, los huesos pueden debilitarse y quebrarse ms fcilmente.  Pruebas de deteccin para la presin arterial. Los cambios en la presin arterial y los medicamentos para Chief Technology Officer la presin arterial pueden hacerlo sentir mareado.  Controles de fuerza y equilibrio. El mdico puede recomendar ciertos estudios para controlar su fuerza y equilibrio al estar de pie, al caminar o al cambiar de posicin.  Examen de los pies. El dolor y Chiropractor en los pies, como tambin no utilizar el calzado [place], pueden hacer que sea ms propenso a sufrir Engineer, manufacturing.  Prueba de deteccin de la depresin. Es ms probable que sufra una cada si tiene miedo a caerse, se siente mal emocionalmente o se siente incapaz de Patent examiner.  Prueba de deteccin de consumo de alcohol. Beber demasiado alcohol puede afectar su equilibrio y puede hacer que sea ms propenso a sufrir Engineer, manufacturing. Qu medidas puedo tomar para reducir mi riesgo de sufrir una cada? Instrucciones generales  Hable con el mdico sobre sus riesgos de sufrir una cada. Infrmele a su mdico si: ? Se cae. Asegrese de informarle a su mdico acerca de todas las cadas, incluso aquellas que parecen ser [employer]. ? Se siente mareado, somnoliento o que pierde el equilibrio.  Tome los medicamentos de venta libre y los recetados solamente como se lo haya indicado el mdico. Estos incluyen todos los suplementos.  Siga una dieta sana y [place] un peso saludable. Una dieta saludable incluye  productos lcteos descremados, carnes bajas en contenido de grasa ([place], fibra de granos enteros, frijoles y [place] frutas y verduras. La seguridad en el hogar  Retire los objetos que puedan causar tropiezos tales como alfombras, cables u obstculos.  Instale equipos de  seguridad, como barras para sostn en los baos y barandas de seguridad en las escaleras.  [place] habitaciones y los pasillos bien iluminados. Actividad  Siga un programa de ejercicio regular para mantenerse en forma. Esto lo ayudar a Contractor equilibrio. Consulte al mdico qu tipos de ejercicios son adecuados para usted.  Si necesita un bastn o un andador, selo segn las recomendaciones del mdico.  Utilice calzado con buen apoyo y suela antideslizante.   Estilo de vida  No beba alcohol si el mdico le indica que no beba.  Si bebe alcohol, limite la cantidad que consume: ? De 0 a 1 medida por da para las mujeres. ? De 0 a 2 medidas por da para los hombres.  Est atento a la cantidad de alcohol que contiene su bebida. En los [place] una medida equivale a una botella tpica de cerveza (12 onzas), media copa de vino (5 onzas) o una medida de bebida blanca (1 onza).  No consuma ningn producto que contenga nicotina o tabaco, como cigarrillos y Psychologist, sport and exercise. Si necesita ayuda para dejar de fumar, consulte al mdico. Resumen  Tener un estilo de vida saludable y recibir cuidados preventivos pueden ayudar a Theatre stage manager salud y el bienestar despus de los 7 aos de [place].  Realizarse pruebas de deteccin y [employer] es la mejor manera de Hydrographic surveyor un problema de salud de forma temprana y Lourena Simmonds a Product/process development scientist una cada. El diagnstico y tratamiento tempranos le brindan la mejor oportunidad de Chief Technology Officer las afecciones mdicas ms comunes en las personas mayores de 57 aos de edad.  Las cadas son la causa principal de las fracturas de huesos y lesiones en la cabeza de personas mayores de 25 aos de edad. Tome precauciones para evitar una cada en su casa.  Trabaje con el mdico para saber qu cambios que puede hacer para mejorar su salud y [place], y [place]. Esta informacin no tiene Marine scientist el consejo del mdico. Asegrese de hacerle al  mdico cualquier pregunta que tenga. Document Revised: 03/01/2017 Document Reviewed: 03/01/2017 Elsevier Patient Education  2021 [place].  Hipertensin en los adultos Hypertension, Adult La presin arterial alta (hipertensin) se produce cuando la fuerza de la sangre bombea a travs de las arterias con mucha fuerza. Las arterias son los vasos sanguneos que transportan la sangre desde el corazn al resto del cuerpo. La hipertensin hace que el corazn haga ms esfuerzo para Chiropodist y [employer] que las arterias se Teacher, music o Advertising account executive. La hipertensin no tratada o no controlada puede causar infarto de miocardio, insuficiencia cardaca, accidente cerebrovascular, enfermedad renal y otros problemas. Una lectura de la presin arterial consta de un nmero ms alto sobre un nmero ms bajo. En condiciones ideales, la presin arterial debe estar por debajo de 120/80. El primer nmero ("superior") es la presin sistlica. Es la medida de la presin de las arterias cuando el corazn late. El segundo nmero ("inferior") es la presin diastlica. Es la medida de la presin en las arterias cuando el corazn se relaja. Cules son las causas? Se desconoce la causa exacta de esta afeccin. Hay algunas afecciones que causan presin arterial alta o estn relacionadas con ella. Qu incrementa el riesgo? Algunos factores de [employer]  de hipertensin estn bajo su control. Los siguientes factores pueden hacer que sea ms propenso a Armed forces training and education officer afeccin:  Fumar.  Tener diabetes mellitus tipo 2, colesterol alto, o ambos.  No hacer la cantidad suficiente de actividad fsica o ejercicio.  Tener sobrepeso.  Consumir mucha grasa, azcar, caloras o sal (sodio) en su dieta.  Beber alcohol en exceso. Algunos factores de riesgo para la presin arterial alta pueden ser difciles o imposibles de [place] manager. Algunos de estos factores son los siguientes:  Tener enfermedad renal crnica.  Tener  antecedentes familiares de presin arterial alta.  Edad. Los riesgos aumentan con la edad.  [place]. El riesgo es mayor para las Retail banker.  Sexo. Antes de los 45aos, los hombres corren ms [employer]. Despus de los 65aos, las mujeres corren ms [employer].  Tener apnea obstructiva del sueo.  Estrs. Cules son los signos o los sntomas? Es posible que la presin arterial alta puede no cause sntomas. La presin arterial muy alta (crisis hipertensiva) puede provocar:  Dolor de cabeza.  Ansiedad.  Falta de aire.  Hemorragia nasal.  Nuseas y vmitos.  Cambios en la visin.  Dolor de pecho intenso.  Convulsiones. Cmo se diagnostica? Esta afeccin se diagnostica al medir su presin arterial mientras se encuentra sentado, con el brazo apoyado sobre una superficie plana, las piernas sin cruzar y los pies bien apoyados en el piso. El brazalete del tensimetro debe colocarse directamente sobre la piel de la parte superior del brazo y al nivel de su corazn. Debe medirla al [facility] veces en el mismo brazo. Determinadas condiciones pueden causar una diferencia de presin arterial entre el brazo izquierdo y Insurance underwriter. Ciertos factores pueden provocar que las lecturas de la presin arterial sean inferiores o superiores a lo normal por un perodo corto de tiempo:  Si su presin arterial es ms alta cuando se encuentra en el consultorio del mdico que cuando la mide en su hogar, se denomina "hipertensin de bata blanca". La [employer] de las personas que tienen esta afeccin no deben ser [employer].  Si su presin arterial es ms alta en el hogar que cuando se encuentra en el consultorio del mdico, se denomina "hipertensin enmascarada". La [employer] de las personas que tienen esta afeccin deben ser medicadas para Chief Technology Officer la presin arterial. Si tiene una lecturas de presin arterial alta durante una visita o si tiene presin arterial normal con  otros factores de riesgo, se le podr pedir que haga lo siguiente:  Que regrese otro da para volver a Chief Technology Officer su presin arterial nuevamente.  Que se controle la presin arterial en su casa durante 1 semana o ms. Si se le diagnostica hipertensin, es posible que se le realicen otros anlisis de sangre o estudios de diagnstico por imgenes para ayudar a su mdico a comprender su riesgo general de tener otras afecciones. Cmo se trata? Esta afeccin se trata haciendo cambios saludables en el estilo de vida, tales como ingerir alimentos saludables, realizar ms ejercicio y reducir el consumo de alcohol. El mdico puede recetarle medicamentos si los cambios en el estilo de vida no son suficientes para Child psychotherapist la presin arterial y si:  Su presin arterial sistlica est por encima de 130.  Su presin arterial diastlica est por encima de 80. La presin arterial deseada puede variar en funcin de las enfermedades, la edad y otros factores personales. Siga estas instrucciones en su casa: Comida y bebida  Siga una dieta con alto contenido de fibras  y [place], y con [place], Location manager agregada y grasas. Un ejemplo de plan alimenticio es la dieta DASH (Dietary Approaches to Stop Hypertension, Mtodos alimenticios para detener la hipertensin). Para alimentarse de esta manera: ? Coma mucha fruta y [place]. Trate de que la mitad del plato de cada comida sea de frutas y verduras. ? Coma cereales integrales, como pasta integral, arroz integral o pan integral. Llene aproximadamente un cuarto del plato con cereales integrales. ? Coma y beba productos lcteos con bajo contenido de grasa, como leche descremada o yogur bajo en grasas. ? Evite la ingesta de cortes de carne grasa, carne procesada o curada, y carne de ave con piel. Llene aproximadamente un cuarto del plato con protenas magras, como pescado, pollo sin piel, frijoles, huevos o tofu. ? Evite ingerir alimentos  prehechos y procesados. En general, estos tienen mayor cantidad de sodio, azcar agregada y Wendee Copp.  Reduzca su ingesta diaria de sodio. La mayora de las personas que tienen hipertensin deben comer menos de 1500 mg de sodio por [employer].  No beba alcohol si: ? Su mdico le indica no hacerlo. ? Est embarazada, puede estar embarazada o est tratando de quedar embarazada.  Si bebe alcohol: ? Limite la cantidad que bebe a lo siguiente:  De 0 a 1 medida por da para las mujeres.  De 0 a 2 medidas por da para los hombres. ? Est atento a la cantidad de alcohol que hay en las bebidas que toma. En los [place], una medida equivale a una botella de cerveza de 12oz (33ml), un vaso de vino de 5oz (134ml) o un vaso de una bebida alcohlica de alta graduacin de 1oz (63ml).   Estilo de vida  Trabaje con su mdico para mantener un peso saludable o Administrator, Civil Service. Pregntele cul es el peso recomendado para usted.  Haga al menos 46minutos de ejercicio la [employer] de la [place]. Estas actividades pueden incluir caminar, nadar o andar en bicicleta.  Incluya ejercicios para fortalecer sus msculos (ejercicios de resistencia), como [place] o levantamiento de pesas, como parte de su rutina semanal de ejercicios. Intente realizar 67minutos de este tipo de ejercicios al [employer] a la [place].  No consuma ningn producto que contenga nicotina o tabaco, como cigarrillos, cigarrillos electrnicos y tabaco de Higher education careers adviser. Si necesita ayuda para dejar de fumar, consulte al mdico.  Contrlese la presin arterial en su casa segn las indicaciones del mdico.  Concurra a todas las visitas de seguimiento como se lo haya indicado el mdico. Esto es importante.   Medicamentos  [employer] de venta libre y los recetados solamente como se lo haya indicado el mdico. Siga cuidadosamente las indicaciones. Los medicamentos para la presin arterial deben tomarse segn las indicaciones.  No  omita las dosis de medicamentos para la presin arterial. Si lo hace, estar en riesgo de tener problemas y puede hacer que los medicamentos sean menos eficaces.  Pregntele a su mdico a qu efectos secundarios o reacciones a los Careers information officer. Comunquese con un mdico si:  Piensa que tiene una reaccin a un medicamento que est tomando.  Tiene dolores de cabeza frecuentes (recurrentes).  Se siente mareado.  Tiene hinchazn en los tobillos.  Tiene problemas de visin. Solicite ayuda inmediatamente si:  Siente un dolor de cabeza intenso o confusin.  Siente debilidad inusual o adormecimiento.  Siente que va a desmayarse.  Siente un dolor intenso en el pecho o el abdomen.  Vomita repetidas  veces.  Tiene dificultad para respirar. Resumen  La hipertensin se produce cuando la sangre bombea en las arterias con mucha fuerza. Si esta afeccin no se controla, podra correr riesgo de tener complicaciones graves.  La presin arterial deseada puede variar en funcin de las enfermedades, la edad y otros factores personales. Para la [employer], una presin arterial normal es menor que 120/80.  La hipertensin se trata con cambios en el estilo de vida, medicamentos o una combinacin de [place]. Los McDonald[employer] Corporation estilo de vida incluyen prdida de peso, ingerir alimentos sanos, seguir una dieta baja en sodio, hacer ms ejercicio y Environmental consultant consumo de alcohol. Esta informacin no tiene Marine scientist el consejo del mdico. Asegrese de hacerle al mdico cualquier pregunta que tenga. Document Revised: 11/01/2017 Document Reviewed: 11/01/2017 Elsevier Patient Education  2021 [place] preventivos en las mujeres a partir de los 46 aos de edad Preventive Care 62 Years and Older, Female Los cuidados preventivos hacen referencia a las opciones en cuanto al estilo de vida y a las visitas al mdico, las cuales pueden promover la salud y Manufacturing systems engineer. Esto puede comprender lo siguiente:  Un examen fsico anual. Esto tambin se conoce como visita de control de bienestar anual.  Exmenes dentales y oculares de [place] regular.  Vacunas.  Estudios para Engineer, building services.  Elecciones para un estilo de vida saludable, por ejemplo: ? Seguir una dieta saludable. ? Practicar actividad fsica con regularidad. ? No consumir drogas ni productos que contengan nicotina y tabaco. ? Limitar el consumo de bebidas alcohlicas. Qu puedo esperar para mi visita de cuidado preventivo? Examen fsico El mdico revisar lo siguiente:  IT consultant y [place]. Estos pueden usarse para calcular el IMC (ndice de masa corporal). El [facility] es una medicin que indica si tiene un peso saludable.  Frecuencia cardaca y presin arterial.  Temperatura corporal.  Piel para detectar manchas anormales. Asesoramiento Su mdico puede preguntarle acerca de:  Problemas mdicos pasados.  Antecedentes mdicos familiares.  Consumo de tabaco, alcohol y drogas.  Su bienestar emocional.  Restaurant manager, fast food y las relaciones personales.  Su actividad sexual.  Hbitos de alimentacin, ejercicio y sueo.  Sus antecedentes de cadas.  Memoria y capacidad de comprensin (facultades cognitivas).  Su trabajo y [place] y antecedentes menstruales.  Acceso a armas de fuego. Qu vacunas necesito? Las vacunas se aplican a varias edades, segn un calendario. El [employer]om recomendar vacunas segn su edad, sus antecedentes mdicos, su estilo de vida y otros factores, como los viajes o el lugar donde trabaja.   Qu pruebas necesito? Anlisis de [employer] de lpidos y colesterol. Estos pueden controlarse cada 5 aos o ms a menudo segn su salud general.  Anlisis de hepatitis C.  Anlisis de hepatitis B. Pruebas de deteccin  Pruebas de deteccin de cncer de pulmn. Es posible que se le realice esta prueba de  deteccin a partir de los 3 aos de edad, si ha fumado durante 30 aos un paquete diario y sigue fumando o dej el hbito en algn momento en los ltimos 15 aos.  Pruebas de Programme researcher, broadcasting/film/video de Surveyor, minerals. ? Todos los adultos a Proofreader de los 11 aos de edad y [place] 33 aos de edad deben hacerse esta prueba de deteccin. ? El mdico puede recomendarle las pruebas de deteccin a partir de los 44 aos de edad si corre un mayor riesgo. ? Le realizarn pruebas cada 1 a  [place], segn los [place] y el tipo de prueba de Programme researcher, broadcasting/film/video.  Pruebas de deteccin de la diabetes. ? Esto se Set designer un control del azcar en la sangre (glucosa) despus de no haber comido durante un periodo de tiempo (ayuno). ? Es posible que se le realice esta prueba cada 1 a 3 aos.  Mamografa. ? Se puede realizar cada 1 o 2 aos. ? Hable con el mdico sobre la frecuencia con la que debe realizarse [place].  Estudio de deteccin de aneurisma artico abdominal (AAA). Es posible que necesite este estudio si es fumador o lo fue en el pasado.  Pruebas de deteccin de cncer relacionado con las mutaciones del BRCA. Es posible que se las deba realizar si tiene antecedentes de cncer de mama, de ovario, de trompas o peritoneal. Otras pruebas  Pruebas de enfermedades de transmisin sexual (ETS), si est en riesgo.  Densitometra sea. Esto se realiza para detectar osteoporosis. Es posible que se le realice esta prueba a partir de los 8 aos de [place]. Hable con su mdico [employer], las opciones de tratamiento y, si corresponde, la necesidad de Optometrist ms pruebas. Siga estas instrucciones en su casa: Comida y bebida  Siga una dieta que incluya frutas y verduras frescas, cereales integrales, protenas magras y productos lcteos descremados. Limite el consumo de alimentos con alto contenido de Location manager, grasas saturadas y sal.  Tome los suplementos vitamnicos y minerales como se lo haya  indicado el mdico.  No beba alcohol si el mdico se lo prohbe.  Si bebe alcohol: ? Limite la cantidad que consume de 0 a 1 medida por da. ? Est atenta a la cantidad de alcohol que hay en las bebidas que toma. En los [place], una medida equivale a una botella de cerveza de 12 oz (355 ml), un vaso de vino de 5 oz (148 ml) o un vaso de una bebida alcohlica de alta graduacin de 1 oz (44 ml).   Estilo de [employer] International Corporation y las encas a diario. Cepllese los dientes a la maana y a la noche con pasta dental con fluoruro. Use hilo dental una vez al da.  Mantngase activa. Haga al menos 30 minutos de ejercicio, 5 o ms das cada semana.  No consuma ningn producto que contenga nicotina o tabaco, como cigarrillos, cigarrillos electrnicos y tabaco de Higher education careers adviser. Si necesita ayuda para dejar de fumar, consulte al mdico.  No consuma drogas.  Si es sexualmente activa, practique sexo seguro. Use un condn u otra forma de proteccin para prevenir las ITS (infecciones de transmisin sexual).  Hable con el mdico acerca de tomar una dosis baja de aspirina o estatina.  Encuentre formas saludables de lidiar con el estrs tales como: ? Meditacin, yoga o escuchar msica. ? Lleve un diario personal. ? Hable con una persona confiable. ? Pase tiempo con amigos y familiares. Seguridad  [place] siempre el cinturn de seguridad al conducir o viajar en un vehculo.  No conduzca: ? Si ha estado bebiendo alcohol. No viaje con un conductor que ha estado bebiendo. ? Si est cansada o distrada. ? Mientras est enviando mensajes de texto.  Use un casco y otros equipos de proteccin [place] deportivas.  Si tiene armas de fuego en su casa, asegrese de seguir todos los procedimientos de seguridad correspondientes. Cundo volver?  Visite al mdico una vez al ao para una visita anual de control de bienestar.  Pregntele al mdico con qu frecuencia debe  realizarse un control  de la vista y los dientes.  Mantenga su esquema de vacunacin al da. Esta informacin no tiene Marine scientist el consejo del mdico. Asegrese de hacerle al mdico cualquier pregunta que tenga. Document Revised: 01/07/2020 Document Reviewed: 01/01/2019 Elsevier Patient Education  2021 [place] del colesterol alto Preventing High Cholesterol El colesterol es una sustancia blanca y [place], similar a la grasa, que el cuerpo humano necesita para ayudar a Librarian, academic. El hgado fabrica todo el colesterol que el cuerpo de una persona necesita. Tener el colesterol alto (hipercolesterolemia) aumenta el riesgo de sufrir enfermedades cardacas y accidentes cerebrovasculares. El exceso de colesterol o el colesterol adicional proviene de los alimentos que comemos. El colesterol alto con frecuencia puede prevenirse con cambios en la dieta y en el estilo de vida. Si ya tiene Orthoptist, puede controlarlo haciendo cambios en la dieta y en el estilo de vida, y con medicamentos. Cmo me puede afectar el colesterol alto? Si tiene Orthoptist, se pueden acumular depsitos de esta sustancia (placas) en las paredes de los vasos sanguneos. Los vasos sanguneos que transportan la sangre desde el corazn al resto del cuerpo se denominan arterias. Las Occupational hygienist y la rigidez [place] arterias. Esto a su vez puede:  Limitar u obstruir el flujo sanguneo y causar la formacin de cogulos de [place].  Aumentar el riesgo de sufrir un infarto de miocardio y un accidente cerebrovascular. Qu factores pueden aumentar mi riesgo de colesterol alto? Es ms probable que Orthoptist en personas que:  Consumen alimentos con alto contenido de grasa saturada o colesterol. Las grasas saturadas se encuentran principalmente en los alimentos de origen animal.  Tienen sobrepeso.  No hacen suficiente ejercicio fsico.  Tienen antecedentes familiares de  colesterol alto (hipercolesterolemia familiar). Qu medidas de prevencin puedo tomar? Nutricin  Coma menos grasas saturadas.  Evite las grasas trans (aceites parcialmente hidrogenados). Estas suelen encontrarse en la margarina y en algunos productos horneados, alimentos fritos y refrigerios comprados en paquete.  Evite la carne precocinada o curada, como tocino, salchichas o panes de carne.  Evite alimentos y bebidas que tengan azcares agregados.  Consuma ms frutas, verduras y cereales integrales.  Elija fuentes saludables de protenas, como pescado, carne de ave, cortes magros de carnes rojas, frijoles, guisantes, lentejas y nueces.  Elija fuentes saludables de grasas, por ejemplo: ? Frutos secos. ? Aceites vegetales, en particular el aceite de oliva. ? Pescados que contengan grasas saludables, como los cidos grasos omega3. Estos pescados incluyen la caballa y el salmn.   Estilo de vida  Baje de peso si es necesario. Mantener un ndice de masa corporal [facility]) saludable puede ayudar a prevenir o Artist. Tambin puede disminuir el riesgo de diabetes y de presin arterial alta. Pdale al mdico que lo ayude a disear un plan de dieta y ejercicio para bajar de peso de [place] segura.  No consuma ningn producto que contenga nicotina o tabaco, como cigarrillos, cigarrillos electrnicos y tabaco de Higher education careers adviser. Si necesita ayuda para dejar de consumir estos productos, consulte al [employer]. Consumo de alcohol  No beba alcohol si: ? Su mdico le indica no hacerlo. ? Est embarazada, puede estar embarazada o est planeando quedar embarazada.  Si bebe alcohol: ? Limite la cantidad que bebe:  De 0 a 1 medida por da para las mujeres.  De 0 a 2 medidas por da para los hombres. ? Est atento a la cantidad de alcohol que  hay en las bebidas que toma. En los [place], una medida equivale a una botella de cerveza de 12oz (343ml), un vaso de vino de 5oz (181ml) o  un vaso de una bebida alcohlica de alta graduacin de 1oz (31ml). Actividad  Ejerctese lo suficiente. Haga ejercicio como se lo haya indicado el mdico.  Cada semana, haga al menos 150 minutos de ejercicio que requiera un nivel medio de esfuerzo (ejercicio de intensidad [place]). Este tipo de ejercicio: ? Hace que el corazn lata ms rpido, pero hace posible que pueda hablar mientras lo realiza. ? Puede hacerse en sesiones cortas varias veces al da o en sesiones ms largas varias veces por semana. Por ejemplo, en 5 das por semana, podra caminar rpido o andar en bicicleta 3 veces por da durante 10 minutos cada vez.   Medicamentos  El mdico puede recomendarle medicamentos para ayudar a Musician. Este puede ser un medicamento para reducir la cantidad de colesterol que produce el hgado. Es posible que necesite tomar medicamentos si: ? Los [employer] dieta y el estilo de vida no han reducido lo suficiente el colesterol. ? Tiene colesterol alto y presenta otros factores de riesgo de sufrir enfermedades cardacas o accidentes cerebrovasculares.  Tome los medicamentos de venta libre y los recetados solamente como se lo haya indicado el mdico. Informacin general  Controle los factores de riesgo del colesterol alto. Hable con el mdico acerca de todos los factores de riesgo y cmo reducir Catering manager.  Controle otras afecciones que pueda tener, por ejemplo diabetes o presin arterial alta (hipertensin).  Hgase anlisis de sangre para [employer] niveles de colesterol de [place] peridica, segn las indicaciones del mdico.  Consulting civil engineer a todas las visitas de seguimiento como se lo haya indicado el mdico. Esto es importante. Dnde buscar ms informacin  American Heart Association (Asociacin Estadounidense del [place]): www.heart.org  National Heart, Lung, and Blood Institute ([place], los Pulmones y Herbalist):  https://wilson-eaton.com/ Resumen  El colesterol alto aumenta el riesgo de sufrir enfermedades cardacas y accidentes cerebrovasculares. Si mantiene el colesterol bajo, puede reducir el riesgo de tener estas afecciones.  El colesterol alto con frecuencia puede prevenirse con cambios en la dieta y en el estilo de vida.  Consulte al mdico para controlar los factores de riesgo y hgase anlisis de sangre con regularidad. Esta informacin no tiene Marine scientist el consejo del mdico. Asegrese de hacerle al mdico cualquier pregunta que tenga. Document Revised: 01/28/2019 Document Reviewed: 01/28/2019 Elsevier Patient Education  2021 [employer].

## 2020-06-24 ENCOUNTER — Ambulatory Visit (HOSPITAL_BASED_OUTPATIENT_CLINIC_OR_DEPARTMENT_OTHER)
Admission: RE | Admit: 2020-06-24 | Discharge: 2020-06-24 | Disposition: A | Discharge status: Home / Self Care | Payer: Medicare Other | Source: Ambulatory Visit | Attending: Family Medicine | Admitting: Family Medicine

## 2020-06-24 ENCOUNTER — Other Ambulatory Visit: Payer: Self-pay

## 2020-06-24 DIAGNOSIS — Z780 Asymptomatic menopausal state: Secondary | ICD-10-CM | POA: Diagnosis not present

## 2020-06-24 DIAGNOSIS — M8588 Other specified disorders of bone density and structure, other site: Secondary | ICD-10-CM | POA: Insufficient documentation

## 2020-06-24 DIAGNOSIS — Z13820 Encounter for screening for osteoporosis: Secondary | ICD-10-CM | POA: Diagnosis not present

## 2020-06-24 DIAGNOSIS — Z0000 Encounter for general adult medical examination without abnormal findings: Secondary | ICD-10-CM | POA: Insufficient documentation

## 2020-06-24 LAB — CBC
HCT: 42.3 % (ref 35.0–45.0)
Hemoglobin: 13.8 g/dL (ref 11.7–15.5)
MCH: 29.3 pg (ref 27.0–33.0)
MCHC: 32.6 g/dL (ref 32.0–36.0)
MCV: 89.8 fL (ref 80.0–100.0)
MPV: 10.7 fL (ref 7.5–12.5)
Platelets: 221 10*3/uL (ref 140–400)
RBC: 4.71 10*6/uL (ref 3.80–5.10)
RDW: 12.9 % (ref 11.0–15.0)
WBC: 8.6 10*3/uL (ref 3.8–10.8)

## 2020-06-24 LAB — LIPID PANEL
Cholesterol: 203 mg/dL — ABNORMAL HIGH (ref <200)
HDL: 55 mg/dL (ref >=50)
LDL Cholesterol (Calc): 114 mg/dL (calc) — ABNORMAL HIGH
Non-HDL Cholesterol (Calc): 148 mg/dL (calc) — ABNORMAL HIGH (ref <130)
Total CHOL/HDL Ratio: 3.7 (calc) (ref <5.0)
Triglycerides: 218 mg/dL — ABNORMAL HIGH (ref <150)

## 2020-06-24 LAB — URINALYSIS, ROUTINE W REFLEX MICROSCOPIC

## 2020-06-24 LAB — COMPREHENSIVE METABOLIC PANEL
AG Ratio: 1.5 (calc) (ref 1.0–2.5)
ALT: 20 U/L (ref 6–29)
AST: 16 U/L (ref 10–35)
Albumin: 4.2 g/dL (ref 3.6–5.1)
Alkaline phosphatase (APISO): 61 U/L (ref 37–153)
BUN: 11 mg/dL (ref 7–25)
CO2: 27 mmol/L (ref 20–32)
Calcium: 9.3 mg/dL (ref 8.6–10.4)
Chloride: 103 mmol/L (ref 98–110)
Creat: 0.94 mg/dL (ref 0.50–0.99)
Globulin: 2.8 g/dL (calc) (ref 1.9–3.7)
Glucose, Bld: 85 mg/dL (ref 65–99)
Potassium: 4.8 mmol/L (ref 3.5–5.3)
Sodium: 140 mmol/L (ref 135–146)
Total Bilirubin: 0.5 mg/dL (ref 0.2–1.2)
Total Protein: 7 g/dL (ref 6.1–8.1)

## 2020-06-25 ENCOUNTER — Encounter: Payer: Self-pay | Admitting: Family Medicine

## 2020-07-12 DIAGNOSIS — Z961 Presence of intraocular lens: Secondary | ICD-10-CM | POA: Diagnosis not present

## 2020-07-12 DIAGNOSIS — H0102A Squamous blepharitis right eye, upper and lower eyelids: Secondary | ICD-10-CM | POA: Diagnosis not present

## 2020-07-12 DIAGNOSIS — H1013 Acute atopic conjunctivitis, bilateral: Secondary | ICD-10-CM | POA: Diagnosis not present

## 2020-07-12 DIAGNOSIS — H0102B Squamous blepharitis left eye, upper and lower eyelids: Secondary | ICD-10-CM | POA: Diagnosis not present

## 2020-07-12 DIAGNOSIS — H35341 Macular cyst, hole, or pseudohole, right eye: Secondary | ICD-10-CM | POA: Diagnosis not present

## 2020-08-02 ENCOUNTER — Other Ambulatory Visit: Payer: Self-pay | Admitting: Family Medicine

## 2020-08-02 DIAGNOSIS — E7800 Pure hypercholesterolemia, unspecified: Secondary | ICD-10-CM

## 2020-08-27 ENCOUNTER — Other Ambulatory Visit: Payer: Self-pay | Admitting: Family Medicine

## 2020-08-27 DIAGNOSIS — I10 Essential (primary) hypertension: Secondary | ICD-10-CM

## 2020-09-03 ENCOUNTER — Telehealth: Payer: Self-pay

## 2020-09-03 NOTE — Progress Notes (Signed)
    Chronic Care Management Pharmacy Assistant   Name: Julie Dougherty  MRN: [id number] DOB: [birth year]  Reason for Encounter: Medication Review/General Adherence Call.   Recent office visits:  06/21/2020 Dr. [name] MD (PCP) No Medication Changes Noted 06/02/2020 Dr. [name] MD (PCP) start prednisone 20 mg daily, benzonatate 200 mg 2 time daily prn.  Recent consult visits:  04/02/2020 Amy Esterwood PA-C (Gastroenterology) start Augmentin 875 p.o. twice daily x10 days, start low-dose NSAID if needed OTC, and lidocaine ointment OTC to be applied 2-3 times daily as needed.  Hospital visits:  None in previous 6 months  Medications: Outpatient Encounter Medications as of 09/03/2020  Medication Sig   albuterol (VENTOLIN HFA) 108 (90 Base) MCG/ACT inhaler Inhale 1 puff into the lungs every 6 (six) hours as needed for wheezing or shortness of breath. And or cough   amLODipine-benazepril (LOTREL) 5-20 MG capsule Take 1 capsule by mouth once daily   Ascorbic Acid (VITAMIN C) 1000 MG tablet Take 1,000 mg by mouth daily.   benzonatate (TESSALON) 200 MG capsule Take 1 capsule (200 mg total) by mouth 2 (two) times daily as needed for cough.   cetirizine (ZYRTEC) 10 MG tablet Take 10 mg by mouth daily.   cholecalciferol (VITAMIN D3) 25 MCG (1000 UNIT) tablet Take 1,000 Units by mouth daily. Unsure of dosage    omega-3 fish oil (MAXEPA) 1000 MG CAPS capsule Take 1 mg by mouth.   pantoprazole (PROTONIX) 40 MG tablet Take 1 tablet (40 mg total) by mouth 2 (two) times daily before a meal. About 30 minutes before breakfast and supper   pravastatin (PRAVACHOL) 40 MG tablet Take 1 tablet by mouth once daily   No facility-administered encounter medications on file as of 09/03/2020.    Care Gaps: Shingrix Vaccine PNA Vaccine COVID-19 Vaccine Influenza Vaccine Star Rating Drugs: Amlodipine-benazepril 5-20 mg last filled 08/27/2020 90 day supply at [facility]. Pravastatin 40 mg last filled  08/28/2020 90 day supply at [facility]. Medication Fill Gaps: N/A  Called patient and discussed medication adherence  with patient, no issues at this time with current medication.   Patient daughter denies ED visit since her last CPP follow up.  Patient daughter denies any side effects with her medication. Patient daughter denies any problems with her current pharmacy Patient daughter states patient is doing good at this time. Patient is schedule for a office follow up with the clinical pharmacist on 10/06/2020.  [place] Pharmacist Assistant [phone]  LVM 08/05,08/08

## 2020-09-28 ENCOUNTER — Telehealth: Payer: Self-pay

## 2020-09-28 NOTE — Progress Notes (Signed)
Per clinical pharmacist, Please reschedule patient office appointment on 10/06/2020 for CCM at 9:00 am to 11/08/2020 at 3:00 pm .   Patient daughter states she is okay with rescheduling patient appointment, but prefers it to be a telephone visit instead office.  [place] Pharmacist Assistant [phone]

## 2020-10-06 ENCOUNTER — Ambulatory Visit: Payer: Medicare Other

## 2020-11-05 ENCOUNTER — Telehealth: Payer: Self-pay

## 2020-11-05 NOTE — Progress Notes (Signed)
Julie Dougherty was reminded to have all medications, supplements and any blood glucose and blood pressure readings available for review with Junius Argyle, Pharm. D, at her telephone visit on 11/08/2020 at 3:00 pm .  Patient daughter confirm appointment.   Questions: Have you had any recent office visit or specialist visit outside of [place]? NO Are there any concerns you would like to discuss during your office visit? Patient daughter denies any concerns or issues with her current medications.   [place] Pharmacist Assistant [phone]

## 2020-11-08 ENCOUNTER — Ambulatory Visit (INDEPENDENT_AMBULATORY_CARE_PROVIDER_SITE_OTHER): Payer: Medicare Other

## 2020-11-08 DIAGNOSIS — E7800 Pure hypercholesterolemia, unspecified: Secondary | ICD-10-CM

## 2020-11-08 DIAGNOSIS — I10 Essential (primary) hypertension: Secondary | ICD-10-CM

## 2020-11-08 NOTE — Patient Instructions (Signed)
Visit Information It was great speaking with you today!  Please let me know if you have any questions about our visit.   Goals Addressed             This Visit's Progress    Track and Manage My Blood Pressure-Hypertension       Timeframe:  Long-Range Goal Priority:  High Start Date: 11/08/2020                             Expected End Date: 11/08/2021                      Follow Up within 90 days   - check blood pressure weekly    Why is this important?   You won't feel high blood pressure, but it can still hurt your blood vessels.  High blood pressure can cause heart or kidney problems. It can also cause a stroke.  Making lifestyle changes like losing a little weight or eating less salt will help.  Checking your blood pressure at home and at different times of the day can help to control blood pressure.  If the doctor prescribes medicine remember to take it the way the doctor ordered.  Call the office if you cannot afford the medicine or if there are questions about it.     Notes:         Patient Care Plan: General Pharmacy (Adult)     Problem Identified: Hypertension, Hyperlipidemia, and GERD   Priority: High     Long-Range Goal: Patient-Specific Goal   Start Date: 11/08/2020  Expected End Date: 11/08/2021  This Visit's Progress: On track  Priority: High  Note:   Current Barriers:  No barriers noted  Pharmacist Clinical Goal(s):  Patient will maintain control of blood pressure as evidenced by BP less than 140/90  through collaboration with PharmD and provider.   Interventions: 1:1 collaboration with [name], MD regarding development and update of comprehensive plan of care as evidenced by provider attestation and co-signature Inter-disciplinary care team collaboration (see longitudinal plan of care) Comprehensive medication review performed; medication list updated in electronic medical record  Hypertension (BP goal  <140/90) -Controlled -Current treatment: Amlodipine-Benazepril 5-20 mg daily -Medications previously tried: NA  -Current home readings: Typically 110-120s/80s -Denies hypotensive/hypertensive symptoms -Recommended to continue current medication  Hyperlipidemia: (LDL goal < 100) -Controlled -Current treatment: Pravastatin 40 mg daily  -Medications previously tried: NA -Recommended to continue current medication  Patient Goals/Self-Care Activities Patient will:  - check blood pressure weekly, document, and provide at future appointments  Follow Up Plan: Telephone follow up appointment with care management team member scheduled for:  05/05/2021 at 3:00 PM      Patient agreed to services and verbal consent obtained.   Patient verbalizes understanding of instructions provided today and agrees to view in [place].   Junius Argyle, PharmD, Para March, CPP Clinical Pharmacist [facility]  [phone]

## 2020-11-08 NOTE — Progress Notes (Signed)
Chronic Care Management Pharmacy Note  11/08/2020 Name:  Julie Dougherty MRN:  [id number] DOB:  09-06-1950  Summary: Patient presents for CCM follow-up. She continues to monitor her home blood pressure periodically and remains well controlled.   Recommendations/Changes made from today's visit: Continue current medications  Plan: CPP follow-up 6 months   Subjective: Julie Dougherty is an 70 y.o. year old female who is a primary patient of [name], Mortimer Fries, MD.  The CCM team was consulted for assistance with disease management and care coordination needs.    Engaged with patient by telephone for follow up visit in response to provider referral for pharmacy case management and/or care coordination services.   Consent to Services:  The patient was given information about Chronic Care Management services, agreed to services, and gave verbal consent prior to initiation of services.  Please see initial visit note for detailed documentation.   Patient Care Team: [name], MD as PCP - General (Family Medicine) Barton Fanny, MD (Inactive) (Family Medicine) [name], MD as Consulting Physician (Gastroenterology) [name], [facility] as Pharmacist (Pharmacist)  Recent office visits: 06/21/20: Patient presented to Dr. [name] for follow-up.   Recent consult visits: None in previous 6 months  Hospital visits: None in previous 6 months   Objective:  Lab Results  Component Value Date   CREATININE 0.94 06/21/2020   BUN 11 06/21/2020   GFR 55.75 (L) 06/04/2019   GFRNONAA >60 03/07/2017   GFRAA >60 03/07/2017   NA 140 06/21/2020   K 4.8 06/21/2020   CALCIUM 9.3 06/21/2020   CO2 27 06/21/2020   GLUCOSE 85 06/21/2020    Lab Results  Component Value Date/Time   HGBA1C 5.7 01/02/2018 12:00 AM   HGBA1C 5.7 10/22/2015 09:42 AM   GFR 55.75 (L) 06/04/2019 11:02 AM   GFR 57.21 (L) 08/30/2018 10:10 AM    Last diabetic Eye  exam: No results found for: HMDIABEYEEXA  Last diabetic Foot exam: No results found for: HMDIABFOOTEX   Lab Results  Component Value Date   CHOL 203 (H) 06/21/2020   HDL 55 06/21/2020   LDLCALC 114 (H) 06/21/2020   LDLDIRECT 114.0 06/04/2019   TRIG 218 (H) 06/21/2020   CHOLHDL 3.7 06/21/2020    Hepatic Function Latest Ref Rng & Units 06/21/2020 06/04/2019 07/16/2018  Total Protein 6.1 - 8.1 g/dL 7.0 7.3 7.6  Albumin 3.5 - 5.2 g/dL - 4.2 4.2  AST 10 - 35 U/L '16 14 14  ' ALT 6 - 29 U/L '20 15 15  ' Alk Phosphatase 39 - 117 U/L - 69 70  Total Bilirubin 0.2 - 1.2 mg/dL 0.5 0.5 0.4  Bilirubin, Direct 0.0 - 0.3 mg/dL - - -    Lab Results  Component Value Date/Time   TSH 1.45 06/04/2019 11:02 AM   TSH 1.95 01/02/2018 12:00 AM   TSH 1.397 02/01/2010 09:40 PM    CBC Latest Ref Rng & Units 06/21/2020 06/04/2019 07/16/2018  WBC 3.8 - 10.8 Thousand/uL 8.6 6.1 6.6  Hemoglobin 11.7 - 15.5 g/dL 13.8 13.4 13.8  Hematocrit 35.0 - 45.0 % 42.3 41.6 42.1  Platelets 140 - 400 Thousand/uL 221 241.0 251.0    Lab Results  Component Value Date/Time   VD25OH 28 (L) 10/22/2008 07:52 PM    Clinical ASCVD: No  The 10-year ASCVD risk score (Arnett DK, et al., 2019) is: 8.5%   Values used to calculate the score:     Age: 11 years     Sex:  Female     Is Non-Hispanic African American: No     Diabetic: No     Tobacco smoker: No     Systolic Blood Pressure: 202 mmHg     Is BP treated: Yes     HDL Cholesterol: 55 mg/dL     Total Cholesterol: 203 mg/dL    Depression screen [facility] 2/9 06/21/2020 06/21/2020 09/18/2019  Decreased Interest 0 0 0  Down, Depressed, Hopeless 0 0 0  PHQ - 2 Score 0 0 0  Altered sleeping 0 - -  Tired, decreased energy 0 - -  Change in appetite 0 - -  Feeling bad or failure about yourself  0 - -  Trouble concentrating 0 - -  Moving slowly or fidgety/restless 0 - -  Suicidal thoughts 0 - -  PHQ-9 Score 0 - -  Difficult doing work/chores Not difficult at all - -    Social History    Tobacco Use  Smoking Status Former  Smokeless Tobacco Never  Tobacco Comments   "when she was young"   BP Readings from Last 3 Encounters:  06/21/20 110/70  04/02/20 94/68  10/17/19 116/74   Pulse Readings from Last 3 Encounters:  06/21/20 86  04/02/20 80  10/17/19 80   Wt Readings from Last 3 Encounters:  06/21/20 200 lb 3.2 oz (90.8 kg)  06/02/20 200 lb (90.7 kg)  04/02/20 203 lb 6 oz (92.3 kg)   BMI Readings from Last 3 Encounters:  06/21/20 39.10 kg/m  06/02/20 39.06 kg/m  04/02/20 39.07 kg/m    Assessment/Interventions: Review of patient past medical history, allergies, medications, health status, including review of consultants reports, laboratory and other test data, was performed as part of comprehensive evaluation and provision of chronic care management services.   SDOH:  (Social Determinants of Health) assessments and interventions performed: Yes SDOH Interventions    Flowsheet Row Most Recent Value  SDOH Interventions   Financial Strain Interventions Intervention Not Indicated      SDOH Screenings   Alcohol Screen: Not on file  Depression (PHQ2-9): Low Risk    PHQ-2 Score: 0  Financial Resource Strain: Low Risk    Difficulty of Paying Living Expenses: Not hard at all  Food Insecurity: Not on file  Housing: Not on file  Physical Activity: Not on file  Social Connections: Not on file  Stress: Not on file  Tobacco Use: Medium Risk   Smoking Tobacco Use: Former   Smokeless Tobacco Use: Never  Transportation Needs: Not on file    [place]  Allergies  Allergen Reactions   Ciprofloxacin Hives and Nausea Only   Flagyl [Metronidazole] Hives and Nausea Only    Medications Reviewed Today     Reviewed by [name], [place] (Pharmacist) on 11/08/20 at [place] List Status: <None>   Medication Order Taking? Sig Documenting Provider Last Dose Status Informant  albuterol (VENTOLIN HFA) 108 (90 Base) MCG/ACT inhaler [id number]  Inhale  1 puff into the lungs every 6 (six) hours as needed for wheezing or shortness of breath. And or cough [name], MD  Active   amLODipine-benazepril (LOTREL) 5-20 MG capsule [id number]  Take 1 capsule by mouth once daily [name], MD  Active   Ascorbic Acid (VITAMIN C) 1000 MG tablet [id number] Yes Take 1,000 mg by mouth daily. [provider] Taking Active Self  benzonatate (TESSALON) 200 MG capsule [id number]  Take 1 capsule (200 mg total) by mouth 2 (two) times daily as  needed for cough. [name], MD  Active   cetirizine (ZYRTEC) 10 MG tablet [id number]  Take 10 mg by mouth daily. [provider]  Active   cholecalciferol (VITAMIN D3) 25 MCG (1000 UNIT) tablet [id number] Yes Take 1,000 Units by mouth daily. Unsure of dosage  [provider] Taking Active Self  Multiple Vitamins-Minerals (MULTIVITAMIN WITH MINERALS) tablet [id number] Yes Take 1 tablet by mouth daily. [provider] Taking Active   omega-3 fish oil (MAXEPA) 1000 MG CAPS capsule [id number] Yes Take 1 mg by mouth. [provider] Taking Active Family Member  pantoprazole (PROTONIX) 40 MG tablet [id number]  Take 1 tablet (40 mg total) by mouth 2 (two) times daily before a meal. 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   Status post total knee replacement, left 08/29/2016   Baker's cyst of knee, left - suspected 05/30/2016   Diverticulitis of colon  03/17/2016   Hemorrhoids, internal, with bleeding Gr 2 prolapsed 06/30/2011   Diverticulitis 06/06/2011   Rectal bleeding 04/19/2009   ABDOMINAL PAIN, RIGHT LOWER QUADRANT 04/19/2009   Elevated cholesterol 07/11/2006   Essential hypertension 07/11/2006   GERD 07/11/2006    Immunization History  Administered Date(s) Administered   Influenza,inj,Quad PF,6+ Mos 12/01/2015   Influenza-Unspecified 01/30/2018   PFIZER(Purple Top)SARS-COV-2 Vaccination 04/03/2019, 05/06/2019, 11/15/2019   Tdap 06/04/2019    Conditions to be addressed/monitored:  Hypertension, Hyperlipidemia, and GERD  Care Plan : General Pharmacy (Adult)  Updates made by [name], RPH since 11/08/2020 12:00 AM     Problem: Hypertension, Hyperlipidemia, and GERD   Priority: High     Long-Range Goal: Patient-Specific Goal   Start Date: 11/08/2020  Expected End Date: 11/08/2021  This Visit's Progress: On track  Priority: High  Note:   Current Barriers:  No barriers noted  Pharmacist Clinical Goal(s):  Patient will maintain control of blood pressure as evidenced by BP less than 140/90  through collaboration with PharmD and provider.   Interventions: 1:1 collaboration with [name], MD regarding development and update of comprehensive plan of care as evidenced by provider attestation and co-signature Inter-disciplinary care team collaboration (see longitudinal plan of care) Comprehensive medication review performed; medication list updated in electronic medical record  Hypertension (BP goal <140/90) -Controlled -Current treatment: Amlodipine-Benazepril 5-20 mg daily -Medications previously tried: NA  -Current home readings: Typically 110-120s/80s -Denies hypotensive/hypertensive symptoms -Recommended to continue current medication  Hyperlipidemia: (LDL goal < 100) -Controlled -Current treatment: Pravastatin 40 mg daily  -Medications previously tried: NA -Recommended to continue  current medication  Patient Goals/Self-Care Activities Patient will:  - check blood pressure weekly, document, and provide at future appointments  Follow Up Plan: Telephone follow up appointment with care management team member scheduled for:  05/05/2021 at 3:00 PM      Medication Assistance: None required.  Patient affirms current coverage meets needs.  Compliance/Adherence/Medication fill history: Care Gaps: Shingrix Covid Booster Influenza  Star-Rating  Drugs: Amlodipine-Benazepril 5-20 mg last filled 09/09/20 for 90-DS Pravastatin 40 mg last filled 08/28/20 for 90-DS  Patient's preferred pharmacy is:  [facility], [address] [address] Phone: [phone] Fax: [phone]  Uses pill box? Yes Pt endorses 100% compliance  We discussed: Current pharmacy is preferred with insurance plan and patient is satisfied with pharmacy services Patient decided to: Continue current medication management strategy  Care Plan and Follow Up Patient Decision:  Patient agrees to Care Plan and Follow-up.  Plan: Telephone follow up appointment with care management team member scheduled for:  05/05/2021 at 3:00 PM  Junius Argyle, PharmD, Para March, CPP Clinical Pharmacist [facility] [facility] at [facility]  [phone]

## 2020-11-26 ENCOUNTER — Telehealth: Payer: Self-pay | Admitting: Family Medicine

## 2020-11-26 NOTE — Telephone Encounter (Signed)
Patient called asking if she could do a transfer of care from Dr. [name] at [facility] to Dr. Jerilee Hoh here at [facility].  Patient would like a call at [phone] once a decision has been made.  Please advise.

## 2020-11-29 ENCOUNTER — Other Ambulatory Visit: Payer: Self-pay | Admitting: Family Medicine

## 2020-11-29 DIAGNOSIS — E7800 Pure hypercholesterolemia, unspecified: Secondary | ICD-10-CM

## 2020-11-29 DIAGNOSIS — I10 Essential (primary) hypertension: Secondary | ICD-10-CM

## 2020-11-30 NOTE — Telephone Encounter (Signed)
Called patient and scheduled a transfer of care appointment with Dr. Jerilee Hoh for 11/08.

## 2020-12-07 ENCOUNTER — Ambulatory Visit (INDEPENDENT_AMBULATORY_CARE_PROVIDER_SITE_OTHER): Payer: Medicare Other | Admitting: Internal Medicine

## 2020-12-07 ENCOUNTER — Other Ambulatory Visit: Payer: Self-pay

## 2020-12-07 VITALS — BP 118/84 | HR 69 | Temp 97.6°F | Ht 60.0 in | Wt 203.0 lb

## 2020-12-07 DIAGNOSIS — R600 Localized edema: Secondary | ICD-10-CM | POA: Diagnosis not present

## 2020-12-07 DIAGNOSIS — M5412 Radiculopathy, cervical region: Secondary | ICD-10-CM

## 2020-12-07 DIAGNOSIS — E7800 Pure hypercholesterolemia, unspecified: Secondary | ICD-10-CM | POA: Diagnosis not present

## 2020-12-07 DIAGNOSIS — Z23 Encounter for immunization: Secondary | ICD-10-CM | POA: Diagnosis not present

## 2020-12-07 DIAGNOSIS — I10 Essential (primary) hypertension: Secondary | ICD-10-CM

## 2020-12-07 MED ORDER — BENAZEPRIL HCL 40 MG PO TABS: 40.0000 mg | ORAL_TABLET | Freq: Every day | ORAL | 1 refills | Status: DC

## 2020-12-07 MED ORDER — PRAVASTATIN SODIUM 40 MG PO TABS: 40.0000 mg | ORAL_TABLET | Freq: Every day | ORAL | 1 refills | Status: DC

## 2020-12-07 NOTE — Addendum Note (Signed)
Addended by: Wyvonne Lenz on: 12/07/2020 03:27 PM   Modules accepted: Orders

## 2020-12-07 NOTE — Patient Instructions (Signed)
-  Vacunas contra el flu y la pneumonia hoy.  -Medicamento nuevo para la presion se llama benazepril de 40 mg. Se va a tomar 1 al dia.  -Regrese en 8 semanas.  -MRI para la columna cervical.

## 2020-12-07 NOTE — Progress Notes (Signed)
Established Patient Office Visit     This visit occurred during the SARS-CoV-2 public health emergency.  Safety protocols were in place, including screening questions prior to the visit, additional usage of staff PPE, and extensive cleaning of exam room while observing appropriate contact time as indicated for disinfecting solutions.    CC/Reason for Visit: Establish care, discuss chronic medical issues and some acute concerns  HPI: Julie Dougherty is a 70 y.o. female who is coming in today for the above mentioned reasons. Past Medical History is significant for: Hypertension, hyperlipidemia on medication, GERD on PPI therapy.  She is overdue for flu, COVID booster, pneumonia, shingles vaccines.  For over a year she has noticed right neck and upper arm pain, more recently probably over the past 4 to 6 weeks, has been noticing significant numbness and pain radiating down her right arm into her fingertips.  No recent injury that she can recall.  She has also been having significant bilateral lower extremity edema.   Past Medical/Surgical History: Past Medical History:  Diagnosis Date   Arthritis    Cataract    Chronic constipation    Diverticulitis    Diverticulosis    Fatty liver    GERD (gastroesophageal reflux disease)    Hemorrhoids, internal, with bleeding Gr 2 prolapsed 06/30/2011   Found on colonoscopy 2011 and anoscopy May 2013    Hepatitis    "B"    no rx >10 yrs   Hiatal hernia    High cholesterol    Hypertension    Internal hemorrhoids    OSA (obstructive sleep apnea)    does not use CPAP   Pre-diabetes    PUD (peptic ulcer disease)    Rectal ulceration 04/27/2009   Associated reactive/regenerative changes and fibromuscular extensions into lamina propria/Mucosal Prolapse   Sleep apnea    Waiting on CPAP machine    Past Surgical History:  Procedure Laterality Date   ABDOMINAL HYSTERECTOMY     CATARACT EXTRACTION Right 03/2018   COLONOSCOPY   04/27/09    POLYPOID FRAGMENT OF COLONIC MUCOSA WITH SURFACE   EVALUATION UNDER ANESTHESIA WITH HEMORRHOIDECTOMY N/A 03/21/2019   Procedure: OPEN HEMORRHOIDECTOMY;  Surgeon: Armandina Gemma, MD;  Location: [facility];  Service: General;  Laterality: N/A;   HEMORRHOID BANDING     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 She has never used smokeless tobacco. She reports that she does not drink alcohol and does not use drugs.  Allergies: Allergies  Allergen Reactions   Ciprofloxacin Hives and Nausea Only   Flagyl [Metronidazole] Hives and Nausea Only    Family History:  Family History  Problem Relation Age of Onset   Diabetes Sister    Asthma Sister    Hyperlipidemia Sister    Hypertension Sister    Colon cancer Father 84   Cancer Father    Arthritis Mother    Hypertension Mother    Depression Sister    Mental illness Sister    Esophageal cancer Neg Hx    Pancreatic cancer Neg Hx    Stomach cancer  Neg Hx      Current Outpatient Medications:    Ascorbic Acid (VITAMIN C) 1000 MG tablet, Take 1,000 mg by mouth daily., Disp: , Rfl:    benazepril (LOTENSIN) 40 MG tablet, Take 1 tablet (40 mg total) by mouth daily., Disp: 90 tablet, Rfl: 1   cetirizine (ZYRTEC) 10 MG tablet, Take 10 mg by mouth daily., Disp: , Rfl:    cholecalciferol (VITAMIN D3) 25 MCG (1000 UNIT) tablet, Take 1,000 Units by mouth daily. Unsure of dosage , Disp: , Rfl:    Multiple Vitamins-Minerals (MULTIVITAMIN WITH MINERALS) tablet, Take 1 tablet by mouth daily., Disp: , Rfl:    omega-3 fish oil (MAXEPA)  1000 MG CAPS capsule, Take 1 mg by mouth., Disp: , Rfl:    pantoprazole (PROTONIX) 40 MG tablet, Take 1 tablet (40 mg total) by mouth 2 (two) times daily before a meal. About 30 minutes before breakfast and supper, Disp: 180 tablet, Rfl: 3   Polyethylene Glycol 3350 (MIRALAX PO), Take by mouth as needed., Disp: , Rfl:    pravastatin (PRAVACHOL) 40 MG tablet, Take 1 tablet (40 mg total) by mouth daily., Disp: 90 tablet, Rfl: 1  Review of Systems:  Constitutional: Denies fever, chills, diaphoresis, appetite change and fatigue.  HEENT: Denies photophobia, eye pain, redness, hearing loss, ear pain, congestion, sore throat, rhinorrhea, sneezing, mouth sores, trouble swallowing, neck pain, neck stiffness and tinnitus.   Respiratory: Denies SOB, DOE, cough, chest tightness,  and wheezing.   Cardiovascular: Denies chest pain, palpitations..  Gastrointestinal: Denies nausea, vomiting, abdominal pain, diarrhea, constipation, blood in stool and abdominal distention.  Genitourinary: Denies dysuria, urgency, frequency, hematuria, flank pain and difficulty urinating.  Endocrine: Denies: hot or cold intolerance, sweats, changes in hair or nails, polyuria, polydipsia. Musculoskeletal: Denies myalgias, joint swelling, arthralgias and gait problem.  Skin: Denies pallor, rash and wound.  Neurological: Denies dizziness, seizures, syncope, weakness, light-headedness,  and headaches.  Hematological: Denies adenopathy. Easy bruising, personal or family bleeding history  Psychiatric/Behavioral: Denies suicidal ideation, mood changes, confusion, nervousness, sleep disturbance and agitation    Physical Exam: Vitals:   12/07/20 1403  BP: 118/84  Pulse: 69  Temp: 97.6 F (36.4 C)  TempSrc: Oral  SpO2: 97%  Weight: 203 lb (92.1 kg)  Height: 5' (1.524 m)    Body mass index is 39.65 kg/m.   Constitutional: NAD, calm, comfortable Eyes: PERRL, lids and conjunctivae normal ENMT: Mucous membranes are moist.   Respiratory: clear to auscultation bilaterally, no wheezing, no crackles. Normal respiratory effort. No accessory muscle use.  Cardiovascular: Regular rate and rhythm, no murmurs / rubs / gallops. No extremity edema.  Psychiatric: Normal judgment and insight. Alert and oriented x 3. Normal mood.    Impression and Plan:  Cervical radiculopathy  - Plan: MR Cervical Spine Wo Contrast  Bilateral leg edema Essential hypertension  - Plan: benazepril (LOTENSIN) 40 MG tablet -Blood pressure is well controlled today, however I am concerned that her bilateral lower extremity edema might be related to her amlodipine. -I will discontinue Lotrel and instead put her on a higher dose of benazepril alone, 40 mg. -She will do ambulatory blood pressure monitoring and return in 8 weeks for follow-up.  Elevated cholesterol  - Plan: pravastatin (PRAVACHOL) 40 MG tablet  Need for vaccination against Streptococcus pneumoniae -PCV 20 administered today.  Need for influenza vaccination -Flu vaccine administered today.  Time spent: 33 minutes reviewing chart, interviewing patient and daughter, examining patient and formulating plan of care.   Patient  Instructions  -Vacunas contra el flu y la pneumonia hoy.  -Medicamento nuevo para la presion se llama benazepril de 40 mg. Se va a tomar 1 al dia.  -Regrese en 8 semanas.  -MRI para la columna cervical.    Lelon Frohlich, MD [facility]

## 2020-12-27 ENCOUNTER — Ambulatory Visit: Payer: Medicare Other | Admitting: Family Medicine

## 2021-03-03 ENCOUNTER — Other Ambulatory Visit: Payer: Self-pay

## 2021-03-03 ENCOUNTER — Other Ambulatory Visit: Payer: Medicare Other

## 2021-03-03 ENCOUNTER — Ambulatory Visit
Admission: RE | Admit: 2021-03-03 | Discharge: 2021-03-03 | Disposition: A | Discharge status: Home / Self Care | Payer: Medicare Other | Source: Ambulatory Visit | Attending: Internal Medicine | Admitting: Internal Medicine

## 2021-03-03 DIAGNOSIS — M5412 Radiculopathy, cervical region: Secondary | ICD-10-CM | POA: Diagnosis not present

## 2021-03-18 DIAGNOSIS — Z1231 Encounter for screening mammogram for malignant neoplasm of breast: Secondary | ICD-10-CM | POA: Diagnosis not present

## 2021-03-18 LAB — HM MAMMOGRAPHY

## 2021-03-24 ENCOUNTER — Encounter: Payer: Self-pay | Admitting: Internal Medicine

## 2021-05-05 ENCOUNTER — Telehealth: Payer: Medicare Other

## 2021-05-10 ENCOUNTER — Telehealth: Payer: Self-pay | Admitting: Pharmacist

## 2021-05-10 NOTE — Chronic Care Management (AMB) (Signed)
? ? ?  Chronic Care Management ?Pharmacy Assistant  ? ?Name: Mayeli Bornhorst  MRN: [id number] DOB: 1951/01/20 ? ?Reason for Encounter: Check in with patient to see if she has any medication issues or is needing anything. ?I spoke with patients daughter in law and the patient is out of the [place] due to her father passing away.  It is unknown when she will return.  ?  ? ?Gennie Alma CMA  ?Clinical Pharmacist Assistant ?[phone] ? ?

## 2021-06-02 ENCOUNTER — Other Ambulatory Visit: Payer: Self-pay | Admitting: Physician Assistant

## 2021-06-02 ENCOUNTER — Other Ambulatory Visit: Payer: Self-pay | Admitting: Internal Medicine

## 2021-06-02 DIAGNOSIS — K219 Gastro-esophageal reflux disease without esophagitis: Secondary | ICD-10-CM

## 2021-06-02 DIAGNOSIS — I10 Essential (primary) hypertension: Secondary | ICD-10-CM

## 2021-07-13 DIAGNOSIS — H35341 Macular cyst, hole, or pseudohole, right eye: Secondary | ICD-10-CM | POA: Diagnosis not present

## 2021-07-13 DIAGNOSIS — H0102B Squamous blepharitis left eye, upper and lower eyelids: Secondary | ICD-10-CM | POA: Diagnosis not present

## 2021-07-13 DIAGNOSIS — Z961 Presence of intraocular lens: Secondary | ICD-10-CM | POA: Diagnosis not present

## 2021-07-13 DIAGNOSIS — H1013 Acute atopic conjunctivitis, bilateral: Secondary | ICD-10-CM | POA: Diagnosis not present

## 2021-07-13 DIAGNOSIS — H0102A Squamous blepharitis right eye, upper and lower eyelids: Secondary | ICD-10-CM | POA: Diagnosis not present

## 2021-07-13 DIAGNOSIS — H26493 Other secondary cataract, bilateral: Secondary | ICD-10-CM | POA: Diagnosis not present

## 2021-09-07 ENCOUNTER — Other Ambulatory Visit: Payer: Self-pay | Admitting: Internal Medicine

## 2021-09-07 DIAGNOSIS — I10 Essential (primary) hypertension: Secondary | ICD-10-CM

## 2021-10-27 IMAGING — MR MR THORACIC SPINE W/O CM
4 of 6 series · 17 of 48 positions shown · non-contrast
Comparison: Thoracic spine radiographs 09/08/2019.

CLINICAL DATA: 69-year-old female with persistent midthoracic, mid
back and right side pain for 5 months with no known injury.

EXAM:
MRI THORACIC SPINE WITHOUT CONTRAST
TECHNIQUE: Multiplanar, multisequence MR imaging of the thoracic spine was
performed. No intravenous contrast was administered.

[Series 4: T2 · sagittal · 4.0mm · 0.47mm/px · 6 of 17 slices shown (1 of 3)]
[im 1/17]
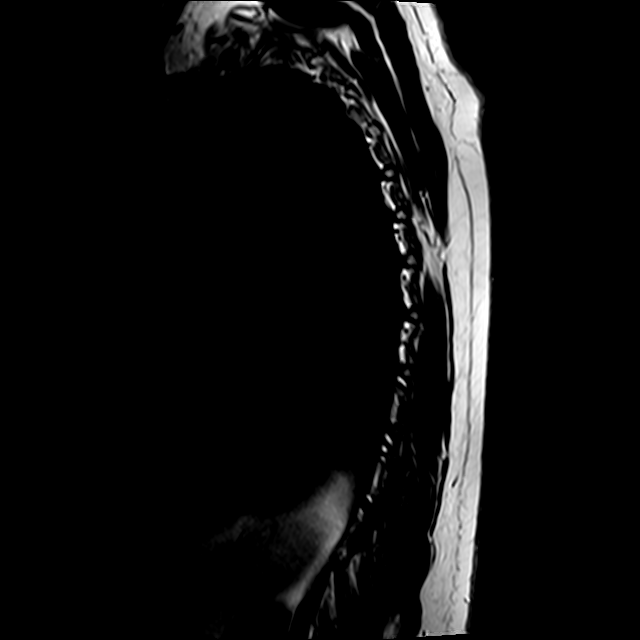
[im 4/17]
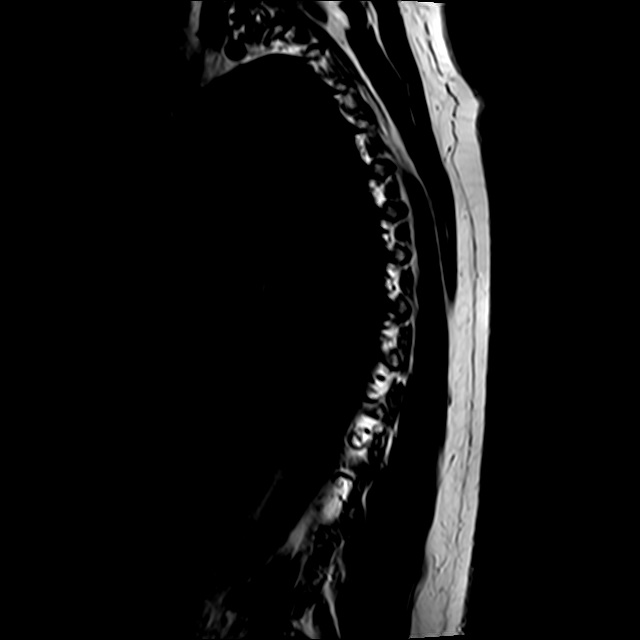
[im 7/17]
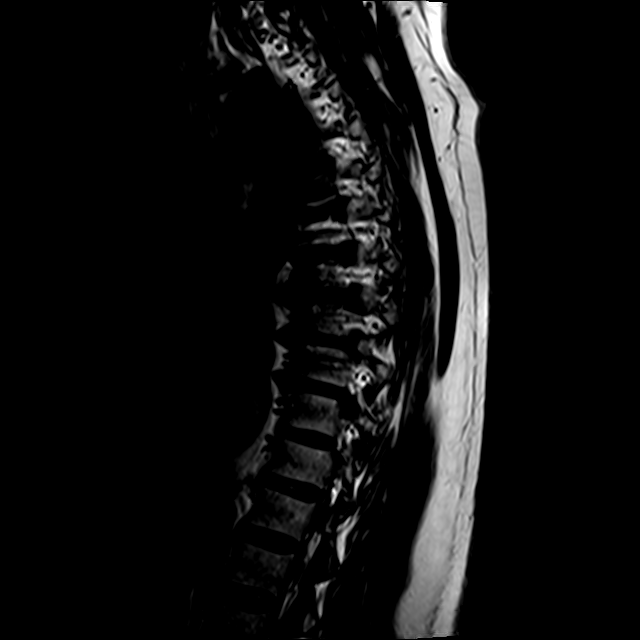
[im 10/17]
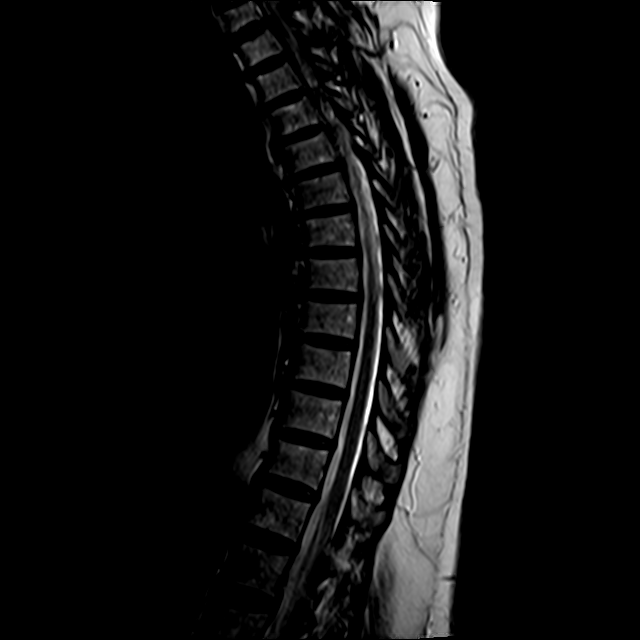
[im 13/17]
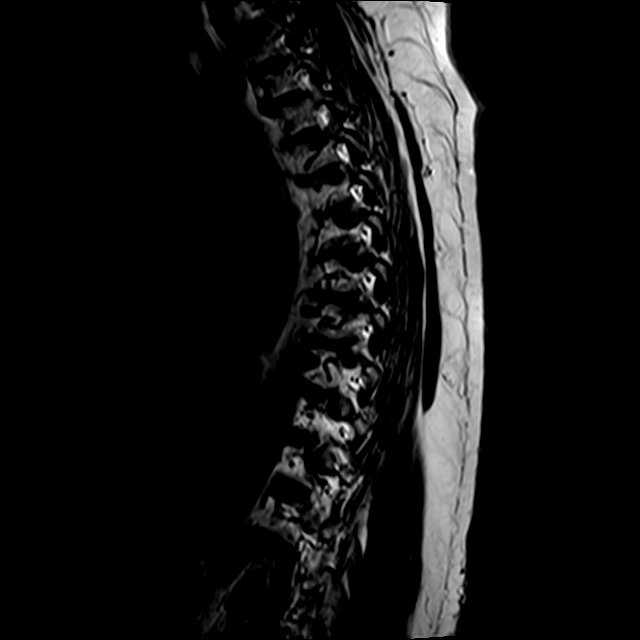
[im 17/17]
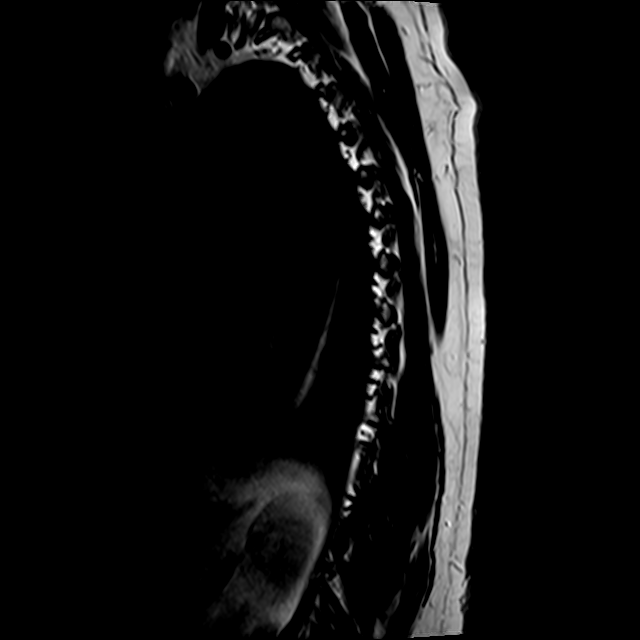

[Series 6: T1 · sagittal · 4.0mm · 0.94mm/px · 3 of 17 slices shown]
[im 1/17]
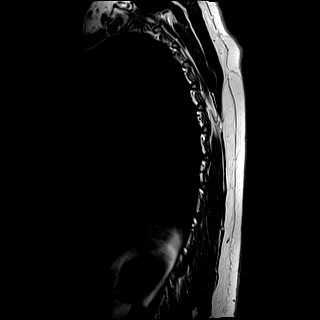
[im 9/17]
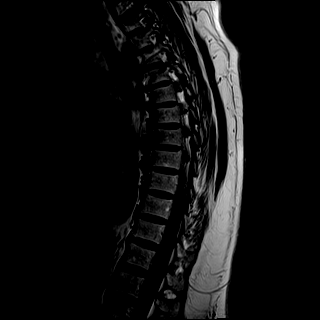
[im 17/17]
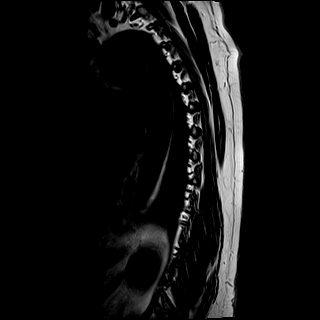

[Series 7: T2 · axial · 4.0mm · 0.39mm/px · z∈[-226,-39]mm · 5 of 42 slices shown (2 of 3)]
[im 1/42]
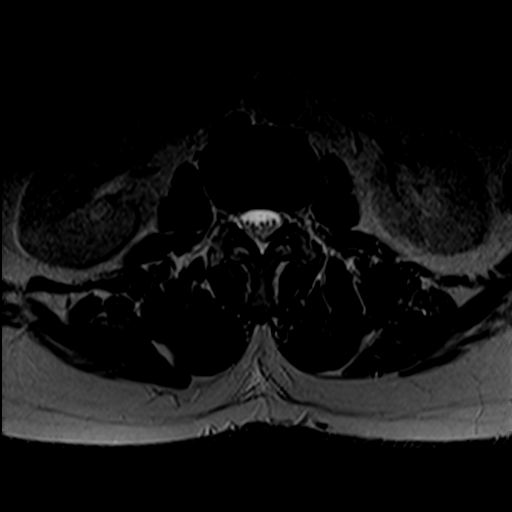
[im 7/42]
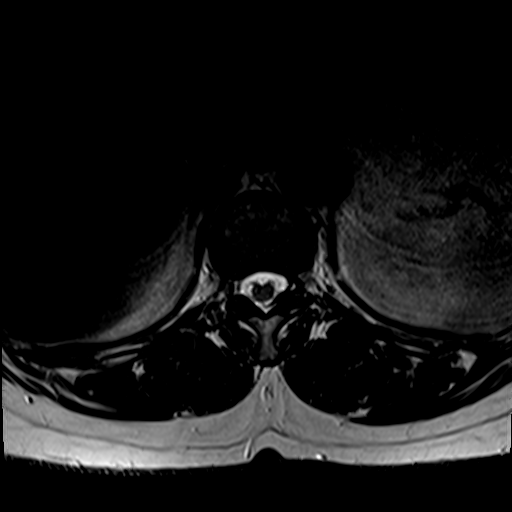
[im 14/42]
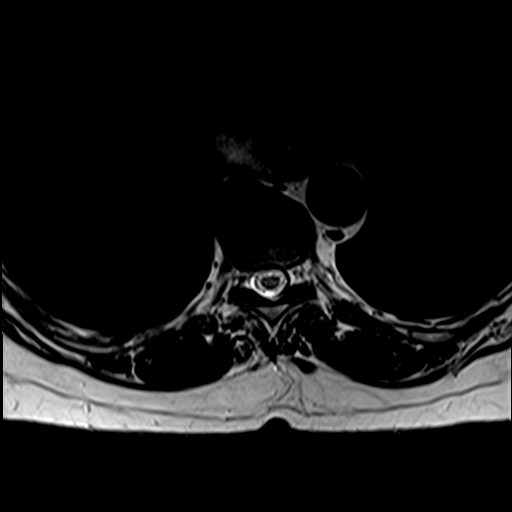
[im 21/42]
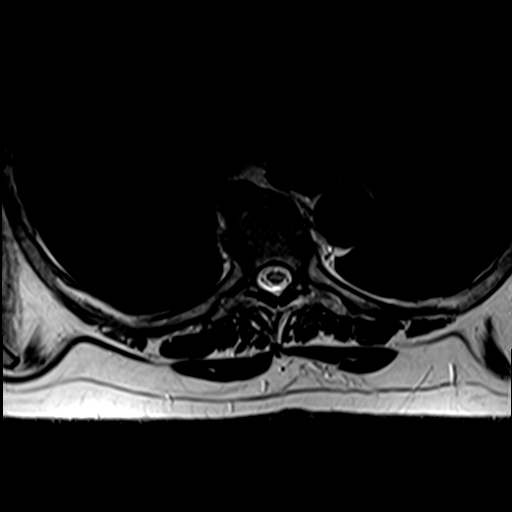
[im 35/42]
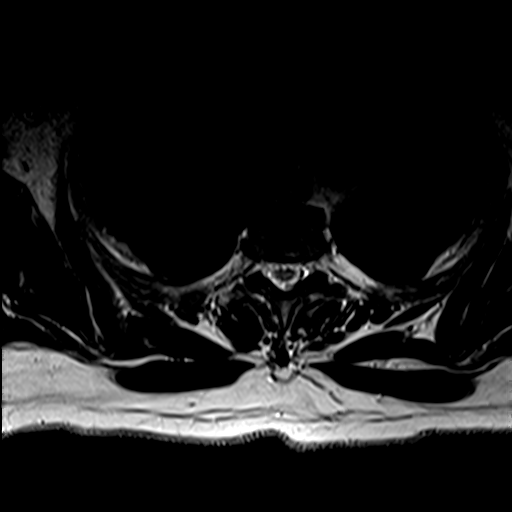

[Series 8: T2 · axial · 4.0mm · 0.39mm/px · z∈[-169,-39]mm · 3 of 42 slices shown (3 of 3)]
[im 7/42]
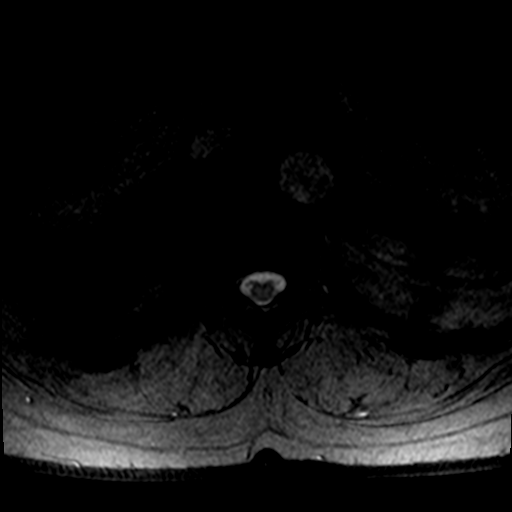
[im 21/42]
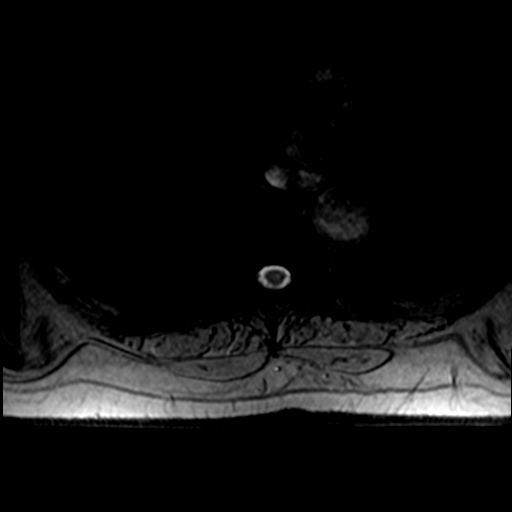
[im 35/42]
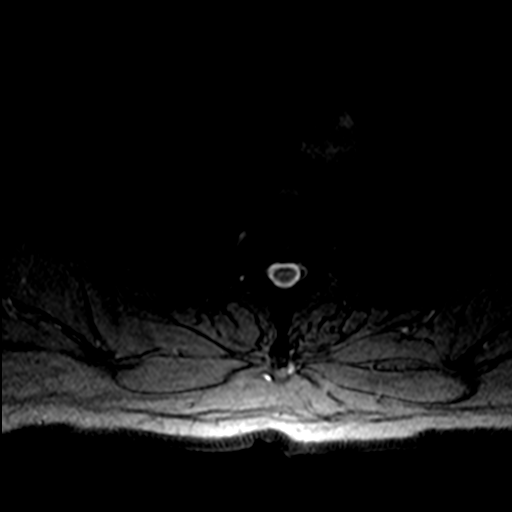

[17 of 48 positions shown; findings below may reference images not displayed]

FINDINGS: Limited cervical spine imaging:  Negative.

Thoracic spine segmentation:  Normal on the comparison.

Alignment: Stable thoracic kyphosis seen last month. No
spondylolisthesis.

Vertebrae: Confluent marrow and/or paraspinal soft tissue edema is
associated with a rather bulky right anterior T7-T8 endplate
osteophyte (series 5, image 6). Faint degenerative marrow edema also
in the anterior T7 vertebral body. But normal background bone marrow
signal. And no other marrow edema or evidence of acute osseous
abnormality.

Cord:  Normal.  Normal conus medullaris at T12-L1.

Paraspinal and other soft tissues: Normal thoracic paraspinal soft
tissues aside from those adjacent to the anterior T7-T8 endplate
osteophyte described above. No paraspinal fluid collection. There is
a small to moderate gastric hiatal hernia. Otherwise negative
visible chest and upper abdominal viscera.

Disc levels:

Multilevel right anterolateral degenerative endplate osteophytosis,
T5 through T10 as seen on radiographs last month.

But otherwise no age advanced thoracic spine degeneration. No
significant disc degeneration. No thoracic spinal stenosis. No
thoracic neural foraminal stenosis.
IMPRESSION: 1. Positive for reactive edema in and around bulky right anterior
endplate osteophytes of the T7-T8 vertebrae.
This appears degenerative in nature, and occurs on a background of
degenerative endplate spurring from T5 through T10.

2. But otherwise largely normal for age thoracic spine. No
significant disc disease. No spinal or foraminal stenosis.

3. Small to moderate gastric hiatal hernia.

## 2021-12-12 ENCOUNTER — Other Ambulatory Visit: Payer: Self-pay | Admitting: Internal Medicine

## 2021-12-12 DIAGNOSIS — I10 Essential (primary) hypertension: Secondary | ICD-10-CM

## 2021-12-13 ENCOUNTER — Ambulatory Visit (INDEPENDENT_AMBULATORY_CARE_PROVIDER_SITE_OTHER): Payer: Medicare Other | Admitting: Internal Medicine

## 2021-12-13 VITALS — BP 120/84 | HR 76 | Temp 97.4°F | Ht 61.0 in | Wt 208.1 lb

## 2021-12-13 DIAGNOSIS — Z124 Encounter for screening for malignant neoplasm of cervix: Secondary | ICD-10-CM

## 2021-12-13 DIAGNOSIS — I10 Essential (primary) hypertension: Secondary | ICD-10-CM

## 2021-12-13 DIAGNOSIS — E7800 Pure hypercholesterolemia, unspecified: Secondary | ICD-10-CM

## 2021-12-13 DIAGNOSIS — N3000 Acute cystitis without hematuria: Secondary | ICD-10-CM | POA: Diagnosis not present

## 2021-12-13 DIAGNOSIS — Z23 Encounter for immunization: Secondary | ICD-10-CM | POA: Diagnosis not present

## 2021-12-13 DIAGNOSIS — Z0000 Encounter for general adult medical examination without abnormal findings: Secondary | ICD-10-CM | POA: Diagnosis not present

## 2021-12-13 LAB — COMPREHENSIVE METABOLIC PANEL
ALT: 20 U/L (ref 0–35)
AST: 18 U/L (ref 0–37)
Albumin: 4.6 g/dL (ref 3.5–5.2)
Alkaline Phosphatase: 63 U/L (ref 39–117)
BUN: 14 mg/dL (ref 6–23)
CO2: 26 mEq/L (ref 19–32)
Calcium: 9.9 mg/dL (ref 8.4–10.5)
Chloride: 100 mEq/L (ref 96–112)
Creatinine, Ser: 0.89 mg/dL (ref 0.40–1.20)
GFR: 65.56 mL/min (ref >60.00)
Glucose, Bld: 89 mg/dL (ref 70–99)
Potassium: 4.3 mEq/L (ref 3.5–5.1)
Sodium: 137 mEq/L (ref 135–145)
Total Bilirubin: 0.4 mg/dL (ref 0.2–1.2)
Total Protein: 8.5 g/dL — ABNORMAL HIGH (ref 6.0–8.3)

## 2021-12-13 LAB — POCT URINALYSIS DIPSTICK
Bilirubin, UA: NEGATIVE
Blood, UA: NEGATIVE
Glucose, UA: NEGATIVE
Ketones, UA: NEGATIVE
Nitrite, UA: NEGATIVE
Protein, UA: NEGATIVE
Spec Grav, UA: 1.01 (ref 1.010–1.025)
Urobilinogen, UA: 0.2 E.U./dL
pH, UA: 6 (ref 5.0–8.0)

## 2021-12-13 LAB — LIPID PANEL
Cholesterol: 231 mg/dL — ABNORMAL HIGH (ref 0–200)
HDL: 55.1 mg/dL (ref >39.00)
NonHDL: 176.24
Total CHOL/HDL Ratio: 4
Triglycerides: 242 mg/dL — ABNORMAL HIGH (ref 0.0–149.0)
VLDL: 48.4 mg/dL — ABNORMAL HIGH (ref 0.0–40.0)

## 2021-12-13 LAB — URINALYSIS, ROUTINE W REFLEX MICROSCOPIC
Bilirubin Urine: NEGATIVE
Ketones, ur: NEGATIVE
Nitrite: NEGATIVE
Specific Gravity, Urine: 1.01 (ref 1.000–1.030)
Total Protein, Urine: NEGATIVE
Urine Glucose: NEGATIVE
Urobilinogen, UA: 0.2 (ref 0.0–1.0)
pH: 6 (ref 5.0–8.0)

## 2021-12-13 LAB — CBC WITH DIFFERENTIAL/PLATELET
Basophils Absolute: 0.1 10*3/uL (ref 0.0–0.1)
Basophils Relative: 1.5 % (ref 0.0–3.0)
Eosinophils Absolute: 0.1 10*3/uL (ref 0.0–0.7)
Eosinophils Relative: 2 % (ref 0.0–5.0)
HCT: 44.3 % (ref 36.0–46.0)
Hemoglobin: 14.3 g/dL (ref 12.0–15.0)
Lymphocytes Relative: 40.1 % (ref 12.0–46.0)
Lymphs Abs: 2.5 10*3/uL (ref 0.7–4.0)
MCHC: 32.3 g/dL (ref 30.0–36.0)
MCV: 89.4 fl (ref 78.0–100.0)
Monocytes Absolute: 0.4 10*3/uL (ref 0.1–1.0)
Monocytes Relative: 6.6 % (ref 3.0–12.0)
Neutro Abs: 3.1 10*3/uL (ref 1.4–7.7)
Neutrophils Relative %: 49.8 % (ref 43.0–77.0)
Platelets: 231 10*3/uL (ref 150.0–400.0)
RBC: 4.96 Mil/uL (ref 3.87–5.11)
RDW: 13.6 % (ref 11.5–15.5)
WBC: 6.1 10*3/uL (ref 4.0–10.5)

## 2021-12-13 LAB — HEMOGLOBIN A1C: Hgb A1c MFr Bld: 6.1 % (ref 4.6–6.5)

## 2021-12-13 LAB — LDL CHOLESTEROL, DIRECT: Direct LDL: 145 mg/dL

## 2021-12-13 LAB — VITAMIN D 25 HYDROXY (VIT D DEFICIENCY, FRACTURES): VITD: 32.67 ng/mL (ref 30.00–100.00)

## 2021-12-13 MED ORDER — SULFAMETHOXAZOLE-TRIMETHOPRIM 800-160 MG PO TABS: 1.0000 | ORAL_TABLET | Freq: Two times a day (BID) | ORAL | 0 refills | Status: AC

## 2021-12-13 MED ORDER — BENAZEPRIL HCL 40 MG PO TABS: ORAL_TABLET | ORAL | 1 refills | Status: DC

## 2021-12-13 NOTE — Progress Notes (Signed)
Established Patient Office Visit     CC/Reason for Visit: Annual preventive exam, subsequent Medicare wellness visit and discuss acute concern  HPI: Julie Dougherty is a 71 y.o. female who is coming in today for the above mentioned reasons. Past Medical History is significant for: Hypertension, hyperlipidemia and GERD.  She has routine eye and dental care, no perceived hearing difficulties.  She is due for COVID, flu, shingles vaccines.  She is overdue for GYN exam.  For the past couple weeks she has been complaining of vaginal itching and increased urinary frequency and dysuria.   Past Medical/Surgical History: Past Medical History:  Diagnosis Date   Arthritis    Cataract    Chronic constipation    Diverticulitis    Diverticulosis    Fatty liver    GERD (gastroesophageal reflux disease)    Hemorrhoids, internal, with bleeding Gr 2 prolapsed 06/30/2011   Found on colonoscopy 2011 and anoscopy May 2013    Hepatitis    "B"    no rx >10 yrs   Hiatal hernia    High cholesterol    Hypertension    Internal hemorrhoids    OSA (obstructive sleep apnea)    does not use CPAP   Pre-diabetes    PUD (peptic ulcer disease)    Rectal ulceration 04/27/2009   Associated reactive/regenerative changes and fibromuscular extensions into lamina propria/Mucosal Prolapse   Sleep apnea    Waiting on CPAP machine    Past Surgical History:  Procedure Laterality Date   ABDOMINAL HYSTERECTOMY     CATARACT EXTRACTION Right 03/2018   COLONOSCOPY  04/27/09    POLYPOID FRAGMENT OF COLONIC MUCOSA WITH SURFACE   EVALUATION UNDER ANESTHESIA WITH HEMORRHOIDECTOMY N/A 03/21/2019   Procedure: OPEN HEMORRHOIDECTOMY;  Surgeon: Armandina Gemma, MD;  Location: [place];  Service: General;  Laterality: N/A;   HEMORRHOID BANDING     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 She has never used smokeless tobacco. She reports that she does not drink alcohol and does not use drugs.  Allergies: Allergies  Allergen Reactions   Ciprofloxacin Hives and Nausea Only   Flagyl [Metronidazole] Hives and Nausea Only    Family History:  Family History  Problem Relation Age of Onset   Diabetes Sister    Asthma Sister    Hyperlipidemia Sister    Hypertension Sister    Colon cancer Father 39   Cancer Father    Arthritis Mother    Hypertension Mother    Depression Sister    Mental illness Sister    Esophageal cancer Neg Hx    Pancreatic cancer Neg Hx    Stomach cancer Neg Hx      Current Outpatient Medications:    Ascorbic Acid (VITAMIN C) 1000 MG tablet, Take 1,000 mg by mouth daily., Disp: , Rfl:    cetirizine (ZYRTEC) 10 MG tablet, Take 10 mg by mouth daily., Disp: , Rfl:    cholecalciferol (VITAMIN D3) 25 MCG (1000 UNIT) tablet, Take 1,000 Units by mouth daily. Unsure of dosage , Disp: , Rfl:  Multiple Vitamins-Minerals (MULTIVITAMIN WITH MINERALS) tablet, Take 1 tablet by mouth daily., Disp: , Rfl:    omega-3 fish oil (MAXEPA) 1000 MG CAPS capsule, Take 1 mg by mouth., Disp: , Rfl:    pantoprazole (PROTONIX) 40 MG tablet, TAKE 1 TABLET BY MOUTH TWICE DAILY ABOUT  30  MINUTES  BEFORE  BREAKFAST  AND  SUPPER, Disp: 180 tablet, Rfl: 1   Polyethylene Glycol 3350 (MIRALAX PO), Take by mouth as needed., Disp: , Rfl:    pravastatin (PRAVACHOL) 40 MG tablet, Take 1 tablet (40 mg total) by mouth daily., Disp: 90 tablet, Rfl: 1   sulfamethoxazole-trimethoprim (BACTRIM DS) 800-160 MG tablet, Take 1 tablet by mouth 2 (two) times daily for 7 days., Disp: 14 tablet, Rfl: 0    benazepril (LOTENSIN) 40 MG tablet, TAKE 1 TABLET BY MOUTH ONCE DAILY --PT  NEEDS  PHYSICAL  VISIT, Disp: 90 tablet, Rfl: 1  Review of Systems:  Constitutional: Denies fever, chills, diaphoresis, appetite change and fatigue.  HEENT: Denies photophobia, eye pain, redness, hearing loss, ear pain, congestion, sore throat, rhinorrhea, sneezing, mouth sores, trouble swallowing, neck pain, neck stiffness and tinnitus.   Respiratory: Denies SOB, DOE, cough, chest tightness,  and wheezing.   Cardiovascular: Denies chest pain, palpitations and leg swelling.  Gastrointestinal: Denies nausea, vomiting, abdominal pain, diarrhea, constipation, blood in stool and abdominal distention.  Genitourinary: Positive for dysuria, urgency, frequency. Endocrine: Denies: hot or cold intolerance, sweats, changes in hair or nails, polyuria, polydipsia. Musculoskeletal: Denies myalgias, back pain, joint swelling, arthralgias and gait problem.  Skin: Denies pallor, rash and wound.  Neurological: Denies dizziness, seizures, syncope, weakness, light-headedness, numbness and headaches.  Hematological: Denies adenopathy. Easy bruising, personal or family bleeding history  Psychiatric/Behavioral: Denies suicidal ideation, mood changes, confusion, nervousness, sleep disturbance and agitation    Physical Exam: Vitals:   12/13/21 1403  BP: 120/84  Pulse: 76  Temp: (!) 97.4 F (36.3 C)  TempSrc: Oral  SpO2: 99%  Weight: 208 lb 1.6 oz (94.4 kg)  Height: '5\' 1"'$  (1.549 m)    Body mass index is 39.32 kg/m.   Constitutional: NAD, calm, comfortable Eyes: PERRL, lids and conjunctivae normal ENMT: Mucous membranes are moist. Posterior pharynx clear of any exudate or lesions. Normal dentition. Tympanic membrane is pearly white, no erythema or bulging. Neck: normal, supple, no masses, no thyromegaly Respiratory: clear to auscultation bilaterally, no wheezing, no crackles. Normal respiratory effort. No accessory muscle use.   Cardiovascular: Regular rate and rhythm, no murmurs / rubs / gallops. No extremity edema. 2+ pedal pulses. No carotid bruits.  Abdomen: no tenderness, no masses palpated. No hepatosplenomegaly. Bowel sounds positive.  Musculoskeletal: no clubbing / cyanosis. No joint deformity upper and lower extremities. Good ROM, no contractures. Normal muscle tone.  Skin: no rashes, lesions, ulcers. No induration Neurologic: CN 2-12 grossly intact. Sensation intact, DTR normal. Strength 5/5 in all 4.  Psychiatric: Normal judgment and insight. Alert and oriented x 3. Normal mood.   Subsequent Medicare wellness visit   1. Risk factors, based on past  M,S,F -cardiovascular disease risk factors include hypertension, hyperlipidemia, obesity, age   38.  Physical activities: Very sedentary   3.  Depression/mood: Mood is stable   4.  Hearing: No perceived issues   5.  ADL's: Independent in all ADLs   6.  Fall risk: Low fall risk   7.  Home safety: No problems identified   8.  Height weight, and visual acuity: height and weight as above, vision:  Vision  Screening   Right eye Left eye Both eyes  Without correction     With correction '20/20 20/20 20/20 '$     9.  Counseling: We have discussed healthy lifestyle changes in detail as well as updating all age-appropriate immunizations   10. Lab orders based on risk factors: Laboratory update will be reviewed   11. Referral : GYN   12. Care plan: Follow-up in 6 to 12 months   13. Cognitive assessment: No cognitive impairment   14. Screening: Patient provided with a written and personalized 5-10 year screening schedule in the AVS. yes   15. Provider List Update: PCP only  16. Advance Directives: Full code   17. Opioids: Patient is not on any opioid prescriptions and has no risk factors for a substance use disorder.   [facility] Office Visit from 12/13/2021 in [place] at [place]  PHQ-9 Total Score 0          06/18/2017     3:33 PM 06/04/2019   10:29 AM 09/18/2019   10:48 AM 06/21/2020    2:25 PM 12/13/2021    2:07 PM  [place] in the past year? No 0 0 0 0  Was there an injury with Fall?   0  0  Fall Risk Category Calculator   0  0  Fall Risk Category   Low  Low  Patient Fall Risk Level   Low fall risk  Low fall risk  Patient at Risk for Falls Due to     No Fall Risks  Fall risk Follow up   Falls prevention discussed  Falls evaluation completed      Impression and Plan:  Encounter for preventive health examination - Plan: CBC with Differential/Platelet, Comprehensive metabolic panel, Hemoglobin A1c, VITAMIN D 25 Hydroxy (Vit-D Deficiency, Fractures)  Essential hypertension - Plan: benazepril (LOTENSIN) 40 MG tablet  Needs flu shot - Plan: Flu Vaccine QUAD High Dose(Fluad)  Acute cystitis without hematuria - Plan: POCT urinalysis dipstick, Urinalysis, Urine Culture, sulfamethoxazole-trimethoprim (BACTRIM DS) 800-160 MG tablet  Screening for cervical cancer - Plan: Ambulatory referral to Obstetrics / Gynecology  Elevated cholesterol - Plan: Lipid panel  -Recommend routine eye and dental care. -Immunizations: Flu vaccine in office today, advised to update COVID and shingles at pharmacy -Healthy lifestyle discussed in detail. -Labs to be updated today. -Colon cancer screening: 10/2019 -Breast cancer screening: 03/2021 -Cervical cancer screening: Overdue, GYN referral placed -Lung cancer screening: Not applicable -Prostate cancer screening: Not applicable -DEXA: 08/2503  -In office urine dipstick positive for leukocytes.  I will treat with Bactrim for 7 days, UA and culture will be sent.      Lelon Frohlich, MD [facility]

## 2021-12-14 ENCOUNTER — Encounter: Payer: Self-pay | Admitting: Internal Medicine

## 2021-12-14 ENCOUNTER — Other Ambulatory Visit: Payer: Self-pay | Admitting: *Deleted

## 2021-12-14 DIAGNOSIS — R7302 Impaired glucose tolerance (oral): Secondary | ICD-10-CM | POA: Insufficient documentation

## 2021-12-14 DIAGNOSIS — R739 Hyperglycemia, unspecified: Secondary | ICD-10-CM

## 2021-12-14 LAB — URINE CULTURE
MICRO NUMBER:: 14186819
SPECIMEN QUALITY:: ADEQUATE

## 2021-12-14 MED ORDER — ATORVASTATIN CALCIUM 20 MG PO TABS: 20.0000 mg | ORAL_TABLET | Freq: Every day | ORAL | 3 refills | Status: DC

## 2022-02-03 ENCOUNTER — Ambulatory Visit (INDEPENDENT_AMBULATORY_CARE_PROVIDER_SITE_OTHER): Payer: 59

## 2022-02-03 ENCOUNTER — Ambulatory Visit
Admission: EM | Admit: 2022-02-03 | Discharge: 2022-02-03 | Disposition: A | Discharge status: Home / Self Care | Payer: 59 | Attending: Urgent Care | Admitting: Urgent Care

## 2022-02-03 DIAGNOSIS — R0789 Other chest pain: Secondary | ICD-10-CM | POA: Diagnosis not present

## 2022-02-03 DIAGNOSIS — J019 Acute sinusitis, unspecified: Secondary | ICD-10-CM | POA: Diagnosis not present

## 2022-02-03 DIAGNOSIS — J4530 Mild persistent asthma, uncomplicated: Secondary | ICD-10-CM

## 2022-02-03 DIAGNOSIS — R059 Cough, unspecified: Secondary | ICD-10-CM

## 2022-02-03 MED ORDER — PROMETHAZINE-DM 6.25-15 MG/5ML PO SYRP: 5.0000 mL | ORAL_SOLUTION | Freq: Three times a day (TID) | ORAL | 0 refills | Status: DC | PRN

## 2022-02-03 MED ORDER — PREDNISONE 20 MG PO TABS: ORAL_TABLET | ORAL | 0 refills | Status: DC

## 2022-02-03 MED ORDER — ALBUTEROL SULFATE HFA 108 (90 BASE) MCG/ACT IN AERS: 1.0000 | INHALATION_SPRAY | Freq: Four times a day (QID) | RESPIRATORY_TRACT | 0 refills | Status: AC | PRN

## 2022-02-03 MED ORDER — AMOXICILLIN-POT CLAVULANATE 875-125 MG PO TABS: 1.0000 | ORAL_TABLET | Freq: Two times a day (BID) | ORAL | 0 refills | Status: AC

## 2022-02-03 MED ORDER — BENZONATATE 100 MG PO CAPS: 100.0000 mg | ORAL_CAPSULE | Freq: Three times a day (TID) | ORAL | 0 refills | Status: AC | PRN

## 2022-02-03 NOTE — ED Provider Notes (Signed)
[facility]  Note:  This document was prepared using Systems analyst and may include unintentional dictation errors.  MRN: [id number] DOB: [birth year]  Subjective:   Julie Dougherty is a 72 y.o. female presenting for 1 week history of productive cough, chest tightness, nasal congestion, throat pain, painful swallowing. Has had shob with her coughing, chest tightness. No fever, wheezing. Has a history of reactive airway disease, needs refill of her albuterol inhaler.  No history of diabetes.  No current facility-administered medications for this encounter.  Current Outpatient Medications:    Ascorbic Acid (VITAMIN C) 1000 MG tablet, Take 1,000 mg by mouth daily., Disp: , Rfl:    atorvastatin (LIPITOR) 20 MG tablet, Take 1 tablet (20 mg total) by mouth daily., Disp: 90 tablet, Rfl: 3   benazepril (LOTENSIN) 40 MG tablet, TAKE 1 TABLET BY MOUTH ONCE DAILY --PT  NEEDS  PHYSICAL  VISIT, Disp: 90 tablet, Rfl: 1   cetirizine (ZYRTEC) 10 MG tablet, Take 10 mg by mouth daily., Disp: , Rfl:    cholecalciferol (VITAMIN D3) 25 MCG (1000 UNIT) tablet, Take 1,000 Units by mouth daily. Unsure of dosage , Disp: , Rfl:    Multiple Vitamins-Minerals (MULTIVITAMIN WITH MINERALS) tablet, Take 1 tablet by mouth daily., Disp: , Rfl:    omega-3 fish oil (MAXEPA) 1000 MG CAPS capsule, Take 1 mg by mouth., Disp: , Rfl:    pantoprazole (PROTONIX) 40 MG tablet, TAKE 1 TABLET BY MOUTH TWICE DAILY ABOUT  30  MINUTES  BEFORE  BREAKFAST  AND  SUPPER, Disp: 180 tablet, Rfl: 1   Polyethylene Glycol 3350 (MIRALAX PO), Take by mouth as needed., Disp: , Rfl:    pravastatin (PRAVACHOL) 40 MG tablet, Take 1 tablet (40 mg total) by mouth daily., Disp: 90 tablet, Rfl: 1   Allergies  Allergen Reactions   Ciprofloxacin Hives and Nausea Only   Flagyl [Metronidazole] Hives and Nausea Only    Past Medical History:  Diagnosis Date   Arthritis    Cataract    Chronic  constipation    Diverticulitis    Diverticulosis    Fatty liver    GERD (gastroesophageal reflux disease)    Hemorrhoids, internal, with bleeding Gr 2 prolapsed 06/30/2011   Found on colonoscopy 2011 and anoscopy May 2013    Hepatitis    "B"    no rx >10 yrs   Hiatal hernia    High cholesterol    Hypertension    Internal hemorrhoids    OSA (obstructive sleep apnea)    does not use CPAP   Pre-diabetes    PUD (peptic ulcer disease)    Rectal ulceration 04/27/2009   Associated reactive/regenerative changes and fibromuscular extensions into lamina propria/Mucosal Prolapse   Sleep apnea    Waiting on CPAP machine     Past Surgical History:  Procedure Laterality Date   ABDOMINAL HYSTERECTOMY     CATARACT EXTRACTION Right 03/2018   COLONOSCOPY  04/27/09    POLYPOID FRAGMENT OF COLONIC MUCOSA WITH SURFACE   EVALUATION UNDER ANESTHESIA WITH HEMORRHOIDECTOMY N/A 03/21/2019   Procedure: OPEN HEMORRHOIDECTOMY;  Surgeon: Armandina Gemma, MD;  Location: [place];  Service: General;  Laterality: N/A;   HEMORRHOID BANDING     TOTAL KNEE ARTHROPLASTY Left 08/29/2016   Procedure: LEFT TOTAL KNEE ARTHROPLASTY;  Surgeon: Mcarthur Rossetti, MD;  Location: [place];  Service: Orthopedics;  Laterality: Left;   TOTAL KNEE ARTHROPLASTY Right 03/06/2017   Procedure: RIGHT TOTAL KNEE  ARTHROPLASTY;  Surgeon: Mcarthur Rossetti, MD;  Location: [place];  Service: Orthopedics;  Laterality: Right;   TUBAL LIGATION      Family History  Problem Relation Age of Onset   Diabetes Sister    Asthma Sister    Hyperlipidemia Sister    Hypertension Sister    Colon cancer Father 61   Cancer Father    Arthritis Mother    Hypertension Mother    Depression Sister    Mental illness Sister    Esophageal cancer Neg Hx    Pancreatic cancer Neg Hx    Stomach cancer Neg Hx     Social History   Tobacco Use   Smoking status: Never   Smokeless tobacco: Never   Tobacco comments:    "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    Appearance: Normal appearance. She is well-developed and normal weight. She is not ill-appearing, toxic-appearing or diaphoretic.  HENT:     Head: Normocephalic and atraumatic.     Right Ear: Tympanic membrane, ear canal and external ear normal. No drainage or tenderness. No middle ear effusion. There is no impacted cerumen. Tympanic membrane is not erythematous or bulging.     Left Ear: Tympanic membrane, ear canal and external ear normal. No drainage or tenderness.  No middle ear effusion. There is no impacted cerumen. Tympanic membrane is not erythematous or bulging.     Nose: Congestion present. No rhinorrhea.     Mouth/Throat:     Mouth: Mucous membranes are moist. No oral lesions.     Pharynx: Posterior oropharyngeal erythema (with associated postnasal drainage overlying pharynx) present. No pharyngeal swelling, oropharyngeal exudate or uvula swelling.     Tonsils: No tonsillar exudate or tonsillar abscesses.  Eyes:     General: No scleral icterus.       Right eye: No discharge.        Left eye: No discharge.     Extraocular Movements: Extraocular movements intact.     Right eye: Normal extraocular motion.     Left eye: Normal extraocular motion.     Conjunctiva/sclera: Conjunctivae normal.  Cardiovascular:     Rate and Rhythm: Normal rate and regular rhythm.     Heart sounds: Normal heart sounds. No murmur heard.    No friction rub. No gallop.  Pulmonary:     Effort: Pulmonary effort is normal. No respiratory distress.     Breath sounds: No stridor. No wheezing, rhonchi or rales.  Chest:     Chest wall: No tenderness.  Musculoskeletal:     Cervical back:  Normal range of motion and neck supple.  Lymphadenopathy:     Cervical: No cervical adenopathy.  Skin:    General: Skin is warm and dry.  Neurological:     General: No focal deficit present.     Mental Status: She is alert and oriented to person, place, and time.  Psychiatric:        Mood and Affect: Mood normal.        Behavior: Behavior normal.  Assessment and Plan :   PDMP not reviewed this encounter.  1. Acute sinusitis, recurrence not specified, unspecified location   2. Mild persistent reactive airway disease without complication     X-ray over-read was pending at time of discharge, recommended follow up with only abnormal results. Otherwise will not call for negative over-read. Patient was in agreement.  Will update treatment plan as necessary.  Will start empiric treatment for sinusitis with Augmentin.  In the context of her respiratory symptoms, reactive airway disease, recommended an oral prednisone course.  Refilled her albuterol inhaler.  Recommended supportive care otherwise. Counseled patient on potential for adverse effects with medications prescribed/recommended today, ER and return-to-clinic precautions discussed, patient verbalized understanding.    Jaynee Eagles, [place] 02/03/22 1744

## 2022-02-03 NOTE — ED Triage Notes (Signed)
Per pt's son/interpreter-pt c/o prod cough x 1 week-denies fever-NAD-steady gait

## 2022-03-09 ENCOUNTER — Other Ambulatory Visit: Payer: Self-pay | Admitting: Physician Assistant

## 2022-03-09 DIAGNOSIS — K219 Gastro-esophageal reflux disease without esophagitis: Secondary | ICD-10-CM

## 2022-03-22 ENCOUNTER — Encounter: Payer: Self-pay | Admitting: Internal Medicine

## 2022-03-22 DIAGNOSIS — K219 Gastro-esophageal reflux disease without esophagitis: Secondary | ICD-10-CM

## 2022-03-22 MED ORDER — PANTOPRAZOLE SODIUM 40 MG PO TBEC: DELAYED_RELEASE_TABLET | ORAL | 1 refills | Status: DC

## 2022-04-13 DIAGNOSIS — Z1231 Encounter for screening mammogram for malignant neoplasm of breast: Secondary | ICD-10-CM | POA: Diagnosis not present

## 2022-04-13 LAB — HM MAMMOGRAPHY

## 2022-06-12 ENCOUNTER — Other Ambulatory Visit: Payer: Self-pay | Admitting: Internal Medicine

## 2022-06-12 DIAGNOSIS — I10 Essential (primary) hypertension: Secondary | ICD-10-CM

## 2022-09-09 ENCOUNTER — Other Ambulatory Visit: Payer: Self-pay | Admitting: Internal Medicine

## 2022-09-09 DIAGNOSIS — K219 Gastro-esophageal reflux disease without esophagitis: Secondary | ICD-10-CM

## 2022-09-19 DIAGNOSIS — Z961 Presence of intraocular lens: Secondary | ICD-10-CM | POA: Diagnosis not present

## 2022-09-19 DIAGNOSIS — H0102B Squamous blepharitis left eye, upper and lower eyelids: Secondary | ICD-10-CM | POA: Diagnosis not present

## 2022-09-19 DIAGNOSIS — H35341 Macular cyst, hole, or pseudohole, right eye: Secondary | ICD-10-CM | POA: Diagnosis not present

## 2022-09-19 DIAGNOSIS — H26493 Other secondary cataract, bilateral: Secondary | ICD-10-CM | POA: Diagnosis not present

## 2022-09-19 DIAGNOSIS — H0102A Squamous blepharitis right eye, upper and lower eyelids: Secondary | ICD-10-CM | POA: Diagnosis not present

## 2022-09-19 DIAGNOSIS — H1013 Acute atopic conjunctivitis, bilateral: Secondary | ICD-10-CM | POA: Diagnosis not present

## 2022-10-10 DIAGNOSIS — H26491 Other secondary cataract, right eye: Secondary | ICD-10-CM | POA: Diagnosis not present

## 2022-11-27 ENCOUNTER — Encounter: Payer: Self-pay | Admitting: Physician Assistant

## 2022-11-27 ENCOUNTER — Other Ambulatory Visit (INDEPENDENT_AMBULATORY_CARE_PROVIDER_SITE_OTHER): Payer: 59

## 2022-11-27 ENCOUNTER — Ambulatory Visit (INDEPENDENT_AMBULATORY_CARE_PROVIDER_SITE_OTHER): Payer: 59 | Admitting: Physician Assistant

## 2022-11-27 DIAGNOSIS — S66911A Strain of unspecified muscle, fascia and tendon at wrist and hand level, right hand, initial encounter: Secondary | ICD-10-CM | POA: Diagnosis not present

## 2022-11-27 DIAGNOSIS — M25531 Pain in right wrist: Secondary | ICD-10-CM

## 2022-11-27 DIAGNOSIS — M65341 Trigger finger, right ring finger: Secondary | ICD-10-CM

## 2022-11-27 NOTE — Progress Notes (Addendum)
Office Visit Note   Patient: Julie Dougherty           Date of Birth: 26-Sep-1950           MRN: [id number] Visit Date: 11/27/2022              Requested by: Philip Aspen, Limmie Patricia, MD [address] [place],  [place] [address] PCP: Philip Aspen, Limmie Patricia, MD   Assessment & Plan: Visit Diagnoses:  1. Pain in right wrist   2. Wrist strain, right, initial encounter     Plan: Placed her in a removable wrist splint.  She will wear this at all times except for hand hygiene.  She will apply Voltaren gel 2 g 4 times daily to the dorsal wrist area of maximal tenderness.  Will see her back in just 2 weeks to see how she is doing overall.  In regards to the right ring finger trigger finger she tolerated the injection well.  Follow-Up Instructions: Return in about 2 weeks (around 12/11/2022).   Orders:  Orders Placed This Encounter  Procedures   Hand/UE Inj   XR Wrist Complete Right   No orders of the defined types were placed in this encounter.     Procedures: Hand/UE Inj: R ring A1 for trigger finger on 01/08/2023 11:15 AM Medications: 0.5 mL lidocaine 1 %; 20 mg methylPREDNISolone acetate 40 MG/ML      Clinical Data: No additional findings.   Subjective: Chief Complaint  Patient presents with   Right Wrist - Pain    HPI Patient 72 year old female comes in today with right wrist pain.  She states that she pressed down on a box and felt a pop in her wrist 2 weeks ago.  Pain is improving.  She has been applying IcyHot.  She does note swelling and points to the dorsal aspect of her wrist.  Her daughter translates for her and she does speak [place].  She has also been taking Tylenol.  States her pain was severe 10 out of 10 pain and is now at its worst 8 out of 10. She also notes that she has a right finger that is getting locked down at times she points to the right ring finger as the source of triggering.  Review of Systems Denies any fevers  chills  Objective: Vital Signs: There were no vitals taken for this visit.  Physical Exam General: Well-developed well-nourished female no acute distress mood and affect appropriate Psych: Alert and oriented x 3 Ortho Exam Right wrist swelling dorsally mid wrist.  No ecchymosis.  Maximal tenderness is over the mid dorsal wrist and up into the third and fourth metacarpals.  She is able to extend her fingers.  She has slight decreased strength with extension of the fingers particularly the third and fourth fingers against resistance compared to the left hand.  She is able to make full fist bilaterally.  Palpable nodule at the right ring finger A1 pulley.  Nodules tender.  No active triggering.  Otherwise no triggering throughout both hands.  Radial pulse is 2+ on the right. Specialty Comments:  No specialty comments available.  Imaging: Right wrist 3 views: No acute fractures acute findings.  Compared to films from 2020 one of the right wrist and hand there is been no interval change.   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   Status post total knee replacement, left 08/29/2016   Baker's cyst of knee, left - suspected 05/30/2016   Diverticulitis of colon 03/17/2016   Hemorrhoids, internal, with bleeding Gr 2 prolapsed 06/30/2011   Diverticulitis 06/06/2011   Rectal bleeding 04/19/2009   ABDOMINAL PAIN, RIGHT LOWER QUADRANT 04/19/2009   Elevated cholesterol 07/11/2006   Essential hypertension 07/11/2006   GERD 07/11/2006   Past Medical History:  Diagnosis Date   Arthritis    Cataract     Chronic constipation    Diverticulitis    Diverticulosis    Fatty liver    GERD (gastroesophageal reflux disease)    Hemorrhoids, internal, with bleeding Gr 2 prolapsed 06/30/2011   Found on colonoscopy 2011 and anoscopy May 2013    Hepatitis    "B"    no rx >10 yrs   Hiatal hernia    High cholesterol    Hypertension    Internal hemorrhoids    OSA (obstructive sleep apnea)    does not use CPAP   Pre-diabetes    PUD (peptic ulcer disease)    Rectal ulceration 04/27/2009   Associated reactive/regenerative changes and fibromuscular extensions into lamina propria/Mucosal Prolapse   Sleep apnea    Waiting on CPAP machine    Family History  Problem Relation Age of Onset   Diabetes Sister    Asthma Sister    Hyperlipidemia Sister    Hypertension Sister    Colon cancer Father 17   Cancer Father    Arthritis Mother    Hypertension Mother    Depression Sister    Mental illness Sister    Esophageal cancer Neg Hx    Pancreatic cancer Neg Hx    Stomach cancer Neg Hx     Past Surgical History:  Procedure Laterality Date   ABDOMINAL HYSTERECTOMY     CATARACT EXTRACTION Right 03/2018   COLONOSCOPY  04/27/09    POLYPOID FRAGMENT OF COLONIC MUCOSA WITH SURFACE   EVALUATION UNDER ANESTHESIA WITH HEMORRHOIDECTOMY N/A 03/21/2019   Procedure: OPEN HEMORRHOIDECTOMY;  Surgeon: Darnell Level, MD;  Location: [facility];  Service: General;  Laterality: N/A;   HEMORRHOID BANDING     TOTAL KNEE ARTHROPLASTY Left 08/29/2016   Procedure: LEFT TOTAL KNEE ARTHROPLASTY;  Surgeon: Kathryne Hitch, MD;  Location: [facility];  Service: Orthopedics;  Laterality: Left;   TOTAL KNEE ARTHROPLASTY Right 03/06/2017   Procedure: RIGHT TOTAL KNEE ARTHROPLASTY;  Surgeon: Kathryne Hitch, MD;  Location: [facility];  Service: Orthopedics;  Laterality: Right;   TUBAL LIGATION     Social History   Occupational History   Not on file  Tobacco Use   Smoking status: Never   Smokeless tobacco:  Never   Tobacco comments:    "when she was young"  Vaping Use   Vaping status: Never Used  Substance and Sexual Activity   Alcohol use: No   Drug use: No   Sexual activity: Not on file

## 2022-12-11 ENCOUNTER — Ambulatory Visit: Payer: 59 | Admitting: Physician Assistant

## 2022-12-13 ENCOUNTER — Other Ambulatory Visit: Payer: Self-pay | Admitting: Internal Medicine

## 2022-12-13 DIAGNOSIS — K219 Gastro-esophageal reflux disease without esophagitis: Secondary | ICD-10-CM

## 2022-12-13 DIAGNOSIS — I10 Essential (primary) hypertension: Secondary | ICD-10-CM

## 2022-12-14 ENCOUNTER — Encounter: Payer: Self-pay | Admitting: Internal Medicine

## 2022-12-14 ENCOUNTER — Ambulatory Visit: Payer: 59 | Admitting: Internal Medicine

## 2022-12-14 ENCOUNTER — Other Ambulatory Visit: Payer: Self-pay | Admitting: Internal Medicine

## 2022-12-14 ENCOUNTER — Other Ambulatory Visit: Payer: Self-pay | Admitting: *Deleted

## 2022-12-14 ENCOUNTER — Telehealth: Payer: Self-pay | Admitting: *Deleted

## 2022-12-14 VITALS — BP 110/80 | HR 75 | Temp 98.6°F | Ht 60.75 in | Wt 205.1 lb

## 2022-12-14 DIAGNOSIS — E7800 Pure hypercholesterolemia, unspecified: Secondary | ICD-10-CM

## 2022-12-14 DIAGNOSIS — E559 Vitamin D deficiency, unspecified: Secondary | ICD-10-CM

## 2022-12-14 DIAGNOSIS — Z23 Encounter for immunization: Secondary | ICD-10-CM

## 2022-12-14 DIAGNOSIS — Z780 Asymptomatic menopausal state: Secondary | ICD-10-CM

## 2022-12-14 DIAGNOSIS — Z6839 Body mass index (BMI) 39.0-39.9, adult: Secondary | ICD-10-CM

## 2022-12-14 DIAGNOSIS — R7302 Impaired glucose tolerance (oral): Secondary | ICD-10-CM | POA: Diagnosis not present

## 2022-12-14 DIAGNOSIS — I10 Essential (primary) hypertension: Secondary | ICD-10-CM | POA: Diagnosis not present

## 2022-12-14 DIAGNOSIS — R739 Hyperglycemia, unspecified: Secondary | ICD-10-CM

## 2022-12-14 DIAGNOSIS — K219 Gastro-esophageal reflux disease without esophagitis: Secondary | ICD-10-CM | POA: Diagnosis not present

## 2022-12-14 DIAGNOSIS — Z124 Encounter for screening for malignant neoplasm of cervix: Secondary | ICD-10-CM

## 2022-12-14 DIAGNOSIS — Z0000 Encounter for general adult medical examination without abnormal findings: Secondary | ICD-10-CM

## 2022-12-14 LAB — COMPREHENSIVE METABOLIC PANEL
ALT: 19 U/L (ref 0–35)
AST: 18 U/L (ref 0–37)
Albumin: 4.3 g/dL (ref 3.5–5.2)
Alkaline Phosphatase: 69 U/L (ref 39–117)
BUN: 12 mg/dL (ref 6–23)
CO2: 26 meq/L (ref 19–32)
Calcium: 9.5 mg/dL (ref 8.4–10.5)
Chloride: 104 meq/L (ref 96–112)
Creatinine, Ser: 0.91 mg/dL (ref 0.40–1.20)
GFR: 63.38 mL/min (ref >60.00)
Glucose, Bld: 97 mg/dL (ref 70–99)
Potassium: 4.1 meq/L (ref 3.5–5.1)
Sodium: 139 meq/L (ref 135–145)
Total Bilirubin: 0.6 mg/dL (ref 0.2–1.2)
Total Protein: 7.6 g/dL (ref 6.0–8.3)

## 2022-12-14 LAB — LIPID PANEL
Cholesterol: 152 mg/dL (ref 0–200)
HDL: 44.9 mg/dL (ref >39.00)
LDL Cholesterol: 76 mg/dL (ref 0–99)
NonHDL: 107.37
Total CHOL/HDL Ratio: 3
Triglycerides: 155 mg/dL — ABNORMAL HIGH (ref 0.0–149.0)
VLDL: 31 mg/dL (ref 0.0–40.0)

## 2022-12-14 LAB — CBC WITH DIFFERENTIAL/PLATELET
Basophils Absolute: 0.1 10*3/uL (ref 0.0–0.1)
Basophils Relative: 1.1 % (ref 0.0–3.0)
Eosinophils Absolute: 0.1 10*3/uL (ref 0.0–0.7)
Eosinophils Relative: 1.4 % (ref 0.0–5.0)
HCT: 42.4 % (ref 36.0–46.0)
Hemoglobin: 13.8 g/dL (ref 12.0–15.0)
Lymphocytes Relative: 37.9 % (ref 12.0–46.0)
Lymphs Abs: 2.4 10*3/uL (ref 0.7–4.0)
MCHC: 32.6 g/dL (ref 30.0–36.0)
MCV: 89.1 fL (ref 78.0–100.0)
Monocytes Absolute: 0.4 10*3/uL (ref 0.1–1.0)
Monocytes Relative: 7.1 % (ref 3.0–12.0)
Neutro Abs: 3.3 10*3/uL (ref 1.4–7.7)
Neutrophils Relative %: 52.5 % (ref 43.0–77.0)
Platelets: 231 10*3/uL (ref 150.0–400.0)
RBC: 4.76 Mil/uL (ref 3.87–5.11)
RDW: 13.1 % (ref 11.5–15.5)
WBC: 6.2 10*3/uL (ref 4.0–10.5)

## 2022-12-14 LAB — HEMOGLOBIN A1C: Hgb A1c MFr Bld: 6.3 % (ref 4.6–6.5)

## 2022-12-14 LAB — VITAMIN B12: Vitamin B-12: 323 pg/mL (ref 211–911)

## 2022-12-14 LAB — TSH: TSH: 2.32 u[IU]/mL (ref 0.35–5.50)

## 2022-12-14 LAB — VITAMIN D 25 HYDROXY (VIT D DEFICIENCY, FRACTURES): VITD: 29.08 ng/mL — ABNORMAL LOW (ref 30.00–100.00)

## 2022-12-14 MED ORDER — PANTOPRAZOLE SODIUM 40 MG PO TBEC: DELAYED_RELEASE_TABLET | ORAL | 1 refills | Status: DC

## 2022-12-14 MED ORDER — BENAZEPRIL HCL 40 MG PO TABS: ORAL_TABLET | ORAL | 1 refills | Status: DC

## 2022-12-14 MED ORDER — ATORVASTATIN CALCIUM 20 MG PO TABS: 20.0000 mg | ORAL_TABLET | Freq: Every day | ORAL | 1 refills | Status: DC

## 2022-12-14 MED ORDER — VITAMIN D (ERGOCALCIFEROL) 1.25 MG (50000 UNIT) PO CAPS: 50000.0000 [IU] | ORAL_CAPSULE | ORAL | 0 refills | Status: AC

## 2022-12-14 MED ORDER — PRAVASTATIN SODIUM 40 MG PO TABS: 40.0000 mg | ORAL_TABLET | Freq: Every day | ORAL | 1 refills | Status: DC

## 2022-12-14 NOTE — Progress Notes (Signed)
Established Patient Office Visit     CC/Reason for Visit: Annual preventive exam and subsequent Medicare wellness visit  HPI: Julie Dougherty is a 72 y.o. female who is coming in today for the above mentioned reasons. Past Medical History is significant for: Hypertension, hyperlipidemia, GERD.  She feels well without acute concerns or complaints.  She is due for flu, COVID, shingles vaccines.  She is due for a DEXA scan and cervical cancer screening.   Past Medical/Surgical History: Past Medical History:  Diagnosis Date   Arthritis    Cataract    Chronic constipation    Diverticulitis    Diverticulosis    Fatty liver    GERD (gastroesophageal reflux disease)    Hemorrhoids, internal, with bleeding Gr 2 prolapsed 06/30/2011   Found on colonoscopy 2011 and anoscopy May 2013    Hepatitis    "B"    no rx >10 yrs   Hiatal hernia    High cholesterol    Hypertension    Internal hemorrhoids    OSA (obstructive sleep apnea)    does not use CPAP   Pre-diabetes    PUD (peptic ulcer disease)    Rectal ulceration 04/27/2009   Associated reactive/regenerative changes and fibromuscular extensions into lamina propria/Mucosal Prolapse   Sleep apnea    Waiting on CPAP machine    Past Surgical History:  Procedure Laterality Date   ABDOMINAL HYSTERECTOMY     CATARACT EXTRACTION Right 03/2018   COLONOSCOPY  04/27/09    POLYPOID FRAGMENT OF COLONIC MUCOSA WITH SURFACE   EVALUATION UNDER ANESTHESIA WITH HEMORRHOIDECTOMY N/A 03/21/2019   Procedure: OPEN HEMORRHOIDECTOMY;  Surgeon: Darnell Level, MD;  Location: [facility];  Service: General;  Laterality: N/A;   HEMORRHOID BANDING     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 She has never used smokeless tobacco. She reports that she does not drink alcohol and does not use drugs.  Allergies: Allergies  Allergen Reactions   Ciprofloxacin Hives and Nausea Only   Flagyl [Metronidazole] Hives and Nausea Only    Family History:  Family History  Problem Relation Age of Onset   Diabetes Sister    Asthma Sister    Hyperlipidemia Sister    Hypertension Sister    Colon cancer Father 41   Cancer Father    Arthritis Mother    Hypertension Mother    Depression Sister    Mental illness Sister    Esophageal cancer Neg Hx    Pancreatic cancer Neg Hx    Stomach cancer Neg Hx      Current Outpatient Medications:    albuterol (VENTOLIN HFA) 108 (90 Base) MCG/ACT inhaler, Inhale 1-2 puffs into the lungs every 6 (six) hours as needed for wheezing or shortness of breath., Disp: 18 g, Rfl: 0   amoxicillin-clavulanate (AUGMENTIN) 875-125 MG tablet, Take 1 tablet by mouth 2 (two) times daily., Disp: 14 tablet, Rfl: 0   Ascorbic Acid (VITAMIN C) 1000 MG tablet, Take 1,000 mg by mouth daily., Disp: , Rfl:    benzonatate (TESSALON) 100 MG capsule, Take  1 capsule (100 mg total) by mouth 3 (three) times daily as needed for cough., Disp: 30 capsule, Rfl: 0   cetirizine (ZYRTEC) 10 MG tablet, Take 10 mg by mouth daily., Disp: , Rfl:    cholecalciferol (VITAMIN D3) 25 MCG (1000 UNIT) tablet, Take 1,000 Units by mouth daily. Unsure of dosage , Disp: , Rfl:    Multiple Vitamins-Minerals (MULTIVITAMIN WITH MINERALS) tablet, Take 1 tablet by mouth daily., Disp: , Rfl:    omega-3 fish oil (MAXEPA) 1000 MG CAPS capsule, Take 1 mg by mouth., Disp: , Rfl:    Polyethylene Glycol 3350 (MIRALAX PO), Take by mouth as needed., Disp: , Rfl:    atorvastatin (LIPITOR) 20 MG tablet, Take 1 tablet (20 mg total) by mouth daily., Disp:  90 tablet, Rfl: 1   benazepril (LOTENSIN) 40 MG tablet, TAKE 1 TABLET BY MOUTH ONCE DAILY ., Disp: 90 tablet, Rfl: 1   pantoprazole (PROTONIX) 40 MG tablet, TAKE 1 TABLET BY MOUTH TWICE DAILY ABOUT  30  MINUTES  BEFORE  BREAKFAST  AND  SUPPER, Disp: 180 tablet, Rfl: 1   pravastatin (PRAVACHOL) 40 MG tablet, Take 1 tablet (40 mg total) by mouth daily., Disp: 90 tablet, Rfl: 1  Review of Systems:  Negative unless indicated in HPI.   Physical Exam: Vitals:   12/14/22 0817  BP: 110/80  Pulse: 75  Temp: 98.6 F (37 C)  TempSrc: Oral  SpO2: 98%  Weight: 205 lb 1.6 oz (93 kg)  Height: 5' 0.75" (1.543 m)    Body mass index is 39.07 kg/m.   Physical Exam Vitals reviewed.  Constitutional:      General: She is not in acute distress.    Appearance: Normal appearance. She is obese. She is not ill-appearing, toxic-appearing or diaphoretic.  HENT:     Head: Normocephalic.     Right Ear: Tympanic membrane, ear canal and external ear normal. There is no impacted cerumen.     Left Ear: Tympanic membrane, ear canal and external ear normal. There is no impacted cerumen.     Nose: Nose normal.     Mouth/Throat:     Mouth: Mucous membranes are moist.     Pharynx: Oropharynx is clear. No oropharyngeal exudate or posterior oropharyngeal erythema.  Eyes:     General: No scleral icterus.       Right eye: No discharge.        Left eye: No discharge.     Conjunctiva/sclera: Conjunctivae normal.     Pupils: Pupils are equal, round, and reactive to light.  Neck:     Vascular: No carotid bruit.  Cardiovascular:     Rate and Rhythm: Normal rate and regular rhythm.     Pulses: Normal pulses.     Heart sounds: Normal heart sounds.  Pulmonary:     Effort: Pulmonary effort is normal. No respiratory distress.     Breath sounds: Normal breath sounds.  Abdominal:     General: Abdomen is flat. Bowel sounds are normal.     Palpations: Abdomen is soft.  Musculoskeletal:        General: Normal range  of motion.     Cervical back: Normal range of motion.  Skin:    General: Skin is warm and dry.  Neurological:     General: No focal deficit present.     Mental Status: She is alert and oriented to person, place, and time. Mental status is at baseline.  Psychiatric:  Mood and Affect: Mood normal.        Behavior: Behavior normal.        Thought Content: Thought content normal.        Judgment: Judgment normal.      Subsequent Medicare wellness visit   1. Risk factors, based on past  M,S,F - Cardiac Risk Factors include: hypertension   2.  Physical activities: Dietary issues and exercise activities discussed:      3.  Depression/mood:  Flowsheet Row Office Visit from 12/14/2022 in [facility] Total Score 0        4.  ADL's:    12/14/2022    8:07 AM  In your present state of health, do you have any difficulty performing the following activities:  Hearing? 0  Vision? 0  Difficulty concentrating or making decisions? 0  Walking or climbing stairs? 0  Dressing or bathing? 0  Doing errands, shopping? 0  Preparing Food and eating ? N  Using the Toilet? N  In the past six months, have you accidently leaked urine? N  Do you have problems with loss of bowel control? N  Managing your Medications? N  Managing your Finances? N  Housekeeping or managing your Housekeeping? N     5.  Fall risk:     09/18/2019   10:48 AM 06/21/2020    2:25 PM 12/13/2021    2:07 PM 02/03/2022    5:14 PM 12/14/2022    8:26 AM  Fall Risk  Falls in the past year? 0 0 0  1  Was there an injury with Fall? 0  0  0  Fall Risk Category Calculator 0  0  1  Fall Risk Category (Retired) Low  Low    (RETIRED) Patient Fall Risk Level Low fall risk  Low fall risk Low fall risk   Patient at Risk for Falls Due to   No Fall Risks    Fall risk Follow up Falls prevention discussed  Falls evaluation completed       6.  Home safety: No problems identified   7.   Height weight, and visual acuity: height and weight as above, vision/hearing: Vision Screening   Right eye Left eye Both eyes  Without correction     With correction 20/20 20/20 20/20      8.  Counseling: Counseling given: Not Answered Tobacco comments: "when she was young"    9. Lab orders based on risk factors: Laboratory update will be reviewed   10. Cognitive assessment:        12/14/2022    8:09 AM  6CIT Screen  What Year? 0 points  What month? 0 points  What time? 0 points  Count back from 20 0 points  Months in reverse 2 points  Repeat phrase 0 points  Total Score 2 points     11. Screening: Patient provided with a written and personalized 5-10 year screening schedule in the AVS. Health Maintenance  Topic Date Due   Zoster (Shingles) Vaccine (1 of 2) Never done   COVID-19 Vaccine (4 - 2023-24 season) 10/01/2022   Medicare Annual Wellness Visit  12/14/2023   Mammogram  04/12/2024   Colon Cancer Screening  10/01/2026   DTaP/Tdap/Td vaccine (2 - Td or Tdap) 06/03/2029   Pneumonia Vaccine  Completed   Flu Shot  Completed   DEXA scan (bone density measurement)  Completed   Hepatitis C Screening  Completed   HPV Vaccine  Aged Out  12. Provider List Update: Patient Care Team    Relationship Specialty Notifications Start End  Philip Aspen, Limmie Patricia, MD PCP - General Internal Medicine  12/07/20   Maurice March, MD (Inactive)  Family Medicine  06/06/11   Iva Boop, MD Consulting Physician Gastroenterology  06/06/11      13. Advance Directives: Does Patient Have a Medical Advance Directive?: No Would patient like information on creating a medical advance directive?: No - Patient declined  14. Opioids: Patient is not on any opioid prescriptions and has no risk factors for a substance use disorder.   15.   Goals      Learn More About My Health     Timeframe:  Long-Range Goal Priority:  Medium Start Date:                             Expected  End Date:                        Follow Up Date 12/14/2023    - repeat what I heard to make sure I understand    Why is this important?   The best way to learn about your health and care is by talking to the doctor and nurse.  They will answer your questions and give you information in the way that you like best.    Notes:      Patient Stated     Would like to lose weight     Track and Manage My Blood Pressure-Hypertension     Timeframe:  Long-Range Goal Priority:  High Start Date: 11/08/2020                             Expected End Date: 11/08/2021                      Follow Up within 90 days   - check blood pressure weekly    Why is this important?   You won't feel high blood pressure, but it can still hurt your blood vessels.  High blood pressure can cause heart or kidney problems. It can also cause a stroke.  Making lifestyle changes like losing a little weight or eating less salt will help.  Checking your blood pressure at home and at different times of the day can help to control blood pressure.  If the doctor prescribes medicine remember to take it the way the doctor ordered.  Call the office if you cannot afford the medicine or if there are questions about it.     Notes:          I have personally reviewed and noted the following in the patient's chart:   Medical and social history Use of alcohol, tobacco or illicit drugs  Current medications and supplements Functional ability and status Nutritional status Physical activity Advanced directives List of other physicians Hospitalizations, surgeries, and ER visits in previous 12 months Vitals Screenings to include cognitive, depression, and falls Referrals and appointments  In addition, I have reviewed and discussed with patient certain preventive protocols, quality metrics, and best practice recommendations. A written personalized care plan for preventive services as well as general preventive health  recommendations were provided to patient.   Impression and Plan:  Medicare annual wellness visit, subsequent  Hyperglycemia  Essential hypertension -     CBC  with Differential/Platelet; Future -     Comprehensive metabolic panel; Future -     Benazepril HCl; TAKE 1 TABLET BY MOUTH ONCE DAILY .  Dispense: 90 tablet; Refill: 1  Elevated cholesterol -     Lipid panel; Future -     Pravastatin Sodium; Take 1 tablet (40 mg total) by mouth daily.  Dispense: 90 tablet; Refill: 1  IGT (impaired glucose tolerance) -     Hemoglobin A1c; Future  Immunization due -     Flu Vaccine Trivalent High Dose (Fluad)  Morbid obesity (HCC) -     TSH; Future -     Vitamin B12; Future -     VITAMIN D 25 Hydroxy (Vit-D Deficiency, Fractures); Future  Postmenopausal estrogen deficiency -     DG Bone Density; Future  Screening for cervical cancer -     Ambulatory referral to Gynecology  Gastroesophageal reflux disease, unspecified whether esophagitis present -     Pantoprazole Sodium; TAKE 1 TABLET BY MOUTH TWICE DAILY ABOUT  30  MINUTES  BEFORE  BREAKFAST  AND  SUPPER  Dispense: 180 tablet; Refill: 1  Other orders -     Atorvastatin Calcium; Take 1 tablet (20 mg total) by mouth daily.  Dispense: 90 tablet; Refill: 1   -Recommend routine eye and dental care. -Healthy lifestyle discussed in detail. -Labs to be updated today. -Prostate cancer screening: N/A Health Maintenance  Topic Date Due   Zoster (Shingles) Vaccine (1 of 2) Never done   COVID-19 Vaccine (4 - 2023-24 season) 10/01/2022   Medicare Annual Wellness Visit  12/14/2023   Mammogram  04/12/2024   Colon Cancer Screening  10/01/2026   DTaP/Tdap/Td vaccine (2 - Td or Tdap) 06/03/2029   Pneumonia Vaccine  Completed   Flu Shot  Completed   DEXA scan (bone density measurement)  Completed   Hepatitis C Screening  Completed   HPV Vaccine  Aged Out     -Flu vaccine in office today.  Advised to update COVID and shingles at  pharmacy. -Referrals for GYN and bone density placed.     Chaya Jan, MD [facility] at [facility]

## 2022-12-14 NOTE — Telephone Encounter (Signed)
Pravastatin discontinued from medication list.

## 2022-12-21 ENCOUNTER — Other Ambulatory Visit (HOSPITAL_BASED_OUTPATIENT_CLINIC_OR_DEPARTMENT_OTHER): Payer: 59

## 2022-12-21 ENCOUNTER — Ambulatory Visit (HOSPITAL_BASED_OUTPATIENT_CLINIC_OR_DEPARTMENT_OTHER)
Admission: RE | Admit: 2022-12-21 | Discharge: 2022-12-21 | Disposition: A | Discharge status: Home / Self Care | Payer: 59 | Source: Ambulatory Visit | Attending: Internal Medicine | Admitting: Internal Medicine

## 2022-12-21 DIAGNOSIS — Z780 Asymptomatic menopausal state: Secondary | ICD-10-CM | POA: Diagnosis not present

## 2022-12-21 DIAGNOSIS — M8588 Other specified disorders of bone density and structure, other site: Secondary | ICD-10-CM | POA: Diagnosis not present

## 2023-01-08 ENCOUNTER — Encounter: Payer: Self-pay | Admitting: Physician Assistant

## 2023-01-08 ENCOUNTER — Ambulatory Visit (INDEPENDENT_AMBULATORY_CARE_PROVIDER_SITE_OTHER): Payer: 59 | Admitting: Physician Assistant

## 2023-01-08 DIAGNOSIS — S66911A Strain of unspecified muscle, fascia and tendon at wrist and hand level, right hand, initial encounter: Secondary | ICD-10-CM

## 2023-01-08 DIAGNOSIS — M25531 Pain in right wrist: Secondary | ICD-10-CM

## 2023-01-08 DIAGNOSIS — M65341 Trigger finger, right ring finger: Secondary | ICD-10-CM

## 2023-01-08 MED ORDER — METHYLPREDNISOLONE ACETATE 40 MG/ML IJ SUSP
20.0000 mg | INTRAMUSCULAR | Status: AC | PRN
  Administered 2023-01-08: 20 mg

## 2023-01-08 MED ORDER — LIDOCAINE HCL 1 % IJ SOLN
0.5000 mL | INTRAMUSCULAR | Status: AC | PRN
  Administered 2023-01-08: .5 mL

## 2023-01-08 NOTE — Progress Notes (Signed)
HPI: [name] returns today follow-up of her right wrist pain.  She states overall the pain is improved.  She does note that the trigger finger on the right hand is much better since the injection.  She is wearing the wrist splint most of the day.  She has 8 out of 10 pain only whenever picking up heavy objects.  She is using Voltaren gel and states this is beneficial.  Pain still dorsal mid wrist.  She presents today with an interpreter.  Review of systems: See HPI otherwise negative  Physical exam: General Well-developed well-nourished female in no acute distress. Bilateral hands no rashes skin lesions ulcerations.  Dorsal mid right wrist area of edema but no ecchymosis.  Likely represents an early ganglion cyst.  Maximal tenderness right wrist dorsally mid wrist only.  Pain in the hands none tender.  She has full dorsiflexion and volar flexion of both wrist.  Dorsiflexion of the right wrist causes some discomfort.  No pain with ulnar or radial deviation either wrist.  There is no triggering throughout both hands of any of the fingers.  Full extension bilateral hands all fingers against resistance reveals 5 out of 5 -5 strength.  Discomfort with extension of the the right ring and long finger against resistance.   Impression: Right wrist injury Right ring finger trigger finger  Plan: Like for her to discontinue the brace.  Should continue to wear it only when lifting heavy objects.  Continue Voltaren gel.  Told her this likely will just take time to resolve.  If she still having pain in the next 3 months she will follow-up with [place].  She most likely will develop ganglion cyst dorsal wrist and this could be aspirated injection if needed.  Questions were encouraged and answered at length.  Follow-up as needed

## 2023-03-01 ENCOUNTER — Ambulatory Visit: Payer: 59 | Admitting: Internal Medicine

## 2023-03-01 ENCOUNTER — Encounter: Payer: Self-pay | Admitting: Internal Medicine

## 2023-03-01 VITALS — BP 112/81 | HR 95 | Temp 97.8°F | Resp 16 | Ht 60.75 in | Wt 208.2 lb

## 2023-03-01 DIAGNOSIS — R6889 Other general symptoms and signs: Secondary | ICD-10-CM | POA: Diagnosis not present

## 2023-03-01 LAB — POCT INFLUENZA A/B
Influenza A, POC: NEGATIVE
Influenza B, POC: NEGATIVE

## 2023-03-01 LAB — POC COVID19 BINAXNOW: SARS Coronavirus 2 Ag: NEGATIVE

## 2023-03-01 NOTE — Addendum Note (Signed)
Addended by: Conrad Millard D on: 03/01/2023 04:22 PM   Modules accepted: Orders

## 2023-03-01 NOTE — Progress Notes (Signed)
Established Patient Office Visit     CC/Reason for Visit: Flulike symptoms  HPI: Julie Dougherty is a 73 y.o. female who is coming in today for the above mentioned reasons.  This morning she woke up with a severe sore throat and has progressed to having significant body aches.  No fever.  No sick contacts, no recent travel.   Past Medical/Surgical History: Past Medical History:  Diagnosis Date   Arthritis    Cataract    Chronic constipation    Diverticulitis    Diverticulosis    Fatty liver    GERD (gastroesophageal reflux disease)    Hemorrhoids, internal, with bleeding Gr 2 prolapsed 06/30/2011   Found on colonoscopy 2011 and anoscopy May 2013    Hepatitis    "B"    no rx >10 yrs   Hiatal hernia    High cholesterol    Hypertension    Internal hemorrhoids    OSA (obstructive sleep apnea)    does not use CPAP   Pre-diabetes    PUD (peptic ulcer disease)    Rectal ulceration 04/27/2009   Associated reactive/regenerative changes and fibromuscular extensions into lamina propria/Mucosal Prolapse   Sleep apnea    Waiting on CPAP machine    Past Surgical History:  Procedure Laterality Date   ABDOMINAL HYSTERECTOMY     CATARACT EXTRACTION Right 03/2018   COLONOSCOPY  04/27/09    POLYPOID FRAGMENT OF COLONIC MUCOSA WITH SURFACE   EVALUATION UNDER ANESTHESIA WITH HEMORRHOIDECTOMY N/A 03/21/2019   Procedure: OPEN HEMORRHOIDECTOMY;  Surgeon: Darnell Level, MD;  Location: [facility];  Service: General;  Laterality: N/A;   HEMORRHOID BANDING     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 She has never used smokeless tobacco. She reports that she does not drink alcohol and does not use drugs.  Allergies: Allergies  Allergen Reactions   Ciprofloxacin Hives and Nausea Only   Flagyl [Metronidazole] Hives and Nausea Only    Family History:  Family History  Problem Relation Age of Onset   Diabetes Sister    Asthma Sister    Hyperlipidemia Sister    Hypertension Sister    Colon cancer Father 61   Cancer Father    Arthritis Mother    Hypertension Mother    Depression Sister    Mental illness Sister    Esophageal cancer Neg Hx    Pancreatic cancer Neg Hx    Stomach cancer Neg Hx      Current Outpatient Medications:    albuterol (VENTOLIN HFA) 108 (90 Base) MCG/ACT inhaler, Inhale 1-2 puffs into the lungs every 6 (six) hours as needed for wheezing or shortness of breath., Disp: 18 g, Rfl: 0   Ascorbic Acid (VITAMIN C) 1000 MG tablet, Take 1,000 mg by mouth daily., Disp: , Rfl:    atorvastatin (LIPITOR) 20 MG tablet, Take 1 tablet (20 mg total) by mouth daily., Disp: 90 tablet, Rfl: 1   benazepril (LOTENSIN) 40 MG tablet, TAKE 1 TABLET BY MOUTH ONCE DAILY ., Disp: 90 tablet, Rfl: 1   cetirizine (  ZYRTEC) 10 MG tablet, Take 10 mg by mouth daily., Disp: , Rfl:    cholecalciferol (VITAMIN D3) 25 MCG (1000 UNIT) tablet, Take 1,000 Units by mouth daily. Unsure of dosage , Disp: , Rfl:    Multiple Vitamins-Minerals (MULTIVITAMIN WITH MINERALS) tablet, Take 1 tablet by mouth daily., Disp: , Rfl:    omega-3 fish oil (MAXEPA) 1000 MG CAPS capsule, Take 1 mg by mouth., Disp: , Rfl:    pantoprazole (PROTONIX) 40 MG tablet, TAKE 1 TABLET BY MOUTH TWICE DAILY ABOUT  30  MINUTES  BEFORE  BREAKFAST  AND  SUPPER, Disp: 180 tablet, Rfl: 1   Polyethylene Glycol 3350 (MIRALAX PO), Take by mouth as needed., Disp: , Rfl:    Vitamin D, Ergocalciferol, (DRISDOL) 1.25 MG (50000 UNIT) CAPS capsule, Take 1 capsule (50,000 Units total) by mouth every 7 (seven) days  for 12 doses., Disp: 12 capsule, Rfl: 0   amoxicillin-clavulanate (AUGMENTIN) 875-125 MG tablet, Take 1 tablet by mouth 2 (two) times daily. (Patient not taking: Reported on 03/01/2023), Disp: 14 tablet, Rfl: 0   benzonatate (TESSALON) 100 MG capsule, Take 1 capsule (100 mg total) by mouth 3 (three) times daily as needed for cough. (Patient not taking: Reported on 03/01/2023), Disp: 30 capsule, Rfl: 0  Review of Systems:  Negative unless indicated in HPI.   Physical Exam: Vitals:   03/01/23 1554  BP: 112/81  Pulse: 95  Resp: 16  Temp: 97.8 F (36.6 C)  TempSrc: Oral  SpO2: 96%  Weight: 208 lb 4 oz (94.5 kg)  Height: 5' 0.75" (1.543 m)    Body mass index is 39.67 kg/m.   Physical Exam Vitals reviewed.  Constitutional:      Appearance: Normal appearance.  HENT:     Right Ear: Tympanic membrane, ear canal and external ear normal.     Left Ear: Tympanic membrane, ear canal and external ear normal.     Mouth/Throat:     Mouth: Mucous membranes are moist.     Pharynx: Posterior oropharyngeal erythema present.  Eyes:     Conjunctiva/sclera: Conjunctivae normal.     Pupils: Pupils are equal, round, and reactive to light.  Cardiovascular:     Rate and Rhythm: Normal rate and regular rhythm.  Pulmonary:     Effort: Pulmonary effort is normal.     Breath sounds: Normal breath sounds.  Neurological:     Mental Status: She is alert.      Impression and Plan:  Flu-like symptoms  -In office flu and COVID tests are negative, however these may be false negative given how early she is in her disease course.  Have advised her to purchase an at home flu test and to retest tomorrow afternoon and if positive to notify us for potential treatment.   Time spent:21 minutes reviewing chart, interviewing and examining patient and formulating plan of care.     Chaya Jan, MD [facility] at [facility]

## 2023-03-12 ENCOUNTER — Encounter: Payer: 59 | Admitting: Obstetrics and Gynecology

## 2023-04-25 DIAGNOSIS — Z1231 Encounter for screening mammogram for malignant neoplasm of breast: Secondary | ICD-10-CM | POA: Diagnosis not present

## 2023-04-25 LAB — HM MAMMOGRAPHY

## 2023-06-12 ENCOUNTER — Other Ambulatory Visit: Payer: Self-pay | Admitting: Internal Medicine

## 2023-06-12 DIAGNOSIS — I10 Essential (primary) hypertension: Secondary | ICD-10-CM

## 2023-07-06 ENCOUNTER — Other Ambulatory Visit: Payer: Self-pay | Admitting: Internal Medicine

## 2023-07-06 DIAGNOSIS — I10 Essential (primary) hypertension: Secondary | ICD-10-CM

## 2023-07-06 DIAGNOSIS — K219 Gastro-esophageal reflux disease without esophagitis: Secondary | ICD-10-CM

## 2023-07-09 DIAGNOSIS — Z961 Presence of intraocular lens: Secondary | ICD-10-CM | POA: Diagnosis not present

## 2023-07-09 DIAGNOSIS — H26492 Other secondary cataract, left eye: Secondary | ICD-10-CM | POA: Diagnosis not present

## 2023-07-09 DIAGNOSIS — H0102A Squamous blepharitis right eye, upper and lower eyelids: Secondary | ICD-10-CM | POA: Diagnosis not present

## 2023-07-09 DIAGNOSIS — H0102B Squamous blepharitis left eye, upper and lower eyelids: Secondary | ICD-10-CM | POA: Diagnosis not present

## 2023-07-09 DIAGNOSIS — H35341 Macular cyst, hole, or pseudohole, right eye: Secondary | ICD-10-CM | POA: Diagnosis not present

## 2023-07-09 DIAGNOSIS — H1013 Acute atopic conjunctivitis, bilateral: Secondary | ICD-10-CM | POA: Diagnosis not present

## 2023-09-12 ENCOUNTER — Other Ambulatory Visit: Payer: Self-pay | Admitting: Internal Medicine

## 2023-10-08 ENCOUNTER — Other Ambulatory Visit (HOSPITAL_BASED_OUTPATIENT_CLINIC_OR_DEPARTMENT_OTHER): Payer: Self-pay

## 2023-10-10 ENCOUNTER — Encounter: Payer: Self-pay | Admitting: Internal Medicine

## 2023-10-10 ENCOUNTER — Ambulatory Visit: Admitting: Internal Medicine

## 2023-10-10 VITALS — BP 110/78 | HR 76 | Temp 97.5°F | Wt 207.9 lb

## 2023-10-10 DIAGNOSIS — R7302 Impaired glucose tolerance (oral): Secondary | ICD-10-CM

## 2023-10-10 DIAGNOSIS — I10 Essential (primary) hypertension: Secondary | ICD-10-CM

## 2023-10-10 DIAGNOSIS — G4730 Sleep apnea, unspecified: Secondary | ICD-10-CM

## 2023-10-10 DIAGNOSIS — E559 Vitamin D deficiency, unspecified: Secondary | ICD-10-CM

## 2023-10-10 DIAGNOSIS — E7800 Pure hypercholesterolemia, unspecified: Secondary | ICD-10-CM

## 2023-10-10 DIAGNOSIS — Z23 Encounter for immunization: Secondary | ICD-10-CM

## 2023-10-10 LAB — POCT GLYCOSYLATED HEMOGLOBIN (HGB A1C): Hemoglobin A1C: 5.9 % — AB (ref 4.0–5.6)

## 2023-10-10 MED ORDER — CETIRIZINE HCL 10 MG PO TABS: 10.0000 mg | ORAL_TABLET | Freq: Every day | ORAL | 1 refills | Status: DC

## 2023-10-10 MED ORDER — CETIRIZINE HCL 10 MG PO TABS: 10.0000 mg | ORAL_TABLET | Freq: Every day | ORAL | 1 refills | Status: AC

## 2023-10-10 NOTE — Assessment & Plan Note (Signed)
 Blood pressure is well-controlled on current.

## 2023-10-10 NOTE — Assessment & Plan Note (Signed)
Check levels next visit.

## 2023-10-10 NOTE — Assessment & Plan Note (Signed)
 Suspect she has sleep apnea based on symptoms, will refer to neurology for sleep study.

## 2023-10-10 NOTE — Assessment & Plan Note (Signed)
 Discussed healthy lifestyle, including increased physical activity and better food choices to promote weight loss.

## 2023-10-10 NOTE — Assessment & Plan Note (Signed)
 Last LDL 76, continue atorvastatin  20 mg daily.

## 2023-10-10 NOTE — Assessment & Plan Note (Signed)
 A1c is stable to slightly improved at 5.9, still in the pre-diabetic range.  Continue to work on lifestyle changes.

## 2023-10-10 NOTE — Progress Notes (Signed)
 Established Patient Office Visit     CC/Reason for Visit: Follow-up chronic medical conditions  HPI: Julie Dougherty is a 73 y.o. female who is coming in today for the above mentioned reasons. Past Medical History is significant for: Hypertension, hyperlipidemia, morbid obesity, impaired glucose tolerance, GERD, vitamin D  deficiency.  She is having difficulty sleeping with frequent nighttime awakenings and daytime sleeping.  She has been told that she snores significantly.  Is requesting a flu vaccine.  Is also requesting a refill of cetirizine  that she takes for her seasonal allergies.   Past Medical/Surgical History: Past Medical History:  Diagnosis Date   Arthritis    Cataract    Chronic constipation    Diverticulitis    Diverticulosis    Fatty liver    GERD (gastroesophageal reflux disease)    Hemorrhoids, internal, with bleeding Gr 2 prolapsed 06/30/2011   Found on colonoscopy 2011 and anoscopy May 2013    Hepatitis    B    no rx >10 yrs   Hiatal hernia    High cholesterol    Hypertension    Internal hemorrhoids    OSA (obstructive sleep apnea)    does not use CPAP   Pre-diabetes    PUD (peptic ulcer disease)    Rectal ulceration 04/27/2009   Associated reactive/regenerative changes and fibromuscular extensions into lamina propria/Mucosal Prolapse   Sleep apnea    Waiting on CPAP machine    Past Surgical History:  Procedure Laterality Date   ABDOMINAL HYSTERECTOMY     CATARACT EXTRACTION Right 03/2018   COLONOSCOPY  04/27/09    POLYPOID FRAGMENT OF COLONIC MUCOSA WITH SURFACE   EVALUATION UNDER ANESTHESIA WITH HEMORRHOIDECTOMY N/A 03/21/2019   Procedure: OPEN HEMORRHOIDECTOMY;  Surgeon: Eletha Boas, MD;  Location: [facility];  Service: General;  Laterality: N/A;   HEMORRHOID BANDING     TOTAL KNEE ARTHROPLASTY Left 08/29/2016   Procedure: LEFT TOTAL KNEE ARTHROPLASTY;  Surgeon: Vernetta Lonni GRADE, MD;  Location: [facility];   Service: Orthopedics;  Laterality: Left;   TOTAL KNEE ARTHROPLASTY Right 03/06/2017   Procedure: RIGHT TOTAL KNEE ARTHROPLASTY;  Surgeon: Vernetta Lonni GRADE, MD;  Location: [facility];  Service: Orthopedics;  Laterality: Right;   TUBAL LIGATION      Social History:  reports that she has never smoked. She has never used smokeless tobacco. She reports that she does not drink alcohol and does not use drugs.  Allergies: Allergies  Allergen Reactions   Ciprofloxacin  Hives and Nausea Only   Flagyl  [Metronidazole ] Hives and Nausea Only    Family History:  Family History  Problem Relation Age of Onset   Diabetes Sister    Asthma Sister    Hyperlipidemia Sister    Hypertension Sister    Colon cancer Father 41   Cancer Father    Arthritis Mother    Hypertension Mother    Depression Sister    Mental illness Sister    Esophageal cancer Neg Hx    Pancreatic cancer Neg Hx    Stomach cancer Neg Hx      Current Outpatient Medications:    albuterol  (VENTOLIN  HFA) 108 (90 Base) MCG/ACT inhaler, Inhale 1-2 puffs into the lungs every 6 (six) hours as needed for wheezing or shortness of breath., Disp: 18 g, Rfl: 0   amoxicillin -clavulanate (AUGMENTIN ) 875-125 MG tablet, Take 1 tablet by mouth 2 (two) times daily., Disp: 14 tablet, Rfl: 0   Ascorbic Acid  (VITAMIN C ) 1000 MG tablet,  Take 1,000 mg by mouth daily., Disp: , Rfl:    atorvastatin  (LIPITOR) 20 MG tablet, Take 1 tablet by mouth once daily, Disp: 90 tablet, Rfl: 0   benazepril  (LOTENSIN ) 40 MG tablet, Take 1 tablet by mouth once daily, Disp: 90 tablet, Rfl: 0   benzonatate  (TESSALON ) 100 MG capsule, Take 1 capsule (100 mg total) by mouth 3 (three) times daily as needed for cough., Disp: 30 capsule, Rfl: 0   cholecalciferol (VITAMIN D3) 25 MCG (1000 UNIT) tablet, Take 1,000 Units by mouth daily. Unsure of dosage , Disp: , Rfl:    Multiple Vitamins-Minerals (MULTIVITAMIN WITH MINERALS) tablet, Take 1 tablet by mouth daily., Disp: , Rfl:     omega-3 fish oil (MAXEPA) 1000 MG CAPS capsule, Take 1 mg by mouth., Disp: , Rfl:    pantoprazole  (PROTONIX ) 40 MG tablet, TAKE 1 TABLET BY MOUTH TWICE DAILY 30 MINUTES BEFORE BREAKFAST AND SUPPER, Disp: 180 tablet, Rfl: 0   Polyethylene Glycol 3350  (MIRALAX  PO), Take by mouth as needed., Disp: , Rfl:    cetirizine  (ZYRTEC ) 10 MG tablet, Take 1 tablet (10 mg total) by mouth daily., Disp: 90 tablet, Rfl: 1  Review of Systems:  Negative unless indicated in HPI.   Physical Exam: Vitals:   10/10/23 1131  BP: 110/78  Pulse: 76  Temp: (!) 97.5 F (36.4 C)  TempSrc: Oral  SpO2: 98%  Weight: 207 lb 14.4 oz (94.3 kg)    Body mass index is 39.61 kg/m.   Physical Exam   Impression and Plan:  IGT (impaired glucose tolerance) Assessment & Plan: A1c is stable to slightly improved at 5.9, still in the pre-diabetic range.  Continue to work on lifestyle changes.  Orders: -     POCT glycosylated hemoglobin (Hb A1C)  Essential hypertension Assessment & Plan: Blood pressure is well-controlled on current.   Elevated cholesterol Assessment & Plan: Last LDL 76, continue atorvastatin  20 mg daily.   Sleep apnea, unspecified type Assessment & Plan: Suspect she has sleep apnea based on symptoms, will refer to neurology for sleep study.  Orders: -     Ambulatory referral to Neurology  Vitamin D  deficiency Assessment & Plan: Check levels next visit.   Morbid obesity (HCC) Assessment & Plan: -Discussed healthy lifestyle, including increased physical activity and better food choices to promote weight loss.    Immunization due -     Flu vaccine HIGH DOSE PF(Fluzone Trivalent)  Other orders -     Cetirizine  HCl; Take 1 tablet (10 mg total) by mouth daily.  Dispense: 90 tablet; Refill: 1     Time spent:32 minutes reviewing chart, interviewing and examining patient and formulating plan of care.     Tully Theophilus Andrews, MD [facility]

## 2023-10-31 ENCOUNTER — Encounter: Payer: Self-pay | Admitting: Neurology

## 2023-10-31 ENCOUNTER — Ambulatory Visit: Admitting: Neurology

## 2023-10-31 VITALS — BP 120/76 | HR 88 | Ht 60.0 in | Wt 210.0 lb

## 2023-10-31 DIAGNOSIS — G4733 Obstructive sleep apnea (adult) (pediatric): Secondary | ICD-10-CM | POA: Diagnosis not present

## 2023-10-31 DIAGNOSIS — G4700 Insomnia, unspecified: Secondary | ICD-10-CM

## 2023-10-31 DIAGNOSIS — R351 Nocturia: Secondary | ICD-10-CM | POA: Diagnosis not present

## 2023-10-31 DIAGNOSIS — G4719 Other hypersomnia: Secondary | ICD-10-CM

## 2023-10-31 DIAGNOSIS — R519 Headache, unspecified: Secondary | ICD-10-CM | POA: Diagnosis not present

## 2023-10-31 NOTE — Patient Instructions (Signed)

## 2023-10-31 NOTE — Progress Notes (Signed)
 Subjective:    Patient ID: Julie Dougherty is a 73 y.o. female.  HPI   True Mar, MD, PhD [facility] [address], [address] [address] [place], [place] [address]  Dear Dr. Theophilus Andrews,  I saw your patient, Julie Dougherty, upon your kind request in my sleep clinic today for initial consultation of her sleep disorder, in particular, evaluation of her prior diagnosis of obstructive sleep apnea.  The patient is accompanied by her son and a Spanish interpreter today.  As you know, [name] is a 73 year old female with an underlying medical history of allergies, impaired glucose tolerance, cataracts, reflux disease, hypertension, vitamin D  deficiency, chronic constipation, diverticulitis, fatty liver, history of hepatitis, hyperlipidemia, and obesity, who was previously diagnosed with obstructive sleep apnea.  She has not been on PAP therapy.  Her Epworth sleepiness score is 3 out of 24.  She reports snoring and sleep disruption as well as difficulty going to sleep and staying asleep.  She has significant nocturia about 3 times per average night and has woken up in the middle of the night with neck pain and also headaches and has had morning headaches and neck pain as well.  She takes blood pressure medication and acid reflux medication, reports still having ongoing issues with acid reflux and swallowing difficulty.  She lives alone.  She quit smoking over 30 years ago, does not drink any alcohol, limits her caffeine to about 2 cups of coffee in the mornings.  She has 2 birds in the household.  She goes to bed around 11 but watches TV in her bed until around midnight.   As she recalls she was recommended to have CPAP treatment in the past but never got a machine. I reviewed your office note from 10/10/2023.  She had a sleep study at [facility] on 04/04/2023 and I was able to review her test results in her electronic  chart.  She slept poorly, sleep efficiency was only 34.1%, sleep latency 94.5 minutes and REM sleep was markedly delayed.  Total AHI was 12/h, O2 nadir 64% during REM sleep.    She would be willing to get retested and consider treatment with a CPAP machine.  I was able to show her a model.  Her Past Medical History Is Significant For: Past Medical History:  Diagnosis Date   Arthritis    Cataract    Chronic constipation    Diverticulitis    Diverticulosis    Fatty liver    GERD (gastroesophageal reflux disease)    Hemorrhoids, internal, with bleeding Gr 2 prolapsed 06/30/2011   Found on colonoscopy 2011 and anoscopy May 2013    Hepatitis    B    no rx >10 yrs   Hiatal hernia    High cholesterol    Hypertension    Internal hemorrhoids    OSA (obstructive sleep apnea)    does not use CPAP   Pre-diabetes    PUD (peptic ulcer disease)    Rectal ulceration 04/27/2009   Associated reactive/regenerative changes and fibromuscular extensions into lamina propria/Mucosal Prolapse   Sleep apnea    Waiting on CPAP machine    Her Past Surgical History Is Significant For: Past Surgical History:  Procedure Laterality Date   ABDOMINAL HYSTERECTOMY     CATARACT EXTRACTION Right 03/2018   COLONOSCOPY  04/27/09    POLYPOID FRAGMENT OF COLONIC MUCOSA WITH SURFACE   EVALUATION UNDER ANESTHESIA WITH HEMORRHOIDECTOMY  N/A 03/21/2019   Procedure: OPEN HEMORRHOIDECTOMY;  Surgeon: Eletha Boas, MD;  Location: [facility];  Service: General;  Laterality: N/A;   HEMORRHOID BANDING     TOTAL KNEE ARTHROPLASTY Left 08/29/2016   Procedure: LEFT TOTAL KNEE ARTHROPLASTY;  Surgeon: Vernetta Lonni GRADE, MD;  Location: [facility];  Service: Orthopedics;  Laterality: Left;   TOTAL KNEE ARTHROPLASTY Right 03/06/2017   Procedure: RIGHT TOTAL KNEE ARTHROPLASTY;  Surgeon: Vernetta Lonni GRADE, MD;  Location: [facility];  Service: Orthopedics;  Laterality: Right;   TUBAL LIGATION      Her Family History  Is Significant For: Family History  Problem Relation Age of Onset   Arthritis Mother    Hypertension Mother    Colon cancer Father 4   Cancer Father    Diabetes Sister    Asthma Sister    Hyperlipidemia Sister    Hypertension Sister    Depression Sister    Mental illness Sister    Esophageal cancer Neg Hx    Pancreatic cancer Neg Hx    Stomach cancer Neg Hx    Sleep apnea Neg Hx     Her Social History Is Significant For: Social History   Socioeconomic History   Marital status: Single    Spouse name: Not on file   Number of children: 3   Years of education: Not on file   Highest education level: Not on file  Occupational History   Not on file  Tobacco Use   Smoking status: Never   Smokeless tobacco: Never   Tobacco comments:    when she was young  Advertising account planner   Vaping status: Never Used  Substance and Sexual Activity   Alcohol use: No   Drug use: No   Sexual activity: Not on file  Other Topics Concern   Not on file  Social History Narrative   Pt lives alone    Pt doesn't work    Social Drivers of Corporate investment banker Strain: Low Risk  (12/14/2022)   Overall Financial Resource Strain (CARDIA)    Difficulty of Paying Living Expenses: Not hard at all  Food Insecurity: No Food Insecurity (12/14/2022)   Hunger Vital Sign    Worried About Running Out of Food in the Last Year: Never true    Ran Out of Food in the Last Year: Never true  Transportation Needs: No Transportation Needs (12/14/2022)   PRAPARE - Administrator, Civil Service (Medical): No    Lack of Transportation (Non-Medical): No  Physical Activity: Inactive (12/14/2022)   Exercise Vital Sign    Days of Exercise per Week: 0 days    Minutes of Exercise per Session: 0 min  Stress: No Stress Concern Present (12/14/2022)   [employer] of Occupational Health - Occupational Stress Questionnaire    Feeling of Stress : Not at all  Social Connections: Moderately Integrated  (12/14/2022)   Social Connection and Isolation Panel    Frequency of Communication with Friends and Family: More than three times a week    Frequency of Social Gatherings with Friends and Family: More than three times a week    Attends Religious Services: More than 4 times per year    Active Member of [employer] or Organizations: Yes    Attends Engineer, structural: More than 4 times per year    Marital Status: Separated    Her Allergies Are:  Allergies  Allergen Reactions   Ciprofloxacin  Hives and Nausea Only  Flagyl  [Metronidazole ] Hives and Nausea Only  :   Her Current Medications Are:  Outpatient Encounter Medications as of 10/31/2023  Medication Sig   Polyethylene Glycol 3350  (MIRALAX  PO) Take by mouth as needed.   albuterol  (VENTOLIN  HFA) 108 (90 Base) MCG/ACT inhaler Inhale 1-2 puffs into the lungs every 6 (six) hours as needed for wheezing or shortness of breath.   amoxicillin -clavulanate (AUGMENTIN ) 875-125 MG tablet Take 1 tablet by mouth 2 (two) times daily.   Ascorbic Acid  (VITAMIN C ) 1000 MG tablet Take 1,000 mg by mouth daily.   atorvastatin  (LIPITOR) 20 MG tablet Take 1 tablet by mouth once daily   benazepril  (LOTENSIN ) 40 MG tablet Take 1 tablet by mouth once daily   benzonatate  (TESSALON ) 100 MG capsule Take 1 capsule (100 mg total) by mouth 3 (three) times daily as needed for cough.   cetirizine  (ZYRTEC ) 10 MG tablet Take 1 tablet (10 mg total) by mouth daily.   cholecalciferol (VITAMIN D3) 25 MCG (1000 UNIT) tablet Take 1,000 Units by mouth daily. Unsure of dosage    Multiple Vitamins-Minerals (MULTIVITAMIN WITH MINERALS) tablet Take 1 tablet by mouth daily.   omega-3 fish oil (MAXEPA) 1000 MG CAPS capsule Take 1 mg by mouth.   pantoprazole  (PROTONIX ) 40 MG tablet TAKE 1 TABLET BY MOUTH TWICE DAILY 30 MINUTES BEFORE BREAKFAST AND SUPPER   No facility-administered encounter medications on file as of 10/31/2023.  :   Review of Systems:  Out of a complete 14  point review of systems, all are reviewed and negative with the exception of these symptoms as listed below:   Review of Systems  Neurological:        Pt here for sleep consult   Pt snores,headaches,fatigue,hypertension Pt denies sleep study.pap machine Pt had sleep study 7 years ago     Objective:  Neurological Exam  Physical Exam Physical Examination:   Vitals:   10/31/23 0928  BP: 120/76  Pulse: 88    General Examination: The patient is a very pleasant 73 y.o. female in no acute distress. She appears well-developed and well-nourished and well groomed.   HEENT: Normocephalic, atraumatic, pupils are equal, round and reactive to light, extraocular tracking is good without limitation to gaze excursion or nystagmus noted. Hearing is grossly intact. Face is symmetric with normal facial animation. Speech is clear with no dysarthria noted. There is no hypophonia. There is no lip, neck/head, jaw or voice tremor. Neck is supple with full range of passive and active motion. There are no carotid bruits on auscultation. Oropharynx exam reveals: mild mouth dryness, adequate dental hygiene with fixed partial dentures on top and removable partials on the bottom, significant airway crowding secondary to thicker soft palate, Mallampati class III, larger uvula, tonsils 2+ bilaterally.  Tongue protrudes centrally and palate elevates symmetrically.  Mild overbite.   Chest: Clear to auscultation without wheezing, rhonchi or crackles noted.  Heart: S1+S2+0, regular and normal without murmurs, rubs or gallops noted.   Abdomen: Soft, non-tender and non-distended.  Extremities: There is 1+ pitting edema in the distal lower extremities bilaterally.   Skin: Warm and dry without trophic changes noted.   Musculoskeletal: exam reveals no obvious joint deformities.   Neurologically:  Mental status: The patient is awake, alert and oriented in all 4 spheres. Her immediate and remote memory, attention, language  skills and fund of knowledge are appropriate. There is no evidence of aphasia, agnosia, apraxia or anomia. Speech is clear with normal prosody and enunciation. Thought process is linear.  Mood is normal and affect is normal.  Cranial nerves II - XII are as described above under HEENT exam.  Motor exam: Normal bulk, strength and tone is noted. There is no obvious action or resting tremor.  Fine motor skills and coordination: grossly intact.  Cerebellar testing: No dysmetria or intention tremor. There is no truncal or gait ataxia.  Sensory exam: intact to light touch in the upper and lower extremities.   Assessment and Plan:   In summary, Reunion Minnah Llamas is a very pleasant 73 y.o.-year old female with an underlying medical history of allergies, impaired glucose tolerance, cataracts, reflux disease, hypertension, vitamin D  deficiency, chronic constipation, diverticulitis, fatty liver, history of hepatitis, hyperlipidemia, and obesity, who presents for evaluation of her prior diagnosis of obstructive sleep apnea.  She has had some weight fluctuation.  History and examination are definitely concerning for ongoing sleep disordered breathing.  A laboratory attended sleep study is typically considered gold standard for evaluation of sleep disordered breathing.   I had a long chat with the patient and her son about my findings and the diagnosis of sleep apnea, particularly OSA, its prognosis and treatment options. We talked about medical/conservative treatments, surgical interventions and non-pharmacological approaches for symptom control. I explained, in particular, the risks and ramifications of untreated moderate to severe OSA, especially with respect to developing cardiovascular disease down the road, including congestive heart failure (CHF), difficult to treat hypertension, cardiac arrhythmias (particularly A-fib), neurovascular complications including TIA, stroke and dementia. Even type 2 diabetes  has, in part, been linked to untreated OSA. Symptoms of untreated OSA may include (but may not be limited to) daytime sleepiness, nocturia (i.e. frequent nighttime urination), memory problems, mood irritability and suboptimally controlled or worsening mood disorder such as depression and/or anxiety, lack of energy, lack of motivation, physical discomfort, as well as recurrent headaches, especially morning or nocturnal headaches. We talked about the importance of maintaining a healthy lifestyle and striving for healthy weight. In addition, we talked about the importance of striving for and maintaining good sleep hygiene. I recommended a sleep study at this time. I outlined the differences between a laboratory attended sleep study which is considered more comprehensive and accurate over the option of a home sleep test (HST); the latter may lead to underestimation of sleep disordered breathing in some instances and does not help with diagnosing upper airway resistance syndrome and is not accurate enough to diagnose primary central sleep apnea typically. I outlined possible surgical and non-surgical treatment options of OSA, including the use of a positive airway pressure (PAP) device (i.e. CPAP, AutoPAP/APAP or BiPAP in certain circumstances), a custom-made dental device (aka oral appliance, which would require a referral to a specialist dentist or orthodontist typically, and is generally speaking not considered for patients with full dentures or edentulous state), upper airway surgical options, such as traditional UPPP (which is not considered a first-line treatment) or the Inspire device (hypoglossal nerve stimulator, which would involve a referral for consultation with an ENT surgeon, after careful selection, following inclusion criteria - also not first-line treatment). I explained the PAP treatment option to the patient in detail, as this is generally considered first-line treatment.  The patient indicated that  she would be willing to try PAP therapy, if the need arises. I explained the importance of being compliant with PAP treatment, not only for insurance purposes but primarily to improve patient's symptoms symptoms, and for the patient's long term health benefit, including to reduce Her cardiovascular risks longer-term.  We will pick up our discussion about the next steps and treatment options after testing.  We will keep her posted as to the test results by phone call and/or MyChart messaging where possible.  We will plan to follow-up in sleep clinic accordingly as well.  I answered all their questions today and the patient and her son were in agreement.   I encouraged them to call with any interim questions, concerns, problems or updates or email us  through MyChart.  Generally speaking, sleep test authorizations may take up to 2 weeks, sometimes less, sometimes longer, the patient is encouraged to get in touch with us  if they do not hear back from the sleep lab staff directly within the next 2 weeks.  Thank you very much for allowing me to participate in the care of this nice patient. If I can be of any further assistance to you please do not hesitate to call me at [phone].  Sincerely,   True Mar, MD, PhD

## 2023-12-03 ENCOUNTER — Encounter: Payer: Self-pay | Admitting: Radiology

## 2023-12-06 ENCOUNTER — Other Ambulatory Visit: Payer: Self-pay | Admitting: Internal Medicine

## 2023-12-06 DIAGNOSIS — I10 Essential (primary) hypertension: Secondary | ICD-10-CM

## 2023-12-07 ENCOUNTER — Other Ambulatory Visit: Payer: Self-pay | Admitting: Internal Medicine

## 2023-12-07 DIAGNOSIS — K219 Gastro-esophageal reflux disease without esophagitis: Secondary | ICD-10-CM

## 2023-12-17 ENCOUNTER — Ambulatory Visit (INDEPENDENT_AMBULATORY_CARE_PROVIDER_SITE_OTHER): Admitting: Neurology

## 2023-12-17 DIAGNOSIS — G4761 Periodic limb movement disorder: Secondary | ICD-10-CM

## 2023-12-17 DIAGNOSIS — G4720 Circadian rhythm sleep disorder, unspecified type: Secondary | ICD-10-CM

## 2023-12-17 DIAGNOSIS — G4733 Obstructive sleep apnea (adult) (pediatric): Secondary | ICD-10-CM | POA: Diagnosis not present

## 2023-12-17 DIAGNOSIS — R519 Headache, unspecified: Secondary | ICD-10-CM

## 2023-12-17 DIAGNOSIS — G4719 Other hypersomnia: Secondary | ICD-10-CM

## 2023-12-17 DIAGNOSIS — G4700 Insomnia, unspecified: Secondary | ICD-10-CM

## 2023-12-17 DIAGNOSIS — R351 Nocturia: Secondary | ICD-10-CM

## 2023-12-20 NOTE — Procedures (Signed)
 Physician Interpretation: Please see link under Procedure Tab or under Encounters tab for physician report, technical report, as well as O2 titration and/or PAP titration tables (if applicable).

## 2023-12-23 ENCOUNTER — Other Ambulatory Visit: Payer: Self-pay | Admitting: Internal Medicine

## 2023-12-25 ENCOUNTER — Ambulatory Visit: Payer: Self-pay | Admitting: Neurology

## 2023-12-25 DIAGNOSIS — G4733 Obstructive sleep apnea (adult) (pediatric): Secondary | ICD-10-CM

## 2024-01-02 ENCOUNTER — Ambulatory Visit: Admitting: Internal Medicine

## 2024-01-02 DIAGNOSIS — R7302 Impaired glucose tolerance (oral): Secondary | ICD-10-CM

## 2024-01-07 ENCOUNTER — Encounter: Payer: Self-pay | Admitting: Internal Medicine

## 2024-01-07 ENCOUNTER — Ambulatory Visit: Admitting: Internal Medicine

## 2024-01-07 VITALS — BP 128/88 | HR 79 | Temp 97.9°F | Wt 214.1 lb

## 2024-01-07 DIAGNOSIS — E782 Mixed hyperlipidemia: Secondary | ICD-10-CM | POA: Insufficient documentation

## 2024-01-07 DIAGNOSIS — R7302 Impaired glucose tolerance (oral): Secondary | ICD-10-CM

## 2024-01-07 DIAGNOSIS — I10 Essential (primary) hypertension: Secondary | ICD-10-CM

## 2024-01-07 DIAGNOSIS — E559 Vitamin D deficiency, unspecified: Secondary | ICD-10-CM

## 2024-01-07 LAB — LIPID PANEL
Cholesterol: 142 mg/dL (ref 0–200)
HDL: 47.8 mg/dL (ref >39.00)
LDL Cholesterol: 53 mg/dL (ref 0–99)
NonHDL: 93.85
Total CHOL/HDL Ratio: 3
Triglycerides: 202 mg/dL — ABNORMAL HIGH (ref 0.0–149.0)
VLDL: 40.4 mg/dL — ABNORMAL HIGH (ref 0.0–40.0)

## 2024-01-07 LAB — POCT GLYCOSYLATED HEMOGLOBIN (HGB A1C): Hemoglobin A1C: 6 % — AB (ref 4.0–5.6)

## 2024-01-07 LAB — CBC WITH DIFFERENTIAL/PLATELET
Basophils Absolute: 0.1 K/uL (ref 0.0–0.1)
Basophils Relative: 1.9 % (ref 0.0–3.0)
Eosinophils Absolute: 0.1 K/uL (ref 0.0–0.7)
Eosinophils Relative: 1.3 % (ref 0.0–5.0)
HCT: 43.2 % (ref 36.0–46.0)
Hemoglobin: 13.9 g/dL (ref 12.0–15.0)
Lymphocytes Relative: 40.4 % (ref 12.0–46.0)
Lymphs Abs: 2.5 K/uL (ref 0.7–4.0)
MCHC: 32.2 g/dL (ref 30.0–36.0)
MCV: 89.5 fl (ref 78.0–100.0)
Monocytes Absolute: 0.5 K/uL (ref 0.1–1.0)
Monocytes Relative: 7.5 % (ref 3.0–12.0)
Neutro Abs: 3 K/uL (ref 1.4–7.7)
Neutrophils Relative %: 48.9 % (ref 43.0–77.0)
Platelets: 229 K/uL (ref 150.0–400.0)
RBC: 4.82 Mil/uL (ref 3.87–5.11)
RDW: 13.7 % (ref 11.5–15.5)
WBC: 6.2 K/uL (ref 4.0–10.5)

## 2024-01-07 LAB — COMPREHENSIVE METABOLIC PANEL WITH GFR
ALT: 20 U/L (ref 0–35)
AST: 19 U/L (ref 0–37)
Albumin: 4.2 g/dL (ref 3.5–5.2)
Alkaline Phosphatase: 64 U/L (ref 39–117)
BUN: 14 mg/dL (ref 6–23)
CO2: 27 meq/L (ref 19–32)
Calcium: 9.5 mg/dL (ref 8.4–10.5)
Chloride: 102 meq/L (ref 96–112)
Creatinine, Ser: 0.87 mg/dL (ref 0.40–1.20)
GFR: 66.4 mL/min (ref >60.00)
Glucose, Bld: 89 mg/dL (ref 70–99)
Potassium: 4.8 meq/L (ref 3.5–5.1)
Sodium: 140 meq/L (ref 135–145)
Total Bilirubin: 0.4 mg/dL (ref 0.2–1.2)
Total Protein: 7.8 g/dL (ref 6.0–8.3)

## 2024-01-07 LAB — TSH: TSH: 1.42 u[IU]/mL (ref 0.35–5.50)

## 2024-01-07 LAB — VITAMIN D 25 HYDROXY (VIT D DEFICIENCY, FRACTURES): VITD: 31.54 ng/mL (ref 30.00–100.00)

## 2024-01-07 LAB — VITAMIN B12: Vitamin B-12: 324 pg/mL (ref 211–911)

## 2024-01-07 NOTE — Assessment & Plan Note (Signed)
 Discussed healthy lifestyle, including increased physical activity and better food choices to promote weight loss.

## 2024-01-07 NOTE — Assessment & Plan Note (Signed)
 Most recent lipid panel with an LDL of 76.  Recheck today.

## 2024-01-07 NOTE — Assessment & Plan Note (Signed)
 Check levels today.

## 2024-01-07 NOTE — Assessment & Plan Note (Signed)
 A1c is stable at 6.0, still in the prediabetic range.  Continue to work on lifestyle changes.

## 2024-01-07 NOTE — Assessment & Plan Note (Signed)
 Blood pressure is well-controlled on current.

## 2024-01-07 NOTE — Progress Notes (Signed)
 Established Patient Office Visit     CC/Reason for Visit: Follow-up chronic conditions  HPI: Julie Dougherty is a 73 y.o. female who is coming in today for the above mentioned reasons. Past Medical History is significant for: Morbid obesity, hypertension, hyperlipidemia, impaired glucose tolerance, recently diagnosed OSA, getting fitted for CPAP, GERD, vitamin D  deficiency.  She is otherwise feeling well.   Past Medical/Surgical History: Past Medical History:  Diagnosis Date   Arthritis    Cataract    Chronic constipation    Diverticulitis    Diverticulosis    Fatty liver    GERD (gastroesophageal reflux disease)    Hemorrhoids, internal, with bleeding Gr 2 prolapsed 06/30/2011   Found on colonoscopy 2011 and anoscopy May 2013    Hepatitis    B    no rx >10 yrs   Hiatal hernia    High cholesterol    Hypertension    Internal hemorrhoids    OSA (obstructive sleep apnea)    does not use CPAP   Pre-diabetes    PUD (peptic ulcer disease)    Rectal ulceration 04/27/2009   Associated reactive/regenerative changes and fibromuscular extensions into lamina propria/Mucosal Prolapse   Sleep apnea    Waiting on CPAP machine    Past Surgical History:  Procedure Laterality Date   ABDOMINAL HYSTERECTOMY     CATARACT EXTRACTION Right 03/2018   COLONOSCOPY  04/27/09    POLYPOID FRAGMENT OF COLONIC MUCOSA WITH SURFACE   EVALUATION UNDER ANESTHESIA WITH HEMORRHOIDECTOMY N/A 03/21/2019   Procedure: OPEN HEMORRHOIDECTOMY;  Surgeon: Eletha Boas, MD;  Location: [facility];  Service: General;  Laterality: N/A;   HEMORRHOID BANDING     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 She has never used smokeless tobacco. She reports that she does not drink alcohol and does not use drugs.  Allergies: Allergies  Allergen Reactions   Ciprofloxacin  Hives and Nausea Only   Flagyl  [Metronidazole ] Hives and Nausea Only    Family History:  Family History  Problem Relation Age of Onset   Arthritis Mother    Hypertension Mother    Colon cancer Father 20   Cancer Father    Diabetes Sister    Asthma Sister    Hyperlipidemia Sister    Hypertension Sister    Depression Sister    Mental illness Sister    Esophageal cancer Neg Hx    Pancreatic cancer Neg Hx    Stomach cancer Neg Hx    Sleep apnea Neg Hx      Current Outpatient Medications:    albuterol  (VENTOLIN  HFA) 108 (90 Base) MCG/ACT inhaler, Inhale 1-2 puffs into the lungs every 6 (six) hours as needed for wheezing or shortness of breath., Disp: 18 g, Rfl: 0   amoxicillin -clavulanate (AUGMENTIN ) 875-125 MG tablet, Take 1 tablet by mouth 2 (two) times daily., Disp: 14 tablet, Rfl: 0   atorvastatin  (LIPITOR) 20 MG tablet, Take 1 tablet by mouth once daily, Disp: 90 tablet, Rfl: 0   benazepril  (LOTENSIN ) 40 MG tablet, Take 1 tablet by mouth once daily,  Disp: 90 tablet, Rfl: 0   cetirizine  (ZYRTEC ) 10 MG tablet, Take 1 tablet (10 mg total) by mouth daily., Disp: 90 tablet, Rfl: 1   cholecalciferol (VITAMIN D3) 25 MCG (1000 UNIT) tablet, Take 1,000 Units by mouth daily. Unsure of dosage , Disp: , Rfl:    Multiple Vitamins-Minerals (MULTIVITAMIN WITH MINERALS) tablet, Take 1 tablet by mouth daily., Disp: , Rfl:    omega-3 fish oil (MAXEPA) 1000 MG CAPS capsule, Take 1 mg by mouth., Disp: , Rfl:    pantoprazole  (PROTONIX ) 40 MG tablet, TAKE 1 TABLET BY MOUTH TWICE DAILY 30  MINUTES  BEFORE  BREAKFASR  AND  SUPPER, Disp: 180 tablet, Rfl: 0   Polyethylene Glycol 3350  (MIRALAX  PO), Take by mouth as needed.,  Disp: , Rfl:    Ascorbic Acid  (VITAMIN C ) 1000 MG tablet, Take 1,000 mg by mouth daily. (Patient not taking: Reported on 01/07/2024), Disp: , Rfl:    benzonatate  (TESSALON ) 100 MG capsule, Take 1 capsule (100 mg total) by mouth 3 (three) times daily as needed for cough. (Patient not taking: Reported on 01/07/2024), Disp: 30 capsule, Rfl: 0  Review of Systems:  Negative unless indicated in HPI.   Physical Exam: Vitals:   01/07/24 1258  BP: 128/88  Pulse: 79  Temp: 97.9 F (36.6 C)  SpO2: 95%  Weight: 214 lb 1.6 oz (97.1 kg)    Body mass index is 41.81 kg/m.   Physical Exam Vitals reviewed.  Constitutional:      Appearance: Normal appearance. She is obese.  HENT:     Head: Normocephalic and atraumatic.  Eyes:     Conjunctiva/sclera: Conjunctivae normal.  Cardiovascular:     Rate and Rhythm: Normal rate and regular rhythm.  Pulmonary:     Effort: Pulmonary effort is normal.     Breath sounds: Normal breath sounds.  Skin:    General: Skin is warm and dry.  Neurological:     General: No focal deficit present.     Mental Status: She is alert and oriented to person, place, and time.  Psychiatric:        Mood and Affect: Mood normal.        Behavior: Behavior normal.        Thought Content: Thought content normal.        Judgment: Judgment normal.      Impression and Plan:  IGT (impaired glucose tolerance) Assessment & Plan: A1c is stable at 6.0, still in the pre-diabetic range.  Continue to work on lifestyle changes.  Orders: -     POCT glycosylated hemoglobin (Hb A1C)  Vitamin D  deficiency Assessment & Plan: Check levels today.  Orders: -     VITAMIN D  25 Hydroxy (Vit-D Deficiency, Fractures); Future  Morbid obesity (HCC) Assessment & Plan: -Discussed healthy lifestyle, including increased physical activity and better food choices to promote weight loss.   Orders: -     Vitamin B12; Future -     TSH; Future  Essential hypertension Assessment &  Plan: Blood pressure is well-controlled on current.  Orders: -     CBC with Differential/Platelet; Future -     Comprehensive metabolic panel with GFR; Future  Mixed hyperlipidemia Assessment & Plan: Most recent lipid panel with an LDL of 76.  Recheck today.  Orders: -     Lipid panel; Future     Time spent:31 minutes reviewing chart, interviewing and examining patient and formulating plan of care.     Tully Theophilus Andrews, MD [place]  [facility] at [facility]

## 2024-01-09 ENCOUNTER — Ambulatory Visit: Payer: Self-pay | Admitting: Internal Medicine

## 2024-01-29 ENCOUNTER — Ambulatory Visit: Admitting: Family Medicine

## 2024-03-04 ENCOUNTER — Ambulatory Visit (INDEPENDENT_AMBULATORY_CARE_PROVIDER_SITE_OTHER)

## 2024-03-04 ENCOUNTER — Ambulatory Visit: Payer: Self-pay | Admitting: Nurse Practitioner

## 2024-03-04 ENCOUNTER — Other Ambulatory Visit: Payer: Self-pay | Admitting: Internal Medicine

## 2024-03-04 ENCOUNTER — Ambulatory Visit
Admission: EM | Admit: 2024-03-04 | Discharge: 2024-03-04 | Disposition: A | Discharge status: Home / Self Care | Source: Home / Self Care

## 2024-03-04 DIAGNOSIS — M25522 Pain in left elbow: Secondary | ICD-10-CM

## 2024-03-04 DIAGNOSIS — K219 Gastro-esophageal reflux disease without esophagitis: Secondary | ICD-10-CM

## 2024-03-04 DIAGNOSIS — I10 Essential (primary) hypertension: Secondary | ICD-10-CM

## 2024-03-04 DIAGNOSIS — M25512 Pain in left shoulder: Secondary | ICD-10-CM

## 2024-03-04 DIAGNOSIS — S46912A Strain of unspecified muscle, fascia and tendon at shoulder and upper arm level, left arm, initial encounter: Secondary | ICD-10-CM | POA: Diagnosis not present

## 2024-03-04 MED ORDER — LIDOCAINE 5 % EX PTCH: 1.0000 | MEDICATED_PATCH | CUTANEOUS | 0 refills | Status: AC

## 2024-03-04 MED ORDER — CYCLOBENZAPRINE HCL 5 MG PO TABS: 5.0000 mg | ORAL_TABLET | Freq: Every evening | ORAL | 0 refills | Status: AC | PRN

## 2024-03-04 NOTE — ED Triage Notes (Signed)
 Pt present with lt arm pain that radiates to the elbow.  Pt fell 8 days ago. Pt slipped on ice and fell on her lt side. Pt states she hit her elbow during the fall.  Home interventions: Ibuprofen 

## 2024-03-04 NOTE — Discharge Instructions (Signed)
 The x-ray of your shoulder and elbow were negative for any fracture.  You may use a Lidoderm  patch to the shoulder and elbow as needed.  Leave it on for 12 hours and take it off for 12 hours.  You may take Flexeril  at night as needed for muscle pain.  This will make you drowsy.  Do not drink alcohol or drive while on this medication.  Continue over-the-counter Tylenol  or ibuprofen  as needed.  Heat to the areas and follow-up with your PCP in 1 week for recheck.  Please go to the emergency room for any worsening symptoms.  I hope you feel better soon!

## 2024-04-16 ENCOUNTER — Encounter: Admitting: Internal Medicine
